# Patient Record
Sex: Female | Born: 1965
Health system: Southern US, Community
[De-identification: ages and names within clinical notes are randomized; demographics above are authoritative.]

## PROBLEM LIST (undated history)

## (undated) DIAGNOSIS — R002 Palpitations: Secondary | ICD-10-CM

## (undated) DIAGNOSIS — M419 Scoliosis, unspecified: Secondary | ICD-10-CM

## (undated) DIAGNOSIS — J302 Other seasonal allergic rhinitis: Secondary | ICD-10-CM

## (undated) DIAGNOSIS — F419 Anxiety disorder, unspecified: Secondary | ICD-10-CM

## (undated) DIAGNOSIS — D219 Benign neoplasm of connective and other soft tissue, unspecified: Secondary | ICD-10-CM

## (undated) DIAGNOSIS — D869 Sarcoidosis, unspecified: Secondary | ICD-10-CM

## (undated) DIAGNOSIS — I4510 Unspecified right bundle-branch block: Secondary | ICD-10-CM

## (undated) DIAGNOSIS — J45909 Unspecified asthma, uncomplicated: Secondary | ICD-10-CM

## (undated) DIAGNOSIS — I10 Essential (primary) hypertension: Secondary | ICD-10-CM

## (undated) DIAGNOSIS — G8929 Other chronic pain: Secondary | ICD-10-CM

## (undated) DIAGNOSIS — D573 Sickle-cell trait: Secondary | ICD-10-CM

## (undated) DIAGNOSIS — R0789 Other chest pain: Secondary | ICD-10-CM

## (undated) HISTORY — DX: Anxiety disorder, unspecified: F41.9

## (undated) HISTORY — DX: Other chest pain: R07.89

## (undated) HISTORY — PX: CHOLECYSTECTOMY: SHX55

## (undated) HISTORY — DX: Palpitations: R00.2

## (undated) HISTORY — PX: AXILLARY LYMPH NODE DISSECTION: SHX5229

## (undated) HISTORY — DX: Other chronic pain: G89.29

## (undated) HISTORY — DX: Unspecified right bundle-branch block: I45.10

---

## 2004-05-20 ENCOUNTER — Emergency Department (HOSPITAL_COMMUNITY): Admission: EM | Admit: 2004-05-20 | Discharge: 2004-05-20 | Payer: Self-pay | Admitting: Emergency Medicine

## 2004-05-23 ENCOUNTER — Ambulatory Visit: Payer: Self-pay | Admitting: Pulmonary Disease

## 2004-06-16 ENCOUNTER — Emergency Department (HOSPITAL_COMMUNITY): Admission: EM | Admit: 2004-06-16 | Discharge: 2004-06-16 | Payer: Self-pay | Admitting: Emergency Medicine

## 2004-10-30 ENCOUNTER — Emergency Department (HOSPITAL_COMMUNITY): Admission: EM | Admit: 2004-10-30 | Discharge: 2004-10-30 | Payer: Self-pay | Admitting: *Deleted

## 2004-11-27 ENCOUNTER — Encounter: Admission: RE | Admit: 2004-11-27 | Discharge: 2004-11-27 | Payer: Self-pay | Admitting: Internal Medicine

## 2005-01-05 ENCOUNTER — Emergency Department (HOSPITAL_COMMUNITY): Admission: EM | Admit: 2005-01-05 | Discharge: 2005-01-05 | Payer: Self-pay | Admitting: Emergency Medicine

## 2005-01-29 ENCOUNTER — Emergency Department (HOSPITAL_COMMUNITY): Admission: EM | Admit: 2005-01-29 | Discharge: 2005-01-29 | Payer: Self-pay | Admitting: Emergency Medicine

## 2005-07-31 ENCOUNTER — Emergency Department (HOSPITAL_COMMUNITY): Admission: EM | Admit: 2005-07-31 | Discharge: 2005-08-01 | Payer: Self-pay | Admitting: Emergency Medicine

## 2005-10-14 ENCOUNTER — Emergency Department (HOSPITAL_COMMUNITY): Admission: EM | Admit: 2005-10-14 | Discharge: 2005-10-14 | Payer: Self-pay | Admitting: Emergency Medicine

## 2006-09-09 ENCOUNTER — Emergency Department (HOSPITAL_COMMUNITY): Admission: EM | Admit: 2006-09-09 | Discharge: 2006-09-09 | Payer: Self-pay | Admitting: Emergency Medicine

## 2007-01-12 ENCOUNTER — Emergency Department (HOSPITAL_COMMUNITY): Admission: EM | Admit: 2007-01-12 | Discharge: 2007-01-12 | Payer: Self-pay | Admitting: Emergency Medicine

## 2007-05-20 ENCOUNTER — Emergency Department (HOSPITAL_COMMUNITY): Admission: EM | Admit: 2007-05-20 | Discharge: 2007-05-21 | Payer: Self-pay | Admitting: Emergency Medicine

## 2007-10-01 ENCOUNTER — Emergency Department (HOSPITAL_COMMUNITY): Admission: EM | Admit: 2007-10-01 | Discharge: 2007-10-01 | Payer: Self-pay | Admitting: Emergency Medicine

## 2007-11-30 ENCOUNTER — Encounter: Admission: RE | Admit: 2007-11-30 | Discharge: 2007-11-30 | Payer: Self-pay | Admitting: Obstetrics and Gynecology

## 2007-12-09 ENCOUNTER — Emergency Department (HOSPITAL_COMMUNITY): Admission: EM | Admit: 2007-12-09 | Discharge: 2007-12-09 | Payer: Self-pay | Admitting: Emergency Medicine

## 2007-12-09 ENCOUNTER — Ambulatory Visit (HOSPITAL_COMMUNITY): Admission: RE | Admit: 2007-12-09 | Discharge: 2007-12-09 | Payer: Self-pay | Admitting: Obstetrics and Gynecology

## 2008-10-07 ENCOUNTER — Emergency Department (HOSPITAL_COMMUNITY): Admission: EM | Admit: 2008-10-07 | Discharge: 2008-10-08 | Payer: Self-pay | Admitting: Emergency Medicine

## 2008-10-26 ENCOUNTER — Emergency Department (HOSPITAL_COMMUNITY): Admission: EM | Admit: 2008-10-26 | Discharge: 2008-10-27 | Payer: Self-pay | Admitting: Emergency Medicine

## 2008-10-28 ENCOUNTER — Emergency Department (HOSPITAL_COMMUNITY): Admission: EM | Admit: 2008-10-28 | Discharge: 2008-10-28 | Payer: Self-pay | Admitting: Emergency Medicine

## 2008-10-31 ENCOUNTER — Emergency Department (HOSPITAL_COMMUNITY): Admission: EM | Admit: 2008-10-31 | Discharge: 2008-10-31 | Payer: Self-pay | Admitting: Family Medicine

## 2008-12-07 ENCOUNTER — Encounter: Admission: RE | Admit: 2008-12-07 | Discharge: 2008-12-07 | Payer: Self-pay | Admitting: Internal Medicine

## 2009-03-30 ENCOUNTER — Emergency Department (HOSPITAL_COMMUNITY): Admission: EM | Admit: 2009-03-30 | Discharge: 2009-03-30 | Payer: Self-pay | Admitting: Emergency Medicine

## 2009-05-07 ENCOUNTER — Emergency Department (HOSPITAL_COMMUNITY): Admission: EM | Admit: 2009-05-07 | Discharge: 2009-05-07 | Payer: Self-pay | Admitting: Emergency Medicine

## 2009-05-12 ENCOUNTER — Observation Stay (HOSPITAL_COMMUNITY): Admission: EM | Admit: 2009-05-12 | Discharge: 2009-05-13 | Payer: Self-pay | Admitting: Emergency Medicine

## 2009-09-09 ENCOUNTER — Emergency Department (HOSPITAL_COMMUNITY): Admission: EM | Admit: 2009-09-09 | Discharge: 2009-09-09 | Payer: Self-pay | Admitting: Emergency Medicine

## 2009-10-03 ENCOUNTER — Emergency Department (HOSPITAL_COMMUNITY): Admission: EM | Admit: 2009-10-03 | Discharge: 2009-10-03 | Payer: Self-pay | Admitting: Emergency Medicine

## 2009-12-25 ENCOUNTER — Emergency Department (HOSPITAL_COMMUNITY): Admission: EM | Admit: 2009-12-25 | Discharge: 2009-12-25 | Payer: Self-pay | Admitting: Emergency Medicine

## 2010-01-25 ENCOUNTER — Emergency Department (HOSPITAL_COMMUNITY)
Admission: EM | Admit: 2010-01-25 | Discharge: 2010-01-25 | Payer: Self-pay | Source: Home / Self Care | Admitting: Emergency Medicine

## 2010-02-20 ENCOUNTER — Emergency Department (HOSPITAL_COMMUNITY)
Admission: EM | Admit: 2010-02-20 | Discharge: 2010-02-20 | Payer: Self-pay | Source: Home / Self Care | Admitting: Emergency Medicine

## 2010-03-03 ENCOUNTER — Encounter: Payer: Self-pay | Admitting: Internal Medicine

## 2010-03-04 ENCOUNTER — Encounter: Payer: Self-pay | Admitting: Obstetrics and Gynecology

## 2010-03-16 ENCOUNTER — Other Ambulatory Visit: Payer: Self-pay | Admitting: Internal Medicine

## 2010-03-16 DIAGNOSIS — Z1231 Encounter for screening mammogram for malignant neoplasm of breast: Secondary | ICD-10-CM

## 2010-03-20 ENCOUNTER — Ambulatory Visit: Payer: Self-pay

## 2010-03-23 ENCOUNTER — Ambulatory Visit: Payer: Self-pay

## 2010-03-26 ENCOUNTER — Ambulatory Visit: Payer: Self-pay

## 2010-04-03 ENCOUNTER — Ambulatory Visit: Payer: Self-pay

## 2010-04-24 LAB — POCT I-STAT, CHEM 8
BUN: 7 mg/dL (ref 6–23)
Calcium, Ion: 1.17 mmol/L (ref 1.12–1.32)
Chloride: 102 mEq/L (ref 96–112)
Creatinine, Ser: 0.9 mg/dL (ref 0.4–1.2)
Glucose, Bld: 80 mg/dL (ref 70–99)
HCT: 32 % — ABNORMAL LOW (ref 36.0–46.0)
Hemoglobin: 10.9 g/dL — ABNORMAL LOW (ref 12.0–15.0)
Potassium: 3.1 mEq/L — ABNORMAL LOW (ref 3.5–5.1)
Sodium: 138 mEq/L (ref 135–145)
TCO2: 25 mmol/L (ref 0–100)

## 2010-04-24 LAB — DIFFERENTIAL
Basophils Absolute: 0 10*3/uL (ref 0.0–0.1)
Basophils Relative: 0 % (ref 0–1)
Eosinophils Absolute: 0.3 10*3/uL (ref 0.0–0.7)
Eosinophils Relative: 3 % (ref 0–5)
Lymphocytes Relative: 27 % (ref 12–46)
Lymphs Abs: 2.5 10*3/uL (ref 0.7–4.0)
Monocytes Absolute: 0.7 10*3/uL (ref 0.1–1.0)
Monocytes Relative: 7 % (ref 3–12)
Neutro Abs: 5.9 10*3/uL (ref 1.7–7.7)
Neutrophils Relative %: 63 % (ref 43–77)

## 2010-04-24 LAB — CBC
HCT: 27.5 % — ABNORMAL LOW (ref 36.0–46.0)
Hemoglobin: 9.4 g/dL — ABNORMAL LOW (ref 12.0–15.0)
MCH: 22.6 pg — ABNORMAL LOW (ref 26.0–34.0)
MCHC: 34.2 g/dL (ref 30.0–36.0)
MCV: 66.1 fL — ABNORMAL LOW (ref 78.0–100.0)
Platelets: 299 10*3/uL (ref 150–400)
RBC: 4.16 MIL/uL (ref 3.87–5.11)
RDW: 16.5 % — ABNORMAL HIGH (ref 11.5–15.5)
WBC: 9.4 10*3/uL (ref 4.0–10.5)

## 2010-04-24 LAB — POCT CARDIAC MARKERS
CKMB, poc: <1 ng/mL — ABNORMAL LOW (ref 1.0–8.0)
Myoglobin, poc: 49.8 ng/mL (ref 12–200)
Troponin i, poc: <0.05 ng/mL (ref 0.00–0.09)

## 2010-04-24 LAB — D-DIMER, QUANTITATIVE: D-Dimer, Quant: 0.29 ug/mL-FEU (ref 0.00–0.48)

## 2010-04-27 LAB — URINALYSIS, ROUTINE W REFLEX MICROSCOPIC
Glucose, UA: NEGATIVE mg/dL
Hgb urine dipstick: NEGATIVE
Ketones, ur: NEGATIVE mg/dL
Nitrite: NEGATIVE
Protein, ur: NEGATIVE mg/dL
Specific Gravity, Urine: 1.023 (ref 1.005–1.030)
Urobilinogen, UA: 1 mg/dL (ref 0.0–1.0)
pH: 6 (ref 5.0–8.0)

## 2010-04-27 LAB — CBC
HCT: 34 % — ABNORMAL LOW (ref 36.0–46.0)
Hemoglobin: 11.5 g/dL — ABNORMAL LOW (ref 12.0–15.0)
MCH: 22.4 pg — ABNORMAL LOW (ref 26.0–34.0)
MCHC: 33.8 g/dL (ref 30.0–36.0)
MCV: 66.1 fL — ABNORMAL LOW (ref 78.0–100.0)
Platelets: 378 10*3/uL (ref 150–400)
RBC: 5.14 MIL/uL — ABNORMAL HIGH (ref 3.87–5.11)
RDW: 18.8 % — ABNORMAL HIGH (ref 11.5–15.5)
WBC: 9.6 10*3/uL (ref 4.0–10.5)

## 2010-04-27 LAB — DIFFERENTIAL
Basophils Absolute: 0 10*3/uL (ref 0.0–0.1)
Basophils Relative: 0 % (ref 0–1)
Eosinophils Absolute: 0.4 10*3/uL (ref 0.0–0.7)
Eosinophils Relative: 4 % (ref 0–5)
Lymphocytes Relative: 26 % (ref 12–46)
Lymphs Abs: 2.5 10*3/uL (ref 0.7–4.0)
Monocytes Absolute: 0.7 10*3/uL (ref 0.1–1.0)
Monocytes Relative: 7 % (ref 3–12)
Neutro Abs: 6 10*3/uL (ref 1.7–7.7)
Neutrophils Relative %: 63 % (ref 43–77)

## 2010-04-27 LAB — COMPREHENSIVE METABOLIC PANEL
ALT: 27 U/L (ref 0–35)
AST: 38 U/L — ABNORMAL HIGH (ref 0–37)
Albumin: 3.8 g/dL (ref 3.5–5.2)
Alkaline Phosphatase: 48 U/L (ref 39–117)
BUN: 15 mg/dL (ref 6–23)
CO2: 26 mEq/L (ref 19–32)
Calcium: 9.4 mg/dL (ref 8.4–10.5)
Chloride: 100 mEq/L (ref 96–112)
Creatinine, Ser: 1.22 mg/dL — ABNORMAL HIGH (ref 0.4–1.2)
GFR calc Af Amer: 59 mL/min — ABNORMAL LOW (ref >60)
GFR calc non Af Amer: 49 mL/min — ABNORMAL LOW (ref >60)
Glucose, Bld: 137 mg/dL — ABNORMAL HIGH (ref 70–99)
Potassium: 3.5 mEq/L (ref 3.5–5.1)
Sodium: 134 mEq/L — ABNORMAL LOW (ref 135–145)
Total Bilirubin: 0.4 mg/dL (ref 0.3–1.2)
Total Protein: 8 g/dL (ref 6.0–8.3)

## 2010-04-27 LAB — POCT PREGNANCY, URINE: Preg Test, Ur: NEGATIVE

## 2010-04-27 LAB — WET PREP, GENITAL
Trich, Wet Prep: NONE SEEN
Yeast Wet Prep HPF POC: NONE SEEN

## 2010-04-27 LAB — GC/CHLAMYDIA PROBE AMP, GENITAL
Chlamydia, DNA Probe: NEGATIVE
GC Probe Amp, Genital: NEGATIVE

## 2010-04-27 LAB — LIPASE, BLOOD: Lipase: 44 U/L (ref 11–59)

## 2010-04-28 LAB — CBC
HCT: 32.4 % — ABNORMAL LOW (ref 36.0–46.0)
Hemoglobin: 10.4 g/dL — ABNORMAL LOW (ref 12.0–15.0)
MCH: 22.3 pg — ABNORMAL LOW (ref 26.0–34.0)
MCHC: 32 g/dL (ref 30.0–36.0)
MCV: 69.7 fL — ABNORMAL LOW (ref 78.0–100.0)
Platelets: 302 10*3/uL (ref 150–400)
RBC: 4.64 MIL/uL (ref 3.87–5.11)
RDW: 19 % — ABNORMAL HIGH (ref 11.5–15.5)
WBC: 9.5 10*3/uL (ref 4.0–10.5)

## 2010-04-28 LAB — GLUCOSE, CAPILLARY: Glucose-Capillary: 93 mg/dL (ref 70–99)

## 2010-04-28 LAB — URINALYSIS, ROUTINE W REFLEX MICROSCOPIC
Bilirubin Urine: NEGATIVE
Glucose, UA: NEGATIVE mg/dL
Hgb urine dipstick: NEGATIVE
Ketones, ur: NEGATIVE mg/dL
Nitrite: NEGATIVE
Protein, ur: NEGATIVE mg/dL
Specific Gravity, Urine: 1.003 — ABNORMAL LOW (ref 1.005–1.030)
Urobilinogen, UA: 0.2 mg/dL (ref 0.0–1.0)
pH: 6.5 (ref 5.0–8.0)

## 2010-04-28 LAB — BASIC METABOLIC PANEL
BUN: 7 mg/dL (ref 6–23)
CO2: 25 mEq/L (ref 19–32)
Calcium: 9 mg/dL (ref 8.4–10.5)
Chloride: 106 mEq/L (ref 96–112)
Creatinine, Ser: 0.93 mg/dL (ref 0.4–1.2)
GFR calc Af Amer: >60 mL/min (ref >60)
GFR calc non Af Amer: >60 mL/min (ref >60)
Glucose, Bld: 111 mg/dL — ABNORMAL HIGH (ref 70–99)
Potassium: 4.6 mEq/L (ref 3.5–5.1)
Sodium: 136 mEq/L (ref 135–145)

## 2010-04-28 LAB — POCT CARDIAC MARKERS
CKMB, poc: <1 ng/mL — ABNORMAL LOW (ref 1.0–8.0)
CKMB, poc: <1 ng/mL — ABNORMAL LOW (ref 1.0–8.0)
Myoglobin, poc: 73.8 ng/mL (ref 12–200)
Myoglobin, poc: 90.2 ng/mL (ref 12–200)
Troponin i, poc: <0.05 ng/mL (ref 0.00–0.09)
Troponin i, poc: <0.05 ng/mL (ref 0.00–0.09)

## 2010-04-28 LAB — DIFFERENTIAL
Basophils Absolute: 0 10*3/uL (ref 0.0–0.1)
Basophils Relative: 0 % (ref 0–1)
Eosinophils Absolute: 0.4 10*3/uL (ref 0.0–0.7)
Eosinophils Relative: 4 % (ref 0–5)
Lymphocytes Relative: 19 % (ref 12–46)
Lymphs Abs: 1.8 10*3/uL (ref 0.7–4.0)
Monocytes Absolute: 0.6 10*3/uL (ref 0.1–1.0)
Monocytes Relative: 7 % (ref 3–12)
Neutro Abs: 6.6 10*3/uL (ref 1.7–7.7)
Neutrophils Relative %: 70 % (ref 43–77)

## 2010-05-02 LAB — BASIC METABOLIC PANEL
BUN: 3 mg/dL — ABNORMAL LOW (ref 6–23)
CO2: 27 mEq/L (ref 19–32)
Calcium: 8.8 mg/dL (ref 8.4–10.5)
Chloride: 106 mEq/L (ref 96–112)
Creatinine, Ser: 0.73 mg/dL (ref 0.4–1.2)
GFR calc Af Amer: >60 mL/min (ref >60)
GFR calc non Af Amer: >60 mL/min (ref >60)
Glucose, Bld: 96 mg/dL (ref 70–99)
Potassium: 3.9 mEq/L (ref 3.5–5.1)
Sodium: 139 mEq/L (ref 135–145)

## 2010-05-02 LAB — CBC
HCT: 30.8 % — ABNORMAL LOW (ref 36.0–46.0)
Hemoglobin: 10.1 g/dL — ABNORMAL LOW (ref 12.0–15.0)
MCHC: 32.8 g/dL (ref 30.0–36.0)
MCV: 71.4 fL — ABNORMAL LOW (ref 78.0–100.0)
Platelets: 290 10*3/uL (ref 150–400)
RBC: 4.32 MIL/uL (ref 3.87–5.11)
RDW: 18.3 % — ABNORMAL HIGH (ref 11.5–15.5)
WBC: 6.5 10*3/uL (ref 4.0–10.5)

## 2010-05-02 LAB — DIFFERENTIAL
Basophils Absolute: 0 10*3/uL (ref 0.0–0.1)
Basophils Relative: 1 % (ref 0–1)
Eosinophils Absolute: 0.4 10*3/uL (ref 0.0–0.7)
Eosinophils Relative: 7 % — ABNORMAL HIGH (ref 0–5)
Lymphocytes Relative: 27 % (ref 12–46)
Lymphs Abs: 1.7 10*3/uL (ref 0.7–4.0)
Monocytes Absolute: 0.4 10*3/uL (ref 0.1–1.0)
Monocytes Relative: 6 % (ref 3–12)
Neutro Abs: 3.9 10*3/uL (ref 1.7–7.7)
Neutrophils Relative %: 60 % (ref 43–77)

## 2010-05-02 LAB — POCT CARDIAC MARKERS
CKMB, poc: <1 ng/mL — ABNORMAL LOW (ref 1.0–8.0)
Myoglobin, poc: 75.8 ng/mL (ref 12–200)
Troponin i, poc: <0.05 ng/mL (ref 0.00–0.09)

## 2010-05-02 LAB — BRAIN NATRIURETIC PEPTIDE: Pro B Natriuretic peptide (BNP): 32 pg/mL (ref 0.0–100.0)

## 2010-05-18 LAB — CULTURE, ROUTINE-ABSCESS

## 2010-05-19 LAB — DIFFERENTIAL
Basophils Absolute: 0 10*3/uL (ref 0.0–0.1)
Basophils Relative: 0 % (ref 0–1)
Eosinophils Absolute: 0.3 10*3/uL (ref 0.0–0.7)
Eosinophils Relative: 4 % (ref 0–5)
Lymphocytes Relative: 22 % (ref 12–46)
Lymphs Abs: 1.9 10*3/uL (ref 0.7–4.0)
Monocytes Absolute: 0.4 10*3/uL (ref 0.1–1.0)
Monocytes Relative: 4 % (ref 3–12)
Neutro Abs: 6 10*3/uL (ref 1.7–7.7)
Neutrophils Relative %: 70 % (ref 43–77)

## 2010-05-19 LAB — BASIC METABOLIC PANEL
BUN: 9 mg/dL (ref 6–23)
CO2: 23 mEq/L (ref 19–32)
Calcium: 8.7 mg/dL (ref 8.4–10.5)
Chloride: 103 mEq/L (ref 96–112)
Creatinine, Ser: 1.1 mg/dL (ref 0.4–1.2)
GFR calc Af Amer: >60 mL/min (ref >60)
GFR calc non Af Amer: 55 mL/min — ABNORMAL LOW (ref >60)
Glucose, Bld: 125 mg/dL — ABNORMAL HIGH (ref 70–99)
Potassium: 3.3 mEq/L — ABNORMAL LOW (ref 3.5–5.1)
Sodium: 134 mEq/L — ABNORMAL LOW (ref 135–145)

## 2010-05-19 LAB — URINALYSIS, ROUTINE W REFLEX MICROSCOPIC
Bilirubin Urine: NEGATIVE
Glucose, UA: NEGATIVE mg/dL
Hgb urine dipstick: NEGATIVE
Ketones, ur: NEGATIVE mg/dL
Nitrite: NEGATIVE
Protein, ur: NEGATIVE mg/dL
Specific Gravity, Urine: 1.021 (ref 1.005–1.030)
Urobilinogen, UA: 1 mg/dL (ref 0.0–1.0)
pH: 6.5 (ref 5.0–8.0)

## 2010-05-19 LAB — URINE CULTURE
Colony Count: NO GROWTH
Culture: NO GROWTH

## 2010-05-19 LAB — URINE MICROSCOPIC-ADD ON

## 2010-05-19 LAB — RAPID URINE DRUG SCREEN, HOSP PERFORMED
Amphetamines: NOT DETECTED
Barbiturates: NOT DETECTED
Benzodiazepines: NOT DETECTED
Cocaine: NOT DETECTED
Opiates: NOT DETECTED
Tetrahydrocannabinol: POSITIVE — AB

## 2010-05-19 LAB — CBC
HCT: 30.8 % — ABNORMAL LOW (ref 36.0–46.0)
Hemoglobin: 10.2 g/dL — ABNORMAL LOW (ref 12.0–15.0)
MCHC: 33.1 g/dL (ref 30.0–36.0)
MCV: 71.5 fL — ABNORMAL LOW (ref 78.0–100.0)
Platelets: 321 10*3/uL (ref 150–400)
RBC: 4.31 MIL/uL (ref 3.87–5.11)
RDW: 17.9 % — ABNORMAL HIGH (ref 11.5–15.5)
WBC: 8.6 10*3/uL (ref 4.0–10.5)

## 2010-05-19 LAB — POCT CARDIAC MARKERS
CKMB, poc: <1 ng/mL — ABNORMAL LOW (ref 1.0–8.0)
Myoglobin, poc: 80.5 ng/mL (ref 12–200)
Troponin i, poc: <0.05 ng/mL (ref 0.00–0.09)

## 2010-05-19 LAB — PREGNANCY, URINE: Preg Test, Ur: NEGATIVE

## 2010-05-19 LAB — ETHANOL: Alcohol, Ethyl (B): 8 mg/dL (ref 0–10)

## 2010-07-19 ENCOUNTER — Emergency Department (HOSPITAL_COMMUNITY)
Admission: EM | Admit: 2010-07-19 | Discharge: 2010-07-20 | Discharge status: Against Medical Advice | Payer: Medicare Other | Attending: Emergency Medicine | Admitting: Emergency Medicine

## 2010-07-19 ENCOUNTER — Emergency Department (HOSPITAL_COMMUNITY): Payer: Medicare Other

## 2010-07-19 DIAGNOSIS — R0989 Other specified symptoms and signs involving the circulatory and respiratory systems: Secondary | ICD-10-CM | POA: Insufficient documentation

## 2010-07-19 DIAGNOSIS — R0609 Other forms of dyspnea: Secondary | ICD-10-CM | POA: Insufficient documentation

## 2010-11-06 LAB — URINALYSIS, ROUTINE W REFLEX MICROSCOPIC
Bilirubin Urine: NEGATIVE
Glucose, UA: NEGATIVE
Hgb urine dipstick: NEGATIVE
Ketones, ur: NEGATIVE
Nitrite: POSITIVE — AB
Protein, ur: NEGATIVE
Specific Gravity, Urine: 1.02
Urobilinogen, UA: 0.2
pH: 6

## 2010-11-06 LAB — DIFFERENTIAL
Basophils Absolute: 0.1
Basophils Relative: 1
Eosinophils Absolute: 0.3
Eosinophils Relative: 3
Lymphocytes Relative: 21
Lymphs Abs: 2
Monocytes Absolute: 0.5
Monocytes Relative: 5
Neutro Abs: 6.6
Neutrophils Relative %: 71

## 2010-11-06 LAB — BASIC METABOLIC PANEL
BUN: 4 — ABNORMAL LOW
CO2: 27
Calcium: 9.2
Chloride: 103
Creatinine, Ser: 0.85
GFR calc Af Amer: >60
GFR calc non Af Amer: >60
Glucose, Bld: 136 — ABNORMAL HIGH
Potassium: 3.1 — ABNORMAL LOW
Sodium: 137

## 2010-11-06 LAB — PREGNANCY, URINE: Preg Test, Ur: POSITIVE

## 2010-11-06 LAB — URINE MICROSCOPIC-ADD ON

## 2010-11-06 LAB — CBC
HCT: 34.1 — ABNORMAL LOW
Hemoglobin: 11.6 — ABNORMAL LOW
MCHC: 34.2
MCV: 77.2 — ABNORMAL LOW
Platelets: 304
RBC: 4.41
RDW: 15.5
WBC: 9.5

## 2010-11-06 LAB — URINE CULTURE: Colony Count: >=100000

## 2010-11-19 LAB — URINE MICROSCOPIC-ADD ON

## 2010-11-19 LAB — URINALYSIS, ROUTINE W REFLEX MICROSCOPIC
Bilirubin Urine: NEGATIVE
Glucose, UA: NEGATIVE
Hgb urine dipstick: NEGATIVE
Ketones, ur: NEGATIVE
Nitrite: NEGATIVE
Protein, ur: NEGATIVE
Specific Gravity, Urine: 1.024
Urobilinogen, UA: 1
pH: 6.5

## 2010-11-26 LAB — POCT CARDIAC MARKERS
CKMB, poc: <1 — ABNORMAL LOW
CKMB, poc: <1 — ABNORMAL LOW
Myoglobin, poc: 49.2
Myoglobin, poc: 52.5
Operator id: 4295
Operator id: 4661
Troponin i, poc: <0.05
Troponin i, poc: <0.05

## 2010-11-26 LAB — BASIC METABOLIC PANEL
BUN: 4 — ABNORMAL LOW
CO2: 25
Calcium: 9.2
Chloride: 103
Creatinine, Ser: 0.67
GFR calc Af Amer: >60
GFR calc non Af Amer: >60
Glucose, Bld: 79
Potassium: 3.8
Sodium: 136

## 2010-11-26 LAB — CBC
HCT: 34.3 — ABNORMAL LOW
Hemoglobin: 11.7 — ABNORMAL LOW
MCHC: 34.2
MCV: 74.2 — ABNORMAL LOW
Platelets: 319
RBC: 4.62
RDW: 17.5 — ABNORMAL HIGH
WBC: 8.7

## 2010-11-26 LAB — DIFFERENTIAL
Basophils Absolute: 0
Basophils Relative: 0
Eosinophils Absolute: 0.4
Eosinophils Relative: 4
Lymphocytes Relative: 21
Lymphs Abs: 1.8
Monocytes Absolute: 0.5
Monocytes Relative: 6
Neutro Abs: 6
Neutrophils Relative %: 69

## 2010-11-26 LAB — D-DIMER, QUANTITATIVE: D-Dimer, Quant: 0.82 — ABNORMAL HIGH

## 2011-04-17 ENCOUNTER — Emergency Department (INDEPENDENT_AMBULATORY_CARE_PROVIDER_SITE_OTHER)
Admission: EM | Admit: 2011-04-17 | Discharge: 2011-04-17 | Disposition: A | Discharge status: Home Health | Payer: Medicare Other | Source: Home / Self Care | Attending: Family Medicine | Admitting: Family Medicine

## 2011-04-17 ENCOUNTER — Encounter (HOSPITAL_COMMUNITY): Payer: Self-pay | Admitting: Emergency Medicine

## 2011-04-17 DIAGNOSIS — N760 Acute vaginitis: Secondary | ICD-10-CM | POA: Diagnosis not present

## 2011-04-17 HISTORY — DX: Essential (primary) hypertension: I10

## 2011-04-17 HISTORY — DX: Sarcoidosis, unspecified: D86.9

## 2011-04-17 LAB — POCT URINALYSIS DIP (DEVICE)
Bilirubin Urine: NEGATIVE
Glucose, UA: NEGATIVE mg/dL
Ketones, ur: NEGATIVE mg/dL
Nitrite: NEGATIVE
Protein, ur: 30 mg/dL — AB
Specific Gravity, Urine: 1.015 (ref 1.005–1.030)
Urobilinogen, UA: 0.2 mg/dL (ref 0.0–1.0)
pH: 7 (ref 5.0–8.0)

## 2011-04-17 LAB — POCT PREGNANCY, URINE: Preg Test, Ur: NEGATIVE

## 2011-04-17 MED ORDER — FLUCONAZOLE 150 MG PO TABS: 150.0000 mg | ORAL_TABLET | Freq: Once | ORAL | Status: AC

## 2011-04-17 NOTE — ED Provider Notes (Signed)
History     CSN: [id number]  Arrival date & time 04/17/11  4098   First MD Initiated Contact with Patient 04/17/11 1024      Chief Complaint  Patient presents with  . Vaginal Discharge  . Abdominal Pain    (Consider location/radiation/quality/duration/timing/severity/associated sxs/prior treatment) HPI Comments: Crystal Bullock presents for evaluation of yellow, thick discharge, and vaginal irritation with itching. She reports a long history of skin infections and abscesses for which he takes antibiotics intermittently. She's recently been on intermittent doses of Augmentin since January. She reports that she is taken amoxicillin in the past, but never Augmentin. She thinks the Augmentin is too strong for her. She also now reports skin itching in addition to the vaginal irritation. Because of this. She was recently changed over to doxycycline for abscess on her left breast currently. She denies any fever. No dyspnea.  Patient is a 46 y.o. female presenting with female genitourinary complaint. The history is provided by the patient.  Female GU Problem Primary symptoms include discharge and genital itching.  Primary symptoms include no dysuria and no vaginal bleeding. There has been no fever. This is a recurrent problem. The current episode started more than 1 week ago. The problem occurs constantly. The problem has not changed since onset.She is not pregnant. The discharge was white and thick. Pertinent negatives include no frequency. Sexual activity: non-contributory. There is no concern regarding sexually transmitted diseases.    Past Medical History  Diagnosis Date  . Hypertension   . Sarcoidosis     Past Surgical History  Procedure Date  . Axillary lymph node dissection     No family history on file.  History  Substance Use Topics  . Smoking status: Not on file  . Smokeless tobacco: Not on file  . Alcohol Use:     OB History    Grav Para Term Preterm Abortions TAB SAB Ect Mult  Living                  Review of Systems  Constitutional: Negative.   HENT: Negative.   Eyes: Negative.   Respiratory: Negative.   Cardiovascular: Negative.   Gastrointestinal: Negative.   Genitourinary: Positive for vaginal discharge. Negative for dysuria, urgency, frequency, vaginal bleeding and vaginal pain.  Musculoskeletal: Negative.   Skin: Negative.   Neurological: Negative.     Allergies  Review of patient's allergies indicates no known allergies.  Home Medications   Current Outpatient Rx  Name Route Sig Dispense Refill  . AMOXICILLIN-POT CLAVULANATE 875-125 MG PO TABS Oral Take 1 tablet by mouth 2 (two) times daily.    Marland Kitchen HYDROCODONE-ACETAMINOPHEN 10-325 MG PO TABS Oral Take 1 tablet by mouth every 6 (six) hours as needed.    Marland Kitchen OLMESARTAN MEDOXOMIL 40 MG PO TABS Oral Take 40 mg by mouth daily.    Marland Kitchen FLUCONAZOLE 150 MG PO TABS Oral Take 1 tablet (150 mg total) by mouth once. Take one pill once. May repeat if symptoms persist after 3rd day. 2 tablet 2    BP 145/108  Pulse 74  Temp(Src) 98.8 F (37.1 C) (Oral)  Resp 16  SpO2 100%  LMP 04/10/2011  Physical Exam  Nursing note and vitals reviewed. Constitutional: She is oriented to person, place, and time. She appears well-developed and well-nourished.  HENT:  Head: Normocephalic and atraumatic.  Eyes: EOM are normal.  Neck: Normal range of motion.  Pulmonary/Chest: Effort normal.  Musculoskeletal: Normal range of motion.  Neurological: She is alert and oriented  to person, place, and time.  Skin: Skin is warm and dry.  Psychiatric: Her behavior is normal.    ED Course  Procedures (including critical care time)  Labs Reviewed  POCT URINALYSIS DIP (DEVICE) - Abnormal; Notable for the following:    Hgb urine dipstick TRACE (*)    Protein, ur 30 (*)    Leukocytes, UA LARGE (*) Biochemical Testing Only. Please order routine urinalysis from main lab if confirmatory testing is needed.   All other components  within normal limits  POCT PREGNANCY, URINE   No results found.   1. Vaginitis       MDM  Symptoms consistent with yeast infection; given rx for Diflucan; return if symptoms do not improve        Richardo Priest, MD 04/17/11 1136

## 2011-04-17 NOTE — Discharge Instructions (Signed)
Take medications as directed. If symptoms do not improve, please return to care. Continue antibiotics as discussed. Apply warm compresses to affected area. Return to care should your symptoms not improve, or worsen in any way such as fever, increased pain, or new symptoms.

## 2011-04-17 NOTE — ED Notes (Signed)
PT HERE WITH SX VAG YELLOW D/C WITH ODOR,LOWER L ABD PAIN RADIATING TO BACK THAT RESTARTED Monday.PT S/P YEAST INFECTION FROM TAKING PRESCRIBED AMOX-CLAV 875 MG GIVEN BY PCP AND RELIEVED AFTER DIFLUCAN AND MONISTAT TREATMENT.PT WAS PRESCRIBED ATB S/P FREQ BOILS.PT THINKS ATB TO STRONG BECAUSE ITS MAKING HER FEEL SICK AND STATES SOMETHING NOT RIGHT.LMP X 1WEEK AGO.

## 2011-04-24 ENCOUNTER — Emergency Department (HOSPITAL_COMMUNITY): Payer: Medicare Other

## 2011-04-24 ENCOUNTER — Emergency Department (HOSPITAL_COMMUNITY)
Admission: EM | Admit: 2011-04-24 | Discharge: 2011-04-24 | Disposition: A | Discharge status: Home Health | Payer: Medicare Other | Attending: Emergency Medicine | Admitting: Emergency Medicine

## 2011-04-24 ENCOUNTER — Encounter (HOSPITAL_COMMUNITY): Payer: Self-pay | Admitting: Emergency Medicine

## 2011-04-24 DIAGNOSIS — A599 Trichomoniasis, unspecified: Secondary | ICD-10-CM | POA: Diagnosis not present

## 2011-04-24 DIAGNOSIS — N12 Tubulo-interstitial nephritis, not specified as acute or chronic: Secondary | ICD-10-CM | POA: Insufficient documentation

## 2011-04-24 DIAGNOSIS — D869 Sarcoidosis, unspecified: Secondary | ICD-10-CM | POA: Insufficient documentation

## 2011-04-24 DIAGNOSIS — R109 Unspecified abdominal pain: Secondary | ICD-10-CM | POA: Diagnosis not present

## 2011-04-24 DIAGNOSIS — R509 Fever, unspecified: Secondary | ICD-10-CM | POA: Diagnosis not present

## 2011-04-24 DIAGNOSIS — R112 Nausea with vomiting, unspecified: Secondary | ICD-10-CM | POA: Insufficient documentation

## 2011-04-24 DIAGNOSIS — N898 Other specified noninflammatory disorders of vagina: Secondary | ICD-10-CM | POA: Diagnosis not present

## 2011-04-24 DIAGNOSIS — I10 Essential (primary) hypertension: Secondary | ICD-10-CM | POA: Diagnosis not present

## 2011-04-24 DIAGNOSIS — Z79899 Other long term (current) drug therapy: Secondary | ICD-10-CM | POA: Diagnosis not present

## 2011-04-24 DIAGNOSIS — N10 Acute tubulo-interstitial nephritis: Secondary | ICD-10-CM | POA: Diagnosis not present

## 2011-04-24 DIAGNOSIS — K5730 Diverticulosis of large intestine without perforation or abscess without bleeding: Secondary | ICD-10-CM | POA: Diagnosis not present

## 2011-04-24 HISTORY — DX: Benign neoplasm of connective and other soft tissue, unspecified: D21.9

## 2011-04-24 LAB — URINALYSIS, ROUTINE W REFLEX MICROSCOPIC
Bilirubin Urine: NEGATIVE
Glucose, UA: NEGATIVE mg/dL
Hgb urine dipstick: NEGATIVE
Ketones, ur: NEGATIVE mg/dL
Nitrite: NEGATIVE
Protein, ur: 30 mg/dL — AB
Specific Gravity, Urine: 1.024 (ref 1.005–1.030)
Urobilinogen, UA: 0.2 mg/dL (ref 0.0–1.0)
pH: 7 (ref 5.0–8.0)

## 2011-04-24 LAB — BASIC METABOLIC PANEL
BUN: 5 mg/dL — ABNORMAL LOW (ref 6–23)
CO2: 24 mEq/L (ref 19–32)
Calcium: 9.5 mg/dL (ref 8.4–10.5)
Chloride: 102 mEq/L (ref 96–112)
Creatinine, Ser: 0.84 mg/dL (ref 0.50–1.10)
GFR calc Af Amer: >90 mL/min (ref >90)
GFR calc non Af Amer: 84 mL/min — ABNORMAL LOW (ref >90)
Glucose, Bld: 87 mg/dL (ref 70–99)
Potassium: 3.7 mEq/L (ref 3.5–5.1)
Sodium: 136 mEq/L (ref 135–145)

## 2011-04-24 LAB — CBC
HCT: 30.1 % — ABNORMAL LOW (ref 36.0–46.0)
Hemoglobin: 10.2 g/dL — ABNORMAL LOW (ref 12.0–15.0)
MCH: 22.1 pg — ABNORMAL LOW (ref 26.0–34.0)
MCHC: 33.9 g/dL (ref 30.0–36.0)
MCV: 65.2 fL — ABNORMAL LOW (ref 78.0–100.0)
Platelets: 342 10*3/uL (ref 150–400)
RBC: 4.62 MIL/uL (ref 3.87–5.11)
RDW: 18.5 % — ABNORMAL HIGH (ref 11.5–15.5)
WBC: 13.1 10*3/uL — ABNORMAL HIGH (ref 4.0–10.5)

## 2011-04-24 LAB — URINE MICROSCOPIC-ADD ON

## 2011-04-24 LAB — WET PREP, GENITAL
Clue Cells Wet Prep HPF POC: NONE SEEN
Yeast Wet Prep HPF POC: NONE SEEN

## 2011-04-24 MED ORDER — SODIUM CHLORIDE 0.9 % IV SOLN: INTRAVENOUS | Status: DC

## 2011-04-24 MED ORDER — METRONIDAZOLE 500 MG PO TABS
2000.0000 mg | ORAL_TABLET | Freq: Once | ORAL | Status: AC
  Administered 2011-04-24: 2000 mg via ORAL
  Filled 2011-04-24: qty 4

## 2011-04-24 MED ORDER — SODIUM CHLORIDE 0.9 % IV BOLUS (SEPSIS)
250.0000 mL | Freq: Once | INTRAVENOUS | Status: AC
  Administered 2011-04-24: 1000 mL via INTRAVENOUS

## 2011-04-24 MED ORDER — HYDROMORPHONE HCL PF 1 MG/ML IJ SOLN
1.0000 mg | Freq: Once | INTRAMUSCULAR | Status: AC
  Administered 2011-04-24: 1 mg via INTRAVENOUS
  Filled 2011-04-24: qty 1

## 2011-04-24 MED ORDER — CEPHALEXIN 500 MG PO CAPS: 500.0000 mg | ORAL_CAPSULE | Freq: Four times a day (QID) | ORAL | Status: AC

## 2011-04-24 MED ORDER — ONDANSETRON HCL 4 MG/2ML IJ SOLN
4.0000 mg | Freq: Once | INTRAMUSCULAR | Status: AC
  Administered 2011-04-24: 4 mg via INTRAVENOUS
  Filled 2011-04-24: qty 2

## 2011-04-24 MED ORDER — IOHEXOL 300 MG/ML  SOLN: 20.0000 mL | INTRAMUSCULAR | Status: AC

## 2011-04-24 MED ORDER — DEXTROSE 5 % IV SOLN
1.0000 g | Freq: Once | INTRAVENOUS | Status: AC
  Administered 2011-04-24: 1 g via INTRAVENOUS
  Filled 2011-04-24: qty 10

## 2011-04-24 MED ORDER — HYDROCODONE-ACETAMINOPHEN 5-325 MG PO TABS: 1.0000 | ORAL_TABLET | Freq: Four times a day (QID) | ORAL | Status: AC | PRN

## 2011-04-24 NOTE — ED Notes (Signed)
Pt drinking contrast at this time for CT, family at bedside.

## 2011-04-24 NOTE — ED Provider Notes (Addendum)
History     CSN: [id number]  Arrival date & time 04/24/11  0945   First MD Initiated Contact with Patient 04/24/11 1413      Chief Complaint  Patient presents with  . Abdominal Pain    (Consider location/radiation/quality/duration/timing/severity/associated sxs/prior treatment) Patient is a 46 y.o. female presenting with abdominal pain. The history is provided by the patient.  Abdominal Pain The primary symptoms of the illness include abdominal pain, fever, nausea, vomiting and vaginal discharge. The primary symptoms of the illness do not include fatigue, shortness of breath, diarrhea, hematemesis or dysuria. The current episode started more than 2 days ago. The onset of the illness was sudden. The problem has not changed since onset. The vaginal discharge is not associated with dysuria.   Symptoms associated with the illness do not include back pain.   Donald pain is bilateral lower cords and present for 1 week associated with nausea vomiting and fever for the past 3 days patient was seen at the urgent care 1 week ago treated with Diflucan did not have pelvic exam urinalysis and was chest about possible infection patient was already on doxycycline still taking it urine pregnancy test and was negative.   Past Medical History  Diagnosis Date  . Hypertension   . Sarcoidosis   . Fibroid     Past Surgical History  Procedure Date  . Axillary lymph node dissection     No family history on file.  History  Substance Use Topics  . Smoking status: Current Some Day Smoker  . Smokeless tobacco: Not on file  . Alcohol Use: Yes    OB History    Grav Para Term Preterm Abortions TAB SAB Ect Mult Living                  Review of Systems  Constitutional: Positive for fever. Negative for fatigue.  HENT: Negative for neck pain.   Eyes: Negative for redness and visual disturbance.  Respiratory: Negative for cough and shortness of breath.   Cardiovascular: Negative for chest pain.    Gastrointestinal: Positive for nausea, vomiting and abdominal pain. Negative for diarrhea and hematemesis.  Genitourinary: Positive for vaginal discharge. Negative for dysuria.  Musculoskeletal: Negative for back pain.  Skin: Negative for rash.  Neurological: Negative for headaches.  Hematological: Does not bruise/bleed easily.    Allergies  Ivp dye  Home Medications   Current Outpatient Rx  Name Route Sig Dispense Refill  . AMOXICILLIN-POT CLAVULANATE 875-125 MG PO TABS Oral Take 1 tablet by mouth 2 (two) times daily as needed. For flare ups    . VITAMIN C 1000 MG PO TABS Oral Take 1,000 mg by mouth daily.    Marland Kitchen DOXYCYCLINE HYCLATE 100 MG PO TABS Oral Take 100 mg by mouth 2 (two) times daily as needed. For flare ups    . HYDROCODONE-ACETAMINOPHEN 10-325 MG PO TABS Oral Take 1 tablet by mouth every 6 (six) hours as needed. For pain    . THERA M PLUS PO TABS Oral Take 1 tablet by mouth daily.    Marland Kitchen OLMESARTAN MEDOXOMIL 40 MG PO TABS Oral Take 40 mg by mouth daily.    . CEPHALEXIN 500 MG PO CAPS Oral Take 1 capsule (500 mg total) by mouth 4 (four) times daily. 40 capsule 0  . HYDROCODONE-ACETAMINOPHEN 5-325 MG PO TABS Oral Take 1-2 tablets by mouth every 6 (six) hours as needed for pain. 10 tablet 0    BP 171/105  Pulse 83  Temp(Src)  98.4 F (36.9 C) (Oral)  Resp 18  SpO2 97%  LMP 04/10/2011  Physical Exam  Nursing note and vitals reviewed. Constitutional: She appears well-developed and well-nourished. No distress.  HENT:  Head: Normocephalic and atraumatic.  Mouth/Throat: Oropharynx is clear and moist.  Eyes: Conjunctivae and EOM are normal. Pupils are equal, round, and reactive to light.  Neck: Normal range of motion. Neck supple.  Cardiovascular: Normal rate, regular rhythm and normal heart sounds.   No murmur heard. Pulmonary/Chest: Effort normal and breath sounds normal. No respiratory distress.  Abdominal: Soft. Bowel sounds are normal. There is no tenderness.   Genitourinary: Uterus normal. Vaginal discharge found.       Patient with purulent vaginal discharge no cervical motion tenderness no uterine tenderness no adnexal tenderness  Musculoskeletal: Normal range of motion.  Neurological: She is alert. No cranial nerve deficit. She exhibits normal muscle tone.  Skin: Skin is warm. No rash noted.    ED Course  Procedures (including critical care time)  Labs Reviewed  URINALYSIS, ROUTINE W REFLEX MICROSCOPIC - Abnormal; Notable for the following:    APPearance CLOUDY (*)    Protein, ur 30 (*)    Leukocytes, UA LARGE (*)    All other components within normal limits  URINE MICROSCOPIC-ADD ON - Abnormal; Notable for the following:    Squamous Epithelial / LPF FEW (*)    Bacteria, UA FEW (*)    All other components within normal limits  CBC - Abnormal; Notable for the following:    WBC 13.1 (*)    Hemoglobin 10.2 (*)    HCT 30.1 (*)    MCV 65.2 (*)    MCH 22.1 (*)    RDW 18.5 (*)    All other components within normal limits  BASIC METABOLIC PANEL - Abnormal; Notable for the following:    BUN 5 (*)    GFR calc non Af Amer 84 (*)    All other components within normal limits  WET PREP, GENITAL - Abnormal; Notable for the following:    Trich, Wet Prep FEW (*)    WBC, Wet Prep HPF POC TOO NUMEROUS TO COUNT (*)    All other components within normal limits  URINE CULTURE  GC/CHLAMYDIA PROBE AMP, GENITAL   Ct Abdomen Pelvis Wo Contrast  04/24/2011  *RADIOLOGY REPORT*  Clinical Data: Lower abdominal and flank pain.  CT ABDOMEN AND PELVIS WITHOUT CONTRAST  Technique:  Multidetector CT imaging of the abdomen and pelvis was performed following the standard protocol without intravenous contrast.  Comparison: 05/21/2007  Findings: Visualized lung bases clear.  Vascular clips in the gallbladder fossa.  Unremarkable uninfused evaluation of the liver, spleen, adrenal glands, kidneys, pancreas, abdominal aorta, stomach, small bowel, appendix. No  nephrolithiasis or hydronephrosis.  There   are a few scattered distal descending and sigmoid diverticula; no adjacent inflammatory/edematous change. Uterus and adnexal regions unremarkable.  Urinary bladder is incompletely distended.  Stable right pelvic phlebolith.  Tubal ligation clips are noted.  No ascites.  No free air.  A few sub centimeter left para-aortic, aortocaval, and central mesenteric nodes are identified, decreased in prominence since previous exam. Lumbar spine intact.  IMPRESSION:  1.  Negative for nephrolithiasis or ureteral calculus. 2.  Normal appendix. 3.  A few scattered descending and sigmoid diverticula.  Original Report Authenticated By: Osa Craver, M.D.   Results for orders placed during the hospital encounter of 04/24/11  URINALYSIS, ROUTINE W REFLEX MICROSCOPIC      Component  Value Range   Color, Urine YELLOW  YELLOW    APPearance CLOUDY (*) CLEAR    Specific Gravity, Urine 1.024  1.005 - 1.030    pH 7.0  5.0 - 8.0    Glucose, UA NEGATIVE  NEGATIVE (mg/dL)   Hgb urine dipstick NEGATIVE  NEGATIVE    Bilirubin Urine NEGATIVE  NEGATIVE    Ketones, ur NEGATIVE  NEGATIVE (mg/dL)   Protein, ur 30 (*) NEGATIVE (mg/dL)   Urobilinogen, UA 0.2  0.0 - 1.0 (mg/dL)   Nitrite NEGATIVE  NEGATIVE    Leukocytes, UA LARGE (*) NEGATIVE   URINE MICROSCOPIC-ADD ON      Component Value Range   Squamous Epithelial / LPF FEW (*) RARE    WBC, UA 21-50  <3 (WBC/hpf)   RBC / HPF 0-2  <3 (RBC/hpf)   Bacteria, UA FEW (*) RARE    Urine-Other MUCOUS PRESENT    CBC      Component Value Range   WBC 13.1 (*) 4.0 - 10.5 (K/uL)   RBC 4.62  3.87 - 5.11 (MIL/uL)   Hemoglobin 10.2 (*) 12.0 - 15.0 (g/dL)   HCT 16.1 (*) 09.6 - 46.0 (%)   MCV 65.2 (*) 78.0 - 100.0 (fL)   MCH 22.1 (*) 26.0 - 34.0 (pg)   MCHC 33.9  30.0 - 36.0 (g/dL)   RDW 04.5 (*) 40.9 - 15.5 (%)   Platelets 342  150 - 400 (K/uL)  BASIC METABOLIC PANEL      Component Value Range   Sodium 136  135 - 145 (mEq/L)    Potassium 3.7  3.5 - 5.1 (mEq/L)   Chloride 102  96 - 112 (mEq/L)   CO2 24  19 - 32 (mEq/L)   Glucose, Bld 87  70 - 99 (mg/dL)   BUN 5 (*) 6 - 23 (mg/dL)   Creatinine, Ser 8.11  0.50 - 1.10 (mg/dL)   Calcium 9.5  8.4 - 91.4 (mg/dL)   GFR calc non Af Amer 84 (*) >90 (mL/min)   GFR calc Af Amer >90  >90 (mL/min)  WET PREP, GENITAL      Component Value Range   Yeast Wet Prep HPF POC NONE SEEN  NONE SEEN    Trich, Wet Prep FEW (*) NONE SEEN    Clue Cells Wet Prep HPF POC NONE SEEN  NONE SEEN    WBC, Wet Prep HPF POC TOO NUMEROUS TO COUNT (*) NONE SEEN    Results for orders placed during the hospital encounter of 04/24/11  URINALYSIS, ROUTINE W REFLEX MICROSCOPIC      Component Value Range   Color, Urine YELLOW  YELLOW    APPearance CLOUDY (*) CLEAR    Specific Gravity, Urine 1.024  1.005 - 1.030    pH 7.0  5.0 - 8.0    Glucose, UA NEGATIVE  NEGATIVE (mg/dL)   Hgb urine dipstick NEGATIVE  NEGATIVE    Bilirubin Urine NEGATIVE  NEGATIVE    Ketones, ur NEGATIVE  NEGATIVE (mg/dL)   Protein, ur 30 (*) NEGATIVE (mg/dL)   Urobilinogen, UA 0.2  0.0 - 1.0 (mg/dL)   Nitrite NEGATIVE  NEGATIVE    Leukocytes, UA LARGE (*) NEGATIVE   URINE MICROSCOPIC-ADD ON      Component Value Range   Squamous Epithelial / LPF FEW (*) RARE    WBC, UA 21-50  <3 (WBC/hpf)   RBC / HPF 0-2  <3 (RBC/hpf)   Bacteria, UA FEW (*) RARE    Urine-Other MUCOUS PRESENT  CBC      Component Value Range   WBC 13.1 (*) 4.0 - 10.5 (K/uL)   RBC 4.62  3.87 - 5.11 (MIL/uL)   Hemoglobin 10.2 (*) 12.0 - 15.0 (g/dL)   HCT 09.8 (*) 11.9 - 46.0 (%)   MCV 65.2 (*) 78.0 - 100.0 (fL)   MCH 22.1 (*) 26.0 - 34.0 (pg)   MCHC 33.9  30.0 - 36.0 (g/dL)   RDW 14.7 (*) 82.9 - 15.5 (%)   Platelets 342  150 - 400 (K/uL)  BASIC METABOLIC PANEL      Component Value Range   Sodium 136  135 - 145 (mEq/L)   Potassium 3.7  3.5 - 5.1 (mEq/L)   Chloride 102  96 - 112 (mEq/L)   CO2 24  19 - 32 (mEq/L)   Glucose, Bld 87  70 - 99 (mg/dL)    BUN 5 (*) 6 - 23 (mg/dL)   Creatinine, Ser 5.62  0.50 - 1.10 (mg/dL)   Calcium 9.5  8.4 - 13.0 (mg/dL)   GFR calc non Af Amer 84 (*) >90 (mL/min)   GFR calc Af Amer >90  >90 (mL/min)  WET PREP, GENITAL      Component Value Range   Yeast Wet Prep HPF POC NONE SEEN  NONE SEEN    Trich, Wet Prep FEW (*) NONE SEEN    Clue Cells Wet Prep HPF POC NONE SEEN  NONE SEEN    WBC, Wet Prep HPF POC TOO NUMEROUS TO COUNT (*) NONE SEEN      1. Pyelonephritis   2. Abdominal pain   3. Trichimoniasis       MDM  Patient seen in urgent care on March 6 treated with Diflucan for yeast patient states no pelvic exam was done urine pregnancy test was negative urinalysis then was not as suggestive of urinary tract infection as 2 days but was not completely normal. Patient with persistent bilateral lower quadrant abdominal pain since then prepped past 3 days his had fevers feeling sweats nausea and vomiting this may be consistent with pyelonephritis urine culture done patient currently on doxycycline we'll give IV Rocephin 1 g moved to CDU get CT abdomen as well as do pelvic completed. Based on symptoms that we know so far with the abnormal urine patient at least has pyelonephritis.   Patient was to be moved to CDU but no CDU provider so patient remained under my care. CT scan negative white count slightly elevated pelvic exam shows marked purulent discharge but no cervical motion tenderness no uterine tenderness no adnexal tenderness. As well patient has been taking doxycycline for the past 2 weeks she takes this on a regular basis for frequent MRSA infections.  Due to the concern for the pyelonephritis or urinary tract infection which could be contamination from the purulent vaginal discharge patient did receive 1 g of Rocephin this would also cover STDs but do not believe the patient has PID based on her physical findings. Will await the wet prep and then discharge covering her for potential pyelonephritis and  potential STD which is oriented really been done with the IV Rocephin since she is oriented doxycycline. May require followup with GYN.        Shelda Jakes, MD 04/24/11 1456    Shelda Jakes, MD 04/24/11 2000

## 2011-04-24 NOTE — Discharge Instructions (Signed)
Take antibiotic as directed also take hydrocodone as needed for pain. Trichomonas was treated in the emergency department with a 2 g of Flagyl that she got. Other concerns are for kidney infection or severe urinary tract infection the antibiotic Keflex is for that you should be better in 2 days if you're not she needs to be followed up here or at urgent care or return for any new or worse symptoms. Sexual partner needs to be treated for Trichomonas as well. Cultures for other STDs are pending he will be called if they are positive.

## 2011-04-24 NOTE — ED Notes (Signed)
Pt reports she is on antibiotics for re-current skin infections, currently being treated for one under left breast (abscess noted with no drainage or redness). States she has had lower abdominal pain radiating to lower abdomen with yeast infection since starting antibiotics.

## 2011-04-24 NOTE — ED Notes (Signed)
Onset two weeks ago states seen at urgent care for abscess and given antibiotics. Since taking the medication LLQ and RLQ cramping pressure radiating to bilateral flank pain with odor in urine denies dysuria. Pain 6/10.

## 2011-04-24 NOTE — ED Notes (Signed)
Pt finished contrast. CT made aware.

## 2011-04-25 LAB — URINE CULTURE
Colony Count: >=100000
Culture  Setup Time: 201303131449

## 2011-04-25 LAB — GC/CHLAMYDIA PROBE AMP, GENITAL
Chlamydia, DNA Probe: NEGATIVE
GC Probe Amp, Genital: NEGATIVE

## 2011-04-26 NOTE — ED Notes (Signed)
+   urine Chart sent to EDP office for review. 

## 2011-04-28 NOTE — ED Notes (Signed)
CHart back from EDP office.. Continue with treatment

## 2012-09-15 ENCOUNTER — Emergency Department (HOSPITAL_COMMUNITY): Payer: Medicare Other

## 2012-09-15 ENCOUNTER — Encounter (HOSPITAL_COMMUNITY): Payer: Self-pay | Admitting: Emergency Medicine

## 2012-09-15 ENCOUNTER — Emergency Department (HOSPITAL_COMMUNITY)
Admission: EM | Admit: 2012-09-15 | Discharge: 2012-09-16 | Disposition: A | Discharge status: Home / Self Care | Payer: Medicare Other | Attending: Emergency Medicine | Admitting: Emergency Medicine

## 2012-09-15 DIAGNOSIS — Z79899 Other long term (current) drug therapy: Secondary | ICD-10-CM | POA: Diagnosis not present

## 2012-09-15 DIAGNOSIS — M545 Low back pain, unspecified: Secondary | ICD-10-CM | POA: Diagnosis not present

## 2012-09-15 DIAGNOSIS — I10 Essential (primary) hypertension: Secondary | ICD-10-CM | POA: Diagnosis not present

## 2012-09-15 DIAGNOSIS — R0602 Shortness of breath: Secondary | ICD-10-CM | POA: Diagnosis not present

## 2012-09-15 DIAGNOSIS — R059 Cough, unspecified: Secondary | ICD-10-CM | POA: Insufficient documentation

## 2012-09-15 DIAGNOSIS — Z8739 Personal history of other diseases of the musculoskeletal system and connective tissue: Secondary | ICD-10-CM | POA: Insufficient documentation

## 2012-09-15 DIAGNOSIS — D649 Anemia, unspecified: Secondary | ICD-10-CM | POA: Diagnosis not present

## 2012-09-15 DIAGNOSIS — F17200 Nicotine dependence, unspecified, uncomplicated: Secondary | ICD-10-CM | POA: Diagnosis not present

## 2012-09-15 DIAGNOSIS — G8929 Other chronic pain: Secondary | ICD-10-CM | POA: Diagnosis not present

## 2012-09-15 DIAGNOSIS — R079 Chest pain, unspecified: Secondary | ICD-10-CM | POA: Diagnosis not present

## 2012-09-15 DIAGNOSIS — J45901 Unspecified asthma with (acute) exacerbation: Secondary | ICD-10-CM | POA: Diagnosis not present

## 2012-09-15 DIAGNOSIS — J4521 Mild intermittent asthma with (acute) exacerbation: Secondary | ICD-10-CM

## 2012-09-15 DIAGNOSIS — R05 Cough: Secondary | ICD-10-CM | POA: Insufficient documentation

## 2012-09-15 HISTORY — DX: Scoliosis, unspecified: M41.9

## 2012-09-15 HISTORY — DX: Unspecified asthma, uncomplicated: J45.909

## 2012-09-15 LAB — POCT I-STAT TROPONIN I: Troponin i, poc: 0.02 ng/mL (ref 0.00–0.08)

## 2012-09-15 LAB — CBC
HCT: 23.8 % — ABNORMAL LOW (ref 36.0–46.0)
Hemoglobin: 7.6 g/dL — ABNORMAL LOW (ref 12.0–15.0)
MCH: 18.4 pg — ABNORMAL LOW (ref 26.0–34.0)
MCHC: 31.9 g/dL (ref 30.0–36.0)
MCV: 57.5 fL — ABNORMAL LOW (ref 78.0–100.0)
Platelets: 429 10*3/uL — ABNORMAL HIGH (ref 150–400)
RBC: 4.14 MIL/uL (ref 3.87–5.11)
RDW: 19.4 % — ABNORMAL HIGH (ref 11.5–15.5)
WBC: 8 10*3/uL (ref 4.0–10.5)

## 2012-09-15 LAB — BASIC METABOLIC PANEL
BUN: 13 mg/dL (ref 6–23)
CO2: 24 mEq/L (ref 19–32)
Calcium: 9.4 mg/dL (ref 8.4–10.5)
Chloride: 102 mEq/L (ref 96–112)
Creatinine, Ser: 1.01 mg/dL (ref 0.50–1.10)
GFR calc Af Amer: 77 mL/min — ABNORMAL LOW (ref >90)
GFR calc non Af Amer: 67 mL/min — ABNORMAL LOW (ref >90)
Glucose, Bld: 119 mg/dL — ABNORMAL HIGH (ref 70–99)
Potassium: 3 mEq/L — ABNORMAL LOW (ref 3.5–5.1)
Sodium: 136 mEq/L (ref 135–145)

## 2012-09-15 MED ORDER — HYDROCODONE-ACETAMINOPHEN 5-325 MG PO TABS
2.0000 | ORAL_TABLET | Freq: Once | ORAL | Status: AC
  Administered 2012-09-16: 2 via ORAL
  Filled 2012-09-15: qty 2

## 2012-09-15 MED ORDER — PREDNISONE 20 MG PO TABS
60.0000 mg | ORAL_TABLET | Freq: Once | ORAL | Status: AC
  Administered 2012-09-16: 60 mg via ORAL
  Filled 2012-09-15: qty 3

## 2012-09-15 MED ORDER — IPRATROPIUM BROMIDE 0.02 % IN SOLN
0.5000 mg | Freq: Once | RESPIRATORY_TRACT | Status: AC
  Administered 2012-09-15: 0.5 mg via RESPIRATORY_TRACT
  Filled 2012-09-15: qty 2.5

## 2012-09-15 MED ORDER — POTASSIUM CHLORIDE CRYS ER 20 MEQ PO TBCR
40.0000 meq | EXTENDED_RELEASE_TABLET | Freq: Once | ORAL | Status: AC
  Administered 2012-09-16: 40 meq via ORAL
  Filled 2012-09-15: qty 2

## 2012-09-15 MED ORDER — ALBUTEROL SULFATE (5 MG/ML) 0.5% IN NEBU
5.0000 mg | INHALATION_SOLUTION | Freq: Once | RESPIRATORY_TRACT | Status: AC
  Administered 2012-09-15: 5 mg via RESPIRATORY_TRACT
  Filled 2012-09-15: qty 1

## 2012-09-15 NOTE — ED Notes (Signed)
Patient presents to ED with complaints of chest pain when she takes a deep breath since yesterday. Patient states she feels like she is having back spasms and down to her legs.

## 2012-09-15 NOTE — ED Provider Notes (Signed)
CSN: [id number]     Arrival date & time 09/15/12  4098 History     First MD Initiated Contact with Patient 09/15/12 2313     Chief Complaint  Patient presents with  . Chest Pain   (Consider location/radiation/quality/duration/timing/severity/associated sxs/prior Treatment) HPI Chronic LBP for years ran out of hydrocodone no neuro Sxs, chronic heavy menses for years no bleeding now, LMP last week, PMH anemia, has Gyn can't recall name, here for chronic stable low back pain positional and nonexertional without radiation down the legs no weakness or numbness or change in bowel or bladder function and also 2 months mild nonexertional nonpleuritic CP with chronic cough and mild SOB 24/7. no fever no confusion no abdominal pain no bloody stools. Past Medical History  Diagnosis Date  . Hypertension   . Sarcoidosis   . Fibroid   . Asthma   . Scoliosis    Past Surgical History  Procedure Laterality Date  . Axillary lymph node dissection     History reviewed. No pertinent family history. History  Substance Use Topics  . Smoking status: Current Some Day Smoker  . Smokeless tobacco: Not on file  . Alcohol Use: Yes   OB History   Grav Para Term Preterm Abortions TAB SAB Ect Mult Living                 Review of Systems 10 Systems reviewed and are negative for acute change except as noted in the HPI. Allergies  Ivp dye  Home Medications   Current Outpatient Rx  Name  Route  Sig  Dispense  Refill  . albuterol (PROVENTIL HFA;VENTOLIN HFA) 108 (90 BASE) MCG/ACT inhaler   Inhalation   Inhale 2 puffs into the lungs every 6 (six) hours as needed for wheezing.         Marland Kitchen albuterol (PROVENTIL) (2.5 MG/3ML) 0.083% nebulizer solution   Nebulization   Take 2.5 mg by nebulization every 6 (six) hours as needed for wheezing.         . Ascorbic Acid (VITAMIN C) 1000 MG tablet   Oral   Take 1,000 mg by mouth daily.         Marland Kitchen aspirin-sod bicarb-citric acid (ALKA-SELTZER) 325 MG TBEF  tablet   Oral   Take 325 mg by mouth every 6 (six) hours as needed (for congestion).         . cetirizine (ZYRTEC) 10 MG tablet   Oral   Take 10 mg by mouth daily as needed for allergies.         Marland Kitchen doxycycline (VIBRA-TABS) 100 MG tablet   Oral   Take 100 mg by mouth 2 (two) times daily as needed. For flare ups         . ibuprofen (ADVIL,MOTRIN) 200 MG tablet   Oral   Take 600 mg by mouth daily as needed (for swelling).         . Multiple Vitamins-Minerals (MULTIVITAMINS THER. W/MINERALS) TABS   Oral   Take 1 tablet by mouth daily.         Marland Kitchen olmesartan (BENICAR) 40 MG tablet   Oral   Take 40 mg by mouth daily.         Marland Kitchen oxyCODONE-acetaminophen (PERCOCET) 10-325 MG per tablet   Oral   Take 1 tablet by mouth every 4 (four) hours as needed for pain.         Marland Kitchen HYDROcodone-acetaminophen (NORCO) 5-325 MG per tablet   Oral   Take 2 tablets  by mouth every 6 (six) hours as needed for pain.   20 tablet   0   . predniSONE (DELTASONE) 20 MG tablet      2 tabs po daily x 4 days   8 tablet   0    BP 144/96  Pulse 86  Temp(Src) 98.8 F (37.1 C) (Oral)  Resp 16  Ht 5' 7.5" (1.715 m)  Wt 224 lb (101.606 kg)  BMI 34.55 kg/m2  SpO2 100% Physical Exam  Nursing note and vitals reviewed. Constitutional:  Awake, alert, nontoxic appearance.  HENT:  Head: Atraumatic.  Eyes: Right eye exhibits no discharge. Left eye exhibits no discharge.  Neck: Neck supple.  Pulmonary/Chest: Effort normal. No respiratory distress. She has wheezes. She has no rales. She exhibits no tenderness.   exam mild diffuse wheezing, mild diffuse chest wall tenderness; pulse oximetry normal on room air 100%  Abdominal: Soft. There is no tenderness. There is no rebound.  Musculoskeletal: She exhibits tenderness. She exhibits no edema.  Baseline ROM, no obvious new focal weakness. Mild Baseline diffuse lumbar tenderness; bilateral lower extremities normal light touch dorsalis pedis pulses intact  capillary refill less than 2 seconds no obvious focal weakness noted  Neurological: She is alert.  Mental status and motor strength appears baseline for patient and situation.  Skin: No rash noted.  Psychiatric: She has a normal mood and affect.    ED Course   Procedures (including critical care time) ECG: Normal sinus rhythm, ventricular rate 89, normal axis, nonspecific T wave abnormality, prolonged QT, no comparison ECG available   Patient with significant anemia however feel outpatient followup reasonable do not think the patient needs emergent transfusion suspect chronic heavy menses as contributor to anemia and most likely cause.Patient informed of clinical course, understand medical decision-making process, and agree with plan. Labs Reviewed  CBC - Abnormal; Notable for the following:    Hemoglobin 7.6 (*)    HCT 23.8 (*)    MCV 57.5 (*)    MCH 18.4 (*)    RDW 19.4 (*)    Platelets 429 (*)    All other components within normal limits  BASIC METABOLIC PANEL - Abnormal; Notable for the following:    Potassium 3.0 (*)    Glucose, Bld 119 (*)    GFR calc non Af Amer 67 (*)    GFR calc Af Amer 77 (*)    All other components within normal limits  HCG, SERUM, QUALITATIVE  POCT I-STAT TROPONIN I   Dg Chest 2 View  09/15/2012   *RADIOLOGY REPORT*  Clinical Data: Shortness of breath, chest pain  CHEST - 2 VIEW  Comparison: July 19, 2010  Findings: There is no focal infiltrate, pulmonary edema, or pleural effusion.  The mediastinal contour and cardiac silhouette are normal.  The soft tissues and osseous structures are stable.  IMPRESSION: No acute cardiopulmonary disease identified.   Original Report Authenticated By: Sherian Rein, M.D.   1. Asthma exacerbation, mild intermittent   2. Chronic low back pain   3. Anemia     MDM  I doubt any other EMC precluding discharge at this time including, but not necessarily limited to the following:ACS, PE.  Hurman Horn, MD 09/16/12 [phone]

## 2012-09-16 LAB — HCG, SERUM, QUALITATIVE: Preg, Serum: NEGATIVE

## 2012-09-16 MED ORDER — PREDNISONE 20 MG PO TABS: ORAL_TABLET | ORAL | Status: DC

## 2012-09-16 MED ORDER — HYDROCODONE-ACETAMINOPHEN 5-325 MG PO TABS: 2.0000 | ORAL_TABLET | Freq: Four times a day (QID) | ORAL | Status: DC | PRN

## 2012-10-01 ENCOUNTER — Encounter (HOSPITAL_COMMUNITY): Payer: Self-pay

## 2012-10-01 ENCOUNTER — Emergency Department (HOSPITAL_COMMUNITY)
Admission: EM | Admit: 2012-10-01 | Discharge: 2012-10-01 | Disposition: A | Discharge status: Home / Self Care | Payer: Medicare Other | Attending: Emergency Medicine | Admitting: Emergency Medicine

## 2012-10-01 ENCOUNTER — Emergency Department (HOSPITAL_COMMUNITY): Payer: Medicare Other

## 2012-10-01 DIAGNOSIS — Z3202 Encounter for pregnancy test, result negative: Secondary | ICD-10-CM | POA: Insufficient documentation

## 2012-10-01 DIAGNOSIS — I10 Essential (primary) hypertension: Secondary | ICD-10-CM | POA: Diagnosis not present

## 2012-10-01 DIAGNOSIS — Z79899 Other long term (current) drug therapy: Secondary | ICD-10-CM | POA: Insufficient documentation

## 2012-10-01 DIAGNOSIS — Z7982 Long term (current) use of aspirin: Secondary | ICD-10-CM | POA: Insufficient documentation

## 2012-10-01 DIAGNOSIS — Z87891 Personal history of nicotine dependence: Secondary | ICD-10-CM | POA: Insufficient documentation

## 2012-10-01 DIAGNOSIS — Z8619 Personal history of other infectious and parasitic diseases: Secondary | ICD-10-CM | POA: Diagnosis not present

## 2012-10-01 DIAGNOSIS — R0602 Shortness of breath: Secondary | ICD-10-CM | POA: Insufficient documentation

## 2012-10-01 DIAGNOSIS — Z8742 Personal history of other diseases of the female genital tract: Secondary | ICD-10-CM | POA: Diagnosis not present

## 2012-10-01 DIAGNOSIS — M545 Low back pain, unspecified: Secondary | ICD-10-CM | POA: Insufficient documentation

## 2012-10-01 DIAGNOSIS — J45909 Unspecified asthma, uncomplicated: Secondary | ICD-10-CM | POA: Insufficient documentation

## 2012-10-01 DIAGNOSIS — R079 Chest pain, unspecified: Secondary | ICD-10-CM | POA: Diagnosis not present

## 2012-10-01 DIAGNOSIS — Z8739 Personal history of other diseases of the musculoskeletal system and connective tissue: Secondary | ICD-10-CM | POA: Insufficient documentation

## 2012-10-01 DIAGNOSIS — H02843 Edema of right eye, unspecified eyelid: Secondary | ICD-10-CM

## 2012-10-01 DIAGNOSIS — H02849 Edema of unspecified eye, unspecified eyelid: Secondary | ICD-10-CM | POA: Diagnosis not present

## 2012-10-01 LAB — BASIC METABOLIC PANEL
BUN: 10 mg/dL (ref 6–23)
CO2: 24 mEq/L (ref 19–32)
Calcium: 9.5 mg/dL (ref 8.4–10.5)
Chloride: 101 mEq/L (ref 96–112)
Creatinine, Ser: 0.86 mg/dL (ref 0.50–1.10)
GFR calc Af Amer: >90 mL/min (ref >90)
GFR calc non Af Amer: 81 mL/min — ABNORMAL LOW (ref >90)
Glucose, Bld: 94 mg/dL (ref 70–99)
Potassium: 3.9 mEq/L (ref 3.5–5.1)
Sodium: 135 mEq/L (ref 135–145)

## 2012-10-01 LAB — URINALYSIS, ROUTINE W REFLEX MICROSCOPIC
Bilirubin Urine: NEGATIVE
Glucose, UA: NEGATIVE mg/dL
Hgb urine dipstick: NEGATIVE
Ketones, ur: NEGATIVE mg/dL
Nitrite: NEGATIVE
Protein, ur: NEGATIVE mg/dL
Specific Gravity, Urine: 1.013 (ref 1.005–1.030)
Urobilinogen, UA: 0.2 mg/dL (ref 0.0–1.0)
pH: 7 (ref 5.0–8.0)

## 2012-10-01 LAB — CBC
HCT: 25.2 % — ABNORMAL LOW (ref 36.0–46.0)
Hemoglobin: 8.2 g/dL — ABNORMAL LOW (ref 12.0–15.0)
MCH: 19 pg — ABNORMAL LOW (ref 26.0–34.0)
MCHC: 32.5 g/dL (ref 30.0–36.0)
MCV: 58.5 fL — ABNORMAL LOW (ref 78.0–100.0)
Platelets: 295 10*3/uL (ref 150–400)
RBC: 4.31 MIL/uL (ref 3.87–5.11)
RDW: 20.3 % — ABNORMAL HIGH (ref 11.5–15.5)
WBC: 9.8 10*3/uL (ref 4.0–10.5)

## 2012-10-01 LAB — POCT I-STAT TROPONIN I: Troponin i, poc: 0 ng/mL (ref 0.00–0.08)

## 2012-10-01 LAB — URINE MICROSCOPIC-ADD ON

## 2012-10-01 LAB — POCT PREGNANCY, URINE: Preg Test, Ur: NEGATIVE

## 2012-10-01 MED ORDER — HYDROMORPHONE HCL PF 1 MG/ML IJ SOLN
1.0000 mg | Freq: Once | INTRAMUSCULAR | Status: AC
  Administered 2012-10-01: 1 mg via INTRAVENOUS
  Filled 2012-10-01: qty 1

## 2012-10-01 MED ORDER — DIAZEPAM 5 MG PO TABS: 5.0000 mg | ORAL_TABLET | Freq: Two times a day (BID) | ORAL | Status: DC

## 2012-10-01 MED ORDER — LORAZEPAM 2 MG/ML IJ SOLN
1.0000 mg | Freq: Once | INTRAMUSCULAR | Status: AC
  Administered 2012-10-01: 1 mg via INTRAVENOUS
  Filled 2012-10-01: qty 1

## 2012-10-01 NOTE — ED Provider Notes (Signed)
CSN: [id number]     Arrival date & time 10/01/12  1478 History     First MD Initiated Contact with Patient 10/01/12 1006     Chief Complaint  Patient presents with  . Shortness of Breath  . Chest Pain   (Consider location/radiation/quality/duration/timing/severity/associated sxs/prior Treatment) HPI  47 year old female with history of chronic low back pain, asthma, sarcoidosis, and hypertension presents complaining of low back pain. Patient states that she history of chronic back pain which she takes Norco and Percocet at home. For the past 2 days her pain has gotten progressively worse. Describe pain as achy sharp sensation with muscle spasm. Pain radiates around to her low abdomen, and felt like "I'm having contractions".  Pain has been intermittent, nothing seems to make it better or worse. Pain was so bad this morning that she was having a panic-like attack with chest pain shortness of breath however both has resolved. Patient also reports that she has history of recurrent boils, with one affecting her right eyelid since yesterday. This morning she woke up with the eyelid swollen shut. Complaining of pain to eyelids with palpation, no itchiness. This report discharge coming from eyelid. No vision changes.  Denies recent trauma.  No fever, chills, nausea, diaphoresis, numbness, weakness, dysuria, hematuria, or rash.  Pt has applied warm compress to affected eyelid this morning without relief.    Past Medical History  Diagnosis Date  . Hypertension   . Sarcoidosis   . Fibroid   . Asthma   . Scoliosis    Past Surgical History  Procedure Laterality Date  . Axillary lymph node dissection     History reviewed. No pertinent family history. History  Substance Use Topics  . Smoking status: Former Games developer  . Smokeless tobacco: Not on file  . Alcohol Use: Yes     Comment: pt states she quit smoking   OB History   Grav Para Term Preterm Abortions TAB SAB Ect Mult Living                  Review of Systems  All other systems reviewed and are negative.    Allergies  Ivp dye  Home Medications   Current Outpatient Rx  Name  Route  Sig  Dispense  Refill  . albuterol (PROVENTIL HFA;VENTOLIN HFA) 108 (90 BASE) MCG/ACT inhaler   Inhalation   Inhale 2 puffs into the lungs every 6 (six) hours as needed for wheezing.         Marland Kitchen albuterol (PROVENTIL) (2.5 MG/3ML) 0.083% nebulizer solution   Nebulization   Take 2.5 mg by nebulization every 6 (six) hours as needed for wheezing.         . Ascorbic Acid (VITAMIN C) 1000 MG tablet   Oral   Take 1,000 mg by mouth daily.         Marland Kitchen aspirin-sod bicarb-citric acid (ALKA-SELTZER) 325 MG TBEF tablet   Oral   Take 325 mg by mouth every 6 (six) hours as needed (for congestion).         . cetirizine (ZYRTEC) 10 MG tablet   Oral   Take 10 mg by mouth daily as needed for allergies.         Marland Kitchen doxycycline (VIBRA-TABS) 100 MG tablet   Oral   Take 100 mg by mouth 2 (two) times daily as needed. For flare ups         . esomeprazole (NEXIUM) 40 MG capsule   Oral   Take 40 mg by  mouth daily before breakfast.         . HYDROcodone-acetaminophen (NORCO) 5-325 MG per tablet   Oral   Take 2 tablets by mouth every 6 (six) hours as needed for pain.   20 tablet   0   . ibuprofen (ADVIL,MOTRIN) 200 MG tablet   Oral   Take 600 mg by mouth daily as needed (for swelling).         . LORazepam (ATIVAN) 1 MG tablet   Oral   Take 1 mg by mouth every 8 (eight) hours as needed for anxiety.         . Multiple Vitamins-Minerals (MULTIVITAMINS THER. W/MINERALS) TABS   Oral   Take 1 tablet by mouth daily.         Marland Kitchen olmesartan (BENICAR) 40 MG tablet   Oral   Take 40 mg by mouth daily.         Marland Kitchen oxyCODONE-acetaminophen (PERCOCET) 10-325 MG per tablet   Oral   Take 1 tablet by mouth every 4 (four) hours as needed for pain.         . potassium chloride (K-DUR,KLOR-CON) 10 MEQ tablet   Oral   Take 10 mEq by mouth  daily.          BP 163/100  Pulse 81  Temp(Src) 98 F (36.7 C) (Oral)  Resp 18  SpO2 100%  LMP 08/31/2012 Physical Exam  Nursing note and vitals reviewed. Constitutional: She is oriented to person, place, and time. She appears well-developed and well-nourished. She appears distressed (tearful, nontoxic in appearance.).  HENT:  Head: Normocephalic and atraumatic.  Eyes: Conjunctivae and EOM are normal. Pupils are equal, round, and reactive to light.  R upper eyelid is moderately edematous, ttp, mild erythema noted.  R eye is swollen shut due to eyelid edema, mild exudates noted.  No fb seen or palpated.    Cardiovascular: Normal rate.   Pulmonary/Chest: Effort normal and breath sounds normal.  Abdominal: Soft. There is no tenderness (mild lower abdominal tenderness without focal point tenderness, mass, or rash noted).  Musculoskeletal: She exhibits tenderness (lumbar and paralumbar tenderness on exam without crepitus, stepoff or rash noted.  ).  Neurological: She is alert and oriented to person, place, and time.  Skin: No rash noted.  Psychiatric: She has a normal mood and affect.    ED Course   Procedures (including critical care time)   Date: 10/01/2012  Rate: 79  Rhythm: normal sinus rhythm  QRS Axis: normal  Intervals: normal  ST/T Wave abnormalities: nonspecific ST changes  Conduction Disutrbances:none  Narrative Interpretation:   Old EKG Reviewed: unchanged    Patient's primary complaint is low back pain. States the chest pain short of breath is as a result of severe back pain. She also endorsed muscle spasm associate with back pain. Pain is reproducible on exam likely musculoskeletal. Patient received pain medication and muscle relaxant and states the symptom has improved dramatically. She is more comfortable. She has edema noted to the right upper eyelid. Moderate swelling, unsure if it's from a stye or from cellulitis.  No visual impairment. No evidence of muscle  entrapment. Will apply warm compress for comfort.  Care discussed with attending.  12:43 PM The patient is back to her normal baseline. No chest pain, shortness of breath, or low back pain. Muscle spasm has resolved. I apply warm compress over her right eyelid and recommend for patient to continue with the same treatment for the next several days. She is  scheduled to follow up with her doctor on Monday. I recommend if the symptoms worsen she can return to the ED for further management. Otherwise will discharge patient with muscle relaxant.  Pt has pain medication at home. Patient stable for discharge.  Labs Reviewed  CBC - Abnormal; Notable for the following:    Hemoglobin 8.2 (*)    HCT 25.2 (*)    MCV 58.5 (*)    MCH 19.0 (*)    RDW 20.3 (*)    All other components within normal limits  BASIC METABOLIC PANEL - Abnormal; Notable for the following:    GFR calc non Af Amer 81 (*)    All other components within normal limits  URINALYSIS, ROUTINE W REFLEX MICROSCOPIC - Abnormal; Notable for the following:    APPearance HAZY (*)    Leukocytes, UA SMALL (*)    All other components within normal limits  URINE MICROSCOPIC-ADD ON - Abnormal; Notable for the following:    Squamous Epithelial / LPF FEW (*)    All other components within normal limits  POCT PREGNANCY, URINE  POCT I-STAT TROPONIN I   Dg Chest Port 1 View  10/01/2012   *RADIOLOGY REPORT*  Clinical Data: Shortness of breath.  PORTABLE CHEST - 1 VIEW  Comparison: 09/15/2012  Findings: Portable lordotic examination without obvious pneumothorax, infiltrate or pulmonary edema.  Mild central pulmonary vascular prominence.  Heart size within normal limits.  The patient would eventually benefit from follow-up two-view chest with cardiac leads removed if there are persistent symptoms.  IMPRESSION: Portable lordotic examination without obvious pneumothorax, infiltrate or pulmonary edema.   Original Report Authenticated By: Lacy Duverney, M.D.    1. Low back pain   2. Eyelid edema, right     MDM  BP 150/90  Pulse 86  Temp(Src) 98 F (36.7 C) (Oral)  Resp 18  SpO2 100%  LMP 08/31/2012  I have reviewed nursing notes and vital signs. I personally reviewed the imaging tests through PACS system  I reviewed available ER/hospitalization records thought the EMR     Fayrene Helper, [place] 10/02/12 0454

## 2012-10-01 NOTE — ED Notes (Signed)
Warm compress placed on right eye per PA order.

## 2012-10-01 NOTE — ED Notes (Signed)
Per [facility], pt has hx of sarcoidosis and presents with SOB and chest pain with lower back pain. Also had a boil on her right eye last night and now the eye is swollen shut. 178/110 BP and 98 HR, 100% on RA

## 2012-10-02 NOTE — ED Provider Notes (Signed)
Medical screening examination/treatment/procedure(s) were performed by non-physician practitioner and as supervising physician I was immediately available for consultation/collaboration.   Audree Camel, MD 10/02/12 [phone]

## 2012-10-06 ENCOUNTER — Other Ambulatory Visit: Payer: Self-pay | Admitting: Obstetrics and Gynecology

## 2012-10-06 DIAGNOSIS — N920 Excessive and frequent menstruation with regular cycle: Secondary | ICD-10-CM | POA: Diagnosis not present

## 2012-10-06 DIAGNOSIS — Z1231 Encounter for screening mammogram for malignant neoplasm of breast: Secondary | ICD-10-CM

## 2012-10-06 DIAGNOSIS — N949 Unspecified condition associated with female genital organs and menstrual cycle: Secondary | ICD-10-CM | POA: Diagnosis not present

## 2012-10-06 DIAGNOSIS — Z124 Encounter for screening for malignant neoplasm of cervix: Secondary | ICD-10-CM | POA: Diagnosis not present

## 2012-10-06 DIAGNOSIS — Z01419 Encounter for gynecological examination (general) (routine) without abnormal findings: Secondary | ICD-10-CM | POA: Diagnosis not present

## 2012-10-06 DIAGNOSIS — R87610 Atypical squamous cells of undetermined significance on cytologic smear of cervix (ASC-US): Secondary | ICD-10-CM | POA: Diagnosis not present

## 2012-10-26 DIAGNOSIS — L732 Hidradenitis suppurativa: Secondary | ICD-10-CM | POA: Diagnosis not present

## 2012-10-26 DIAGNOSIS — F411 Generalized anxiety disorder: Secondary | ICD-10-CM | POA: Diagnosis not present

## 2012-10-26 DIAGNOSIS — M545 Low back pain, unspecified: Secondary | ICD-10-CM | POA: Diagnosis not present

## 2012-10-26 DIAGNOSIS — D869 Sarcoidosis, unspecified: Secondary | ICD-10-CM | POA: Diagnosis not present

## 2012-10-27 DIAGNOSIS — Z13820 Encounter for screening for osteoporosis: Secondary | ICD-10-CM | POA: Diagnosis not present

## 2012-10-27 DIAGNOSIS — J99 Respiratory disorders in diseases classified elsewhere: Secondary | ICD-10-CM | POA: Diagnosis not present

## 2012-10-27 DIAGNOSIS — R0602 Shortness of breath: Secondary | ICD-10-CM | POA: Diagnosis not present

## 2012-10-27 DIAGNOSIS — N959 Unspecified menopausal and perimenopausal disorder: Secondary | ICD-10-CM | POA: Diagnosis not present

## 2012-10-27 DIAGNOSIS — F17200 Nicotine dependence, unspecified, uncomplicated: Secondary | ICD-10-CM | POA: Diagnosis not present

## 2012-10-27 DIAGNOSIS — D869 Sarcoidosis, unspecified: Secondary | ICD-10-CM | POA: Diagnosis not present

## 2012-11-02 ENCOUNTER — Ambulatory Visit
Admission: RE | Admit: 2012-11-02 | Discharge: 2012-11-02 | Disposition: A | Discharge status: Home / Self Care | Payer: Medicare Other | Source: Ambulatory Visit | Attending: Obstetrics and Gynecology | Admitting: Obstetrics and Gynecology

## 2012-11-02 DIAGNOSIS — Z1231 Encounter for screening mammogram for malignant neoplasm of breast: Secondary | ICD-10-CM | POA: Diagnosis not present

## 2012-11-02 DIAGNOSIS — R87810 Cervical high risk human papillomavirus (HPV) DNA test positive: Secondary | ICD-10-CM | POA: Diagnosis not present

## 2012-11-03 ENCOUNTER — Other Ambulatory Visit: Payer: Self-pay | Admitting: Obstetrics and Gynecology

## 2012-11-03 DIAGNOSIS — R928 Other abnormal and inconclusive findings on diagnostic imaging of breast: Secondary | ICD-10-CM

## 2012-11-09 DIAGNOSIS — J309 Allergic rhinitis, unspecified: Secondary | ICD-10-CM | POA: Diagnosis not present

## 2012-11-09 DIAGNOSIS — D869 Sarcoidosis, unspecified: Secondary | ICD-10-CM | POA: Diagnosis not present

## 2012-11-09 DIAGNOSIS — J99 Respiratory disorders in diseases classified elsewhere: Secondary | ICD-10-CM | POA: Diagnosis not present

## 2012-11-09 DIAGNOSIS — K219 Gastro-esophageal reflux disease without esophagitis: Secondary | ICD-10-CM | POA: Diagnosis not present

## 2012-11-09 DIAGNOSIS — R0602 Shortness of breath: Secondary | ICD-10-CM | POA: Diagnosis not present

## 2012-11-12 DIAGNOSIS — N859 Noninflammatory disorder of uterus, unspecified: Secondary | ICD-10-CM | POA: Diagnosis not present

## 2012-11-12 DIAGNOSIS — N920 Excessive and frequent menstruation with regular cycle: Secondary | ICD-10-CM | POA: Diagnosis not present

## 2012-11-12 DIAGNOSIS — N840 Polyp of corpus uteri: Secondary | ICD-10-CM | POA: Diagnosis not present

## 2012-11-17 ENCOUNTER — Ambulatory Visit
Admission: RE | Admit: 2012-11-17 | Discharge: 2012-11-17 | Disposition: A | Discharge status: Home / Self Care | Payer: Medicare Other | Source: Ambulatory Visit | Attending: Obstetrics and Gynecology | Admitting: Obstetrics and Gynecology

## 2012-11-17 DIAGNOSIS — R599 Enlarged lymph nodes, unspecified: Secondary | ICD-10-CM | POA: Diagnosis not present

## 2012-11-17 DIAGNOSIS — R928 Other abnormal and inconclusive findings on diagnostic imaging of breast: Secondary | ICD-10-CM

## 2012-11-19 DIAGNOSIS — M545 Low back pain, unspecified: Secondary | ICD-10-CM | POA: Diagnosis not present

## 2012-11-19 DIAGNOSIS — L732 Hidradenitis suppurativa: Secondary | ICD-10-CM | POA: Diagnosis not present

## 2012-11-19 DIAGNOSIS — F411 Generalized anxiety disorder: Secondary | ICD-10-CM | POA: Diagnosis not present

## 2012-11-19 DIAGNOSIS — D869 Sarcoidosis, unspecified: Secondary | ICD-10-CM | POA: Diagnosis not present

## 2012-11-25 DIAGNOSIS — N920 Excessive and frequent menstruation with regular cycle: Secondary | ICD-10-CM | POA: Diagnosis not present

## 2012-11-25 DIAGNOSIS — Z09 Encounter for follow-up examination after completed treatment for conditions other than malignant neoplasm: Secondary | ICD-10-CM | POA: Diagnosis not present

## 2012-12-17 DIAGNOSIS — N920 Excessive and frequent menstruation with regular cycle: Secondary | ICD-10-CM | POA: Diagnosis not present

## 2012-12-17 DIAGNOSIS — N840 Polyp of corpus uteri: Secondary | ICD-10-CM | POA: Diagnosis not present

## 2013-01-12 ENCOUNTER — Emergency Department (HOSPITAL_COMMUNITY)
Admission: EM | Admit: 2013-01-12 | Discharge: 2013-01-12 | Disposition: A | Discharge status: Home / Self Care | Payer: Medicare Other | Attending: Emergency Medicine | Admitting: Emergency Medicine

## 2013-01-12 ENCOUNTER — Encounter (HOSPITAL_COMMUNITY): Payer: Self-pay | Admitting: Emergency Medicine

## 2013-01-12 ENCOUNTER — Emergency Department (HOSPITAL_COMMUNITY): Payer: Medicare Other

## 2013-01-12 DIAGNOSIS — Z888 Allergy status to other drugs, medicaments and biological substances status: Secondary | ICD-10-CM | POA: Diagnosis not present

## 2013-01-12 DIAGNOSIS — M4120 Other idiopathic scoliosis, site unspecified: Secondary | ICD-10-CM | POA: Insufficient documentation

## 2013-01-12 DIAGNOSIS — I10 Essential (primary) hypertension: Secondary | ICD-10-CM | POA: Insufficient documentation

## 2013-01-12 DIAGNOSIS — Y939 Activity, unspecified: Secondary | ICD-10-CM | POA: Insufficient documentation

## 2013-01-12 DIAGNOSIS — Z8742 Personal history of other diseases of the female genital tract: Secondary | ICD-10-CM | POA: Insufficient documentation

## 2013-01-12 DIAGNOSIS — R209 Unspecified disturbances of skin sensation: Secondary | ICD-10-CM | POA: Insufficient documentation

## 2013-01-12 DIAGNOSIS — Y92410 Unspecified street and highway as the place of occurrence of the external cause: Secondary | ICD-10-CM | POA: Insufficient documentation

## 2013-01-12 DIAGNOSIS — S43429A Sprain of unspecified rotator cuff capsule, initial encounter: Secondary | ICD-10-CM | POA: Diagnosis not present

## 2013-01-12 DIAGNOSIS — S46909A Unspecified injury of unspecified muscle, fascia and tendon at shoulder and upper arm level, unspecified arm, initial encounter: Secondary | ICD-10-CM | POA: Diagnosis not present

## 2013-01-12 DIAGNOSIS — S4980XA Other specified injuries of shoulder and upper arm, unspecified arm, initial encounter: Secondary | ICD-10-CM | POA: Diagnosis not present

## 2013-01-12 DIAGNOSIS — Z87891 Personal history of nicotine dependence: Secondary | ICD-10-CM | POA: Diagnosis not present

## 2013-01-12 DIAGNOSIS — D869 Sarcoidosis, unspecified: Secondary | ICD-10-CM | POA: Diagnosis not present

## 2013-01-12 DIAGNOSIS — M25519 Pain in unspecified shoulder: Secondary | ICD-10-CM | POA: Diagnosis not present

## 2013-01-12 DIAGNOSIS — Z79899 Other long term (current) drug therapy: Secondary | ICD-10-CM | POA: Diagnosis not present

## 2013-01-12 DIAGNOSIS — S46011A Strain of muscle(s) and tendon(s) of the rotator cuff of right shoulder, initial encounter: Secondary | ICD-10-CM

## 2013-01-12 DIAGNOSIS — J45909 Unspecified asthma, uncomplicated: Secondary | ICD-10-CM | POA: Diagnosis not present

## 2013-01-12 MED ORDER — OXYCODONE-ACETAMINOPHEN 5-325 MG PO TABS
2.0000 | ORAL_TABLET | Freq: Once | ORAL | Status: AC
  Administered 2013-01-12: 2 via ORAL
  Filled 2013-01-12: qty 2

## 2013-01-12 MED ORDER — OXYCODONE-ACETAMINOPHEN 10-325 MG PO TABS: 1.0000 | ORAL_TABLET | ORAL | Status: DC | PRN

## 2013-01-12 MED ORDER — IBUPROFEN 200 MG PO TABS: 600.0000 mg | ORAL_TABLET | Freq: Every day | ORAL | Status: DC | PRN
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
## 2013-01-13 NOTE — ED Provider Notes (Signed)
Medical screening examination/treatment/procedure(s) were performed by non-physician practitioner and as supervising physician I was immediately available for consultation/collaboration.  EKG Interpretation   None         Enid Skeens, MD 01/13/13 [phone]

## 2013-03-06 ENCOUNTER — Emergency Department (INDEPENDENT_AMBULATORY_CARE_PROVIDER_SITE_OTHER)
Admission: EM | Admit: 2013-03-06 | Discharge: 2013-03-06 | Disposition: A | Discharge status: Home / Self Care | Payer: Medicare Other | Source: Home / Self Care | Attending: Emergency Medicine | Admitting: Emergency Medicine

## 2013-03-06 ENCOUNTER — Encounter (HOSPITAL_COMMUNITY): Payer: Self-pay | Admitting: Emergency Medicine

## 2013-03-06 DIAGNOSIS — IMO0002 Reserved for concepts with insufficient information to code with codable children: Secondary | ICD-10-CM | POA: Diagnosis not present

## 2013-03-06 MED ORDER — OXYCODONE-ACETAMINOPHEN 5-325 MG PO TABS: ORAL_TABLET | ORAL | Status: DC

## 2013-03-06 MED ORDER — SULFAMETHOXAZOLE-TMP DS 800-160 MG PO TABS: 1.0000 | ORAL_TABLET | Freq: Two times a day (BID) | ORAL | Status: DC

## 2013-03-06 MED ORDER — CEPHALEXIN 500 MG PO CAPS: 500.0000 mg | ORAL_CAPSULE | Freq: Three times a day (TID) | ORAL | Status: DC

## 2013-03-06 NOTE — ED Provider Notes (Signed)
Chief Complaint:   Chief Complaint  Patient presents with  . Hand Pain    History of Present Illness:   Crystal Bullock is a 48 year old female who has had a four-day history of pain and swelling over the ulnar nail fold of the left middle finger. She has a hangnail on this finger. There's been no drainage. She has had subjective fever. She's able to move all of her joints well.  Review of Systems:  Other than noted above, the patient denies any of the following symptoms: Systemic:  No fevers, chills, or sweats.  No fatigue or tiredness. Musculoskeletal:  No joint pain, arthritis, bursitis, swelling, back pain, or neck pain.  Neurological:  No muscular weakness, paresthesias.  [place]:  Past medical history, family history, social history, meds, and allergies were reviewed.   Physical Exam:   Vital signs:  BP 166/72  Pulse 81  Temp(Src) 99 F (37.2 C) (Oral)  Resp 19  SpO2 99%  LMP 02/27/2013 Gen:  Alert and oriented times 3.  In no distress. Musculoskeletal:  Exam of the hand reveals swelling, erythema, and a visible collection of pus underneath the ulnar nail fold of the left middle finger. This is tender to touch. There is no tenderness over the volar pad, the PIP or DIP joints, and she has a full range of motion of all joints of his finger without any pain.  Otherwise, all joints had a full a ROM with no swelling, bruising or deformity.  No edema, pulses full. Extremities were warm and pink.  Capillary refill was brisk.  Skin:  Clear, warm and dry.  No rash. Neuro:  Alert and oriented times 3.  Muscle strength was normal.  Sensation was intact to light touch.    Procedure Note   Verbal informed consent was obtained from the patient.  Risks and benefits were outlined with the patient.  Patient understands and accepts these risks. A time out was called and the procedure and identity of the patient were confirmed verbally.    The procedure was then performed as follows:  The  finger was prepped with alcohol and anesthetized with ethyl chloride spray. A single incision was made into the visible collection of pus. The patient did not have any pain. Several drops of pus were drained out and this was cultured. Antibiotic ointment was applied and a sterile dressing.  The patient tolerated the procedure well without any immediate complications.   Assessment:  The encounter diagnosis was Paronychia.  No evidence of a felon.  Plan:   1.  Meds:  The following meds were prescribed:   Discharge Medication List as of 03/06/2013  4:20 PM    START taking these medications   Details  cephALEXin (KEFLEX) 500 MG capsule Take 1 capsule (500 mg total) by mouth 3 (three) times daily., Starting 03/06/2013, Until Discontinued, Normal    oxyCODONE-acetaminophen (PERCOCET) 5-325 MG per tablet 1 to 2 tablets every 6 hours as needed for pain., Print    sulfamethoxazole-trimethoprim (BACTRIM DS) 800-160 MG per tablet Take 1 tablet by mouth 2 (two) times daily., Starting 03/06/2013, Until Discontinued, Normal        2.  Patient Education/Counseling:  The patient was given appropriate handouts, self care instructions, and instructed in symptomatic relief, including rest and activity, elevation, and wound care.  3.  Follow up:  The patient was told to follow up if no better in 3 to 4 days, if becoming worse in any way, and given some red  flag symptoms such as progressive swelling of the finger, pain, or fever which would prompt immediate return.  Follow up here as needed.      Harden Mo, MD 03/06/13 [phone]

## 2013-03-06 NOTE — ED Notes (Addendum)
Pt c/o left middle finger infection/paronychia onset 3 to 4 days Finger around nailbed is tender; also c/o fevers and vomiting States she has been taking Amox BID x4 days that she has for recurrent boils Medication list shows she was given doxycycline in the past Alert w/no signs of acute distress.

## 2013-03-06 NOTE — Discharge Instructions (Signed)
Tomorrow soak in warm water with Epsom salts for 5 minutes, apply antibiotic ointment and a Band Aid. Change dressing twice daily.    Paronychia Paronychia is an inflammatory reaction involving the folds of the skin surrounding the fingernail. This is commonly caused by an infection in the skin around a nail. The most common cause of paronychia is frequent wetting of the hands (as seen with bartenders, food servers, nurses or others who wet their hands). This makes the skin around the fingernail susceptible to infection by bacteria (germs) or fungus. Other predisposing factors are:  Aggressive manicuring.  Nail biting.  Thumb sucking. The most common cause is a staphylococcal (a type of germ) infection, or a fungal (Candida) infection. When caused by a germ, it usually comes on suddenly with redness, swelling, pus and is often painful. It may get under the nail and form an abscess (collection of pus), or form an abscess around the nail. If the nail itself is infected with a fungus, the treatment is usually prolonged and may require oral medicine for up to one year. Your caregiver will determine the length of time treatment is required. The paronychia caused by bacteria (germs) may largely be avoided by not pulling on hangnails or picking at cuticles. When the infection occurs at the tips of the finger it is called felon. When the cause of paronychia is from the herpes simplex virus (HSV) it is called herpetic [place]. TREATMENT  When an abscess is present treatment is often incision and drainage. This means that the abscess must be cut open so the pus can get out. When this is done, the following home care instructions should be followed. HOME CARE INSTRUCTIONS   It is important to keep the affected fingers very dry. Rubber or plastic gloves over cotton gloves should be used whenever the hand must be placed in water.  Keep wound clean, dry and dressed as suggested by your caregiver between warm  soaks or warm compresses.  Soak in warm water for fifteen to twenty minutes three to four times per day for bacterial infections. Fungal infections are very difficult to treat, so often require treatment for long periods of time.  For bacterial (germ) infections take antibiotics (medicine which kill germs) as directed and finish the prescription, even if the problem appears to be solved before the medicine is gone.  Only take over-the-counter or prescription medicines for pain, discomfort, or fever as directed by your caregiver. SEEK IMMEDIATE MEDICAL CARE IF:  You have redness, swelling, or increasing pain in the wound.  You notice pus coming from the wound.  You have a fever.  You notice a bad smell coming from the wound or dressing. Document Released: 07/24/2000 Document Revised: 04/22/2011 Document Reviewed: 03/25/2008 [facility] Patient Information 2014 [place].

## 2013-03-09 LAB — CULTURE, ROUTINE-ABSCESS

## 2013-03-11 ENCOUNTER — Telehealth (HOSPITAL_COMMUNITY): Payer: Self-pay | Admitting: *Deleted

## 2013-03-11 NOTE — ED Notes (Signed)
Abscess culture L middle finger: Mod. MRSA.  Pt. adequately treated with Bactrim and also got Keflex.  I called pt. Pt. verified x 2 and given results.  Pt. told she was adequately treated and to finish all of her antibiotics. I reviewed the [facility] MRSA instructions.  Pt. voiced understanding. Roselyn Meier 03/11/2013

## 2013-05-29 ENCOUNTER — Emergency Department (INDEPENDENT_AMBULATORY_CARE_PROVIDER_SITE_OTHER)
Admission: EM | Admit: 2013-05-29 | Discharge: 2013-05-29 | Disposition: A | Discharge status: Home / Self Care | Payer: Medicare Other | Source: Home / Self Care | Attending: Family Medicine | Admitting: Family Medicine

## 2013-05-29 ENCOUNTER — Encounter (HOSPITAL_COMMUNITY): Payer: Self-pay | Admitting: Emergency Medicine

## 2013-05-29 DIAGNOSIS — R599 Enlarged lymph nodes, unspecified: Secondary | ICD-10-CM | POA: Diagnosis not present

## 2013-05-29 DIAGNOSIS — R591 Generalized enlarged lymph nodes: Secondary | ICD-10-CM

## 2013-05-29 MED ORDER — AMOXICILLIN 500 MG PO CAPS: 500.0000 mg | ORAL_CAPSULE | Freq: Three times a day (TID) | ORAL | Status: DC

## 2013-05-29 NOTE — ED Provider Notes (Signed)
Crystal Bullock is a 48 y.o. female who presents to Urgent Care today for swollen lymph nodes. Patient has had a few days of swollen bilateral submandibular lymph nodes. This is occurring with coughing congestion and runny nose. She attributes her symptoms to seasonal allergies. The lymph nodes are mildly tender. Symptoms are worse with eating and drinking. No fevers or chills nausea vomiting or diarrhea.   Past Medical History  Diagnosis Date  . Hypertension   . Sarcoidosis   . Fibroid   . Asthma   . Scoliosis    History  Substance Use Topics  . Smoking status: Former Research scientist (life sciences)  . Smokeless tobacco: Not on file  . Alcohol Use: Yes     Comment: pt states she quit smoking   ROS as above Medications: No current facility-administered medications for this encounter.   Current Outpatient Prescriptions  Medication Sig Dispense Refill  . amLODipine (NORVASC) 10 MG tablet Take 10 mg by mouth daily.      . Ascorbic Acid (VITAMIN C) 1000 MG tablet Take 1,000 mg by mouth daily.      Marland Kitchen doxycycline (VIBRA-TABS) 100 MG tablet Take 100 mg by mouth 2 (two) times daily as needed. For flare ups      . oxyCODONE-acetaminophen (PERCOCET) 10-325 MG per tablet Take 1 tablet by mouth every 4 (four) hours as needed for pain.  7 tablet  0  . albuterol (PROVENTIL HFA;VENTOLIN HFA) 108 (90 BASE) MCG/ACT inhaler Inhale 2 puffs into the lungs every 6 (six) hours as needed for wheezing.      Marland Kitchen albuterol (PROVENTIL) (2.5 MG/3ML) 0.083% nebulizer solution Take 2.5 mg by nebulization every 6 (six) hours as needed for wheezing.      Marland Kitchen amoxicillin (AMOXIL) 500 MG capsule Take 1 capsule (500 mg total) by mouth 3 (three) times daily.  21 capsule  0  . aspirin-sod bicarb-citric acid (ALKA-SELTZER) 325 MG TBEF tablet Take 325 mg by mouth every 6 (six) hours as needed (for congestion).      . cephALEXin (KEFLEX) 500 MG capsule Take 1 capsule (500 mg total) by mouth 3 (three) times daily.  30 capsule  0  . cetirizine  (ZYRTEC) 10 MG tablet Take 10 mg by mouth daily as needed for allergies.      Marland Kitchen esomeprazole (NEXIUM) 40 MG capsule Take 40 mg by mouth daily before breakfast.      . HYDROcodone-acetaminophen (NORCO) 5-325 MG per tablet Take 2 tablets by mouth every 6 (six) hours as needed for pain.  20 tablet  0  . ibuprofen (ADVIL,MOTRIN) 200 MG tablet Take 3 tablets (600 mg total) by mouth daily as needed (for swelling).  30 tablet  0  . LORazepam (ATIVAN) 1 MG tablet Take 1 mg by mouth 2 (two) times daily.      . Multiple Vitamins-Minerals (MULTIVITAMINS THER. W/MINERALS) TABS Take 1 tablet by mouth daily.      Marland Kitchen oxyCODONE-acetaminophen (PERCOCET) 5-325 MG per tablet 1 to 2 tablets every 6 hours as needed for pain.  20 tablet  0  . potassium chloride (K-DUR,KLOR-CON) 10 MEQ tablet Take 10 mEq by mouth daily.      Marland Kitchen sulfamethoxazole-trimethoprim (BACTRIM DS) 800-160 MG per tablet Take 1 tablet by mouth 2 (two) times daily.  20 tablet  0    Exam:  BP 170/115  Pulse 90  Temp(Src) 98.3 F (36.8 C) (Oral)  Resp 20  SpO2 97%  LMP 05/26/2013 Gen: Well NAD HEENT: EOMI,  MMM moderate tender  bilateral submandibular lymph nodes. Clear nasal discharge. Posterior pharynx with cobblestoning. Normal tympanic membranes bilaterally.\ Lungs: Normal work of breathing. CTABL Heart: RRR no MRG Abd: NABS, Soft. NT, ND Exts: Brisk capillary refill, warm and well perfused.   No results found for this or any previous visit (from the past 24 hour(s)). No results found.  Assessment and Plan: 48 y.o. female with lymphadenopathy of the bilateral submandibular lymph nodes. Plan for routine management and watchful waiting. NSAIDs for pain control. Amoxicillin symptoms not improve.   Discussed warning signs or symptoms. Please see discharge instructions. Patient expresses understanding.    Gregor Hams, MD 05/29/13 [phone]

## 2013-05-29 NOTE — ED Notes (Signed)
C/o glands swelling around neck x 1 wk.  Discomfort felt with talking and eating.  No otc meds taken.  Denies any other symptoms.

## 2013-05-29 NOTE — Discharge Instructions (Signed)
Thank you for coming in today. Take two aleve twice dialy for pain as needed.  If not improving take amoxicillin.  Call or go to the emergency room if you get worse, have trouble breathing, have chest pains, or palpitations.   Lymphadenopathy Lymphadenopathy means "disease of the lymph glands." But the term is usually used to describe swollen or enlarged lymph glands, also called lymph nodes. These are the bean-shaped organs found in many locations including the neck, underarm, and groin. Lymph glands are part of the immune system, which fights infections in your body. Lymphadenopathy can occur in just one area of the body, such as the neck, or it can be generalized, with lymph node enlargement in several areas. The nodes found in the neck are the most common sites of lymphadenopathy. CAUSES  When your immune system responds to germs (such as viruses or bacteria ), infection-fighting cells and fluid build up. This causes the glands to grow in size. This is usually not something to worry about. Sometimes, the glands themselves can become infected and inflamed. This is called lymphadenitis. Enlarged lymph nodes can be caused by many diseases:  Bacterial disease, such as strep throat or a skin infection.  Viral disease, such as a common cold.  Other germs, such as lyme disease, tuberculosis, or sexually transmitted diseases.  Cancers, such as lymphoma (cancer of the lymphatic system) or leukemia (cancer of the white blood cells).  Inflammatory diseases such as lupus or rheumatoid arthritis.  Reactions to medications. Many of the diseases above are rare, but important. This is why you should see your caregiver if you have lymphadenopathy. SYMPTOMS   Swollen, enlarged lumps in the neck, back of the head or other locations.  Tenderness.  Warmth or redness of the skin over the lymph nodes.  Fever. DIAGNOSIS  Enlarged lymph nodes are often near the source of infection. They can help  healthcare providers diagnose your illness. For instance:   Swollen lymph nodes around the jaw might be caused by an infection in the mouth.  Enlarged glands in the neck often signal a throat infection.  Lymph nodes that are swollen in more than one area often indicate an illness caused by a virus. Your caregiver most likely will know what is causing your lymphadenopathy after listening to your history and examining you. Blood tests, x-rays or other tests may be needed. If the cause of the enlarged lymph node cannot be found, and it does not go away by itself, then a biopsy may be needed. Your caregiver will discuss this with you. TREATMENT  Treatment for your enlarged lymph nodes will depend on the cause. Many times the nodes will shrink to normal size by themselves, with no treatment. Antibiotics or other medicines may be needed for infection. Only take over-the-counter or prescription medicines for pain, discomfort or fever as directed by your caregiver. HOME CARE INSTRUCTIONS  Swollen lymph glands usually return to normal when the underlying medical condition goes away. If they persist, contact your health-care provider. He/she might prescribe antibiotics or other treatments, depending on the diagnosis. Take any medications exactly as prescribed. Keep any follow-up appointments made to check on the condition of your enlarged nodes.  SEEK MEDICAL CARE IF:   Swelling lasts for more than two weeks.  You have symptoms such as weight loss, night sweats, fatigue or fever that does not go away.  The lymph nodes are hard, seem fixed to the skin or are growing rapidly.  Skin over the lymph nodes  is red and inflamed. This could mean there is an infection. SEEK IMMEDIATE MEDICAL CARE IF:   Fluid starts leaking from the area of the enlarged lymph node.  You develop a fever of 102 F (38.9 C) or greater.  Severe pain develops (not necessarily at the site of a large lymph node).  You develop  chest pain or shortness of breath.  You develop worsening abdominal pain. MAKE SURE YOU:   Understand these instructions.  Will watch your condition.  Will get help right away if you are not doing well or get worse. Document Released: 11/07/2007 Document Revised: 04/22/2011 Document Reviewed: 11/07/2007 [facility] Patient Information 2014 [place].

## 2013-08-06 ENCOUNTER — Encounter (HOSPITAL_COMMUNITY): Payer: Self-pay | Admitting: Emergency Medicine

## 2013-08-06 ENCOUNTER — Emergency Department (HOSPITAL_COMMUNITY)
Admission: EM | Admit: 2013-08-06 | Discharge: 2013-08-06 | Disposition: A | Discharge status: Home / Self Care | Payer: Medicare Other | Attending: Emergency Medicine | Admitting: Emergency Medicine

## 2013-08-06 ENCOUNTER — Emergency Department (HOSPITAL_COMMUNITY): Payer: Medicare Other

## 2013-08-06 DIAGNOSIS — I10 Essential (primary) hypertension: Secondary | ICD-10-CM | POA: Diagnosis not present

## 2013-08-06 DIAGNOSIS — Z8619 Personal history of other infectious and parasitic diseases: Secondary | ICD-10-CM | POA: Diagnosis not present

## 2013-08-06 DIAGNOSIS — R059 Cough, unspecified: Secondary | ICD-10-CM | POA: Diagnosis not present

## 2013-08-06 DIAGNOSIS — Z87891 Personal history of nicotine dependence: Secondary | ICD-10-CM | POA: Insufficient documentation

## 2013-08-06 DIAGNOSIS — J45909 Unspecified asthma, uncomplicated: Secondary | ICD-10-CM | POA: Diagnosis not present

## 2013-08-06 DIAGNOSIS — J45901 Unspecified asthma with (acute) exacerbation: Secondary | ICD-10-CM

## 2013-08-06 DIAGNOSIS — R071 Chest pain on breathing: Secondary | ICD-10-CM | POA: Diagnosis not present

## 2013-08-06 DIAGNOSIS — Z862 Personal history of diseases of the blood and blood-forming organs and certain disorders involving the immune mechanism: Secondary | ICD-10-CM | POA: Diagnosis not present

## 2013-08-06 DIAGNOSIS — Z8739 Personal history of other diseases of the musculoskeletal system and connective tissue: Secondary | ICD-10-CM | POA: Insufficient documentation

## 2013-08-06 DIAGNOSIS — R079 Chest pain, unspecified: Secondary | ICD-10-CM

## 2013-08-06 DIAGNOSIS — R05 Cough: Secondary | ICD-10-CM | POA: Diagnosis not present

## 2013-08-06 DIAGNOSIS — Z79899 Other long term (current) drug therapy: Secondary | ICD-10-CM | POA: Insufficient documentation

## 2013-08-06 HISTORY — DX: Other seasonal allergic rhinitis: J30.2

## 2013-08-06 HISTORY — DX: Sickle-cell trait: D57.3

## 2013-08-06 LAB — CBC WITH DIFFERENTIAL/PLATELET
Basophils Absolute: 0 10*3/uL (ref 0.0–0.1)
Basophils Relative: 0 % (ref 0–1)
Eosinophils Absolute: 0.2 10*3/uL (ref 0.0–0.7)
Eosinophils Relative: 2 % (ref 0–5)
HCT: 32.5 % — ABNORMAL LOW (ref 36.0–46.0)
Hemoglobin: 10.8 g/dL — ABNORMAL LOW (ref 12.0–15.0)
Lymphocytes Relative: 23 % (ref 12–46)
Lymphs Abs: 2.4 10*3/uL (ref 0.7–4.0)
MCH: 20.8 pg — ABNORMAL LOW (ref 26.0–34.0)
MCHC: 33.2 g/dL (ref 30.0–36.0)
MCV: 62.5 fL — ABNORMAL LOW (ref 78.0–100.0)
Monocytes Absolute: 0.6 10*3/uL (ref 0.1–1.0)
Monocytes Relative: 6 % (ref 3–12)
Neutro Abs: 7.4 10*3/uL (ref 1.7–7.7)
Neutrophils Relative %: 69 % (ref 43–77)
Platelets: 353 10*3/uL (ref 150–400)
RBC: 5.2 MIL/uL — ABNORMAL HIGH (ref 3.87–5.11)
RDW: 19.2 % — ABNORMAL HIGH (ref 11.5–15.5)
WBC: 10.6 10*3/uL — ABNORMAL HIGH (ref 4.0–10.5)

## 2013-08-06 LAB — BASIC METABOLIC PANEL
BUN: 9 mg/dL (ref 6–23)
CO2: 25 mEq/L (ref 19–32)
Calcium: 9.7 mg/dL (ref 8.4–10.5)
Chloride: 98 mEq/L (ref 96–112)
Creatinine, Ser: 0.84 mg/dL (ref 0.50–1.10)
GFR calc Af Amer: >90 mL/min (ref >90)
GFR calc non Af Amer: 83 mL/min — ABNORMAL LOW (ref >90)
Glucose, Bld: 81 mg/dL (ref 70–99)
Potassium: 3.7 mEq/L (ref 3.7–5.3)
Sodium: 137 mEq/L (ref 137–147)

## 2013-08-06 LAB — I-STAT TROPONIN, ED: Troponin i, poc: 0 ng/mL (ref 0.00–0.08)

## 2013-08-06 MED ORDER — PREDNISONE 20 MG PO TABS: 40.0000 mg | ORAL_TABLET | Freq: Every day | ORAL | Status: DC

## 2013-08-06 MED ORDER — ALBUTEROL SULFATE (2.5 MG/3ML) 0.083% IN NEBU: 5.0000 mg | INHALATION_SOLUTION | Freq: Four times a day (QID) | RESPIRATORY_TRACT | Status: DC | PRN

## 2013-08-06 MED ORDER — ALBUTEROL SULFATE HFA 108 (90 BASE) MCG/ACT IN AERS
1.0000 | INHALATION_SPRAY | Freq: Four times a day (QID) | RESPIRATORY_TRACT | Status: DC | PRN
Start: 2013-08-06 — End: 2013-08-28

## 2013-08-06 MED ORDER — IPRATROPIUM-ALBUTEROL 0.5-2.5 (3) MG/3ML IN SOLN
3.0000 mL | Freq: Once | RESPIRATORY_TRACT | Status: AC
  Administered 2013-08-06: 3 mL via RESPIRATORY_TRACT
  Filled 2013-08-06: qty 3

## 2013-08-06 MED ORDER — PREDNISONE 20 MG PO TABS
60.0000 mg | ORAL_TABLET | Freq: Once | ORAL | Status: AC
  Administered 2013-08-06: 60 mg via ORAL
  Filled 2013-08-06: qty 3

## 2013-08-06 NOTE — ED Provider Notes (Signed)
Medical screening examination/treatment/procedure(s) were performed by non-physician practitioner and as supervising physician I was immediately available for consultation/collaboration.  Richarda Blade, MD 08/06/13 [phone]

## 2013-08-06 NOTE — ED Provider Notes (Signed)
CSN: [id number]     Arrival date & time 08/06/13  1527 History   First MD Initiated Contact with Patient 08/06/13 1543     Chief Complaint  Patient presents with  . Chest Pain     (Consider location/radiation/quality/duration/timing/severity/associated sxs/prior Treatment) Patient is a 48 y.o. female presenting with chest pain. The history is provided by the patient and medical records.  Chest Pain  This is a 48 y.o. F with PMH significant for HTN, sarcoidosis, asthma, presenting to the ED for chest pain, onset last night.  Pain described as a tightness with intermittent sharp, stabbing sensations.  Pain worse with taking a deep breath or moving.  States she also has some pain of her right shoulder and right upper back, also worse with movement.  She denies any current SOB, palpitations, dizziness, weakness, lightheadedness, nausea, vomiting.  Pt has no prior cardiac hx.  Does have family hx of CAD and MI.  Pt is a former smoker.  Denies recent surgeries, travel, prolonged immobilization, LE edema, or calf pain.  No prior hx of DVT or PE.  Pt does admit to repetitive heavy lifting, mostly with right shoulder-- has been caring for her 42 month old grandson.  She has gotten some relief with oxycodone and flexeril.  Pt has also been using her albuterol inhaler, last use was just PTA and now inhaler is empty.  She has not used her nebulizer machine.  VS stable on arrival.  Past Medical History  Diagnosis Date  . Hypertension   . Sarcoidosis   . Fibroid   . Asthma   . Scoliosis   . Sickle cell trait   . Seasonal allergies    Past Surgical History  Procedure Laterality Date  . Axillary lymph node dissection    . Cholecystectomy     No family history on file. History  Substance Use Topics  . Smoking status: Former Smoker    Types: Cigarettes  . Smokeless tobacco: Not on file  . Alcohol Use: Yes     Comment: occasional    OB History   Grav Para Term Preterm Abortions TAB SAB Ect Mult  Living                 Review of Systems  Cardiovascular: Positive for chest pain.  Musculoskeletal: Positive for arthralgias.  All other systems reviewed and are negative.   Allergies  Ivp dye  Home Medications   Prior to Admission medications   Medication Sig Start Date End Date Taking? Authorizing Provider  albuterol (PROVENTIL HFA;VENTOLIN HFA) 108 (90 BASE) MCG/ACT inhaler Inhale 2 puffs into the lungs every 6 (six) hours as needed for wheezing.   Yes Historical Provider, MD  albuterol (PROVENTIL) (2.5 MG/3ML) 0.083% nebulizer solution Take 2.5 mg by nebulization every 6 (six) hours as needed for wheezing.   Yes Historical Provider, MD  amLODipine (NORVASC) 10 MG tablet Take 10 mg by mouth daily.   Yes Historical Provider, MD  Ascorbic Acid (VITAMIN C) 1000 MG tablet Take 1,000 mg by mouth daily.   Yes Historical Provider, MD  aspirin-sod bicarb-citric acid (ALKA-SELTZER) 325 MG TBEF tablet Take 325 mg by mouth every 6 (six) hours as needed (for congestion).   Yes Historical Provider, MD  cetirizine (ZYRTEC) 10 MG tablet Take 10 mg by mouth daily as needed for allergies.   Yes Historical Provider, MD  doxycycline (VIBRA-TABS) 100 MG tablet Take 100 mg by mouth 2 (two) times daily as needed. For flare ups  Yes Historical Provider, MD  esomeprazole (NEXIUM) 40 MG capsule Take 40 mg by mouth daily before breakfast.   Yes Historical Provider, MD  ibuprofen (ADVIL,MOTRIN) 200 MG tablet Take 3 tablets (600 mg total) by mouth daily as needed (for swelling). 01/12/13  Yes Antonietta Breach, PA-C  LORazepam (ATIVAN) 1 MG tablet Take 1 mg by mouth 2 (two) times daily.   Yes Historical Provider, MD  Multiple Vitamins-Minerals (MULTIVITAMINS THER. W/MINERALS) TABS Take 1 tablet by mouth daily.   Yes Historical Provider, MD  oxyCODONE-acetaminophen (PERCOCET) 10-325 MG per tablet Take 1 tablet by mouth every 4 (four) hours as needed for pain. 01/12/13  Yes Antonietta Breach, PA-C  potassium chloride  (K-DUR,KLOR-CON) 10 MEQ tablet Take 10 mEq by mouth daily.   Yes Historical Provider, MD  promethazine (PHENERGAN) 25 MG tablet Take 25 mg by mouth every 6 (six) hours as needed for nausea or vomiting.   Yes Historical Provider, MD   BP 149/93  Pulse 87  Temp(Src) 97.9 F (36.6 C)  Resp 14  Ht 5\' 7"  (1.702 m)  Wt 217 lb (98.431 kg)  BMI 33.98 kg/m2  SpO2 100%  LMP 07/16/2013  Physical Exam  Nursing note and vitals reviewed. Constitutional: She is oriented to person, place, and time. She appears well-developed and well-nourished. No distress.  HENT:  Head: Normocephalic and atraumatic.  Right Ear: Tympanic membrane and ear canal normal.  Left Ear: Tympanic membrane and ear canal normal.  Nose: Nose normal.  Mouth/Throat: Uvula is midline, oropharynx is clear and moist and mucous membranes are normal.  Eyes: Conjunctivae and EOM are normal. Pupils are equal, round, and reactive to light.  Neck: Normal range of motion. Neck supple.  Cardiovascular: Normal rate, regular rhythm and normal heart sounds.   Pulmonary/Chest: Effort normal. She has wheezes. She has no rhonchi. She exhibits tenderness and bony tenderness.    Pain reproducible with palpation to right anterior and posterior chest wall; no bony deformities noted Respirations unlabored; slight expiratory wheezes noted throughout; speaking in full complete sentences without difficulty  Abdominal: Soft. Bowel sounds are normal. There is no tenderness. There is no guarding.  Musculoskeletal: Normal range of motion. She exhibits no edema.  Neurological: She is alert and oriented to person, place, and time.  Skin: Skin is warm and dry. She is not diaphoretic.  Psychiatric: She has a normal mood and affect.    ED Course  Procedures (including critical care time) Labs Review Labs Reviewed  CBC WITH DIFFERENTIAL - Abnormal; Notable for the following:    WBC 10.6 (*)    RBC 5.20 (*)    Hemoglobin 10.8 (*)    HCT 32.5 (*)     MCV 62.5 (*)    MCH 20.8 (*)    RDW 19.2 (*)    All other components within normal limits  BASIC METABOLIC PANEL - Abnormal; Notable for the following:    GFR calc non Af Amer 83 (*)    All other components within normal limits  I-STAT TROPOININ, ED    Imaging Review Dg Chest 2 View  08/06/2013   CLINICAL DATA:  Sharp right-sided chest pain with cough and congestion. History of sarcoid and sickle cell.  EXAM: CHEST  2 VIEW  COMPARISON:  10/01/2012.  FINDINGS: Trachea is midline. Heart size normal. Lungs are clear. No pleural fluid.  IMPRESSION: No acute findings.   Electronically Signed   By: Lorin Picket M.D.   On: 08/06/2013 17:13     EKG Interpretation  Date/Time:  Friday August 06 2013 15:32:06 EDT Ventricular Rate:  86 PR Interval:  166 QRS Duration: 98 QT Interval:  390 QTC Calculation: 466 R Axis:   77 Text Interpretation:  Sinus rhythm Consider left atrial enlargement RSR'  in V1 or V2, right VCD or RVH since last tracing no significant change  Confirmed by [facility]  MD, ELLIOTT [phone]) on 08/06/2013 4:17:55 PM      MDM   Final diagnoses:  Chest pain, unspecified chest pain type  Asthma exacerbation, mild   48 y.o. F with chest tightness and right shoulder pain, onset last night.  She has hx of asthma and admits to repetitive heavy lifting.  Pain relieved with flexeril.  On exam, she appears comfortable and is in no distress.  Her pain is reproducible with palpation to her right anterior chest wall.  Her respirations are unlabored but she does have expiratory wheezes.  Will obtain EKG, CXR, labs.  Dose of prednisone and duoneb given.  Will reassess shortly.  EKG sinus rhythm without ischemic change. Troponin is negative. Chest x-ray is clear. Labs are reassuring. After prednisone and DuoNeb, patient states her pain has resolved. She is now lying comfortably on her right shoulder without any current pain.  At this time i have low suspicion for ACS, PE, dissection, or other  acute cardiac event.  Her sx are likely due in part to her asthma with MSK component.  Will start on scheduled neb treatments, prednisone, and refill albuterol.  FU with PCP this week.  Discussed plan with patient, he/she acknowledged understanding and agreed with plan of care.  Return precautions given for new or worsening symptoms.  Larene Pickett, PA-C 08/06/13 2224

## 2013-08-06 NOTE — Discharge Instructions (Signed)
Your work-up including EKG, labs, and chest x-ray were normal today. Start prednisone tomorrow, you were given today's dose in the ED. Use nebulizer treatments every 6 hours for the next 1-2 days, then use as needed. Use albuterol inhaler for rescue only. Follow-up with Dr. Alyson Ingles. Return to the ED for new concerns.

## 2013-08-06 NOTE — ED Notes (Signed)
Pt arrived by [facility] from home with c/o CP that started yesterday evening along with nausea. Denies nausea at this time. CP increases when taking a deep breath or moving. Pain is central and radiates to right arm and back. Pt took flexeril last night and phenergan to help with symptoms. BP-168/95 HR-90reg CBG-69.

## 2013-08-06 NOTE — ED Notes (Signed)
Pt back from x-ray.

## 2013-08-28 ENCOUNTER — Emergency Department (HOSPITAL_COMMUNITY)
Admission: EM | Admit: 2013-08-28 | Discharge: 2013-08-29 | Disposition: A | Discharge status: Home / Self Care | Payer: No Typology Code available for payment source | Attending: Emergency Medicine | Admitting: Emergency Medicine

## 2013-08-28 ENCOUNTER — Encounter (HOSPITAL_COMMUNITY): Payer: Self-pay | Admitting: Emergency Medicine

## 2013-08-28 ENCOUNTER — Emergency Department (HOSPITAL_COMMUNITY): Payer: No Typology Code available for payment source

## 2013-08-28 DIAGNOSIS — S139XXA Sprain of joints and ligaments of unspecified parts of neck, initial encounter: Secondary | ICD-10-CM | POA: Insufficient documentation

## 2013-08-28 DIAGNOSIS — Z79899 Other long term (current) drug therapy: Secondary | ICD-10-CM | POA: Insufficient documentation

## 2013-08-28 DIAGNOSIS — S46912A Strain of unspecified muscle, fascia and tendon at shoulder and upper arm level, left arm, initial encounter: Secondary | ICD-10-CM

## 2013-08-28 DIAGNOSIS — Z792 Long term (current) use of antibiotics: Secondary | ICD-10-CM | POA: Insufficient documentation

## 2013-08-28 DIAGNOSIS — Z7982 Long term (current) use of aspirin: Secondary | ICD-10-CM | POA: Insufficient documentation

## 2013-08-28 DIAGNOSIS — S4980XA Other specified injuries of shoulder and upper arm, unspecified arm, initial encounter: Secondary | ICD-10-CM | POA: Diagnosis not present

## 2013-08-28 DIAGNOSIS — Y92410 Unspecified street and highway as the place of occurrence of the external cause: Secondary | ICD-10-CM | POA: Insufficient documentation

## 2013-08-28 DIAGNOSIS — IMO0002 Reserved for concepts with insufficient information to code with codable children: Secondary | ICD-10-CM | POA: Insufficient documentation

## 2013-08-28 DIAGNOSIS — I10 Essential (primary) hypertension: Secondary | ICD-10-CM | POA: Insufficient documentation

## 2013-08-28 DIAGNOSIS — J45909 Unspecified asthma, uncomplicated: Secondary | ICD-10-CM | POA: Insufficient documentation

## 2013-08-28 DIAGNOSIS — S161XXA Strain of muscle, fascia and tendon at neck level, initial encounter: Secondary | ICD-10-CM

## 2013-08-28 DIAGNOSIS — S199XXA Unspecified injury of neck, initial encounter: Secondary | ICD-10-CM | POA: Diagnosis not present

## 2013-08-28 DIAGNOSIS — Y9389 Activity, other specified: Secondary | ICD-10-CM | POA: Insufficient documentation

## 2013-08-28 DIAGNOSIS — M542 Cervicalgia: Secondary | ICD-10-CM | POA: Diagnosis not present

## 2013-08-28 DIAGNOSIS — Z9089 Acquired absence of other organs: Secondary | ICD-10-CM | POA: Insufficient documentation

## 2013-08-28 DIAGNOSIS — S46909A Unspecified injury of unspecified muscle, fascia and tendon at shoulder and upper arm level, unspecified arm, initial encounter: Secondary | ICD-10-CM | POA: Diagnosis not present

## 2013-08-28 DIAGNOSIS — S0993XA Unspecified injury of face, initial encounter: Secondary | ICD-10-CM | POA: Diagnosis not present

## 2013-08-28 DIAGNOSIS — Z87891 Personal history of nicotine dependence: Secondary | ICD-10-CM | POA: Insufficient documentation

## 2013-08-28 DIAGNOSIS — Z862 Personal history of diseases of the blood and blood-forming organs and certain disorders involving the immune mechanism: Secondary | ICD-10-CM | POA: Insufficient documentation

## 2013-08-28 DIAGNOSIS — M25519 Pain in unspecified shoulder: Secondary | ICD-10-CM | POA: Diagnosis not present

## 2013-08-28 MED ORDER — HYDROCODONE-ACETAMINOPHEN 5-325 MG PO TABS: 1.0000 | ORAL_TABLET | Freq: Four times a day (QID) | ORAL | Status: DC | PRN

## 2013-08-28 MED ORDER — IBUPROFEN 800 MG PO TABS: 800.0000 mg | ORAL_TABLET | Freq: Three times a day (TID) | ORAL | Status: DC | PRN
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
## 2013-08-28 NOTE — ED Notes (Signed)
Returned from xray

## 2013-08-28 NOTE — Discharge Instructions (Signed)
Return here as needed.  Followup with your Dr. use ice and heat on your neck and shoulder

## 2013-08-28 NOTE — ED Notes (Signed)
Patient transported to X-ray 
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
## 2013-08-29 NOTE — ED Provider Notes (Signed)
Medical screening examination/treatment/procedure(s) were performed by non-physician practitioner and as supervising physician I was immediately available for consultation/collaboration.   EKG Interpretation None       Babette Relic, MD 08/29/13 1328

## 2013-12-23 ENCOUNTER — Emergency Department (INDEPENDENT_AMBULATORY_CARE_PROVIDER_SITE_OTHER)
Admission: EM | Admit: 2013-12-23 | Discharge: 2013-12-23 | Disposition: A | Discharge status: Home / Self Care | Payer: Medicare Other | Source: Home / Self Care | Attending: Family Medicine | Admitting: Family Medicine

## 2013-12-23 ENCOUNTER — Encounter (HOSPITAL_COMMUNITY): Payer: Self-pay | Admitting: Emergency Medicine

## 2013-12-23 DIAGNOSIS — D573 Sickle-cell trait: Secondary | ICD-10-CM | POA: Diagnosis not present

## 2013-12-23 DIAGNOSIS — M2550 Pain in unspecified joint: Secondary | ICD-10-CM

## 2013-12-23 DIAGNOSIS — D869 Sarcoidosis, unspecified: Secondary | ICD-10-CM

## 2013-12-23 MED ORDER — TRIAMCINOLONE ACETONIDE 40 MG/ML IJ SUSP
60.0000 mg | Freq: Once | INTRAMUSCULAR | Status: AC
  Administered 2013-12-23: 60 mg via INTRAMUSCULAR

## 2013-12-23 MED ORDER — TRIAMCINOLONE ACETONIDE 40 MG/ML IJ SUSP
INTRAMUSCULAR | Status: AC
  Filled 2013-12-23: qty 1

## 2013-12-23 NOTE — ED Provider Notes (Signed)
CSN: [id number]     Arrival date & time 12/23/13  1027 History   First MD Initiated Contact with Patient 12/23/13 [phone]     Chief Complaint  Patient presents with  . Muscle Pain   (Consider location/radiation/quality/duration/timing/severity/associated sxs/prior Treatment) HPI Comments:  C/O polyarthralgia's. Had this last yr and tx with a "cortisone shot". She was better for a yr. Has autoimmune dz and attributes her pain to this.   Past Medical History  Diagnosis Date  . Hypertension   . Sarcoidosis   . Fibroid   . Asthma   . Scoliosis   . Sickle cell trait   . Seasonal allergies    Past Surgical History  Procedure Laterality Date  . Axillary lymph node dissection    . Cholecystectomy     No family history on file. History  Substance Use Topics  . Smoking status: Former Smoker    Types: Cigarettes  . Smokeless tobacco: Not on file  . Alcohol Use: Yes     Comment: occasional    OB History    No data available     Review of Systems  Constitutional: Positive for fatigue. Negative for fever.  HENT: Negative.   Respiratory: Negative.   Cardiovascular: Negative.   Gastrointestinal: Negative.   Genitourinary: Negative.   Musculoskeletal: Positive for arthralgias.       Elbows, knees and lower back.  Skin: Negative for rash.  Neurological: Negative.     Allergies  Ivp dye  Home Medications   Prior to Admission medications   Medication Sig Start Date End Date Taking? Authorizing Provider  albuterol (PROVENTIL HFA;VENTOLIN HFA) 108 (90 BASE) MCG/ACT inhaler Inhale 2 puffs into the lungs every 6 (six) hours as needed for wheezing.    Historical Provider, MD  albuterol (PROVENTIL) (2.5 MG/3ML) 0.083% nebulizer solution Take 2.5 mg by nebulization every 6 (six) hours as needed for wheezing.    Historical Provider, MD  amLODipine (NORVASC) 10 MG tablet Take 10 mg by mouth daily.    Historical Provider, MD  Ascorbic Acid (VITAMIN C) 1000 MG tablet Take 1,000 mg by  mouth daily.    Historical Provider, MD  aspirin-sod bicarb-citric acid (ALKA-SELTZER) 325 MG TBEF tablet Take 325 mg by mouth every 6 (six) hours as needed (for congestion).    Historical Provider, MD  cetirizine (ZYRTEC) 10 MG tablet Take 10 mg by mouth daily as needed for allergies.    Historical Provider, MD  doxycycline (VIBRA-TABS) 100 MG tablet Take 100 mg by mouth 2 (two) times daily as needed. For flare ups    Historical Provider, MD  esomeprazole (NEXIUM) 40 MG capsule Take 40 mg by mouth daily before breakfast.    Historical Provider, MD  HYDROcodone-acetaminophen (NORCO/VICODIN) 5-325 MG per tablet Take 1 tablet by mouth every 6 (six) hours as needed for moderate pain. 08/28/13   Resa Miner Lawyer, PA-C  ibuprofen (ADVIL,MOTRIN) 200 MG tablet Take 3 tablets (600 mg total) by mouth daily as needed (for swelling). 01/12/13   Antonietta Breach, PA-C  ibuprofen (ADVIL,MOTRIN) 800 MG tablet Take 1 tablet (800 mg total) by mouth every 8 (eight) hours as needed. 08/28/13   Resa Miner Lawyer, PA-C  LORazepam (ATIVAN) 1 MG tablet Take 1 mg by mouth 2 (two) times daily.    Historical Provider, MD  Multiple Vitamins-Minerals (MULTIVITAMINS THER. W/MINERALS) TABS Take 1 tablet by mouth daily.    Historical Provider, MD  oxyCODONE-acetaminophen (PERCOCET) 10-325 MG per tablet Take 1 tablet by mouth every 4 (  four) hours as needed for pain. 01/12/13   Antonietta Breach, PA-C  potassium chloride (K-DUR,KLOR-CON) 10 MEQ tablet Take 10 mEq by mouth daily.    Historical Provider, MD  promethazine (PHENERGAN) 25 MG tablet Take 25 mg by mouth every 6 (six) hours as needed for nausea or vomiting.    Historical Provider, MD   BP 176/102 mmHg  Pulse 79  Temp(Src) 98.5 F (36.9 C) (Oral)  Resp 16  SpO2 100% Physical Exam  Constitutional: She is oriented to person, place, and time. She appears well-developed and well-nourished. No distress.  Eyes: EOM are normal.  Neck: Normal range of motion. Neck supple.   Cardiovascular: Normal rate, regular rhythm, normal heart sounds and intact distal pulses.   Pulmonary/Chest: Effort normal and breath sounds normal. No respiratory distress. She has no wheezes. She has no rales.  Musculoskeletal: Normal range of motion. She exhibits no edema or tenderness.  No joint swelling or erythema. Minor tenderness. Full ROM Nl distal pulses  Lymphadenopathy:    She has no cervical adenopathy.  Neurological: She is alert and oriented to person, place, and time. She exhibits normal muscle tone.  Skin: Skin is warm and dry.  Psychiatric: She has a normal mood and affect.  Nursing note and vitals reviewed.   ED Course  Procedures (including critical care time) Labs Review Labs Reviewed - No data to display  Imaging Review No results found.   MDM   1. Polyarthralgia   2. Sarcoidosis   3. Sickle cell trait     Kenalog 60 mg IM F/U with PCP     Janne Napoleon, NP 12/23/13 1022

## 2013-12-23 NOTE — Discharge Instructions (Signed)

## 2013-12-24 ENCOUNTER — Telehealth: Payer: Self-pay | Admitting: Obstetrics

## 2013-12-28 NOTE — Telephone Encounter (Signed)
11.17.2015 - Still unable to reach patientt brm

## 2014-01-08 ENCOUNTER — Encounter: Payer: Self-pay | Admitting: *Deleted

## 2014-01-28 ENCOUNTER — Encounter (HOSPITAL_COMMUNITY): Payer: Self-pay | Admitting: *Deleted

## 2014-01-28 ENCOUNTER — Emergency Department (INDEPENDENT_AMBULATORY_CARE_PROVIDER_SITE_OTHER)
Admission: EM | Admit: 2014-01-28 | Discharge: 2014-01-28 | Disposition: A | Discharge status: Home / Self Care | Payer: Medicare Other | Source: Home / Self Care | Attending: Family Medicine | Admitting: Family Medicine

## 2014-01-28 DIAGNOSIS — T148 Other injury of unspecified body region: Secondary | ICD-10-CM

## 2014-01-28 DIAGNOSIS — T148XXA Other injury of unspecified body region, initial encounter: Secondary | ICD-10-CM

## 2014-01-28 DIAGNOSIS — I10 Essential (primary) hypertension: Secondary | ICD-10-CM

## 2014-01-28 MED ORDER — CYCLOBENZAPRINE HCL 5 MG PO TABS
5.0000 mg | ORAL_TABLET | Freq: Three times a day (TID) | ORAL | Status: DC | PRN
Start: 2014-01-28 — End: 2015-08-16

## 2014-01-28 MED ORDER — TRIAMCINOLONE ACETONIDE 40 MG/ML IJ SUSP
60.0000 mg | Freq: Once | INTRAMUSCULAR | Status: AC
  Administered 2014-01-28: 60 mg via INTRAMUSCULAR

## 2014-01-28 MED ORDER — TRIAMCINOLONE ACETONIDE 40 MG/ML IJ SUSP
INTRAMUSCULAR | Status: AC
  Filled 2014-01-28: qty 1

## 2014-01-28 NOTE — ED Provider Notes (Signed)
CSN: [id number]     Arrival date & time 01/28/14  1013 History   None    Chief Complaint  Patient presents with  . Back Pain   (Consider location/radiation/quality/duration/timing/severity/associated sxs/prior Treatment) HPI Comments: Pt reports similar pain a month or so ago. Came here, received "cortisone shot" that completely resolved sx. Pt requests cortisone shot again. Pt received IM Kenalog 60mg  12/23/2013.   Patient is a 48 y.o. female presenting with back pain. The history is provided by the patient.  Back Pain Location:  Lumbar spine Quality:  Aching and cramping Radiates to: B hips/buttocks. Pain severity:  Severe Pain is:  Same all the time Onset quality:  Gradual Duration:  2 days Timing:  Constant Progression:  Worsening Chronicity:  Recurrent Context comment:  New exercise program Relieved by:  Nothing Worsened by:  Movement Ineffective treatments:  Narcotics Associated symptoms: no abdominal pain, no bladder incontinence, no bowel incontinence, no fever, no numbness, no paresthesias, no perianal numbness, no tingling and no weakness     Past Medical History  Diagnosis Date  . Hypertension   . Sarcoidosis   . Fibroid   . Asthma   . Scoliosis   . Sickle cell trait   . Seasonal allergies    Past Surgical History  Procedure Laterality Date  . Axillary lymph node dissection    . Cholecystectomy     History reviewed. No pertinent family history. History  Substance Use Topics  . Smoking status: Former Smoker    Types: Cigarettes  . Smokeless tobacco: Not on file  . Alcohol Use: Yes     Comment: occasional    OB History    No data available     Review of Systems  Constitutional: Negative for fever.  Gastrointestinal: Negative for abdominal pain and bowel incontinence.  Genitourinary: Negative for bladder incontinence.  Musculoskeletal: Positive for back pain.  Neurological: Negative for tingling, weakness, numbness and paresthesias.     Allergies  Ivp dye  Home Medications   Prior to Admission medications   Medication Sig Start Date End Date Taking? Authorizing Provider  albuterol (PROVENTIL HFA;VENTOLIN HFA) 108 (90 BASE) MCG/ACT inhaler Inhale 2 puffs into the lungs every 6 (six) hours as needed for wheezing.   Yes Historical Provider, MD  albuterol (PROVENTIL) (2.5 MG/3ML) 0.083% nebulizer solution Take 2.5 mg by nebulization every 6 (six) hours as needed for wheezing.   Yes Historical Provider, MD  amLODipine (NORVASC) 10 MG tablet Take 10 mg by mouth daily.   Yes Historical Provider, MD  amoxicillin-clavulanate (AUGMENTIN) 875-125 MG per tablet Take 1 tablet by mouth 2 (two) times daily.   Yes Historical Provider, MD  Ascorbic Acid (VITAMIN C) 1000 MG tablet Take 1,000 mg by mouth daily.   Yes Historical Provider, MD  aspirin-sod bicarb-citric acid (ALKA-SELTZER) 325 MG TBEF tablet Take 325 mg by mouth every 6 (six) hours as needed (for congestion).   Yes Historical Provider, MD  cetirizine (ZYRTEC) 10 MG tablet Take 10 mg by mouth daily as needed for allergies.   Yes Historical Provider, MD  doxycycline (VIBRA-TABS) 100 MG tablet Take 100 mg by mouth 2 (two) times daily as needed. For flare ups   Yes Historical Provider, MD  esomeprazole (NEXIUM) 40 MG capsule Take 40 mg by mouth daily before breakfast.   Yes Historical Provider, MD  HYDROcodone-acetaminophen (NORCO/VICODIN) 5-325 MG per tablet Take 1 tablet by mouth every 6 (six) hours as needed for moderate pain. 08/28/13  Yes Brent General, PA-C  ibuprofen (ADVIL,MOTRIN) 200 MG tablet Take 3 tablets (600 mg total) by mouth daily as needed (for swelling). 01/12/13  Yes Antonietta Breach, PA-C  ibuprofen (ADVIL,MOTRIN) 800 MG tablet Take 1 tablet (800 mg total) by mouth every 8 (eight) hours as needed. 08/28/13  Yes Resa Miner Lawyer, PA-C  LORazepam (ATIVAN) 1 MG tablet Take 1 mg by mouth 2 (two) times daily.   Yes Historical Provider, MD  Multiple  Vitamins-Minerals (MULTIVITAMINS THER. W/MINERALS) TABS Take 1 tablet by mouth daily.   Yes Historical Provider, MD  oxyCODONE-acetaminophen (PERCOCET) 10-325 MG per tablet Take 1 tablet by mouth every 4 (four) hours as needed for pain. 01/12/13  Yes Antonietta Breach, PA-C  potassium chloride (K-DUR,KLOR-CON) 10 MEQ tablet Take 10 mEq by mouth daily.   Yes Historical Provider, MD  promethazine (PHENERGAN) 25 MG tablet Take 25 mg by mouth every 6 (six) hours as needed for nausea or vomiting.   Yes Historical Provider, MD  cyclobenzaprine (FLEXERIL) 5 MG tablet Take 1 tablet (5 mg total) by mouth 3 (three) times daily as needed for muscle spasms. 01/28/14   Carvel Getting, NP   BP 168/109 mmHg  Pulse 88  Temp(Src) 98.1 F (36.7 C) (Oral)  Resp 16  SpO2 98%  LMP 01/11/2014 Physical Exam  Constitutional: She is oriented to person, place, and time. She appears well-developed and well-nourished.  Appears in pain. Crying.   Musculoskeletal:       Thoracic back: Normal.       Lumbar back: She exhibits tenderness. She exhibits normal range of motion, no bony tenderness, no swelling, no deformity and no spasm.  Neurological: She is alert and oriented to person, place, and time. Gait normal.    ED Course  Procedures (including critical care time) Labs Review Labs Reviewed - No data to display  Imaging Review No results found.   MDM   1. Muscle strain   2. Essential hypertension    Kenalog 60mg  IM here. Rx flexeril 5mg  po TID prn. Pt to f/u with pcp about htn.     Carvel Getting, NP 01/28/14 1116

## 2014-01-28 NOTE — Discharge Instructions (Signed)
Follow up with your doctor about your blood pressure.  Muscle Strain A muscle strain is an injury that occurs when a muscle is stretched beyond its normal length. Usually a small number of muscle fibers are torn when this happens. Muscle strain is rated in degrees. First-degree strains have the least amount of muscle fiber tearing and pain. Second-degree and third-degree strains have increasingly more tearing and pain.  Usually, recovery from muscle strain takes 1-2 weeks. Complete healing takes 5-6 weeks.  CAUSES  Muscle strain happens when a sudden, violent force placed on a muscle stretches it too far. This may occur with lifting, sports, or a fall.  RISK FACTORS Muscle strain is especially common in athletes.  SIGNS AND SYMPTOMS At the site of the muscle strain, there may be:  Pain.  Bruising.  Swelling.  Difficulty using the muscle due to pain or lack of normal function. DIAGNOSIS  Your health care provider will perform a physical exam and ask about your medical history. TREATMENT  Often, the best treatment for a muscle strain is resting, icing, and applying cold compresses to the injured area.  HOME CARE INSTRUCTIONS   Use the PRICE method of treatment to promote muscle healing during the first 2-3 days after your injury. The PRICE method involves:  Protecting the muscle from being injured again.  Restricting your activity and resting the injured body part.  Icing your injury. To do this, put ice in a plastic bag. Place a towel between your skin and the bag. Then, apply the ice and leave it on from 15-20 minutes each hour. After the third day, switch to moist heat packs.  Apply compression to the injured area with a splint or elastic bandage. Be careful not to wrap it too tightly. This may interfere with blood circulation or increase swelling.  Elevate the injured body part above the level of your heart as often as you can.  Only take over-the-counter or prescription  medicines for pain, discomfort, or fever as directed by your health care provider.  Warming up prior to exercise helps to prevent future muscle strains. SEEK MEDICAL CARE IF:   You have increasing pain or swelling in the injured area.  You have numbness, tingling, or a significant loss of strength in the injured area. MAKE SURE YOU:   Understand these instructions.  Will watch your condition.  Will get help right away if you are not doing well or get worse. Document Released: 01/28/2005 Document Revised: 11/18/2012 Document Reviewed: 08/27/2012 [facility] Patient Information 2015 [place], [place]. This information is not intended to replace advice given to you by your health care provider. Make sure you discuss any questions you have with your health care provider.

## 2014-01-28 NOTE — ED Notes (Signed)
Pt reports she has arthritis in her lower back and it flares from time to time.  It started hurting 2 days ago   She has been doing an exercise class lately.  The pain today is the lower back with radiation to both hips laterally.  It is worse with movement.   She took oxycodone yesterday without much relief

## 2014-03-07 ENCOUNTER — Emergency Department (HOSPITAL_COMMUNITY): Payer: Medicare Other

## 2014-03-07 ENCOUNTER — Encounter (HOSPITAL_COMMUNITY): Payer: Self-pay | Admitting: *Deleted

## 2014-03-07 ENCOUNTER — Emergency Department (HOSPITAL_COMMUNITY)
Admission: EM | Admit: 2014-03-07 | Discharge: 2014-03-07 | Disposition: A | Discharge status: Home / Self Care | Payer: Medicare Other | Attending: Emergency Medicine | Admitting: Emergency Medicine

## 2014-03-07 DIAGNOSIS — Z79899 Other long term (current) drug therapy: Secondary | ICD-10-CM | POA: Diagnosis not present

## 2014-03-07 DIAGNOSIS — R6889 Other general symptoms and signs: Secondary | ICD-10-CM | POA: Diagnosis not present

## 2014-03-07 DIAGNOSIS — Z792 Long term (current) use of antibiotics: Secondary | ICD-10-CM | POA: Insufficient documentation

## 2014-03-07 DIAGNOSIS — Z8742 Personal history of other diseases of the female genital tract: Secondary | ICD-10-CM | POA: Diagnosis not present

## 2014-03-07 DIAGNOSIS — J45909 Unspecified asthma, uncomplicated: Secondary | ICD-10-CM | POA: Insufficient documentation

## 2014-03-07 DIAGNOSIS — R519 Headache, unspecified: Secondary | ICD-10-CM

## 2014-03-07 DIAGNOSIS — Z87891 Personal history of nicotine dependence: Secondary | ICD-10-CM | POA: Diagnosis not present

## 2014-03-07 DIAGNOSIS — Z862 Personal history of diseases of the blood and blood-forming organs and certain disorders involving the immune mechanism: Secondary | ICD-10-CM | POA: Insufficient documentation

## 2014-03-07 DIAGNOSIS — M419 Scoliosis, unspecified: Secondary | ICD-10-CM | POA: Diagnosis not present

## 2014-03-07 DIAGNOSIS — R51 Headache: Secondary | ICD-10-CM | POA: Diagnosis not present

## 2014-03-07 DIAGNOSIS — I10 Essential (primary) hypertension: Secondary | ICD-10-CM | POA: Diagnosis not present

## 2014-03-07 MED ORDER — KETOROLAC TROMETHAMINE 30 MG/ML IJ SOLN
30.0000 mg | Freq: Once | INTRAMUSCULAR | Status: AC
  Administered 2014-03-07: 30 mg via INTRAVENOUS
  Filled 2014-03-07: qty 1

## 2014-03-07 MED ORDER — METOCLOPRAMIDE HCL 5 MG/ML IJ SOLN
10.0000 mg | Freq: Once | INTRAMUSCULAR | Status: AC
  Administered 2014-03-07: 10 mg via INTRAVENOUS
  Filled 2014-03-07: qty 2

## 2014-03-07 MED ORDER — DIPHENHYDRAMINE HCL 50 MG/ML IJ SOLN
25.0000 mg | Freq: Once | INTRAMUSCULAR | Status: AC
  Administered 2014-03-07: 25 mg via INTRAVENOUS
  Filled 2014-03-07: qty 1

## 2014-03-07 MED ORDER — SODIUM CHLORIDE 0.9 % IV BOLUS (SEPSIS)
500.0000 mL | Freq: Once | INTRAVENOUS | Status: AC
  Administered 2014-03-07: 500 mL via INTRAVENOUS

## 2014-03-07 MED ORDER — HYDROCODONE-ACETAMINOPHEN 5-325 MG PO TABS
2.0000 | ORAL_TABLET | Freq: Once | ORAL | Status: AC
  Administered 2014-03-07: 2 via ORAL
  Filled 2014-03-07: qty 2

## 2014-03-07 NOTE — ED Notes (Signed)
Pt escorted to discharge window. Pt verbalized understanding discharge instructions. In no acute distress.  

## 2014-03-07 NOTE — ED Notes (Signed)
Pt alert and oriented x4. Respirations even and unlabored, bilateral symmetrical rise and fall of chest. Skin warm and dry. In no acute distress. Denies needs.   

## 2014-03-07 NOTE — ED Notes (Signed)
Bed: [facility] Expected date:  Expected time:  Means of arrival:  Comments: EMS-headache 

## 2014-03-07 NOTE — ED Notes (Signed)
Pt to CT

## 2014-03-07 NOTE — ED Provider Notes (Signed)
CSN: [id number]     Arrival date & time 03/07/14  1543 History   First MD Initiated Contact with Patient 03/07/14 1546     Chief Complaint  Patient presents with  . Headache     (Consider location/radiation/quality/duration/timing/severity/associated sxs/prior Treatment) Patient is a 49 y.o. female presenting with headaches. The history is provided by the patient, medical records and the EMS personnel. No language interpreter was used.  Headache Associated symptoms: no abdominal pain, no back pain, no cough, no diarrhea, no fatigue, no fever, no nausea, no neck stiffness and no vomiting     Crystal Bullock is a 49 y.o. female  with a hx of HTN, sarcoidosis presents to the Emergency Department complaining of gradual, persistent, progressively worsening generalized, throbbing headache onset 8am this morning.  Pt reports she has intermittent sinus headaches, but today's headache feels different.  Pt denies vision changes, neck pain, neck stiffness, fevers, chills, nausea, vomiting, diarrhea, weakness, dizziness, numbness, syncope, dysuria.  Associated symptoms include photophobia, but no phonophobia.  Pt denies any treatments PTA.  Nothing makes it better and movement and talking makes it worse.      Past Medical History  Diagnosis Date  . Hypertension   . Sarcoidosis   . Fibroid   . Asthma   . Scoliosis   . Sickle cell trait   . Seasonal allergies    Past Surgical History  Procedure Laterality Date  . Axillary lymph node dissection    . Cholecystectomy     History reviewed. No pertinent family history. History  Substance Use Topics  . Smoking status: Former Smoker    Types: Cigarettes  . Smokeless tobacco: Not on file  . Alcohol Use: Yes     Comment: occasional    OB History    No data available     Review of Systems  Constitutional: Negative for fever, diaphoresis, appetite change, fatigue and unexpected weight change.  HENT: Negative for mouth sores.   Eyes:  Negative for visual disturbance.  Respiratory: Negative for cough, chest tightness, shortness of breath and wheezing.   Cardiovascular: Negative for chest pain.  Gastrointestinal: Negative for nausea, vomiting, abdominal pain, diarrhea and constipation.  Endocrine: Negative for polydipsia, polyphagia and polyuria.  Genitourinary: Negative for dysuria, urgency, frequency and hematuria.  Musculoskeletal: Negative for back pain and neck stiffness.  Skin: Negative for rash.  Allergic/Immunologic: Negative for immunocompromised state.  Neurological: Positive for headaches. Negative for syncope and light-headedness.  Hematological: Does not bruise/bleed easily.  Psychiatric/Behavioral: Negative for sleep disturbance. The patient is not nervous/anxious.       Allergies  Ivp dye  Home Medications   Prior to Admission medications   Medication Sig Start Date End Date Taking? Authorizing Provider  albuterol (PROVENTIL HFA;VENTOLIN HFA) 108 (90 BASE) MCG/ACT inhaler Inhale 2 puffs into the lungs every 6 (six) hours as needed for wheezing (wheezing).    Yes Historical Provider, MD  albuterol (PROVENTIL) (2.5 MG/3ML) 0.083% nebulizer solution Take 2.5 mg by nebulization every 6 (six) hours as needed for wheezing (wheezing).    Yes Historical Provider, MD  amLODipine (NORVASC) 10 MG tablet Take 10 mg by mouth daily.   Yes Historical Provider, MD  amoxicillin-clavulanate (AUGMENTIN) 875-125 MG per tablet Take 1 tablet by mouth 2 (two) times daily.   Yes Historical Provider, MD  Ascorbic Acid (VITAMIN C) 1000 MG tablet Take 1,000 mg by mouth daily.   Yes Historical Provider, MD  aspirin-sod bicarb-citric acid (ALKA-SELTZER) 325 MG TBEF tablet Take 325  mg by mouth every 6 (six) hours as needed (for congestion).   Yes Historical Provider, MD  cetirizine (ZYRTEC) 10 MG tablet Take 10 mg by mouth daily as needed for allergies (allergies).    Yes Historical Provider, MD  cyclobenzaprine (FLEXERIL) 5 MG  tablet Take 1 tablet (5 mg total) by mouth 3 (three) times daily as needed for muscle spasms. 01/28/14  Yes Carvel Getting, NP  doxycycline (VIBRA-TABS) 100 MG tablet Take 100 mg by mouth 2 (two) times daily as needed (flare ups). For flare ups   Yes Historical Provider, MD  esomeprazole (NEXIUM) 40 MG capsule Take 40 mg by mouth daily before breakfast.   Yes Historical Provider, MD  HYDROcodone-acetaminophen (NORCO/VICODIN) 5-325 MG per tablet Take 1 tablet by mouth every 6 (six) hours as needed for moderate pain. 08/28/13  Yes Resa Miner Lawyer, PA-C  ibuprofen (ADVIL,MOTRIN) 200 MG tablet Take 3 tablets (600 mg total) by mouth daily as needed (for swelling). 01/12/13  Yes Antonietta Breach, PA-C  ibuprofen (ADVIL,MOTRIN) 800 MG tablet Take 1 tablet (800 mg total) by mouth every 8 (eight) hours as needed. 08/28/13  Yes Resa Miner Lawyer, PA-C  LORazepam (ATIVAN) 1 MG tablet Take 1 mg by mouth daily.    Yes Historical Provider, MD  Multiple Vitamins-Minerals (MULTIVITAMINS THER. W/MINERALS) TABS Take 1 tablet by mouth daily.   Yes Historical Provider, MD  oxyCODONE-acetaminophen (PERCOCET) 10-325 MG per tablet Take 1 tablet by mouth every 4 (four) hours as needed for pain. 01/12/13  Yes Antonietta Breach, PA-C  promethazine (PHENERGAN) 25 MG tablet Take 25 mg by mouth every 6 (six) hours as needed for nausea or vomiting (nausea & vomiting).    Yes Historical Provider, MD  potassium chloride (K-DUR,KLOR-CON) 10 MEQ tablet Take 10 mEq by mouth daily.    Historical Provider, MD   BP 160/86 mmHg  Pulse 96  Temp(Src) 98 F (36.7 C) (Oral)  Resp 16  SpO2 97%  LMP 02/28/2014 Physical Exam  Constitutional: She is oriented to person, place, and time. She appears well-developed and well-nourished. No distress.  HENT:  Head: Normocephalic and atraumatic.  Right Ear: Tympanic membrane, external ear and ear canal normal.  Left Ear: Tympanic membrane, external ear and ear canal normal.  Nose: Nose normal. No  epistaxis. Right sinus exhibits no maxillary sinus tenderness and no frontal sinus tenderness. Left sinus exhibits no maxillary sinus tenderness and no frontal sinus tenderness.  Mouth/Throat: Uvula is midline, oropharynx is clear and moist and mucous membranes are normal. Mucous membranes are not pale and not cyanotic. No oropharyngeal exudate, posterior oropharyngeal edema, posterior oropharyngeal erythema or tonsillar abscesses.  Eyes: Conjunctivae and EOM are normal. Pupils are equal, round, and reactive to light. No scleral icterus.  No horizontal, vertical or rotational nystagmus  Neck: Normal range of motion and full passive range of motion without pain. Neck supple.  Full active and passive ROM without pain No midline or paraspinal tenderness No nuchal rigidity or meningeal signs  Cardiovascular: Normal rate, regular rhythm, normal heart sounds and intact distal pulses.   No murmur heard. Pulmonary/Chest: Effort normal and breath sounds normal. No stridor. No respiratory distress. She has no wheezes. She has no rales.  Clear and equal breath sounds without focal wheezes, rhonchi, rales  Abdominal: Soft. Bowel sounds are normal. She exhibits no distension. There is no tenderness. There is no rebound and no guarding.  Musculoskeletal: Normal range of motion.  Lymphadenopathy:    She has no cervical adenopathy.  Neurological: She is alert and oriented to person, place, and time. She has normal reflexes. No cranial nerve deficit. She exhibits normal muscle tone. Coordination normal.  Mental Status:  Alert, oriented, thought content appropriate. Speech fluent without evidence of aphasia. Able to follow 2 step commands without difficulty.  Cranial Nerves:  II:  Peripheral visual fields grossly normal, pupils equal, round, reactive to light III,IV, VI: ptosis not present, extra-ocular motions intact bilaterally  V,VII: smile symmetric, facial light touch sensation equal VIII: hearing grossly  normal bilaterally  IX,X: gag reflex present  XI: bilateral shoulder shrug equal and strong XII: midline tongue extension  Motor:  5/5 in upper and lower extremities bilaterally including strong and equal grip strength and dorsiflexion/plantar flexion Sensory: Pinprick and light touch normal in all extremities.  Deep Tendon Reflexes: 2+ and symmetric  Cerebellar: normal finger-to-nose with bilateral upper extremities Gait: normal gait and balance CV: distal pulses palpable throughout   Skin: Skin is warm and dry. No rash noted. She is not diaphoretic. No erythema.  Psychiatric: She has a normal mood and affect. Her behavior is normal. Judgment and thought content normal.  Nursing note and vitals reviewed.   ED Course  Procedures (including critical care time) Labs Review Labs Reviewed - No data to display  Imaging Review Ct Head Wo Contrast  03/07/2014   CLINICAL DATA:  Initial encounter for headache for 1 day.  EXAM: CT HEAD WITHOUT CONTRAST  TECHNIQUE: Contiguous axial images were obtained from the base of the skull through the vertex without intravenous contrast.  COMPARISON:  10/08/2008  FINDINGS: Sinuses/Soft tissues: Clear paranasal sinuses and mastoid air cells.  Intracranial: No mass lesion, hemorrhage, hydrocephalus, acute infarct, intra-axial, or extra-axial fluid collection.  IMPRESSION: Normal head CT.   Electronically Signed   By: Abigail Miyamoto M.D.   On: 03/07/2014 17:13     EKG Interpretation None      MDM   Final diagnoses:  Headache   Crystal Bullock presents with generalized throbbing headache.  Normal neurologic exam.  No clinical evidence of SAH, ICH, Meningitis, or temporal arteritis. Pt reports headache is different from previous, will obtain head CT.  Pt is afebrile with no focal neuro deficits, nuchal rigidity, or change in vision.   6:01 PM CT head without acute abnormality.  Pt remains neurologically intact.  Pt HA treated and improved while in ED.   Pt is to follow up with PCP in 2 days for re-evaluation.  No evidence of CVA.    I have personally reviewed patient's vitals, nursing note and any pertinent labs or imaging.  I performed an undressed physical exam.    It has been determined that no acute conditions requiring further emergency intervention are present at this time. The patient/guardian have been advised of the diagnosis and plan. I reviewed all labs and imaging including any potential incidental findings. We have discussed signs and symptoms that warrant return to the ED and they are listed in the discharge instructions.    Vital signs are stable at discharge.   BP 160/86 mmHg  Pulse 96  Temp(Src) 98 F (36.7 C) (Oral)  Resp 16  SpO2 97%  LMP 02/28/2014        Jarrett Soho Tremayne Sheldon, PA-C 03/08/14 [place], MD 03/08/14 1500

## 2014-03-07 NOTE — ED Notes (Signed)
Pt alert and oriented x4. Respirations even and unlabored, bilateral symmetrical rise and fall of chest. Skin warm and dry. In no acute distress. Denies needs.  PA at bedside

## 2014-03-07 NOTE — Discharge Instructions (Signed)
1. Medications: tylenol or ibuprofen for pain control, usual home medications 2. Treatment: rest, drink plenty of fluids,  3. Follow Up: Please followup with your primary doctor in 2 days for discussion of your diagnoses and further evaluation after today's visit; if you do not have a primary care doctor use the resource guide provided to find one; Please return to the ER for worsening headache, syncope, fevers or other concerns  General Headache Without Cause A headache is pain or discomfort felt around the head or neck area. The specific cause of a headache may not be found. There are many causes and types of headaches. A few common ones are:  Tension headaches.  Migraine headaches.  Cluster headaches.  Chronic daily headaches. HOME CARE INSTRUCTIONS   Keep all follow-up appointments with your caregiver or any specialist referral.  Only take over-the-counter or prescription medicines for pain or discomfort as directed by your caregiver.  Lie down in a dark, quiet room when you have a headache.  Keep a headache journal to find out what may trigger your migraine headaches. For example, write down:  What you eat and drink.  How much sleep you get.  Any change to your diet or medicines.  Try massage or other relaxation techniques.  Put ice packs or heat on the head and neck. Use these 3 to 4 times per day for 15 to 20 minutes each time, or as needed.  Limit stress.  Sit up straight, and do not tense your muscles.  Quit smoking if you smoke.  Limit alcohol use.  Decrease the amount of caffeine you drink, or stop drinking caffeine.  Eat and sleep on a regular schedule.  Get 7 to 9 hours of sleep, or as recommended by your caregiver.  Keep lights dim if bright lights bother you and make your headaches worse. SEEK MEDICAL CARE IF:   You have problems with the medicines you were prescribed.  Your medicines are not working.  You have a change from the usual  headache.  You have nausea or vomiting. SEEK IMMEDIATE MEDICAL CARE IF:   Your headache becomes severe.  You have a fever.  You have a stiff neck.  You have loss of vision.  You have muscular weakness or loss of muscle control.  You start losing your balance or have trouble walking.  You feel faint or pass out.  You have severe symptoms that are different from your first symptoms. MAKE SURE YOU:   Understand these instructions.  Will watch your condition.  Will get help right away if you are not doing well or get worse. Document Released: 01/28/2005 Document Revised: 04/22/2011 Document Reviewed: 02/13/2011 [facility] Patient Information 2015 [place], [place]. This information is not intended to replace advice given to you by your health care provider. Make sure you discuss any questions you have with your health care provider.

## 2014-03-07 NOTE — ED Notes (Signed)
Per ems pt c/o headache x1 day, pain has increased. Denies n/v/d. Denies LOC. Hx of HTN. Stroke scale negative.

## 2014-06-03 ENCOUNTER — Emergency Department (HOSPITAL_COMMUNITY)
Admission: EM | Admit: 2014-06-03 | Discharge: 2014-06-03 | Disposition: A | Discharge status: Home / Self Care | Payer: Medicare Other | Attending: Emergency Medicine | Admitting: Emergency Medicine

## 2014-06-03 ENCOUNTER — Encounter (HOSPITAL_COMMUNITY): Payer: Self-pay | Admitting: Emergency Medicine

## 2014-06-03 ENCOUNTER — Emergency Department (INDEPENDENT_AMBULATORY_CARE_PROVIDER_SITE_OTHER)
Admission: EM | Admit: 2014-06-03 | Discharge: 2014-06-03 | Disposition: A | Discharge status: Other Acute Inpatient Hospital | Payer: Medicare Other | Source: Home / Self Care | Attending: Emergency Medicine | Admitting: Emergency Medicine

## 2014-06-03 ENCOUNTER — Encounter (HOSPITAL_COMMUNITY): Payer: Self-pay

## 2014-06-03 DIAGNOSIS — Z87891 Personal history of nicotine dependence: Secondary | ICD-10-CM | POA: Insufficient documentation

## 2014-06-03 DIAGNOSIS — K2970 Gastritis, unspecified, without bleeding: Secondary | ICD-10-CM | POA: Diagnosis not present

## 2014-06-03 DIAGNOSIS — Z3202 Encounter for pregnancy test, result negative: Secondary | ICD-10-CM | POA: Diagnosis not present

## 2014-06-03 DIAGNOSIS — M545 Low back pain, unspecified: Secondary | ICD-10-CM

## 2014-06-03 DIAGNOSIS — Z792 Long term (current) use of antibiotics: Secondary | ICD-10-CM | POA: Diagnosis not present

## 2014-06-03 DIAGNOSIS — Z86018 Personal history of other benign neoplasm: Secondary | ICD-10-CM | POA: Insufficient documentation

## 2014-06-03 DIAGNOSIS — R1031 Right lower quadrant pain: Secondary | ICD-10-CM | POA: Diagnosis not present

## 2014-06-03 DIAGNOSIS — J45909 Unspecified asthma, uncomplicated: Secondary | ICD-10-CM | POA: Insufficient documentation

## 2014-06-03 DIAGNOSIS — R109 Unspecified abdominal pain: Secondary | ICD-10-CM | POA: Diagnosis not present

## 2014-06-03 DIAGNOSIS — Z79899 Other long term (current) drug therapy: Secondary | ICD-10-CM | POA: Insufficient documentation

## 2014-06-03 DIAGNOSIS — I10 Essential (primary) hypertension: Secondary | ICD-10-CM | POA: Insufficient documentation

## 2014-06-03 DIAGNOSIS — R10814 Left lower quadrant abdominal tenderness: Secondary | ICD-10-CM | POA: Diagnosis not present

## 2014-06-03 DIAGNOSIS — Z862 Personal history of diseases of the blood and blood-forming organs and certain disorders involving the immune mechanism: Secondary | ICD-10-CM | POA: Insufficient documentation

## 2014-06-03 DIAGNOSIS — R1032 Left lower quadrant pain: Secondary | ICD-10-CM | POA: Diagnosis not present

## 2014-06-03 DIAGNOSIS — R1030 Lower abdominal pain, unspecified: Secondary | ICD-10-CM | POA: Diagnosis present

## 2014-06-03 LAB — CBC WITH DIFFERENTIAL/PLATELET
Basophils Absolute: 0.1 10*3/uL (ref 0.0–0.1)
Basophils Relative: 1 % (ref 0–1)
Eosinophils Absolute: 0.3 10*3/uL (ref 0.0–0.7)
Eosinophils Relative: 4 % (ref 0–5)
HCT: 32 % — ABNORMAL LOW (ref 36.0–46.0)
Hemoglobin: 10.9 g/dL — ABNORMAL LOW (ref 12.0–15.0)
Lymphocytes Relative: 25 % (ref 12–46)
Lymphs Abs: 1.8 10*3/uL (ref 0.7–4.0)
MCH: 22.6 pg — ABNORMAL LOW (ref 26.0–34.0)
MCHC: 34.1 g/dL (ref 30.0–36.0)
MCV: 66.3 fL — ABNORMAL LOW (ref 78.0–100.0)
Monocytes Absolute: 0.4 10*3/uL (ref 0.1–1.0)
Monocytes Relative: 6 % (ref 3–12)
Neutro Abs: 4.6 10*3/uL (ref 1.7–7.7)
Neutrophils Relative %: 64 % (ref 43–77)
Platelets: 269 10*3/uL (ref 150–400)
RBC: 4.83 MIL/uL (ref 3.87–5.11)
RDW: 17.2 % — ABNORMAL HIGH (ref 11.5–15.5)
WBC: 7.2 10*3/uL (ref 4.0–10.5)

## 2014-06-03 LAB — COMPREHENSIVE METABOLIC PANEL
ALT: 23 U/L (ref 0–35)
AST: 25 U/L (ref 0–37)
Albumin: 3.2 g/dL — ABNORMAL LOW (ref 3.5–5.2)
Alkaline Phosphatase: 44 U/L (ref 39–117)
Anion gap: 9 (ref 5–15)
BUN: 6 mg/dL (ref 6–23)
CO2: 26 mmol/L (ref 19–32)
Calcium: 8.9 mg/dL (ref 8.4–10.5)
Chloride: 102 mmol/L (ref 96–112)
Creatinine, Ser: 0.91 mg/dL (ref 0.50–1.10)
GFR calc Af Amer: 86 mL/min — ABNORMAL LOW (ref >90)
GFR calc non Af Amer: 75 mL/min — ABNORMAL LOW (ref >90)
Glucose, Bld: 91 mg/dL (ref 70–99)
Potassium: 3.4 mmol/L — ABNORMAL LOW (ref 3.5–5.1)
Sodium: 137 mmol/L (ref 135–145)
Total Bilirubin: 0.4 mg/dL (ref 0.3–1.2)
Total Protein: 7 g/dL (ref 6.0–8.3)

## 2014-06-03 LAB — POCT URINALYSIS DIP (DEVICE)
Bilirubin Urine: NEGATIVE
Glucose, UA: NEGATIVE mg/dL
Hgb urine dipstick: NEGATIVE
Ketones, ur: NEGATIVE mg/dL
Leukocytes, UA: NEGATIVE
Nitrite: NEGATIVE
Protein, ur: NEGATIVE mg/dL
Specific Gravity, Urine: 1.02 (ref 1.005–1.030)
Urobilinogen, UA: 0.2 mg/dL (ref 0.0–1.0)
pH: 7 (ref 5.0–8.0)

## 2014-06-03 LAB — URINALYSIS, ROUTINE W REFLEX MICROSCOPIC
Bilirubin Urine: NEGATIVE
Glucose, UA: NEGATIVE mg/dL
Hgb urine dipstick: NEGATIVE
Ketones, ur: NEGATIVE mg/dL
Leukocytes, UA: NEGATIVE
Nitrite: NEGATIVE
Protein, ur: NEGATIVE mg/dL
Specific Gravity, Urine: 1.009 (ref 1.005–1.030)
Urobilinogen, UA: 0.2 mg/dL (ref 0.0–1.0)
pH: 7 (ref 5.0–8.0)

## 2014-06-03 LAB — I-STAT TROPONIN, ED: Troponin i, poc: 0 ng/mL (ref 0.00–0.08)

## 2014-06-03 LAB — POC URINE PREG, ED: Preg Test, Ur: NEGATIVE

## 2014-06-03 LAB — WET PREP, GENITAL
Trich, Wet Prep: NONE SEEN
Yeast Wet Prep HPF POC: NONE SEEN

## 2014-06-03 LAB — POC OCCULT BLOOD, ED: Fecal Occult Bld: NEGATIVE

## 2014-06-03 LAB — GC/CHLAMYDIA PROBE AMP (~~LOC~~) NOT AT ARMC
Chlamydia: NEGATIVE
Neisseria Gonorrhea: NEGATIVE

## 2014-06-03 MED ORDER — SODIUM CHLORIDE 0.9 % IV BOLUS (SEPSIS)
1000.0000 mL | Freq: Once | INTRAVENOUS | Status: AC
  Administered 2014-06-03: 1000 mL via INTRAVENOUS

## 2014-06-03 MED ORDER — SODIUM CHLORIDE 0.9 % IV SOLN
Freq: Once | INTRAVENOUS | Status: AC
  Administered 2014-06-03: 09:00:00 via INTRAVENOUS

## 2014-06-03 MED ORDER — IBUPROFEN 800 MG PO TABS: 800.0000 mg | ORAL_TABLET | Freq: Three times a day (TID) | ORAL | Status: DC | PRN

## 2014-06-03 MED ORDER — METHOCARBAMOL 500 MG PO TABS: 500.0000 mg | ORAL_TABLET | Freq: Two times a day (BID) | ORAL | Status: DC

## 2014-06-03 MED ORDER — MORPHINE SULFATE 4 MG/ML IJ SOLN
6.0000 mg | Freq: Once | INTRAMUSCULAR | Status: AC
  Administered 2014-06-03: 6 mg via INTRAVENOUS
  Filled 2014-06-03: qty 2

## 2014-06-03 MED ORDER — MORPHINE SULFATE 2 MG/ML IJ SOLN
INTRAMUSCULAR | Status: AC
  Filled 2014-06-03: qty 1

## 2014-06-03 MED ORDER — MORPHINE SULFATE 2 MG/ML IJ SOLN
2.0000 mg | Freq: Once | INTRAMUSCULAR | Status: AC
  Administered 2014-06-03: 2 mg via INTRAVENOUS

## 2014-06-03 NOTE — ED Notes (Signed)
Per EMS- pt seen at [facility] for back pain x4 days and new lower abd pain. 2 mg of morphine given at [facility] got pain from 10 to an 8/10. Pt has 20 in RFA. A/O x4. Pt hypertensive per EMS 210/109. Hx of HTN, did not take meds this am. 100% on RA

## 2014-06-03 NOTE — ED Notes (Signed)
C/o abdomnial pain, back pain x past 3 days. Pain better when rests, hols breath, no movement. Pain in abdomin mostly left sided . C/o a lot of gas, bette w gas pill

## 2014-06-03 NOTE — ED Provider Notes (Signed)
CSN: [id number]     Arrival date & time 06/03/14  0800 History   First MD Initiated Contact with Patient 06/03/14 [phone]     Chief Complaint  Patient presents with  . Bloated  . Back Pain   (Consider location/radiation/quality/duration/timing/severity/associated sxs/prior Treatment) HPI  She is a 49 year old woman here for evaluation of back and abdominal pain. The back pain has been a long-standing chronic issue. She states the current episode started about 3 days ago across her lower back. She denies any radiating pain. No numbness, tingling, weakness in her lower extremities. No bowel or bladder incontinence. She has tried Percocet at home without improvement. More concerning, she developed diffuse abdominal pain yesterday. This is associated with a bloated feeling. This started yesterday after doing an ab workout. She states her stomach feels sore, like after vomiting a lot. She reports her stomach was distended yesterday. She took a gas pill last night, which did help. She had a normal bowel movement this morning. She denies any nausea or vomiting. No fevers. No urinary symptoms. She did have some vaginal discharge and odor several days ago, but states this is currently resolved.  Past Medical History  Diagnosis Date  . Hypertension   . Sarcoidosis   . Fibroid   . Asthma   . Scoliosis   . Sickle cell trait   . Seasonal allergies    Past Surgical History  Procedure Laterality Date  . Axillary lymph node dissection    . Cholecystectomy     History reviewed. No pertinent family history. History  Substance Use Topics  . Smoking status: Former Smoker    Types: Cigarettes  . Smokeless tobacco: Not on file  . Alcohol Use: Yes     Comment: occasional    OB History    No data available     Review of Systems  Constitutional: Positive for appetite change. Negative for fever and chills.  HENT: Negative.   Respiratory: Negative.   Cardiovascular: Negative.   Gastrointestinal:  Positive for abdominal pain and abdominal distention. Negative for nausea, vomiting, diarrhea, constipation and blood in stool.  Musculoskeletal: Positive for back pain.    Allergies  Ivp dye  Home Medications   Prior to Admission medications   Medication Sig Start Date End Date Taking? Authorizing Provider  albuterol (PROVENTIL HFA;VENTOLIN HFA) 108 (90 BASE) MCG/ACT inhaler Inhale 2 puffs into the lungs every 6 (six) hours as needed for wheezing (wheezing).    Yes Historical Provider, MD  albuterol (PROVENTIL) (2.5 MG/3ML) 0.083% nebulizer solution Take 2.5 mg by nebulization every 6 (six) hours as needed for wheezing (wheezing).    Yes Historical Provider, MD  amLODipine (NORVASC) 10 MG tablet Take 10 mg by mouth daily.   Yes Historical Provider, MD  Ascorbic Acid (VITAMIN C) 1000 MG tablet Take 1,000 mg by mouth daily.   Yes Historical Provider, MD  aspirin-sod bicarb-citric acid (ALKA-SELTZER) 325 MG TBEF tablet Take 325 mg by mouth every 6 (six) hours as needed (for congestion).   Yes Historical Provider, MD  cetirizine (ZYRTEC) 10 MG tablet Take 10 mg by mouth daily as needed for allergies (allergies).    Yes Historical Provider, MD  doxycycline (VIBRA-TABS) 100 MG tablet Take 100 mg by mouth 2 (two) times daily as needed (flare ups). For flare ups   Yes Historical Provider, MD  oxyCODONE-acetaminophen (PERCOCET) 10-325 MG per tablet Take 1 tablet by mouth every 4 (four) hours as needed for pain. 01/12/13  Yes Antonietta Breach, PA-C  amoxicillin-clavulanate (AUGMENTIN) 875-125 MG per tablet Take 1 tablet by mouth 2 (two) times daily.    Historical Provider, MD  cyclobenzaprine (FLEXERIL) 5 MG tablet Take 1 tablet (5 mg total) by mouth 3 (three) times daily as needed for muscle spasms. 01/28/14   Carvel Getting, NP  esomeprazole (NEXIUM) 40 MG capsule Take 40 mg by mouth daily before breakfast.    Historical Provider, MD  HYDROcodone-acetaminophen (NORCO/VICODIN) 5-325 MG per tablet Take 1  tablet by mouth every 6 (six) hours as needed for moderate pain. 08/28/13   Dalia Heading, PA-C  ibuprofen (ADVIL,MOTRIN) 200 MG tablet Take 3 tablets (600 mg total) by mouth daily as needed (for swelling). 01/12/13   Antonietta Breach, PA-C  ibuprofen (ADVIL,MOTRIN) 800 MG tablet Take 1 tablet (800 mg total) by mouth every 8 (eight) hours as needed. 08/28/13   Christopher Lawyer, PA-C  LORazepam (ATIVAN) 1 MG tablet Take 1 mg by mouth daily.     Historical Provider, MD  Multiple Vitamins-Minerals (MULTIVITAMINS THER. W/MINERALS) TABS Take 1 tablet by mouth daily.    Historical Provider, MD  potassium chloride (K-DUR,KLOR-CON) 10 MEQ tablet Take 10 mEq by mouth daily.    Historical Provider, MD  promethazine (PHENERGAN) 25 MG tablet Take 25 mg by mouth every 6 (six) hours as needed for nausea or vomiting (nausea & vomiting).     Historical Provider, MD   BP 180/109 mmHg  Pulse 68  Temp(Src) 98.9 F (37.2 C) (Oral)  Resp 20  SpO2 97% Physical Exam  Constitutional: She is oriented to person, place, and time. She appears well-developed and well-nourished. She appears distressed (laying on bed, looking quite uncomfortable.).  HENT:  Head: Normocephalic and atraumatic.  Neck: Neck supple.  Cardiovascular: Normal rate.   Pulmonary/Chest: Effort normal.  Abdominal: Soft. Bowel sounds are normal. She exhibits no distension and no mass. There is tenderness (Diffuse, but worse in left lower quadrant and suprapubic area). There is no rebound and no guarding.  Musculoskeletal:  Back: Exam limited due to her discomfort. She is diffusely tender across her lower back. No erythema or edema.  Neurological: She is alert and oriented to person, place, and time.  Skin: Skin is warm and dry.    ED Course  Procedures (including critical care time) Labs Review Labs Reviewed  POCT URINALYSIS DIP (DEVICE)    Imaging Review No results found.   MDM   1. Abdominal pain, unspecified abdominal location   2.  Bilateral low back pain without sciatica    Urinalysis is completely normal.  I suspect this is muscular pain. She has a benign abdominal exam, but is in enough discomfort to raise a question for intra-abdominal pathology. Peripheral IV started and morphine 2 mg IV given. We'll transfer to [facility] ER via EMS for additional workup and evaluation.    Melony Overly, MD 06/03/14 [phone]

## 2014-06-03 NOTE — Discharge Instructions (Signed)
Your abdominal wall pain is likely due to muscle pain after working out.  Please take ibuprofen as needed for pain.  Continue with your exercise regimen.  Return to ER if you develop fever, persistent nausea and vomiting, difficulty urinating or if you have any other concerns.   Muscle Pain Muscle pain (myalgia) may be caused by many things, including:  Overuse or muscle strain, especially if you are not in shape. This is the most common cause of muscle pain.  Injury.  Bruises.  Viruses, such as the flu.  Infectious diseases.  Fibromyalgia, which is a chronic condition that causes muscle tenderness, fatigue, and headache.  Autoimmune diseases, including lupus.  Certain drugs, including ACE inhibitors and statins. Muscle pain may be mild or severe. In most cases, the pain lasts only a short time and goes away without treatment. To diagnose the cause of your muscle pain, your health care provider will take your medical history. This means he or she will ask you when your muscle pain began and what has been happening. If you have not had muscle pain for very long, your health care provider may want to wait before doing much testing. If your muscle pain has lasted a long time, your health care provider may want to run tests right away. If your health care provider thinks your muscle pain may be caused by illness, you may need to have additional tests to rule out certain conditions.  Treatment for muscle pain depends on the cause. Home care is often enough to relieve muscle pain. Your health care provider may also prescribe anti-inflammatory medicine. HOME CARE INSTRUCTIONS Watch your condition for any changes. The following actions may help to lessen any discomfort you are feeling:  Only take over-the-counter or prescription medicines as directed by your health care provider.  Apply ice to the sore muscle:  Put ice in a plastic bag.  Place a towel between your skin and the bag.  Leave  the ice on for 15-20 minutes, 3-4 times a day.  You may alternate applying hot and cold packs to the muscle as directed by your health care provider.  If overuse is causing your muscle pain, slow down your activities until the pain goes away.  Remember that it is normal to feel some muscle pain after starting a workout program. Muscles that have not been used often will be sore at first.  Do regular, gentle exercises if you are not usually active.  Warm up before exercising to lower your risk of muscle pain.  Do not continue working out if the pain is very bad. Bad pain could mean you have injured a muscle. SEEK MEDICAL CARE IF:  Your muscle pain gets worse, and medicines do not help.  You have muscle pain that lasts longer than 3 days.  You have a rash or fever along with muscle pain.  You have muscle pain after a tick bite.  You have muscle pain while working out, even though you are in good physical condition.  You have redness, soreness, or swelling along with muscle pain.  You have muscle pain after starting a new medicine or changing the dose of a medicine. SEEK IMMEDIATE MEDICAL CARE IF:  You have trouble breathing.  You have trouble swallowing.  You have muscle pain along with a stiff neck, fever, and vomiting.  You have severe muscle weakness or cannot move part of your body. MAKE SURE YOU:   Understand these instructions.  Will watch your condition.  Will get help right away if you are not doing well or get worse. Document Released: 12/20/2005 Document Revised: 02/02/2013 Document Reviewed: 11/24/2012 [facility] Patient Information 2015 [place], [place]. This information is not intended to replace advice given to you by your health care provider. Make sure you discuss any questions you have with your health care provider.

## 2014-06-03 NOTE — ED Provider Notes (Signed)
CSN: [id number]     Arrival date & time 06/03/14  6387 History   First MD Initiated Contact with Patient 06/03/14 0945     Chief Complaint  Patient presents with  . Abdominal Pain     (Consider location/radiation/quality/duration/timing/severity/associated sxs/prior Treatment) HPI   49 year old female with history of sarcoidosis, fibroid, sickle cell trait, and hypertension who was brought via EMS from urgent care Center for evaluation of abdominal pain. Patient reports she recently started a new workout routine 4 days ago. She has been doing a lot of crunches, and sit up. Since yesterday she has been experiencing pain to her lower abdomen which she described as a stabbing sensation with dull achy pain, waxing waning, usually lasting for minutes. Pain is worsening with movement. Pain is improved with resting. She also has history of chronic back pain which take Percocet. She does endorse increase of chronic back pain, nonradiating, with no associated bowel pattern consciousness or saddle anesthesia. She denies any fever, chills, nausea vomiting diarrhea, chest pain, shortness of breath, productive cough, dysuria, hematuria, vaginal bleeding, vaginal discharge, or rash. She does report occasional discomfort with sexual activities and think it may be related to her fibroid. She denies any prior history of STD. She does report having a bowel movement this morning and having anal spasm which is new. Denies hematochezia or melena. She was initially seen at urgent care for a complaint but was sent here for further evaluation.  Past Medical History  Diagnosis Date  . Hypertension   . Sarcoidosis   . Fibroid   . Asthma   . Scoliosis   . Sickle cell trait   . Seasonal allergies    Past Surgical History  Procedure Laterality Date  . Axillary lymph node dissection    . Cholecystectomy     No family history on file. History  Substance Use Topics  . Smoking status: Former Smoker    Types:  Cigarettes  . Smokeless tobacco: Not on file  . Alcohol Use: Yes     Comment: occasional    OB History    No data available     Review of Systems  All other systems reviewed and are negative.     Allergies  Ivp dye  Home Medications   Prior to Admission medications   Medication Sig Start Date End Date Taking? Authorizing Provider  albuterol (PROVENTIL HFA;VENTOLIN HFA) 108 (90 BASE) MCG/ACT inhaler Inhale 2 puffs into the lungs every 6 (six) hours as needed for wheezing (wheezing).     Historical Provider, MD  albuterol (PROVENTIL) (2.5 MG/3ML) 0.083% nebulizer solution Take 2.5 mg by nebulization every 6 (six) hours as needed for wheezing (wheezing).     Historical Provider, MD  amLODipine (NORVASC) 10 MG tablet Take 10 mg by mouth daily.    Historical Provider, MD  amoxicillin-clavulanate (AUGMENTIN) 875-125 MG per tablet Take 1 tablet by mouth 2 (two) times daily.    Historical Provider, MD  Ascorbic Acid (VITAMIN C) 1000 MG tablet Take 1,000 mg by mouth daily.    Historical Provider, MD  aspirin-sod bicarb-citric acid (ALKA-SELTZER) 325 MG TBEF tablet Take 325 mg by mouth every 6 (six) hours as needed (for congestion).    Historical Provider, MD  cetirizine (ZYRTEC) 10 MG tablet Take 10 mg by mouth daily as needed for allergies (allergies).     Historical Provider, MD  cyclobenzaprine (FLEXERIL) 5 MG tablet Take 1 tablet (5 mg total) by mouth 3 (three) times daily as needed for muscle  spasms. 01/28/14   Carvel Getting, NP  doxycycline (VIBRA-TABS) 100 MG tablet Take 100 mg by mouth 2 (two) times daily as needed (flare ups). For flare ups    Historical Provider, MD  esomeprazole (NEXIUM) 40 MG capsule Take 40 mg by mouth daily before breakfast.    Historical Provider, MD  HYDROcodone-acetaminophen (NORCO/VICODIN) 5-325 MG per tablet Take 1 tablet by mouth every 6 (six) hours as needed for moderate pain. 08/28/13   Dalia Heading, PA-C  ibuprofen (ADVIL,MOTRIN) 200 MG tablet  Take 3 tablets (600 mg total) by mouth daily as needed (for swelling). 01/12/13   Antonietta Breach, PA-C  ibuprofen (ADVIL,MOTRIN) 800 MG tablet Take 1 tablet (800 mg total) by mouth every 8 (eight) hours as needed. 08/28/13   Christopher Lawyer, PA-C  LORazepam (ATIVAN) 1 MG tablet Take 1 mg by mouth daily.     Historical Provider, MD  Multiple Vitamins-Minerals (MULTIVITAMINS THER. W/MINERALS) TABS Take 1 tablet by mouth daily.    Historical Provider, MD  oxyCODONE-acetaminophen (PERCOCET) 10-325 MG per tablet Take 1 tablet by mouth every 4 (four) hours as needed for pain. 01/12/13   Antonietta Breach, PA-C  potassium chloride (K-DUR,KLOR-CON) 10 MEQ tablet Take 10 mEq by mouth daily.    Historical Provider, MD  promethazine (PHENERGAN) 25 MG tablet Take 25 mg by mouth every 6 (six) hours as needed for nausea or vomiting (nausea & vomiting).     Historical Provider, MD   There were no vitals taken for this visit. Physical Exam  Constitutional: She appears well-developed and well-nourished. No distress.  HENT:  Head: Atraumatic.  Mouth/Throat: Oropharynx is clear and moist.  Eyes: Conjunctivae are normal.  Neck: Neck supple.  Cardiovascular: Normal rate and regular rhythm.   Pulmonary/Chest: Effort normal and breath sounds normal.  Abdominal: Soft. Bowel sounds are normal. She exhibits no distension. There is tenderness (diffuse abdominal tenderness without guarding or rebound tenderness. No overlying skin changes.). There is no rebound and no guarding.  Genitourinary:  Chaperone present during exam. Normal external genitalia. Old scars noted to bilateral medial thigh without signs of infection. On speculum insertion, no discomfort. Yellowish discharge with trace of blood noted in vaginal vault and from cervical os. Os is closed. Bimanual examination is limited due to large body habitus however no obvious adnexal tenderness or cervical motion tenderness.  Musculoskeletal: She exhibits tenderness (Lumbar  paralumbar spinal muscle tenderness on palpation without crepitus or step-off. No overlying skin changes.).  Neurological: She is alert.  Skin: No rash noted.  Psychiatric: She has a normal mood and affect.  Nursing note and vitals reviewed.   ED Course  Procedures (including critical care time)  Patient with low abdominal pain, reproducible on exam. Pain is diffuse and tender even on light palpation. I suspect this is abdominal wall tenderness likely secondary to recent exercise. She denies any dysuria. She has no red flags.  12:08 PM Patient had no discomfort on pelvic examination concerning for PID.  2:12 PM The patient does have moderate WBC on wet prep and a few clue cells, she has no significant pelvic discomfort and denies any prior history of STDs therefore cultures sent and patient will be notified for treatment if she is tested positive. Her pregnancy test is negative, urine shows no signs of urinary tract infection, labs are reassuring. I have low suspicion for acute emergent pathology. I suspect her pain is likely abdominal wall pain from recent exercise. Her pain is well controlled with 4 mg of morphine  given in the ED. I encouraged patient to continue with her exercise regimen. I recommend ibuprofen as needed for pain. Return precautions discussed. Patient stable for discharge.    Labs Review Labs Reviewed  WET PREP, GENITAL - Abnormal; Notable for the following:    Clue Cells Wet Prep HPF POC FEW (*)    WBC, Wet Prep HPF POC MODERATE (*)    All other components within normal limits  CBC WITH DIFFERENTIAL/PLATELET - Abnormal; Notable for the following:    Hemoglobin 10.9 (*)    HCT 32.0 (*)    MCV 66.3 (*)    MCH 22.6 (*)    RDW 17.2 (*)    All other components within normal limits  COMPREHENSIVE METABOLIC PANEL - Abnormal; Notable for the following:    Potassium 3.4 (*)    Albumin 3.2 (*)    GFR calc non Af Amer 75 (*)    GFR calc Af Amer 86 (*)    All other  components within normal limits  URINALYSIS, ROUTINE W REFLEX MICROSCOPIC  RPR  HIV ANTIBODY (ROUTINE TESTING)  I-STAT TROPOININ, ED  POC URINE PREG, ED  POC OCCULT BLOOD, ED  GC/CHLAMYDIA PROBE AMP ([place])    Imaging Review No results found.   EKG Interpretation None      MDM   Final diagnoses:  Abdominal wall pain in both lower quadrants   BP 165/79 mmHg  Pulse 70  Temp(Src) 98.5 F (36.9 C) (Oral)  Resp 22  SpO2 97%  I have reviewed nursing notes and vital signs. I reviewed available ER/hospitalization records thought the EMR     Domenic Moras, PA-C 06/03/14 [place], MD 06/03/14 [phone]

## 2014-06-04 LAB — HIV ANTIBODY (ROUTINE TESTING W REFLEX): HIV Screen 4th Generation wRfx: NONREACTIVE

## 2014-06-04 LAB — RPR: RPR Ser Ql: NONREACTIVE

## 2014-06-08 ENCOUNTER — Encounter (HOSPITAL_COMMUNITY): Payer: Self-pay

## 2014-06-08 ENCOUNTER — Emergency Department (INDEPENDENT_AMBULATORY_CARE_PROVIDER_SITE_OTHER)
Admission: EM | Admit: 2014-06-08 | Discharge: 2014-06-08 | Disposition: A | Discharge status: Home / Self Care | Payer: Medicare Other | Source: Home / Self Care | Attending: Family Medicine | Admitting: Family Medicine

## 2014-06-08 DIAGNOSIS — M545 Low back pain, unspecified: Secondary | ICD-10-CM

## 2014-06-08 MED ORDER — METHYLPREDNISOLONE SODIUM SUCC 125 MG IJ SOLR
INTRAMUSCULAR | Status: AC
  Filled 2014-06-08: qty 2

## 2014-06-08 MED ORDER — METHYLPREDNISOLONE SODIUM SUCC 125 MG IJ SOLR
125.0000 mg | Freq: Once | INTRAMUSCULAR | Status: AC
  Administered 2014-06-08: 125 mg via INTRAMUSCULAR

## 2014-06-08 NOTE — ED Provider Notes (Signed)
CSN: [id number]     Arrival date & time 06/08/14  0804 History   First MD Initiated Contact with Patient 06/08/14 0827     Chief Complaint  Patient presents with  . Back Pain   (Consider location/radiation/quality/duration/timing/severity/associated sxs/prior Treatment) Patient is a 49 y.o. female presenting with back pain. The history is provided by the patient. No language interpreter was used.  Back Pain Location:  Generalized Quality:  Aching Radiates to:  Does not radiate Pain severity:  Severe Pain is:  Worse during the day Onset quality:  Gradual Timing:  Constant Progression:  Worsening Chronicity:  New Context: emotional stress   Relieved by:  Nothing Worsened by:  Nothing tried Ineffective treatments:  None tried Associated symptoms: no fever   Risk factors: no recent surgery   Pt has a family funeral to attend and back pain is flaring up.  Pt request a shot of prednisone  Past Medical History  Diagnosis Date  . Hypertension   . Sarcoidosis   . Fibroid   . Asthma   . Scoliosis   . Sickle cell trait   . Seasonal allergies    Past Surgical History  Procedure Laterality Date  . Axillary lymph node dissection    . Cholecystectomy     History reviewed. No pertinent family history. History  Substance Use Topics  . Smoking status: Former Smoker    Types: Cigarettes  . Smokeless tobacco: Not on file  . Alcohol Use: Yes     Comment: occasional    OB History    No data available     Review of Systems  Constitutional: Negative for fever.  Musculoskeletal: Positive for back pain.  All other systems reviewed and are negative.   Allergies  Ivp dye  Home Medications   Prior to Admission medications   Medication Sig Start Date End Date Taking? Authorizing Provider  albuterol (PROVENTIL HFA;VENTOLIN HFA) 108 (90 BASE) MCG/ACT inhaler Inhale 2 puffs into the lungs every 6 (six) hours as needed for wheezing (wheezing).     Historical Provider, MD  albuterol  (PROVENTIL) (2.5 MG/3ML) 0.083% nebulizer solution Take 2.5 mg by nebulization every 6 (six) hours as needed for wheezing (wheezing).     Historical Provider, MD  amLODipine (NORVASC) 10 MG tablet Take 10 mg by mouth daily.    Historical Provider, MD  Ascorbic Acid (VITAMIN C) 1000 MG tablet Take 1,000 mg by mouth daily.    Historical Provider, MD  aspirin-sod bicarb-citric acid (ALKA-SELTZER) 325 MG TBEF tablet Take 325 mg by mouth every 6 (six) hours as needed (for congestion).    Historical Provider, MD  cetirizine (ZYRTEC) 10 MG tablet Take 10 mg by mouth daily as needed for allergies (allergies).     Historical Provider, MD  cyclobenzaprine (FLEXERIL) 5 MG tablet Take 1 tablet (5 mg total) by mouth 3 (three) times daily as needed for muscle spasms. 01/28/14   Carvel Getting, NP  doxycycline (VIBRA-TABS) 100 MG tablet Take 100 mg by mouth 2 (two) times daily as needed (flare ups). For flare ups    Historical Provider, MD  esomeprazole (NEXIUM) 40 MG capsule Take 40 mg by mouth daily before breakfast.    Historical Provider, MD  fluticasone (FLONASE) 50 MCG/ACT nasal spray Place 2 sprays into both nostrils daily as needed. allergies 05/20/14   Historical Provider, MD  HYDROcodone-acetaminophen (NORCO/VICODIN) 5-325 MG per tablet Take 1 tablet by mouth every 6 (six) hours as needed for moderate pain. 08/28/13   Harrell Gave  Lawyer, PA-C  ibuprofen (ADVIL,MOTRIN) 800 MG tablet Take 1 tablet (800 mg total) by mouth every 8 (eight) hours as needed for mild pain or moderate pain. 06/03/14   Domenic Moras, PA-C  LORazepam (ATIVAN) 1 MG tablet Take 1 mg by mouth daily as needed for anxiety.     Historical Provider, MD  methocarbamol (ROBAXIN) 500 MG tablet Take 1 tablet (500 mg total) by mouth 2 (two) times daily. 06/03/14   Domenic Moras, PA-C  Multiple Vitamins-Minerals (MULTIVITAMINS THER. W/MINERALS) TABS Take 1 tablet by mouth daily.    Historical Provider, MD  oxyCODONE-acetaminophen (PERCOCET) 10-325 MG per  tablet Take 1 tablet by mouth every 4 (four) hours as needed for pain. 01/12/13   Antonietta Breach, PA-C  potassium chloride (K-DUR,KLOR-CON) 10 MEQ tablet Take 10 mEq by mouth daily.    Historical Provider, MD  promethazine (PHENERGAN) 25 MG tablet Take 25 mg by mouth every 6 (six) hours as needed for nausea or vomiting (nausea & vomiting).     Historical Provider, MD   BP 154/88 mmHg  Pulse 70  Temp(Src) 98.6 F (37 C) (Oral)  Resp 16  SpO2 100% Physical Exam  Constitutional: She is oriented to person, place, and time. She appears well-developed and well-nourished.  HENT:  Head: Normocephalic.  Eyes: EOM are normal.  Neck: Normal range of motion.  Pulmonary/Chest: Effort normal.  Abdominal: She exhibits no distension.  Musculoskeletal: Normal range of motion.  Tender lumbar diffusely nv anc ns intact  Neurological: She is alert and oriented to person, place, and time.  Psychiatric: She has a normal mood and affect.  Nursing note and vitals reviewed.   ED Course  Procedures (including critical care time) Labs Review Labs Reviewed - No data to display  Imaging Review No results found.   MDM   1. Bilateral low back pain without sciatica    Solumedrol Continue current medications    Fransico Meadow, PA-C 06/08/14 6237

## 2014-06-08 NOTE — Discharge Instructions (Signed)
Back Pain, Adult Low back pain is very common. About 1 in 5 people have back pain.The cause of low back pain is rarely dangerous. The pain often gets better over time.About half of people with a sudden onset of back pain feel better in just 2 weeks. About 8 in 10 people feel better by 6 weeks.  CAUSES Some common causes of back pain include:  Strain of the muscles or ligaments supporting the spine.  Wear and tear (degeneration) of the spinal discs.  Arthritis.  Direct injury to the back. DIAGNOSIS Most of the time, the direct cause of low back pain is not known.However, back pain can be treated effectively even when the exact cause of the pain is unknown.Answering your caregiver's questions about your overall health and symptoms is one of the most accurate ways to make sure the cause of your pain is not dangerous. If your caregiver needs more information, he or she may order lab work or imaging tests (X-rays or MRIs).However, even if imaging tests show changes in your back, this usually does not require surgery. HOME CARE INSTRUCTIONS For many people, back pain returns.Since low back pain is rarely dangerous, it is often a condition that people can learn to manageon their own.   Remain active. It is stressful on the back to sit or stand in one place. Do not sit, drive, or stand in one place for more than 30 minutes at a time. Take short walks on level surfaces as soon as pain allows.Try to increase the length of time you walk each day.  Do not stay in bed.Resting more than 1 or 2 days can delay your recovery.  Do not avoid exercise or work.Your body is made to move.It is not dangerous to be active, even though your back may hurt.Your back will likely heal faster if you return to being active before your pain is gone.  Pay attention to your body when you bend and lift. Many people have less discomfortwhen lifting if they bend their knees, keep the load close to their bodies,and  avoid twisting. Often, the most comfortable positions are those that put less stress on your recovering back.  Find a comfortable position to sleep. Use a firm mattress and lie on your side with your knees slightly bent. If you lie on your back, put a pillow under your knees.  Only take over-the-counter or prescription medicines as directed by your caregiver. Over-the-counter medicines to reduce pain and inflammation are often the most helpful.Your caregiver may prescribe muscle relaxant drugs.These medicines help dull your pain so you can more quickly return to your normal activities and healthy exercise.  Put ice on the injured area.  Put ice in a plastic bag.  Place a towel between your skin and the bag.  Leave the ice on for 15-20 minutes, 03-04 times a day for the first 2 to 3 days. After that, ice and heat may be alternated to reduce pain and spasms.  Ask your caregiver about trying back exercises and gentle massage. This may be of some benefit.  Avoid feeling anxious or stressed.Stress increases muscle tension and can worsen back pain.It is important to recognize when you are anxious or stressed and learn ways to manage it.Exercise is a great option. SEEK MEDICAL CARE IF:  You have pain that is not relieved with rest or medicine.  You have pain that does not improve in 1 week.  You have new symptoms.  You are generally not feeling well. SEEK   IMMEDIATE MEDICAL CARE IF:   You have pain that radiates from your back into your legs.  You develop new bowel or bladder control problems.  You have unusual weakness or numbness in your arms or legs.  You develop nausea or vomiting.  You develop abdominal pain.  You feel faint. Document Released: 01/28/2005 Document Revised: 07/30/2011 Document Reviewed: 06/01/2013 [employer] Patient Information 2015 [employer]. This information is not intended to replace advice given to you by your health care provider. Make sure you  discuss any questions you have with your health care provider.  

## 2014-06-08 NOTE — ED Notes (Addendum)
C/o pain in lower back . Seen [facility] and ED for pain on 4-22. C/o continued pain , not relieved w Rx from ED. During d/c patient discussed how she worked in yard, using [employer]

## 2014-09-12 ENCOUNTER — Emergency Department (INDEPENDENT_AMBULATORY_CARE_PROVIDER_SITE_OTHER)
Admission: EM | Admit: 2014-09-12 | Discharge: 2014-09-12 | Disposition: A | Discharge status: Home / Self Care | Payer: Medicare Other | Source: Home / Self Care | Attending: Family Medicine | Admitting: Family Medicine

## 2014-09-12 ENCOUNTER — Encounter (HOSPITAL_COMMUNITY): Payer: Self-pay | Admitting: *Deleted

## 2014-09-12 DIAGNOSIS — M545 Low back pain: Secondary | ICD-10-CM | POA: Diagnosis not present

## 2014-09-12 MED ORDER — IBUPROFEN 800 MG PO TABS: 800.0000 mg | ORAL_TABLET | Freq: Three times a day (TID) | ORAL | Status: DC

## 2014-09-12 MED ORDER — METAXALONE 800 MG PO TABS: 800.0000 mg | ORAL_TABLET | Freq: Three times a day (TID) | ORAL | Status: DC

## 2014-09-12 MED ORDER — KETOROLAC TROMETHAMINE 60 MG/2ML IM SOLN
60.0000 mg | Freq: Once | INTRAMUSCULAR | Status: AC
  Administered 2014-09-12: 60 mg via INTRAMUSCULAR

## 2014-09-12 MED ORDER — KETOROLAC TROMETHAMINE 60 MG/2ML IM SOLN
INTRAMUSCULAR | Status: AC
  Filled 2014-09-12: qty 2

## 2014-09-12 NOTE — ED Notes (Signed)
Pt      Reports     Symptoms     Of  Back  Pain       Described  bed       As  Muscle  Spasms   X  sev  Weeks      Has  Had  Back  Problems  In  Past          pt   Reports     Usually  Has  To  Get steriod  Shots  But it has  Been a  While  Since  She has  Gotten       One

## 2014-09-12 NOTE — ED Provider Notes (Signed)
CSN: [id number]     Arrival date & time 09/12/14  1905 History   First MD Initiated Contact with Patient 09/12/14 2050     Chief Complaint  Patient presents with  . Back Pain   (Consider location/radiation/quality/duration/timing/severity/associated sxs/prior Treatment) Patient is a 49 y.o. female presenting with back pain. The history is provided by the patient.  Back Pain Location:  Lumbar spine Quality:  Stabbing and cramping Radiates to:  Does not radiate Pain severity:  Moderate Onset quality:  Gradual Duration:  3 weeks Progression:  Worsening Chronicity:  Chronic Context comment:  Sees dr Noah Delaine for back and gives steroid shot Relieved by:  None tried Worsened by:  Nothing tried Ineffective treatments:  None tried Associated symptoms: no abdominal pain, no bladder incontinence, no bowel incontinence, no dysuria, no fever and no leg pain   Risk factors: obesity     Past Medical History  Diagnosis Date  . Hypertension   . Sarcoidosis   . Fibroid   . Asthma   . Scoliosis   . Sickle cell trait   . Seasonal allergies    Past Surgical History  Procedure Laterality Date  . Axillary lymph node dissection    . Cholecystectomy     History reviewed. No pertinent family history. History  Substance Use Topics  . Smoking status: Former Smoker    Types: Cigarettes  . Smokeless tobacco: Not on file  . Alcohol Use: Yes     Comment: occasional    OB History    No data available     Review of Systems  Constitutional: Negative.  Negative for fever.  Gastrointestinal: Negative.  Negative for abdominal pain and bowel incontinence.  Genitourinary: Negative.  Negative for bladder incontinence and dysuria.  Musculoskeletal: Positive for back pain. Negative for myalgias and gait problem.  Skin: Negative.     Allergies  Ivp dye  Home Medications   Prior to Admission medications   Medication Sig Start Date End Date Taking? Authorizing Provider  albuterol (PROVENTIL  HFA;VENTOLIN HFA) 108 (90 BASE) MCG/ACT inhaler Inhale 2 puffs into the lungs every 6 (six) hours as needed for wheezing (wheezing).     Historical Provider, MD  albuterol (PROVENTIL) (2.5 MG/3ML) 0.083% nebulizer solution Take 2.5 mg by nebulization every 6 (six) hours as needed for wheezing (wheezing).     Historical Provider, MD  amLODipine (NORVASC) 10 MG tablet Take 10 mg by mouth daily.    Historical Provider, MD  Ascorbic Acid (VITAMIN C) 1000 MG tablet Take 1,000 mg by mouth daily.    Historical Provider, MD  aspirin-sod bicarb-citric acid (ALKA-SELTZER) 325 MG TBEF tablet Take 325 mg by mouth every 6 (six) hours as needed (for congestion).    Historical Provider, MD  cetirizine (ZYRTEC) 10 MG tablet Take 10 mg by mouth daily as needed for allergies (allergies).     Historical Provider, MD  cyclobenzaprine (FLEXERIL) 5 MG tablet Take 1 tablet (5 mg total) by mouth 3 (three) times daily as needed for muscle spasms. 01/28/14   Carvel Getting, NP  doxycycline (VIBRA-TABS) 100 MG tablet Take 100 mg by mouth 2 (two) times daily as needed (flare ups). For flare ups    Historical Provider, MD  esomeprazole (NEXIUM) 40 MG capsule Take 40 mg by mouth daily before breakfast.    Historical Provider, MD  fluticasone (FLONASE) 50 MCG/ACT nasal spray Place 2 sprays into both nostrils daily as needed. allergies 05/20/14   Historical Provider, MD  HYDROcodone-acetaminophen (NORCO/VICODIN) 5-325 MG  per tablet Take 1 tablet by mouth every 6 (six) hours as needed for moderate pain. 08/28/13   Dalia Heading, PA-C  ibuprofen (ADVIL,MOTRIN) 800 MG tablet Take 1 tablet (800 mg total) by mouth 3 (three) times daily. For back pain 09/12/14   Billy Fischer, MD  ibuprofen (ADVIL,MOTRIN) 800 MG tablet Take 1 tablet (800 mg total) by mouth 3 (three) times daily. 09/12/14   Billy Fischer, MD  LORazepam (ATIVAN) 1 MG tablet Take 1 mg by mouth daily as needed for anxiety.     Historical Provider, MD  metaxalone (SKELAXIN) 800  MG tablet Take 1 tablet (800 mg total) by mouth 3 (three) times daily. 09/12/14   Billy Fischer, MD  methocarbamol (ROBAXIN) 500 MG tablet Take 1 tablet (500 mg total) by mouth 2 (two) times daily. 06/03/14   Domenic Moras, PA-C  Multiple Vitamins-Minerals (MULTIVITAMINS THER. W/MINERALS) TABS Take 1 tablet by mouth daily.    Historical Provider, MD  oxyCODONE-acetaminophen (PERCOCET) 10-325 MG per tablet Take 1 tablet by mouth every 4 (four) hours as needed for pain. 01/12/13   Antonietta Breach, PA-C  potassium chloride (K-DUR,KLOR-CON) 10 MEQ tablet Take 10 mEq by mouth daily.    Historical Provider, MD  promethazine (PHENERGAN) 25 MG tablet Take 25 mg by mouth every 6 (six) hours as needed for nausea or vomiting (nausea & vomiting).     Historical Provider, MD   BP 143/91 mmHg  Pulse 91  Temp(Src) 99.9 F (37.7 C) (Oral)  Resp 16  SpO2 99% Physical Exam  Constitutional: She is oriented to person, place, and time. She appears well-developed and well-nourished.  Abdominal: Soft. Bowel sounds are normal. She exhibits no distension and no mass. There is no tenderness. There is no rebound and no guarding.  Musculoskeletal: She exhibits tenderness.       Lumbar back: She exhibits decreased range of motion, tenderness, pain and spasm. She exhibits no swelling and normal pulse.       Back:  Neurological: She is alert and oriented to person, place, and time.  Skin: Skin is warm and dry.  Nursing note and vitals reviewed.   ED Course  Procedures (including critical care time) Labs Review Labs Reviewed - No data to display  Imaging Review No results found.   MDM   1. Low back pain without sciatica, unspecified back pain laterality        Billy Fischer, MD 09/12/14 2129

## 2014-09-12 NOTE — Discharge Instructions (Signed)
See orthopedist if further problems °

## 2014-09-20 DIAGNOSIS — L732 Hidradenitis suppurativa: Secondary | ICD-10-CM | POA: Diagnosis not present

## 2014-09-20 DIAGNOSIS — D869 Sarcoidosis, unspecified: Secondary | ICD-10-CM | POA: Diagnosis not present

## 2014-09-20 DIAGNOSIS — Z79891 Long term (current) use of opiate analgesic: Secondary | ICD-10-CM | POA: Diagnosis not present

## 2014-09-27 NOTE — ED Notes (Signed)
Pharmacist  From [facility]  Called  Skelaxin not  Covered      Dr  Bridgett Larsson  Notified    RX FLEXERIL 5  MG  1  TID  QUANTITY  30   nr  TO  [place]   [address]  [address]

## 2015-01-09 ENCOUNTER — Emergency Department (HOSPITAL_COMMUNITY)
Admission: EM | Admit: 2015-01-09 | Discharge: 2015-01-09 | Disposition: A | Discharge status: Home / Self Care | Payer: Medicare Other | Source: Home / Self Care

## 2015-08-16 ENCOUNTER — Emergency Department (HOSPITAL_COMMUNITY): Payer: Medicare Other

## 2015-08-16 ENCOUNTER — Encounter (HOSPITAL_COMMUNITY): Payer: Self-pay

## 2015-08-16 ENCOUNTER — Emergency Department (HOSPITAL_COMMUNITY)
Admission: EM | Admit: 2015-08-16 | Discharge: 2015-08-16 | Disposition: A | Discharge status: Home / Self Care | Payer: Medicare Other | Attending: Emergency Medicine | Admitting: Emergency Medicine

## 2015-08-16 DIAGNOSIS — Z7982 Long term (current) use of aspirin: Secondary | ICD-10-CM | POA: Insufficient documentation

## 2015-08-16 DIAGNOSIS — R102 Pelvic and perineal pain: Secondary | ICD-10-CM | POA: Insufficient documentation

## 2015-08-16 DIAGNOSIS — R079 Chest pain, unspecified: Secondary | ICD-10-CM | POA: Diagnosis not present

## 2015-08-16 DIAGNOSIS — R1084 Generalized abdominal pain: Secondary | ICD-10-CM | POA: Diagnosis present

## 2015-08-16 DIAGNOSIS — Z87891 Personal history of nicotine dependence: Secondary | ICD-10-CM | POA: Insufficient documentation

## 2015-08-16 DIAGNOSIS — I10 Essential (primary) hypertension: Secondary | ICD-10-CM | POA: Insufficient documentation

## 2015-08-16 DIAGNOSIS — R0789 Other chest pain: Secondary | ICD-10-CM | POA: Insufficient documentation

## 2015-08-16 DIAGNOSIS — J45909 Unspecified asthma, uncomplicated: Secondary | ICD-10-CM | POA: Insufficient documentation

## 2015-08-16 DIAGNOSIS — Z79899 Other long term (current) drug therapy: Secondary | ICD-10-CM | POA: Diagnosis not present

## 2015-08-16 DIAGNOSIS — R0602 Shortness of breath: Secondary | ICD-10-CM | POA: Diagnosis not present

## 2015-08-16 LAB — CBC
HCT: 32.7 % — ABNORMAL LOW (ref 36.0–46.0)
Hemoglobin: 11 g/dL — ABNORMAL LOW (ref 12.0–15.0)
MCH: 24.3 pg — ABNORMAL LOW (ref 26.0–34.0)
MCHC: 33.6 g/dL (ref 30.0–36.0)
MCV: 72.3 fL — ABNORMAL LOW (ref 78.0–100.0)
Platelets: 337 10*3/uL (ref 150–400)
RBC: 4.52 MIL/uL (ref 3.87–5.11)
RDW: 15.5 % (ref 11.5–15.5)
WBC: 10.2 10*3/uL (ref 4.0–10.5)

## 2015-08-16 LAB — WET PREP, GENITAL
Clue Cells Wet Prep HPF POC: NONE SEEN
Sperm: NONE SEEN
Trich, Wet Prep: NONE SEEN
Yeast Wet Prep HPF POC: NONE SEEN

## 2015-08-16 LAB — HEPATIC FUNCTION PANEL
ALT: 18 U/L (ref 14–54)
AST: 23 U/L (ref 15–41)
Albumin: 3.2 g/dL — ABNORMAL LOW (ref 3.5–5.0)
Alkaline Phosphatase: 47 U/L (ref 38–126)
Bilirubin, Direct: 0.2 mg/dL (ref 0.1–0.5)
Indirect Bilirubin: 0.1 mg/dL — ABNORMAL LOW (ref 0.3–0.9)
Total Bilirubin: 0.3 mg/dL (ref 0.3–1.2)
Total Protein: 7.1 g/dL (ref 6.5–8.1)

## 2015-08-16 LAB — BASIC METABOLIC PANEL
Anion gap: 8 (ref 5–15)
BUN: 6 mg/dL (ref 6–20)
CO2: 24 mmol/L (ref 22–32)
Calcium: 8.9 mg/dL (ref 8.9–10.3)
Chloride: 103 mmol/L (ref 101–111)
Creatinine, Ser: 1 mg/dL (ref 0.44–1.00)
GFR calc Af Amer: >60 mL/min (ref >60)
GFR calc non Af Amer: >60 mL/min (ref >60)
Glucose, Bld: 111 mg/dL — ABNORMAL HIGH (ref 65–99)
Potassium: 3.3 mmol/L — ABNORMAL LOW (ref 3.5–5.1)
Sodium: 135 mmol/L (ref 135–145)

## 2015-08-16 LAB — HCG, QUANTITATIVE, PREGNANCY: hCG, Beta Chain, Quant, S: <1 m[IU]/mL (ref <5)

## 2015-08-16 LAB — I-STAT TROPONIN, ED
Troponin i, poc: 0.01 ng/mL (ref 0.00–0.08)
Troponin i, poc: 0 ng/mL (ref 0.00–0.08)

## 2015-08-16 LAB — I-STAT BETA HCG BLOOD, ED (MC, WL, AP ONLY): I-stat hCG, quantitative: 6.5 m[IU]/mL — ABNORMAL HIGH (ref <5)

## 2015-08-16 LAB — LIPASE, BLOOD: Lipase: 22 U/L (ref 11–51)

## 2015-08-16 MED ORDER — IPRATROPIUM-ALBUTEROL 0.5-2.5 (3) MG/3ML IN SOLN
3.0000 mL | Freq: Once | RESPIRATORY_TRACT | Status: AC
  Administered 2015-08-16: 3 mL via RESPIRATORY_TRACT
  Filled 2015-08-16: qty 3

## 2015-08-16 MED ORDER — MORPHINE SULFATE (PF) 4 MG/ML IV SOLN
4.0000 mg | Freq: Once | INTRAVENOUS | Status: AC
Start: 2015-08-16 — End: 2015-08-16
  Administered 2015-08-16: 4 mg via INTRAVENOUS
  Filled 2015-08-16: qty 1

## 2015-08-16 MED ORDER — AZITHROMYCIN 250 MG PO TABS
1000.0000 mg | ORAL_TABLET | Freq: Once | ORAL | Status: AC
  Administered 2015-08-16: 1000 mg via ORAL
  Filled 2015-08-16: qty 4

## 2015-08-16 MED ORDER — HYDROCODONE-ACETAMINOPHEN 5-325 MG PO TABS
1.0000 | ORAL_TABLET | Freq: Once | ORAL | Status: AC
  Administered 2015-08-16: 1 via ORAL
  Filled 2015-08-16: qty 1

## 2015-08-16 MED ORDER — CEFTRIAXONE SODIUM 250 MG IJ SOLR
250.0000 mg | Freq: Once | INTRAMUSCULAR | Status: AC
  Administered 2015-08-16: 250 mg via INTRAMUSCULAR
  Filled 2015-08-16: qty 250

## 2015-08-16 MED ORDER — IOPAMIDOL (ISOVUE-370) INJECTION 76%
INTRAVENOUS | Status: AC
  Administered 2015-08-16: 75 mL
  Filled 2015-08-16: qty 100

## 2015-08-16 MED ORDER — DIPHENHYDRAMINE HCL 50 MG/ML IJ SOLN
25.0000 mg | Freq: Once | INTRAMUSCULAR | Status: AC
  Administered 2015-08-16: 25 mg via INTRAVENOUS
  Filled 2015-08-16: qty 1

## 2015-08-16 MED ORDER — HYDROCORTISONE NA SUCCINATE PF 250 MG IJ SOLR
200.0000 mg | Freq: Once | INTRAMUSCULAR | Status: AC
  Administered 2015-08-16: 200 mg via INTRAVENOUS
  Filled 2015-08-16: qty 200

## 2015-08-16 MED ORDER — SODIUM CHLORIDE 0.9 % IV BOLUS (SEPSIS)
1000.0000 mL | Freq: Once | INTRAVENOUS | Status: AC
  Administered 2015-08-16: 1000 mL via INTRAVENOUS

## 2015-08-16 MED ORDER — DIPHENHYDRAMINE HCL 50 MG/ML IJ SOLN
50.0000 mg | Freq: Once | INTRAMUSCULAR | Status: AC
  Administered 2015-08-16: 50 mg via INTRAVENOUS
  Filled 2015-08-16: qty 1

## 2015-08-16 MED ORDER — LIDOCAINE HCL (PF) 1 % IJ SOLN
2.0000 mL | Freq: Once | INTRAMUSCULAR | Status: AC
  Administered 2015-08-16: 2 mL via INTRADERMAL

## 2015-08-16 MED ORDER — PANTOPRAZOLE SODIUM 40 MG PO TBEC
40.0000 mg | DELAYED_RELEASE_TABLET | Freq: Once | ORAL | Status: AC
  Administered 2015-08-16: 40 mg via ORAL
  Filled 2015-08-16: qty 1

## 2015-08-16 MED ORDER — MORPHINE SULFATE (PF) 4 MG/ML IV SOLN
4.0000 mg | Freq: Once | INTRAVENOUS | Status: AC
  Administered 2015-08-16: 4 mg via INTRAVENOUS
  Filled 2015-08-16: qty 1

## 2015-08-16 NOTE — ED Provider Notes (Signed)
Care assumed from [facility] 1500. Please see his note for initial history and physical.  In short Crystal Bullock presents with abdominal pain and is being treated for STD. She also has a few days of sharp chest pain and new right bundle-branch block on EKG. Hemodynamically stable. She has now allergy to contrast and is awaiting a CT angiogram of the chest. Will continue to monitor.  CT angiogram of the chest does not show any evidence of pulmonary embolism. Patient was discharged home in good condition.  [name], MD 08/17/15 [id number]  Jola Schmidt, MD 08/17/15 2312

## 2015-08-16 NOTE — Discharge Instructions (Signed)
Please read and follow all provided instructions.  Your diagnoses today include:  1. Chest wall pain   2. Pelvic pain in female    Tests performed today include:  An EKG of your heart  A chest x-ray  Cardiac enzymes - a blood test for heart muscle damage  Blood counts and electrolytes  CT Angiography  Vital signs. See below for your results today.   Medications prescribed:   Take any prescribed medications only as directed.  Follow-up instructions: Please follow-up with your primary care provider as soon as you can for further evaluation of your symptoms.   Return instructions:  SEEK IMMEDIATE MEDICAL ATTENTION IF:  You have severe chest pain, especially if the pain is crushing or pressure-like and spreads to the arms, back, neck, or jaw, or if you have sweating, nausea (feeling sick to your stomach), or shortness of breath. THIS IS AN EMERGENCY. Don't wait to see if the pain will go away. Get medical help at once. Call 911 or 0 (operator). DO NOT drive yourself to the hospital.   Your chest pain gets worse and does not go away with rest.   You have an attack of chest pain lasting longer than usual, despite rest and treatment with the medications your caregiver has prescribed.   You wake from sleep with chest pain or shortness of breath.  You feel dizzy or faint.  You have chest pain not typical of your usual pain for which you originally saw your caregiver.   You have any other emergent concerns regarding your health.  Additional Information: Chest pain comes from many different causes. Your caregiver has diagnosed you as having chest pain that is not specific for one problem, but does not require admission.  You are at low risk for an acute heart condition or other serious illness.   Your vital signs today were: BP 144/90 mmHg   Pulse 75   Temp(Src) 98.3 F (36.8 C) (Oral)   Resp 20   Ht 5\' 7"  (1.702 m)   Wt 104.327 kg   BMI 36.01 kg/m2   SpO2 96% If your blood  pressure (BP) was elevated above 135/85 this visit, please have this repeated by your doctor within one month. --------------    Chest Wall Pain Chest wall pain is pain in or around the bones and muscles of your chest. Sometimes, an injury causes this pain. Sometimes, the cause may not be known. This pain may take several weeks or longer to get better. HOME CARE INSTRUCTIONS  Pay attention to any changes in your symptoms. Take these actions to help with your pain:   Rest as told by your health care provider.   Avoid activities that cause pain. These include any activities that use your chest muscles or your abdominal and side muscles to lift heavy items.   If directed, apply ice to the painful area:  Put ice in a plastic bag.  Place a towel between your skin and the bag.  Leave the ice on for 20 minutes, 2-3 times per day.  Take over-the-counter and prescription medicines only as told by your health care provider.  Do not use tobacco products, including cigarettes, chewing tobacco, and e-cigarettes. If you need help quitting, ask your health care provider.  Keep all follow-up visits as told by your health care provider. This is important. SEEK MEDICAL CARE IF:  You have a fever.  Your chest pain becomes worse.  You have new symptoms. SEEK IMMEDIATE MEDICAL CARE IF:  You have nausea or vomiting.  You feel sweaty or light-headed.  You have a cough with phlegm (sputum) or you cough up blood.  You develop shortness of breath.   This information is not intended to replace advice given to you by your health care provider. Make sure you discuss any questions you have with your health care provider.   Document Released: 01/28/2005 Document Revised: 10/19/2014 Document Reviewed: 04/25/2014 Elsevier Interactive Patient Education [employer].

## 2015-08-16 NOTE — ED Notes (Signed)
Called Lab about results to the Beta hCG, stated, "They ordered the wrong test. Patient will need to have the hCG, quantitative order." RN changed order.

## 2015-08-16 NOTE — ED Notes (Addendum)
Patient complains of epigastric and chest pain since Friday. Pain with movement and inspiration. Hurts to move arms and painful to wear bra. Using inhaler ion arrival. NAD

## 2015-08-16 NOTE — Progress Notes (Signed)
Dr. Sharl Ma and Dr. Deno Etienne agreed upon the emergent 4 hour premeds for the CT scan. Patient allergic to IV dye. Physicians spoke over phone.

## 2015-08-16 NOTE — ED Notes (Signed)
Patient returned from X-ray 

## 2015-08-16 NOTE — ED Notes (Signed)
Pt given [place] sandwich and applesauce per PA Dorothea Ogle approval

## 2015-08-16 NOTE — ED Provider Notes (Signed)
CSN: [id number]     Arrival date & time 08/16/15  1031 History   First MD Initiated Contact with Patient 08/16/15 1046     Chief Complaint  Patient presents with  . Abdominal Pain   (Consider location/radiation/quality/duration/timing/severity/associated sxs/prior Treatment) HPI  50 y.o. female with a hx of HTN, Asthma, presents to the Emergency Department today with abdominal pain and chest pain. 1) PT states that she has been having abdominal pain since Friday. States that the pain is generalized, but feels more so in the lower abdomen. Notes cramping sensation bilaterally. Worse with PO intake. Pt states that she feels pain in lower abdomen when she eats. No burning sensation. Pt has had cholecystectomy. No fevers. No N/V/D. Last BM yesterday. No blood in stool. Pt does endorse clear vaginal discharge. No dysuria.  2) Pt also states that the chest pain began this AM and caused her to come to the emergency department. Centrally located with no radiation. Notes worsened with movement of arms. Notes constant 10/10 pain that is worsened with breathing. Pt states that she recently drove to [place] and did not take breaks while driving to walk around. No hx DVT/PE. No recent surgeries. No exogenous estrogen usage. No unilateral calf tenderness. No hx MI/ACS. Has HTN. No DM or HLD. No FH ACS. Of note, pt does endorse heavy lifting in [place] on Saturday on helping move furniture. No other symptoms noted.   Past Medical History  Diagnosis Date  . Hypertension   . Sarcoidosis ([place])   . Fibroid   . Asthma   . Scoliosis   . Sickle cell trait ([place])   . Seasonal allergies    Past Surgical History  Procedure Laterality Date  . Axillary lymph node dissection    . Cholecystectomy     No family history on file. Social History  Substance Use Topics  . Smoking status: Former Smoker    Types: Cigarettes  . Smokeless tobacco: None  . Alcohol Use: Yes     Comment: occasional    OB History     No data available     Review of Systems ROS reviewed and all are negative for acute change except as noted in the HPI.  Allergies  Ivp dye  Home Medications   Prior to Admission medications   Medication Sig Start Date End Date Taking? Authorizing Provider  albuterol (PROVENTIL HFA;VENTOLIN HFA) 108 (90 BASE) MCG/ACT inhaler Inhale 2 puffs into the lungs every 6 (six) hours as needed for wheezing (wheezing).     Historical Provider, MD  albuterol (PROVENTIL) (2.5 MG/3ML) 0.083% nebulizer solution Take 2.5 mg by nebulization every 6 (six) hours as needed for wheezing (wheezing).     Historical Provider, MD  amLODipine (NORVASC) 10 MG tablet Take 10 mg by mouth daily.    Historical Provider, MD  Ascorbic Acid (VITAMIN C) 1000 MG tablet Take 1,000 mg by mouth daily.    Historical Provider, MD  aspirin-sod bicarb-citric acid (ALKA-SELTZER) 325 MG TBEF tablet Take 325 mg by mouth every 6 (six) hours as needed (for congestion).    Historical Provider, MD  cetirizine (ZYRTEC) 10 MG tablet Take 10 mg by mouth daily as needed for allergies (allergies).     Historical Provider, MD  cyclobenzaprine (FLEXERIL) 5 MG tablet Take 1 tablet (5 mg total) by mouth 3 (three) times daily as needed for muscle spasms. 01/28/14   Carvel Getting, NP  doxycycline (VIBRA-TABS) 100 MG tablet Take 100 mg by mouth 2 (  two) times daily as needed (flare ups). For flare ups    Historical Provider, MD  esomeprazole (NEXIUM) 40 MG capsule Take 40 mg by mouth daily before breakfast.    Historical Provider, MD  fluticasone (FLONASE) 50 MCG/ACT nasal spray Place 2 sprays into both nostrils daily as needed. allergies 05/20/14   Historical Provider, MD  HYDROcodone-acetaminophen (NORCO/VICODIN) 5-325 MG per tablet Take 1 tablet by mouth every 6 (six) hours as needed for moderate pain. 08/28/13   Dalia Heading, PA-C  ibuprofen (ADVIL,MOTRIN) 800 MG tablet Take 1 tablet (800 mg total) by mouth 3 (three) times daily. For back pain  09/12/14   Billy Fischer, MD  ibuprofen (ADVIL,MOTRIN) 800 MG tablet Take 1 tablet (800 mg total) by mouth 3 (three) times daily. 09/12/14   Billy Fischer, MD  LORazepam (ATIVAN) 1 MG tablet Take 1 mg by mouth daily as needed for anxiety.     Historical Provider, MD  metaxalone (SKELAXIN) 800 MG tablet Take 1 tablet (800 mg total) by mouth 3 (three) times daily. 09/12/14   Billy Fischer, MD  methocarbamol (ROBAXIN) 500 MG tablet Take 1 tablet (500 mg total) by mouth 2 (two) times daily. 06/03/14   Domenic Moras, PA-C  Multiple Vitamins-Minerals (MULTIVITAMINS THER. W/MINERALS) TABS Take 1 tablet by mouth daily.    Historical Provider, MD  oxyCODONE-acetaminophen (PERCOCET) 10-325 MG per tablet Take 1 tablet by mouth every 4 (four) hours as needed for pain. 01/12/13   Antonietta Breach, PA-C  potassium chloride (K-DUR,KLOR-CON) 10 MEQ tablet Take 10 mEq by mouth daily.    Historical Provider, MD  promethazine (PHENERGAN) 25 MG tablet Take 25 mg by mouth every 6 (six) hours as needed for nausea or vomiting (nausea & vomiting).     Historical Provider, MD   BP 160/93 mmHg  Pulse 89  Temp(Src) 98.3 F (36.8 C) (Oral)  Resp 16  Ht 5\' 7"  (1.702 m)  Wt 104.327 kg  BMI 36.01 kg/m2  SpO2 94%   Physical Exam  Constitutional: She is oriented to person, place, and time. She appears well-developed and well-nourished.  HENT:  Head: Normocephalic and atraumatic.  Eyes: EOM are normal. Pupils are equal, round, and reactive to light.  Neck: Normal range of motion. Neck supple. No tracheal deviation present.  Cardiovascular: Normal rate, regular rhythm, normal heart sounds and intact distal pulses.   No murmur heard. Pulmonary/Chest: Effort normal. No accessory muscle usage. No respiratory distress. She has no decreased breath sounds. She has wheezes in the right lower field, the left upper field and the left lower field. She has no rhonchi. She has no rales. She exhibits tenderness.  Abdominal: Soft. Normal appearance  and bowel sounds are normal. There is generalized tenderness. There is no rigidity, no rebound, no guarding, no CVA tenderness, no tenderness at McBurney's point and negative Murphy's sign.  Abdomen soft  Genitourinary: Cervix exhibits discharge (clear/white). Cervix exhibits no motion tenderness. Right adnexum displays no mass, no tenderness and no fullness. Left adnexum displays no mass, no tenderness and no fullness.  Chaperone present  Musculoskeletal: Normal range of motion.  Neurological: She is alert and oriented to person, place, and time.  Skin: Skin is warm and dry.  Psychiatric: She has a normal mood and affect. Her behavior is normal. Thought content normal.  Nursing note and vitals reviewed.  ED Course  Procedures (including critical care time) Labs Review Labs Reviewed  WET PREP, GENITAL - Abnormal; Notable for the following:  WBC, Wet Prep HPF POC FEW (*)    All other components within normal limits  BASIC METABOLIC PANEL - Abnormal; Notable for the following:    Potassium 3.3 (*)    Glucose, Bld 111 (*)    All other components within normal limits  CBC - Abnormal; Notable for the following:    Hemoglobin 11.0 (*)    HCT 32.7 (*)    MCV 72.3 (*)    MCH 24.3 (*)    All other components within normal limits  HEPATIC FUNCTION PANEL - Abnormal; Notable for the following:    Albumin 3.2 (*)    Indirect Bilirubin 0.1 (*)    All other components within normal limits  I-STAT BETA HCG BLOOD, ED ([facility], [facility], [facility] ONLY) - Abnormal; Notable for the following:    I-stat hCG, quantitative 6.5 (*)    All other components within normal limits  LIPASE, BLOOD  I-STAT TROPOININ, ED  I-STAT TROPOININ, ED  GC/CHLAMYDIA PROBE AMP ([place]) NOT AT [facility]   Imaging Review Dg Chest 2 View  08/16/2015  CLINICAL DATA:  Left-sided chest pain. EXAM: CHEST  2 VIEW COMPARISON:  08/06/2013 FINDINGS: Low volume chest. There is no edema, consolidation, effusion, or pneumothorax. Normal heart size  and mediastinal contours. Chronic sclerotic appearance of the bones, potentially related to patient's sarcoidosis history. IMPRESSION: No evidence of active disease. Electronically Signed   By: Monte Fantasia M.D.   On: 08/16/2015 11:34   I have personally reviewed and evaluated these images and lab results as part of my medical decision-making.   EKG Interpretation   Date/Time:  Wednesday August 16 2015 10:36:49 EDT Ventricular Rate:  91 PR Interval:  154 QRS Duration: 136 QT Interval:  410 QTC Calculation: 504 R Axis:   94 Text Interpretation:  Normal sinus rhythm Right bundle branch block T wave  abnormality, consider inferior ischemia Abnormal ECG Otherwise no  significant change Confirmed by FLOYD MD, Quillian Quince [id number]) on 08/16/2015  12:19:17 PM      MDM  I have reviewed and evaluated the relevant laboratory valuesI have reviewed and evaluated the relevant imaging studies. I have interpreted the relevant EKG.I have reviewed the relevant previous healthcare records.I obtained HPI from historian. Patient discussed with supervising physician  ED Course:  Assessment:  Pt is a 49yF presents with CP since this AM that has been constant without relief. Pt is TTP on chest wall. Risk Factors HTN. Given morphine in ED with relief. Patient is to be discharged with recommendation to follow up with PCP in regards to today's hospital visit. Chest pain is not likely of cardiac or pulmonary etiology d/t presentation, VSS, no tracheal deviation, no JVD or new murmur, RRR, breath sounds with bilateral wheeze, negative troponin x2, and negative CXR. Heart Score 2.Given duoneb treatment with improvement of wheezing. Due to patients long distance travel and recent new EKG changes with RBBB, a CT Angio was ordered to eval for PE. Due to allergy to contrast, Solucortef was given and pt must wait 4 hours before imaging. Patient also does not have a surgical abdomen and there are no peritoneal signs. Labs  unremarkable. No indication of appendicitis, bowel obstruction, bowel perforation, cholecystitis, diverticulitis, PID or ectopic pregnancy. Repeat examination of abdomen non TTP. Pelvic exam suspicious for STI based on discharge on exam. GC was obtained. Will give Rocephin/Azithro. Pt has been advised start a PPI and return to the ED is CP becomes exertional, associated with diaphoresis or nausea, radiates to left jaw/arm,  worsens or becomes concerning in any way. Pt appears reliable for follow up and is agreeable to discharge. Patient is in no acute distress. Vital Signs are stable. Patient is able to ambulate. Patient able to tolerate PO.   Disposition/Plan:  Dispo pending CT Angio for PE. If unremarkable, DC with NSAID treatment for Chest wall pain.  2:57 PM Sign Out: [name], MD  Supervising Physician Deno Etienne, DO   Final diagnoses:  None      Shary Decamp, PA-C 08/16/15 [employer], DO 08/16/15 1501

## 2015-08-17 LAB — GC/CHLAMYDIA PROBE AMP (~~LOC~~) NOT AT ARMC
Chlamydia: NEGATIVE
Neisseria Gonorrhea: NEGATIVE

## 2015-08-25 ENCOUNTER — Emergency Department (HOSPITAL_COMMUNITY)
Admission: EM | Admit: 2015-08-25 | Discharge: 2015-08-25 | Disposition: A | Discharge status: Home / Self Care | Payer: Medicare Other | Attending: Emergency Medicine | Admitting: Emergency Medicine

## 2015-08-25 ENCOUNTER — Encounter (HOSPITAL_COMMUNITY): Payer: Self-pay | Admitting: Emergency Medicine

## 2015-08-25 ENCOUNTER — Emergency Department (HOSPITAL_COMMUNITY): Payer: Medicare Other

## 2015-08-25 DIAGNOSIS — M25512 Pain in left shoulder: Secondary | ICD-10-CM | POA: Diagnosis not present

## 2015-08-25 DIAGNOSIS — Z7982 Long term (current) use of aspirin: Secondary | ICD-10-CM | POA: Diagnosis not present

## 2015-08-25 DIAGNOSIS — I10 Essential (primary) hypertension: Secondary | ICD-10-CM | POA: Diagnosis not present

## 2015-08-25 DIAGNOSIS — R0789 Other chest pain: Secondary | ICD-10-CM | POA: Insufficient documentation

## 2015-08-25 DIAGNOSIS — G44309 Post-traumatic headache, unspecified, not intractable: Secondary | ICD-10-CM | POA: Diagnosis not present

## 2015-08-25 DIAGNOSIS — G44319 Acute post-traumatic headache, not intractable: Secondary | ICD-10-CM | POA: Diagnosis not present

## 2015-08-25 DIAGNOSIS — Z79899 Other long term (current) drug therapy: Secondary | ICD-10-CM | POA: Insufficient documentation

## 2015-08-25 DIAGNOSIS — Z87891 Personal history of nicotine dependence: Secondary | ICD-10-CM | POA: Insufficient documentation

## 2015-08-25 DIAGNOSIS — R51 Headache: Secondary | ICD-10-CM | POA: Diagnosis not present

## 2015-08-25 DIAGNOSIS — J45909 Unspecified asthma, uncomplicated: Secondary | ICD-10-CM | POA: Insufficient documentation

## 2015-08-25 DIAGNOSIS — S4992XA Unspecified injury of left shoulder and upper arm, initial encounter: Secondary | ICD-10-CM | POA: Diagnosis not present

## 2015-08-25 LAB — PREGNANCY, URINE: Preg Test, Ur: NEGATIVE

## 2015-08-25 MED ORDER — IPRATROPIUM-ALBUTEROL 0.5-2.5 (3) MG/3ML IN SOLN
3.0000 mL | Freq: Once | RESPIRATORY_TRACT | Status: AC
  Administered 2015-08-25: 3 mL via RESPIRATORY_TRACT
  Filled 2015-08-25: qty 3

## 2015-08-25 MED ORDER — IBUPROFEN 200 MG PO TABS
600.0000 mg | ORAL_TABLET | Freq: Once | ORAL | Status: AC
  Administered 2015-08-25: 600 mg via ORAL
  Filled 2015-08-25: qty 3

## 2015-08-25 NOTE — Discharge Instructions (Signed)
Treatment: Take ibuprofen or Aleve as prescribed over-the-counter for your pain. Ice your shoulder at least 3-4 times daily alternating 20 minutes on, 20 minutes off.  Follow-up: Please follow-up with your primary care provider next week for follow-up of today's visit and recheck of symptoms. Please follow-up with Dr. Debroah Loop office, an orthopedic doctor, if you continue to have pain in your shoulder. Please return to the emergency department if he develop any new or worsening symptoms.   Shoulder Pain The shoulder is the joint that connects your arms to your body. The bones that form the shoulder joint include the upper arm bone (humerus), the shoulder blade (scapula), and the collarbone (clavicle). The top of the humerus is shaped like a ball and fits into a rather flat socket on the scapula (glenoid cavity). A combination of muscles and strong, fibrous tissues that connect muscles to bones (tendons) support your shoulder joint and hold the ball in the socket. Small, fluid-filled sacs (bursae) are located in different areas of the joint. They act as cushions between the bones and the overlying soft tissues and help reduce friction between the gliding tendons and the bone as you move your arm. Your shoulder joint allows a wide range of motion in your arm. This range of motion allows you to do things like scratch your back or throw a ball. However, this range of motion also makes your shoulder more prone to pain from overuse and injury. Causes of shoulder pain can originate from both injury and overuse and usually can be grouped in the following four categories:  Redness, swelling, and pain (inflammation) of the tendon (tendinitis) or the bursae (bursitis).  Instability, such as a dislocation of the joint.  Inflammation of the joint (arthritis).  Broken bone (fracture). HOME CARE INSTRUCTIONS   Apply ice to the sore area.  Put ice in a plastic bag.  Place a towel between your skin and the  bag.  Leave the ice on for 15-20 minutes, 3-4 times per day for the first 2 days, or as directed by your health care provider.  Stop using cold packs if they do not help with the pain.  If you have a shoulder sling or immobilizer, wear it as long as your caregiver instructs. Only remove it to shower or bathe. Move your arm as little as possible, but keep your hand moving to prevent swelling.  Squeeze a soft ball or foam pad as much as possible to help prevent swelling.  Only take over-the-counter or prescription medicines for pain, discomfort, or fever as directed by your caregiver. SEEK MEDICAL CARE IF:   Your shoulder pain increases, or new pain develops in your arm, hand, or fingers.  Your hand or fingers become cold and numb.  Your pain is not relieved with medicines. SEEK IMMEDIATE MEDICAL CARE IF:   Your arm, hand, or fingers are numb or tingling.  Your arm, hand, or fingers are significantly swollen or turn white or blue. MAKE SURE YOU:   Understand these instructions.  Will watch your condition.  Will get help right away if you are not doing well or get worse.   This information is not intended to replace advice given to you by your health care provider. Make sure you discuss any questions you have with your health care provider.   Document Released: 11/07/2004 Document Revised: 02/18/2014 Document Reviewed: 05/23/2014 Elsevier Interactive Patient Education [employer].

## 2015-08-25 NOTE — ED Notes (Signed)
Pt in from home via [facility] after ceiling caved in above, hitting pt's head and L shoulder. Per EMS, scene was very dusty and pt was having apparent asthma attack when they arrived, breathing 50/min, audible global wheezes, was given 1 DuoNeb in truck. Pt arrived a&ox4, some expiratory wheezes, able to speak in full sentences. Sats 98% in RA, c/o L shoulder pain and chest tightness.

## 2015-08-25 NOTE — ED Notes (Signed)
EKG completed upon arrival, will export when ordered.

## 2015-08-25 NOTE — ED Provider Notes (Signed)
CSN: [id number]     Arrival date & time 08/25/15  1042 History   First MD Initiated Contact with Patient 08/25/15 1102     Chief Complaint  Patient presents with  . Chest Pain  . Shortness of Breath  . Shoulder Pain     (Consider location/radiation/quality/duration/timing/severity/associated sxs/prior Treatment) HPI Comments: Patient is a 50 year old female with history of asthma, sarcoidosis who presents with left shoulder pain, headache, chest tightness, and resolved shortness of breath following ceiling collapse around 9 AM. Patient states she was at a friend's house when the ceiling collapsed on them. She said she put both arms up and a piece of sheet rock split in half and hit mostly her left side. Patient states she also was hit in head. Patient reported that she has felt lightheaded since the event and had blurred vision after the incident, which is now resolved. Patient continues to have headache throughout her entire head that she rates as an 8/10.  Patient has pain to her left shoulder and paresthesias to her entire arm, especially at her left AC. She denies any back or neck pain, only some soreness to the left side of her neck approaching her shoulder. Patient was having an asthma attack when EMS arrived and gave DuoNeb, which has resolved the patient's shortness of breath. Patient states that her "face felt like waves"when she was short of breath, but has resolved now that she is not breathing so heavily. Patient denies any chest pain, only chest tightness with inspiration that is similar to past asthma attacks. Patient also denies any abdominal pain, nausea, vomiting, dysuria.  Patient is a 50 y.o. female presenting with chest pain, shortness of breath, and shoulder pain. The history is provided by the patient.  Chest Pain Associated symptoms: headache, nausea (immediately following incident, now resolved) and shortness of breath   Associated symptoms: no abdominal pain, no back pain,  no fever and not vomiting   Shortness of Breath Associated symptoms: chest pain and headaches   Associated symptoms: no abdominal pain, no fever, no neck pain, no rash and no vomiting   Shoulder Pain Associated symptoms: no back pain, no fever and no neck pain     Past Medical History  Diagnosis Date  . Hypertension   . Sarcoidosis ([place])   . Fibroid   . Asthma   . Scoliosis   . Sickle cell trait ([place])   . Seasonal allergies    Past Surgical History  Procedure Laterality Date  . Axillary lymph node dissection    . Cholecystectomy     No family history on file. Social History  Substance Use Topics  . Smoking status: Former Research scientist (life sciences)  . Smokeless tobacco: None  . Alcohol Use: Yes     Comment: occasional    OB History    No data available     Review of Systems  Constitutional: Negative for fever and chills.  HENT: Negative for facial swelling.   Respiratory: Positive for shortness of breath.   Cardiovascular: Positive for chest pain.  Gastrointestinal: Positive for nausea (immediately following incident, now resolved). Negative for vomiting and abdominal pain.  Genitourinary: Negative for dysuria.  Musculoskeletal: Positive for arthralgias (L shoulder). Negative for back pain and neck pain.  Skin: Negative for rash and wound.  Neurological: Positive for light-headedness and headaches.  Psychiatric/Behavioral: The patient is not nervous/anxious.       Allergies  Ivp dye  Home Medications   Prior to Admission medications   Medication Sig  Start Date End Date Taking? Authorizing Provider  albuterol (PROVENTIL HFA;VENTOLIN HFA) 108 (90 BASE) MCG/ACT inhaler Inhale 2 puffs into the lungs every 6 (six) hours as needed for wheezing (wheezing).     Historical Provider, MD  albuterol (PROVENTIL) (2.5 MG/3ML) 0.083% nebulizer solution Take 2.5 mg by nebulization every 6 (six) hours as needed for wheezing (wheezing).     Historical Provider, MD  amLODipine (NORVASC) 10 MG  tablet Take 10 mg by mouth daily.    Historical Provider, MD  amoxicillin-clavulanate (AUGMENTIN) 875-125 MG tablet Take 1 tablet by mouth 2 (two) times daily as needed. 05/12/15   Historical Provider, MD  Ascorbic Acid (VITAMIN C) 1000 MG tablet Take 1,000 mg by mouth daily.    Historical Provider, MD  aspirin-sod bicarb-citric acid (ALKA-SELTZER) 325 MG TBEF tablet Take 325 mg by mouth every 6 (six) hours as needed (for congestion).    Historical Provider, MD  cetirizine (ZYRTEC) 10 MG tablet Take 10 mg by mouth daily as needed for allergies (allergies).     Historical Provider, MD  esomeprazole (NEXIUM) 40 MG capsule Take 40 mg by mouth daily before breakfast.    Historical Provider, MD  fluticasone (FLONASE) 50 MCG/ACT nasal spray Place 2 sprays into both nostrils daily as needed. allergies 05/20/14   Historical Provider, MD  ibuprofen (ADVIL,MOTRIN) 800 MG tablet Take 1 tablet (800 mg total) by mouth 3 (three) times daily. 09/12/14   Billy Fischer, MD  LORazepam (ATIVAN) 1 MG tablet Take 1 mg by mouth daily as needed for anxiety.     Historical Provider, MD  metaxalone (SKELAXIN) 800 MG tablet Take 1 tablet (800 mg total) by mouth 3 (three) times daily. 09/12/14   Billy Fischer, MD  methocarbamol (ROBAXIN) 500 MG tablet Take 1 tablet (500 mg total) by mouth 2 (two) times daily. 06/03/14   Domenic Moras, PA-C  Multiple Vitamins-Minerals (MULTIVITAMINS THER. W/MINERALS) TABS Take 1 tablet by mouth daily.    Historical Provider, MD  oxyCODONE-acetaminophen (PERCOCET) 10-325 MG per tablet Take 1 tablet by mouth every 4 (four) hours as needed for pain. 01/12/13   Antonietta Breach, PA-C  promethazine (PHENERGAN) 25 MG tablet Take 25 mg by mouth every 6 (six) hours as needed for nausea or vomiting (nausea & vomiting).     Historical Provider, MD   BP 159/96 mmHg  Pulse 79  Temp(Src) 98.7 F (37.1 C) (Oral)  Resp 12  Ht 5\' 7"  (1.702 m)  Wt 99.791 kg  BMI 34.45 kg/m2  SpO2 100% Physical Exam  Constitutional: She  appears well-developed and well-nourished. No distress.  HENT:  Head: Normocephalic and atraumatic.  Mouth/Throat: Oropharynx is clear and moist.  No tenderness to palpation  Eyes: Conjunctivae are normal. Pupils are equal, round, and reactive to light. Right eye exhibits no discharge. Left eye exhibits no discharge. No scleral icterus.  Neck: Normal range of motion. Neck supple. No thyromegaly present.  Cardiovascular: Normal rate, regular rhythm, normal heart sounds and intact distal pulses.  Exam reveals no gallop and no friction rub.   No murmur heard. Pulmonary/Chest: Effort normal and breath sounds normal. No stridor. No respiratory distress. She has no wheezes. She has no rales.  Abdominal: Soft. Bowel sounds are normal. She exhibits no distension. There is no tenderness. There is no rebound and no guarding.  Musculoskeletal: She exhibits no edema.       Left shoulder: She exhibits tenderness, bony tenderness, pain and decreased strength (5/5, decreased in comparison to R). She  exhibits no laceration and normal pulse.       Arms: Left shoulder: Positive empty can test  Lymphadenopathy:    She has no cervical adenopathy.  Neurological: She is alert. Coordination normal.  CN 3-12 intact; normal sensation throughout; 5/5 strength in all 4 extremities; equal bilateral grip strength; no ataxia on finger to nose; no pronator drift; possible nystagmus with R EOMs   Skin: Skin is warm and dry. No rash noted. She is not diaphoretic. No pallor.  Psychiatric: She has a normal mood and affect.  Nursing note and vitals reviewed.   ED Course  Procedures (including critical care time) Labs Review Labs Reviewed  PREGNANCY, URINE    Imaging Review Ct Head Wo Contrast  08/25/2015  CLINICAL DATA:  Feeling fell on left shoulder and head.  Head pain. EXAM: CT HEAD WITHOUT CONTRAST TECHNIQUE: Contiguous axial images were obtained from the base of the skull through the vertex without intravenous  contrast. COMPARISON:  None. FINDINGS: There is no evidence of mass effect, midline shift or extra-axial fluid collections. There is no evidence of a space-occupying lesion or intracranial hemorrhage. There is no evidence of a cortical-based area of acute infarction. The ventricles and sulci are appropriate for the patient's age. The basal cisterns are patent. Visualized portions of the orbits are unremarkable. The visualized portions of the paranasal sinuses and mastoid air cells are unremarkable. The osseous structures are unremarkable. IMPRESSION: No acute intracranial pathology. Electronically Signed   By: Kathreen Devoid   On: 08/25/2015 12:09   Dg Shoulder Left  08/25/2015  CLINICAL DATA:  The ceiling fell on left shoulder this morning. Pain. EXAM: LEFT SHOULDER - 2+ VIEW COMPARISON:  08/28/2013 FINDINGS: Mild degenerative changes in the left AC joint. Glenohumeral joint is maintained. No acute bony abnormality. Specifically, no fracture, subluxation, or dislocation. Soft tissues are intact. IMPRESSION: No acute bony abnormality. Electronically Signed   By: Rolm Baptise M.D.   On: 08/25/2015 12:52   I have personally reviewed and evaluated these images and lab results as part of my medical decision-making.   EKG Interpretation None      MDM   CT head shows no acute intracranial pathology. Left shoulder x-ray shows no acute bony abnormality. Suspect contusion and/or tendinous injury. Patient fitted with sling for comfort. Patient advised to use supportive treatment such as NSAIDs and ice. Patient follow-up with PCP next week for follow-up. Patient also advised to follow-up with orthopedics if her shoulder pain continues. Patient understands and agrees with plan. Strict return precautions discussed. Patient vitals stable throughout ED course and discharged in satisfactory condition. I discussed patient with Dr. Kathrynn Humble who is in agreement with plan.  Final diagnoses:  Shoulder pain, acute, left    Acute post-traumatic headache, not intractable        Frederica Kuster, PA-C 08/25/15 1504  Varney Biles, MD 08/27/15 1914

## 2015-08-25 NOTE — ED Notes (Signed)
Shoulder sling at bedside.

## 2015-08-25 NOTE — ED Notes (Signed)
Pt taken to CT scan.

## 2015-09-05 ENCOUNTER — Other Ambulatory Visit: Payer: Self-pay | Admitting: Gastroenterology

## 2015-09-05 DIAGNOSIS — D6489 Other specified anemias: Secondary | ICD-10-CM | POA: Diagnosis not present

## 2015-09-05 DIAGNOSIS — R1314 Dysphagia, pharyngoesophageal phase: Secondary | ICD-10-CM | POA: Diagnosis not present

## 2015-09-05 DIAGNOSIS — K219 Gastro-esophageal reflux disease without esophagitis: Secondary | ICD-10-CM | POA: Diagnosis not present

## 2015-09-05 DIAGNOSIS — R079 Chest pain, unspecified: Secondary | ICD-10-CM | POA: Diagnosis not present

## 2015-09-05 DIAGNOSIS — R1032 Left lower quadrant pain: Secondary | ICD-10-CM

## 2015-09-12 ENCOUNTER — Ambulatory Visit
Admission: RE | Admit: 2015-09-12 | Discharge: 2015-09-12 | Disposition: A | Discharge status: Home / Self Care | Payer: Medicare Other | Source: Ambulatory Visit | Attending: Gastroenterology | Admitting: Gastroenterology

## 2015-09-12 DIAGNOSIS — R1032 Left lower quadrant pain: Secondary | ICD-10-CM

## 2015-09-12 DIAGNOSIS — K5730 Diverticulosis of large intestine without perforation or abscess without bleeding: Secondary | ICD-10-CM | POA: Diagnosis not present

## 2015-09-12 MED ORDER — IOPAMIDOL (ISOVUE-M 300) INJECTION 61%: 15.0000 mL | Freq: Once | INTRAMUSCULAR | Status: DC | PRN

## 2015-09-12 MED ORDER — IOPAMIDOL (ISOVUE-300) INJECTION 61%
125.0000 mL | Freq: Once | INTRAVENOUS | Status: AC | PRN
  Administered 2015-09-12: 125 mL via INTRAVENOUS

## 2015-09-18 DIAGNOSIS — R1032 Left lower quadrant pain: Secondary | ICD-10-CM | POA: Diagnosis not present

## 2015-09-18 DIAGNOSIS — K580 Irritable bowel syndrome with diarrhea: Secondary | ICD-10-CM | POA: Diagnosis not present

## 2015-09-18 DIAGNOSIS — R197 Diarrhea, unspecified: Secondary | ICD-10-CM | POA: Diagnosis not present

## 2015-09-18 DIAGNOSIS — K5732 Diverticulitis of large intestine without perforation or abscess without bleeding: Secondary | ICD-10-CM | POA: Diagnosis not present

## 2015-09-18 DIAGNOSIS — K219 Gastro-esophageal reflux disease without esophagitis: Secondary | ICD-10-CM | POA: Diagnosis not present

## 2015-09-20 DIAGNOSIS — K580 Irritable bowel syndrome with diarrhea: Secondary | ICD-10-CM | POA: Diagnosis not present

## 2015-10-04 ENCOUNTER — Other Ambulatory Visit (HOSPITAL_COMMUNITY)
Admission: RE | Admit: 2015-10-04 | Discharge: 2015-10-04 | Disposition: A | Discharge status: Home / Self Care | Payer: Medicare Other | Source: Ambulatory Visit | Attending: Obstetrics and Gynecology | Admitting: Obstetrics and Gynecology

## 2015-10-04 ENCOUNTER — Other Ambulatory Visit: Payer: Self-pay | Admitting: Obstetrics and Gynecology

## 2015-10-04 DIAGNOSIS — Z01411 Encounter for gynecological examination (general) (routine) with abnormal findings: Secondary | ICD-10-CM | POA: Insufficient documentation

## 2015-10-04 DIAGNOSIS — N761 Subacute and chronic vaginitis: Secondary | ICD-10-CM | POA: Diagnosis not present

## 2015-10-04 DIAGNOSIS — Z1151 Encounter for screening for human papillomavirus (HPV): Secondary | ICD-10-CM | POA: Insufficient documentation

## 2015-10-04 DIAGNOSIS — N921 Excessive and frequent menstruation with irregular cycle: Secondary | ICD-10-CM | POA: Diagnosis not present

## 2015-10-04 DIAGNOSIS — N9419 Other specified dyspareunia: Secondary | ICD-10-CM | POA: Diagnosis not present

## 2015-10-04 DIAGNOSIS — D252 Subserosal leiomyoma of uterus: Secondary | ICD-10-CM | POA: Diagnosis not present

## 2015-10-04 DIAGNOSIS — N925 Other specified irregular menstruation: Secondary | ICD-10-CM | POA: Diagnosis not present

## 2015-10-05 LAB — CYTOLOGY - PAP

## 2015-10-06 ENCOUNTER — Encounter (INDEPENDENT_AMBULATORY_CARE_PROVIDER_SITE_OTHER): Payer: Self-pay

## 2015-10-06 ENCOUNTER — Encounter: Payer: Self-pay | Admitting: Cardiovascular Disease

## 2015-10-06 ENCOUNTER — Ambulatory Visit (INDEPENDENT_AMBULATORY_CARE_PROVIDER_SITE_OTHER): Payer: Medicare Other | Admitting: Cardiovascular Disease

## 2015-10-06 VITALS — BP 140/97 | HR 92 | Ht 67.0 in | Wt 220.0 lb

## 2015-10-06 DIAGNOSIS — R0683 Snoring: Secondary | ICD-10-CM

## 2015-10-06 DIAGNOSIS — D869 Sarcoidosis, unspecified: Secondary | ICD-10-CM | POA: Diagnosis not present

## 2015-10-06 DIAGNOSIS — F419 Anxiety disorder, unspecified: Secondary | ICD-10-CM

## 2015-10-06 DIAGNOSIS — J4520 Mild intermittent asthma, uncomplicated: Secondary | ICD-10-CM

## 2015-10-06 DIAGNOSIS — J45909 Unspecified asthma, uncomplicated: Secondary | ICD-10-CM | POA: Insufficient documentation

## 2015-10-06 DIAGNOSIS — R0789 Other chest pain: Secondary | ICD-10-CM

## 2015-10-06 DIAGNOSIS — R079 Chest pain, unspecified: Secondary | ICD-10-CM

## 2015-10-06 DIAGNOSIS — R002 Palpitations: Secondary | ICD-10-CM | POA: Insufficient documentation

## 2015-10-06 DIAGNOSIS — I10 Essential (primary) hypertension: Secondary | ICD-10-CM

## 2015-10-06 DIAGNOSIS — I4510 Unspecified right bundle-branch block: Secondary | ICD-10-CM

## 2015-10-06 DIAGNOSIS — R0602 Shortness of breath: Secondary | ICD-10-CM | POA: Diagnosis not present

## 2015-10-06 HISTORY — DX: Unspecified right bundle-branch block: I45.10

## 2015-10-06 HISTORY — DX: Palpitations: R00.2

## 2015-10-06 HISTORY — DX: Other chest pain: R07.89

## 2015-10-06 HISTORY — DX: Anxiety disorder, unspecified: F41.9

## 2015-10-06 MED ORDER — METOPROLOL SUCCINATE ER 25 MG PO TB24: 25.0000 mg | ORAL_TABLET | Freq: Every day | ORAL | 5 refills | Status: DC

## 2015-10-06 NOTE — Patient Instructions (Addendum)
Medication Instructions:  START METOPROLOL SUCC 25 MG DAILY  Labwork: NONE  Testing/Procedures: Your physician has requested that you have an echocardiogram. Echocardiography is a painless test that uses sound waves to create images of your heart. It provides your doctor with information about the size and shape of your heart and how well your heart's chambers and valves are working. This procedure takes approximately one hour. There are no restrictions for this procedure. [place] [address]  Your physician has requested that you have en exercise stress myoview. For further information please visit [email or web address]. Please follow instruction sheet, as given.  Your physician has recommended that you have a sleep study. This test records several body functions during sleep, including: brain activity, eye movement, oxygen and carbon dioxide blood levels, heart rate and rhythm, breathing rate and rhythm, the flow of air through your mouth and nose, snoring, body muscle movements, and chest and belly movement.  Follow-Up: Your physician recommends that you schedule a follow-up appointment in: [place]  If you need a refill on your cardiac medications before your next appointment, please call your pharmacy.

## 2015-10-06 NOTE — Progress Notes (Signed)
Cardiology Office Note   Date:  10/06/2015   ID:  Crystal Bullock, Crystal Bullock 11-21-1965, MRN [id number]  PCP:  Ricke Hey, MD  Cardiologist:   Skeet Latch, MD   Chief Complaint  Patient presents with  . New Patient (Initial Visit)    sob; frequently. Pt states no other Sx or concerns.      History of Present Illness: Crystal Bullock is a 50 y.o. female with hypertension, asthma and sarcoidosis who presents for an evaluation of chest pain and shortness of breath.  Crystal Bullock was seen in the ED 08/25/15 with left shoulder pain, chest tightness, and shortness of breath.  She reports over one year of shortness of breath with minimal exertion.  She notes that the symptoms are worse when it is hot or humid outside. The episodes improve with rest. She does endorse orthopnea but denies PND. She has mild bilateral lower extremity edema in the feet when she stands for prolonged periods of time, but it improves with elevation.  She also reports substernal chest pain. These episodes typically occurred in settings of anxiety. The pain is sharp most days but she also reports chest pressure. The chest pressure is 8 out of 10 in severity and does not radiate. She does note associated palpitations but denies lightheadedness or dizziness. She also denies nausea or diaphoresis. She sometimes has numbness in her left arm and leg when she has the chest pain. She reported these symptoms to her gastroenterologist who recommended that she be evaluated by cardiologist.  Crystal Bullock mildly and sometimes awakening herself from sleep. She isn't sure whether she has apneic episodes. She does not feel well-rested in the mornings and frequently feels tired throughout the day.  She reports having a sleep study several years ago that was negative for sleep apnea. However, she is not sure that she actually slept and she was anxious about having the procedure done. She also reports having a stress test done many  years ago that was negative.   Past Medical History:  Diagnosis Date  . Anxiety 10/06/2015  . Asthma   . Atypical chest pain 10/06/2015  . Fibroid   . Hypertension   . Palpitations 10/06/2015  . RBBB 10/06/2015  . Sarcoidosis ([place])   . Scoliosis   . Seasonal allergies   . Sickle cell trait [facility])     Past Surgical History:  Procedure Laterality Date  . AXILLARY LYMPH NODE DISSECTION    . CHOLECYSTECTOMY       Current Outpatient Prescriptions  Medication Sig Dispense Refill  . albuterol (PROVENTIL HFA;VENTOLIN HFA) 108 (90 BASE) MCG/ACT inhaler Inhale 2 puffs into the lungs every 6 (six) hours as needed for wheezing (wheezing).     Marland Kitchen albuterol (PROVENTIL) (2.5 MG/3ML) 0.083% nebulizer solution Take 2.5 mg by nebulization every 6 (six) hours as needed for wheezing (wheezing).     Marland Kitchen amLODipine (NORVASC) 10 MG tablet Take 10 mg by mouth daily.    Marland Kitchen amoxicillin-clavulanate (AUGMENTIN) 875-125 MG tablet Take 1 tablet by mouth 2 (two) times daily as needed.  3  . Ascorbic Acid (VITAMIN C) 1000 MG tablet Take 1,000 mg by mouth daily.    Marland Kitchen aspirin-sod bicarb-citric acid (ALKA-SELTZER) 325 MG TBEF tablet Take 325 mg by mouth every 6 (six) hours as needed (for congestion).    . cetirizine (ZYRTEC) 10 MG tablet Take 10 mg by mouth daily as needed for allergies (allergies).     Marland Kitchen esomeprazole (NEXIUM) 40 MG capsule  Take 40 mg by mouth daily before breakfast.    . fluticasone (FLONASE) 50 MCG/ACT nasal spray Place 2 sprays into both nostrils daily as needed. allergies  0  . ibuprofen (ADVIL,MOTRIN) 800 MG tablet Take 1 tablet (800 mg total) by mouth 3 (three) times daily. 30 tablet 0  . LORazepam (ATIVAN) 1 MG tablet Take 1 mg by mouth daily as needed for anxiety.     . Multiple Vitamins-Minerals (MULTIVITAMINS THER. W/MINERALS) TABS Take 1 tablet by mouth daily.    Marland Kitchen oxyCODONE-acetaminophen (PERCOCET) 10-325 MG per tablet Take 1 tablet by mouth every 4 (four) hours as needed for pain. 7 tablet 0   . promethazine (PHENERGAN) 25 MG tablet Take 25 mg by mouth every 6 (six) hours as needed for nausea or vomiting (nausea & vomiting).     . metoprolol succinate (TOPROL XL) 25 MG 24 hr tablet Take 1 tablet (25 mg total) by mouth daily. 30 tablet 5   No current facility-administered medications for this visit.     Allergies:   Ivp dye [iodinated diagnostic agents]    Social History:  The patient  reports that she has quit smoking. She does not have any smokeless tobacco history on file. She reports that she drinks alcohol. She reports that she does not use drugs.   Family History:  The patient's family history includes Diabetes in her mother; Hypertension in her mother.    ROS:  Please see the history of present illness.   Otherwise, review of systems are positive for none.   All other systems are reviewed and negative.    PHYSICAL EXAM: VS:  BP (!) 140/97   Pulse 92   Ht 5\' 7"  (1.702 m)   Wt 220 lb (99.8 kg)   LMP 08/24/2015 Comment: irregular periods per pt 09/12/15  BMI 34.46 kg/m  , BMI Body mass index is 34.46 kg/m. GENERAL:  Well appearing HEENT:  Pupils equal round and reactive, fundi not visualized, oral mucosa unremarkable NECK:  No jugular venous distention, waveform within normal limits, carotid upstroke brisk and symmetric, no bruits, no thyromegaly LYMPHATICS:  No cervical adenopathy LUNGS:  Clear to auscultation bilaterally HEART:  RRR.  PMI not displaced or sustained,S1 and S2 within normal limits, no S3, no S4, no clicks, no rubs, no  murmurs ABD:  Flat, positive bowel sounds normal in frequency in pitch, no bruits, no rebound, no guarding, no midline pulsatile mass, no hepatomegaly, no splenomegaly EXT:  2 plus pulses throughout, no edema, no cyanosis no clubbing SKIN:  No rashes no nodules NEURO:  Cranial nerves II through XII grossly intact, motor grossly intact throughout PSYCH:  Cognitively intact, oriented to person place and time   EKG:  EKG is ordered  today. The ekg ordered today demonstrates sinus rhythm. Rate 92 bpm. Right bundle branch block.   Recent Labs: 08/16/2015: ALT 18; BUN 6; Creatinine, Ser 1.00; Hemoglobin 11.0; Platelets 337; Potassium 3.3; Sodium 135    Lipid Panel No results found for: CHOL, TRIG, HDL, CHOLHDL, VLDL, LDLCALC, LDLDIRECT    Wt Readings from Last 3 Encounters:  10/06/15 220 lb (99.8 kg)  08/25/15 220 lb (99.8 kg)  08/16/15 230 lb (104.3 kg)      ASSESSMENT AND PLAN:  # Atypcial chest pain:  # Shortness of breath: Crystal Bullock symptoms are very atypical.  However she does not exert herself   much to know if her symptoms are exertional. Therefore, we will obtain an exercise Myoview to evaluate for ischemia.  We will also obtain an echocardiogram given her report of lower extremity edema and shortness of breath. She does have a history of sarcoidosis and we want to make sure that there is no evidence of cardiac sarcoid.  It seems that her symptoms are also tied to   # RBBB:  This is new since 07/2013. However, she had a CT scan on 08/2015 that was negative for pulmonary embolism. No other workup is indicated at this time.   # Hypertension: Blood pressure is above goal.  Start metoprolol 25mg  daily as above. This should also help with her palpitations.   Current medicines are reviewed at length with the patient today.  The patient does not have concerns regarding medicines.  The following changes have been made:  no change  Labs/ tests ordered today include:   Orders Placed This Encounter  Procedures  . Myocardial Perfusion Imaging  . EKG 12-Lead  . ECHOCARDIOGRAM COMPLETE  . Split night study     Disposition:   FU with Kaien Pezzullo C. Oval Linsey, MD, [facility] in 1 month.     This note was written with the assistance of speech recognition software.  Please excuse any transcriptional errors.  Signed, Mekhi Sonn C. Oval Linsey, MD, [facility]  10/06/2015 4:38 PM    [place]

## 2015-10-10 DIAGNOSIS — K5732 Diverticulitis of large intestine without perforation or abscess without bleeding: Secondary | ICD-10-CM | POA: Diagnosis not present

## 2015-10-10 DIAGNOSIS — R938 Abnormal findings on diagnostic imaging of other specified body structures: Secondary | ICD-10-CM | POA: Diagnosis not present

## 2015-10-10 DIAGNOSIS — R1314 Dysphagia, pharyngoesophageal phase: Secondary | ICD-10-CM | POA: Diagnosis not present

## 2015-10-10 DIAGNOSIS — K219 Gastro-esophageal reflux disease without esophagitis: Secondary | ICD-10-CM | POA: Diagnosis not present

## 2015-10-12 ENCOUNTER — Telehealth (HOSPITAL_COMMUNITY): Payer: Self-pay

## 2015-10-12 NOTE — Telephone Encounter (Signed)
Encounter complete. 

## 2015-10-17 ENCOUNTER — Inpatient Hospital Stay (HOSPITAL_COMMUNITY): Admission: RE | Admit: 2015-10-17 | Payer: Medicare Other | Source: Ambulatory Visit

## 2015-10-19 ENCOUNTER — Telehealth (HOSPITAL_COMMUNITY): Payer: Self-pay

## 2015-10-19 NOTE — Telephone Encounter (Signed)
Encounter complete. 

## 2015-10-24 ENCOUNTER — Encounter (HOSPITAL_COMMUNITY): Payer: Medicare Other

## 2015-10-26 ENCOUNTER — Other Ambulatory Visit (HOSPITAL_COMMUNITY): Payer: Medicare Other

## 2015-10-31 DIAGNOSIS — D252 Subserosal leiomyoma of uterus: Secondary | ICD-10-CM | POA: Diagnosis not present

## 2015-10-31 DIAGNOSIS — N924 Excessive bleeding in the premenopausal period: Secondary | ICD-10-CM | POA: Diagnosis not present

## 2015-10-31 DIAGNOSIS — D259 Leiomyoma of uterus, unspecified: Secondary | ICD-10-CM | POA: Diagnosis not present

## 2015-11-01 ENCOUNTER — Telehealth (HOSPITAL_COMMUNITY): Payer: Self-pay

## 2015-11-01 NOTE — Telephone Encounter (Signed)
Encounter complete. 

## 2015-11-02 ENCOUNTER — Ambulatory Visit (HOSPITAL_COMMUNITY)
Admission: RE | Admit: 2015-11-02 | Discharge: 2015-11-02 | Disposition: A | Discharge status: Home / Self Care | Payer: Medicare Other | Source: Ambulatory Visit | Attending: Cardiovascular Disease | Admitting: Cardiovascular Disease

## 2015-11-02 DIAGNOSIS — R079 Chest pain, unspecified: Secondary | ICD-10-CM | POA: Diagnosis not present

## 2015-11-02 DIAGNOSIS — R0602 Shortness of breath: Secondary | ICD-10-CM | POA: Diagnosis not present

## 2015-11-02 LAB — MYOCARDIAL PERFUSION IMAGING
Estimated workload: 8.2 METS
Exercise duration (min): 8 min
Exercise duration (sec): 1 s
LV dias vol: 94 mL (ref 46–106)
LV sys vol: 44 mL
MPHR: 172 {beats}/min
Peak HR: 162 {beats}/min
Percent HR: 94 %
RPE: 18
Rest HR: 67 {beats}/min
SDS: 2
SRS: 0
SSS: 2
TID: 1.15

## 2015-11-02 MED ORDER — TECHNETIUM TC 99M TETROFOSMIN IV KIT
10.9000 | PACK | Freq: Once | INTRAVENOUS | Status: AC | PRN
  Administered 2015-11-02: 10.9 via INTRAVENOUS
  Filled 2015-11-02: qty 11

## 2015-11-02 MED ORDER — TECHNETIUM TC 99M TETROFOSMIN IV KIT
32.6000 | PACK | Freq: Once | INTRAVENOUS | Status: AC | PRN
  Administered 2015-11-02: 32.6 via INTRAVENOUS
  Filled 2015-11-02: qty 33

## 2015-11-03 ENCOUNTER — Other Ambulatory Visit (HOSPITAL_COMMUNITY): Payer: Medicare Other

## 2015-11-03 ENCOUNTER — Telehealth: Payer: Self-pay | Admitting: *Deleted

## 2015-11-03 NOTE — Telephone Encounter (Signed)
Spoke with patient regarding her Echo she cancelled Offered to reschedule her Echo and follow up ov, she was not aware of follow up with Dr Oval Linsey next week. Patient stated she was not feeling well today Requested that she call back next week and reschedule Echo and OV, stated she would

## 2015-11-09 ENCOUNTER — Ambulatory Visit: Payer: Medicare Other | Admitting: Cardiovascular Disease

## 2015-11-13 ENCOUNTER — Ambulatory Visit (HOSPITAL_BASED_OUTPATIENT_CLINIC_OR_DEPARTMENT_OTHER): Payer: Medicare Other | Attending: Cardiovascular Disease | Admitting: Cardiology

## 2015-11-13 DIAGNOSIS — E669 Obesity, unspecified: Secondary | ICD-10-CM | POA: Insufficient documentation

## 2015-11-13 DIAGNOSIS — G4710 Hypersomnia, unspecified: Secondary | ICD-10-CM

## 2015-11-13 DIAGNOSIS — Z6834 Body mass index (BMI) 34.0-34.9, adult: Secondary | ICD-10-CM | POA: Insufficient documentation

## 2015-11-13 DIAGNOSIS — R0683 Snoring: Secondary | ICD-10-CM | POA: Diagnosis not present

## 2015-11-13 DIAGNOSIS — G4719 Other hypersomnia: Secondary | ICD-10-CM | POA: Diagnosis not present

## 2015-11-13 DIAGNOSIS — F513 Sleepwalking [somnambulism]: Secondary | ICD-10-CM | POA: Diagnosis not present

## 2015-11-13 DIAGNOSIS — R5383 Other fatigue: Secondary | ICD-10-CM | POA: Diagnosis not present

## 2015-11-13 DIAGNOSIS — I10 Essential (primary) hypertension: Secondary | ICD-10-CM | POA: Insufficient documentation

## 2015-11-20 NOTE — Procedures (Signed)
   Patient Name: Crystal Bullock, Crystal Bullock Date: 11/13/2015 Gender: Female D.O.B: 1965-12-19 Age (years): 50 Referring Provider: Skeet Latch Height (inches): 51 Interpreting Physician: Fransico Him MD, ABSM Weight (lbs): 220 RPSGT: Laren Everts BMI: 34 MRN: [id number] Neck Size: 16.00  CLINICAL INFORMATION Sleep Study Type: NPSG Indication for sleep study: Fatigue, Hypertension, Obesity, Re-Evaluation, Sleep walking/talking/parasomnias, Snoring, Witnessed Apneas Epworth Sleepiness Score:12   SLEEP STUDY TECHNIQUE As per the AASM Manual for the Scoring of Sleep and Associated Events v2.3 (April 2016) with a hypopnea requiring 4% desaturations. The channels recorded and monitored were frontal, central and occipital EEG, electrooculogram (EOG), submentalis EMG (chin), nasal and oral airflow, thoracic and abdominal wall motion, anterior tibialis EMG, snore microphone, electrocardiogram, and pulse oximetry.  MEDICATIONS Patient's medications include: Reviewed in the chart. Medications self-administered by patient during sleep study : No sleep medicine administered.  SLEEP ARCHITECTURE The study was initiated at 10:22:59 PM and ended at 5:16:20 AM. Sleep onset time was 129.8 minutes and the sleep efficiency was 47.4%. The total sleep time was 196.0 minutes. Stage REM latency was 117.5 minutes. The patient spent 23.47% of the night in stage N1 sleep, 55.61% in stage N2 sleep, 0.00% in stage N3 and 20.92% in REM. Alpha intrusion was absent. Supine sleep was 0.51%.  RESPIRATORY PARAMETERS The overall apnea/hypopnea index (AHI) was 0.0 per hour. There were 0 total apneas, including 0 obstructive, 0 central and 0 mixed apneas. There were 0 hypopneas and 2 RERAs. The AHI during Stage REM sleep was 0.0 per hour. AHI while supine was 0.0 per hour. The mean oxygen saturation was 97.12%. The minimum SpO2 during sleep was 92.00%. Moderate snoring was noted during this study.  CARDIAC  DATA The 2 lead EKG demonstrated sinus rhythm. The mean heart rate was 82.46 beats per minute. Other EKG findings include: None.  LEG MOVEMENT DATA The total PLMS were 1 with a resulting PLMS index of 0.31. Associated arousal with leg movement index was 0.3 .  IMPRESSIONS - No significant obstructive sleep apnea occurred during this study (AHI = 0.0/h). - No significant central sleep apnea occurred during this study (CAI = 0.0/h). - The patient had minimal or no oxygen desaturation during the study (Min O2 = 92.00%) - The patient snored with Moderate snoring volume. - No cardiac abnormalities were noted during this study. - Clinically significant periodic limb movements did not occur during sleep. No significant associated arousals.  DIAGNOSIS - Normal study  RECOMMENDATIONS - Avoid alcohol, sedatives and other CNS depressants that may worsen sleep apnea and disrupt normal sleep architecture. - Sleep hygiene should be reviewed to assess factors that may improve sleep quality. - Weight management and regular exercise should be initiated or continued if appropriate.   The [place], American Board of Sleep Medicine  ELECTRONICALLY SIGNED ON:  11/20/2015, 8:47 PM [place] PH: [phone]   FX: [phone] [place]

## 2015-11-21 NOTE — Progress Notes (Signed)
Patient informed of information. Stated verbal understanding.

## 2015-12-06 DIAGNOSIS — D120 Benign neoplasm of cecum: Secondary | ICD-10-CM | POA: Diagnosis not present

## 2015-12-06 DIAGNOSIS — K2900 Acute gastritis without bleeding: Secondary | ICD-10-CM | POA: Diagnosis not present

## 2015-12-06 DIAGNOSIS — K648 Other hemorrhoids: Secondary | ICD-10-CM | POA: Diagnosis not present

## 2015-12-06 DIAGNOSIS — R1310 Dysphagia, unspecified: Secondary | ICD-10-CM | POA: Diagnosis not present

## 2015-12-06 DIAGNOSIS — R197 Diarrhea, unspecified: Secondary | ICD-10-CM | POA: Diagnosis not present

## 2015-12-06 DIAGNOSIS — K644 Residual hemorrhoidal skin tags: Secondary | ICD-10-CM | POA: Diagnosis not present

## 2015-12-06 DIAGNOSIS — K5732 Diverticulitis of large intestine without perforation or abscess without bleeding: Secondary | ICD-10-CM | POA: Diagnosis not present

## 2015-12-06 DIAGNOSIS — K635 Polyp of colon: Secondary | ICD-10-CM | POA: Diagnosis not present

## 2015-12-06 DIAGNOSIS — D122 Benign neoplasm of ascending colon: Secondary | ICD-10-CM | POA: Diagnosis not present

## 2015-12-06 DIAGNOSIS — K2970 Gastritis, unspecified, without bleeding: Secondary | ICD-10-CM | POA: Diagnosis not present

## 2015-12-06 DIAGNOSIS — K219 Gastro-esophageal reflux disease without esophagitis: Secondary | ICD-10-CM | POA: Diagnosis not present

## 2015-12-06 DIAGNOSIS — K5730 Diverticulosis of large intestine without perforation or abscess without bleeding: Secondary | ICD-10-CM | POA: Diagnosis not present

## 2015-12-06 DIAGNOSIS — Z1211 Encounter for screening for malignant neoplasm of colon: Secondary | ICD-10-CM | POA: Diagnosis not present

## 2015-12-06 DIAGNOSIS — D126 Benign neoplasm of colon, unspecified: Secondary | ICD-10-CM | POA: Diagnosis not present

## 2015-12-06 DIAGNOSIS — K6389 Other specified diseases of intestine: Secondary | ICD-10-CM | POA: Diagnosis not present

## 2015-12-13 DIAGNOSIS — Z1211 Encounter for screening for malignant neoplasm of colon: Secondary | ICD-10-CM | POA: Diagnosis not present

## 2015-12-13 DIAGNOSIS — D126 Benign neoplasm of colon, unspecified: Secondary | ICD-10-CM | POA: Diagnosis not present

## 2015-12-25 ENCOUNTER — Other Ambulatory Visit (HOSPITAL_COMMUNITY): Payer: Medicare Other

## 2016-01-16 ENCOUNTER — Ambulatory Visit (HOSPITAL_COMMUNITY): Payer: Medicare Other | Attending: Cardiology

## 2016-04-11 ENCOUNTER — Other Ambulatory Visit: Payer: Self-pay | Admitting: Cardiovascular Disease

## 2016-04-12 NOTE — Telephone Encounter (Signed)
Refill Request.  

## 2016-04-12 NOTE — Telephone Encounter (Signed)
Rx(s) sent to pharmacy electronically.  

## 2016-04-18 DIAGNOSIS — Z79891 Long term (current) use of opiate analgesic: Secondary | ICD-10-CM | POA: Diagnosis not present

## 2016-05-20 DIAGNOSIS — Z79891 Long term (current) use of opiate analgesic: Secondary | ICD-10-CM | POA: Diagnosis not present

## 2016-05-28 DIAGNOSIS — R197 Diarrhea, unspecified: Secondary | ICD-10-CM | POA: Diagnosis not present

## 2016-05-28 DIAGNOSIS — K6389 Other specified diseases of intestine: Secondary | ICD-10-CM | POA: Diagnosis not present

## 2016-05-28 DIAGNOSIS — Z8719 Personal history of other diseases of the digestive system: Secondary | ICD-10-CM | POA: Diagnosis not present

## 2016-05-28 DIAGNOSIS — K219 Gastro-esophageal reflux disease without esophagitis: Secondary | ICD-10-CM | POA: Diagnosis not present

## 2016-06-10 ENCOUNTER — Emergency Department (HOSPITAL_COMMUNITY)
Admission: EM | Admit: 2016-06-10 | Discharge: 2016-06-11 | Disposition: A | Discharge status: Home / Self Care | Payer: Medicare Other | Attending: Emergency Medicine | Admitting: Emergency Medicine

## 2016-06-10 ENCOUNTER — Emergency Department (HOSPITAL_COMMUNITY): Payer: Medicare Other

## 2016-06-10 ENCOUNTER — Encounter (HOSPITAL_COMMUNITY): Payer: Self-pay

## 2016-06-10 ENCOUNTER — Other Ambulatory Visit: Payer: Self-pay

## 2016-06-10 DIAGNOSIS — Z79899 Other long term (current) drug therapy: Secondary | ICD-10-CM | POA: Diagnosis not present

## 2016-06-10 DIAGNOSIS — I10 Essential (primary) hypertension: Secondary | ICD-10-CM | POA: Diagnosis not present

## 2016-06-10 DIAGNOSIS — J45909 Unspecified asthma, uncomplicated: Secondary | ICD-10-CM | POA: Diagnosis not present

## 2016-06-10 DIAGNOSIS — R0789 Other chest pain: Secondary | ICD-10-CM

## 2016-06-10 DIAGNOSIS — Z87891 Personal history of nicotine dependence: Secondary | ICD-10-CM | POA: Diagnosis not present

## 2016-06-10 DIAGNOSIS — R079 Chest pain, unspecified: Secondary | ICD-10-CM

## 2016-06-10 LAB — BASIC METABOLIC PANEL
Anion gap: 10 (ref 5–15)
BUN: 5 mg/dL — ABNORMAL LOW (ref 6–20)
CO2: 25 mmol/L (ref 22–32)
Calcium: 9.2 mg/dL (ref 8.9–10.3)
Chloride: 103 mmol/L (ref 101–111)
Creatinine, Ser: 0.89 mg/dL (ref 0.44–1.00)
GFR calc Af Amer: >60 mL/min (ref >60)
GFR calc non Af Amer: >60 mL/min (ref >60)
Glucose, Bld: 79 mg/dL (ref 65–99)
Potassium: 3.3 mmol/L — ABNORMAL LOW (ref 3.5–5.1)
Sodium: 138 mmol/L (ref 135–145)

## 2016-06-10 LAB — CBC
HCT: 33.3 % — ABNORMAL LOW (ref 36.0–46.0)
Hemoglobin: 11.5 g/dL — ABNORMAL LOW (ref 12.0–15.0)
MCH: 23.8 pg — ABNORMAL LOW (ref 26.0–34.0)
MCHC: 34.5 g/dL (ref 30.0–36.0)
MCV: 68.8 fL — ABNORMAL LOW (ref 78.0–100.0)
Platelets: 241 10*3/uL (ref 150–400)
RBC: 4.84 MIL/uL (ref 3.87–5.11)
RDW: 17.1 % — ABNORMAL HIGH (ref 11.5–15.5)
WBC: 7.7 10*3/uL (ref 4.0–10.5)

## 2016-06-10 LAB — I-STAT TROPONIN, ED: Troponin i, poc: 0 ng/mL (ref 0.00–0.08)

## 2016-06-10 MED ORDER — ETOMIDATE 2 MG/ML IV SOLN
INTRAVENOUS | Status: AC
  Filled 2016-06-10: qty 10

## 2016-06-10 MED ORDER — KETOROLAC TROMETHAMINE 30 MG/ML IJ SOLN
30.0000 mg | Freq: Once | INTRAMUSCULAR | Status: AC
  Administered 2016-06-10: 30 mg via INTRAVENOUS
  Filled 2016-06-10: qty 1

## 2016-06-10 MED ORDER — IBUPROFEN 600 MG PO TABS: 600.0000 mg | ORAL_TABLET | Freq: Four times a day (QID) | ORAL | 0 refills | Status: DC | PRN

## 2016-06-10 NOTE — ED Provider Notes (Signed)
[facility] DEPT Provider Note   CSN: [id number] Arrival date & time: 06/10/16  2110     History   Chief Complaint Chief Complaint  Patient presents with  . Chest Pain    HPI Crystal Bullock is a 51 y.o. female.  HPI  51 y.o. female with a hx of Asthma, Anxiety, HTN, presents to the Emergency Department today via EMS complaining of chest pain this AM. Noted pain under left breast without radiation. States pain occurred after lifting one of her grandchildren. States pain is a sharp sensation underneath breast along lateral rib cage. No radiation of pain. No N/V. No abdominal pain. States pain has been intermittent but ongoing for the whole day. No trend with worsening with exertion or rest as it feels the same either way. Pt was transported via EMS and given Nitro and ASA without change in symptoms. No cardaic hx. No FH. No hx DVT/PE. No long distance travel. No surgeries. No other symptoms noted.   Past Medical History:  Diagnosis Date  . Anxiety 10/06/2015  . Asthma   . Atypical chest pain 10/06/2015  . Fibroid   . Hypertension   . Palpitations 10/06/2015  . RBBB 10/06/2015  . Sarcoidosis   . Scoliosis   . Seasonal allergies   . Sickle cell trait [facility])     Patient Active Problem List   Diagnosis Date Noted  . Sarcoidosis 10/06/2015  . Essential hypertension 10/06/2015  . Asthma 10/06/2015  . Atypical chest pain 10/06/2015  . Anxiety 10/06/2015  . Palpitations 10/06/2015  . RBBB 10/06/2015    Past Surgical History:  Procedure Laterality Date  . AXILLARY LYMPH NODE DISSECTION    . CHOLECYSTECTOMY      OB History    No data available       Home Medications    Prior to Admission medications   Medication Sig Start Date End Date Taking? Authorizing Provider  albuterol (PROVENTIL HFA;VENTOLIN HFA) 108 (90 BASE) MCG/ACT inhaler Inhale 2 puffs into the lungs every 6 (six) hours as needed for wheezing (wheezing).     Historical Provider, MD  albuterol  (PROVENTIL) (2.5 MG/3ML) 0.083% nebulizer solution Take 2.5 mg by nebulization every 6 (six) hours as needed for wheezing (wheezing).     Historical Provider, MD  amLODipine (NORVASC) 10 MG tablet Take 10 mg by mouth daily.    Historical Provider, MD  amoxicillin-clavulanate (AUGMENTIN) 875-125 MG tablet Take 1 tablet by mouth 2 (two) times daily as needed. 05/12/15   Historical Provider, MD  Ascorbic Acid (VITAMIN C) 1000 MG tablet Take 1,000 mg by mouth daily.    Historical Provider, MD  aspirin-sod bicarb-citric acid (ALKA-SELTZER) 325 MG TBEF tablet Take 325 mg by mouth every 6 (six) hours as needed (for congestion).    Historical Provider, MD  cetirizine (ZYRTEC) 10 MG tablet Take 10 mg by mouth daily as needed for allergies (allergies).     Historical Provider, MD  esomeprazole (NEXIUM) 40 MG capsule Take 1 capsule (40 mg total) by mouth daily as needed. 04/12/16   Skeet Latch, MD  fluticasone (FLONASE) 50 MCG/ACT nasal spray Place 2 sprays into both nostrils daily as needed. allergies 05/20/14   Historical Provider, MD  ibuprofen (ADVIL,MOTRIN) 800 MG tablet Take 1 tablet (800 mg total) by mouth 3 (three) times daily. 09/12/14   Billy Fischer, MD  LORazepam (ATIVAN) 1 MG tablet Take 1 mg by mouth daily as needed for anxiety.     Historical Provider, MD  metoprolol  succinate (TOPROL XL) 25 MG 24 hr tablet Take 1 tablet (25 mg total) by mouth daily. 10/06/15   Skeet Latch, MD  Multiple Vitamins-Minerals (MULTIVITAMINS THER. W/MINERALS) TABS Take 1 tablet by mouth daily.    Historical Provider, MD  oxyCODONE-acetaminophen (PERCOCET) 10-325 MG per tablet Take 1 tablet by mouth every 4 (four) hours as needed for pain. 01/12/13   Antonietta Breach, PA-C  promethazine (PHENERGAN) 25 MG tablet Take 25 mg by mouth every 6 (six) hours as needed for nausea or vomiting (nausea & vomiting).     Historical Provider, MD    Family History Family History  Problem Relation Age of Onset  . Hypertension Mother   .  Diabetes Mother     Social History Social History  Substance Use Topics  . Smoking status: Former Research scientist (life sciences)  . Smokeless tobacco: Never Used  . Alcohol use Yes     Comment: occasional      Allergies   Ivp dye [iodinated diagnostic agents]   Review of Systems Review of Systems ROS reviewed and all are negative for acute change except as noted in the HPI.  Physical Exam Updated Vital Signs BP (!) 172/90 (BP Location: Left Arm)   Pulse 65   Temp 98.8 F (37.1 C) (Oral)   SpO2 98%   Physical Exam  Constitutional: She is oriented to person, place, and time. Vital signs are normal. She appears well-developed and well-nourished. No distress.  HENT:  Head: Normocephalic and atraumatic.  Right Ear: Hearing, tympanic membrane, external ear and ear canal normal.  Left Ear: Hearing, tympanic membrane, external ear and ear canal normal.  Nose: Nose normal.  Mouth/Throat: Uvula is midline, oropharynx is clear and moist and mucous membranes are normal. No trismus in the jaw. No oropharyngeal exudate, posterior oropharyngeal erythema or tonsillar abscesses.  Eyes: Conjunctivae and EOM are normal. Pupils are equal, round, and reactive to light.  Neck: Normal range of motion. Neck supple. No tracheal deviation present.  Cardiovascular: Normal rate, regular rhythm, S1 normal, S2 normal, normal heart sounds, intact distal pulses and normal pulses.   Pulmonary/Chest: Effort normal and breath sounds normal. No respiratory distress. She has no decreased breath sounds. She has no wheezes. She has no rhonchi. She has no rales.  TTP left chest wall around ribs 9-10. No palpable or visible deformities.   Abdominal: Normal appearance and bowel sounds are normal. There is no tenderness.  Musculoskeletal: Normal range of motion.  Neurological: She is alert and oriented to person, place, and time.  Skin: Skin is warm and dry.  Psychiatric: She has a normal mood and affect. Her speech is normal and  behavior is normal. Thought content normal.     ED Treatments / Results  Labs (all labs ordered are listed, but only abnormal results are displayed) Labs Reviewed  BASIC METABOLIC PANEL - Abnormal; Notable for the following:       Result Value   Potassium 3.3 (*)    BUN 5 (*)    All other components within normal limits  CBC - Abnormal; Notable for the following:    Hemoglobin 11.5 (*)    HCT 33.3 (*)    MCV 68.8 (*)    MCH 23.8 (*)    RDW 17.1 (*)    All other components within normal limits  I-STAT TROPOININ, ED    EKG  EKG Interpretation  Date/Time:  Monday June 10 2016 21:16:44 EDT Ventricular Rate:  66 PR Interval:  180 QRS Duration: 150 QT  Interval:  480 QTC Calculation: 503 R Axis:   95 Text Interpretation:  Normal sinus rhythm Right bundle branch block Abnormal ECG since last tracing no significant change Confirmed by Eulis Foster  MD, ELLIOTT [phone]) on 06/10/2016 10:27:48 PM       Radiology Dg Chest 2 View  Result Date: 06/10/2016 CLINICAL DATA:  Chest pain, onset at 11:00 but worsening throughout today. EXAM: CHEST  2 VIEW COMPARISON:  08/16/2015 FINDINGS: The lungs are clear. The pulmonary vasculature is normal. Heart size is normal. Hilar and mediastinal contours are unremarkable. There is no pleural effusion. IMPRESSION: No active cardiopulmonary disease. Electronically Signed   By: Andreas Newport M.D.   On: 06/10/2016 22:10    Procedures Procedures (including critical care time)  Medications Ordered in ED Medications - No data to display   Initial Impression / Assessment and Plan / ED Course  I have reviewed the triage vital signs and the nursing notes.  Pertinent labs & imaging results that were available during my care of the patient were reviewed by me and considered in my medical decision making (see chart for details).  Final Clinical Impressions(s) / ED Diagnoses  {I have reviewed and evaluated the relevant laboratory values. {I have reviewed  and evaluated the relevant imaging studies. {I have interpreted the relevant EKG. {I have reviewed the relevant previous healthcare records. {I have reviewed EMS Documentation. {I obtained HPI from historian.   ED Course:  Assessment: Pt is a 51 y.o. female presents with CP this AM after lifting grandchild. Noted pain with palpation underneath left breast. No N/V. No hx CAD. No FH Has hx HTN. Given toradol in ED with improvement. Patient is to be discharged with recommendation to follow up with PCP in regards to today's hospital visit. Chest pain is not likely of cardiac or pulmonary etiology d/t presentation, perc negative, VSS, no tracheal deviation, no JVD or new murmur, RRR, breath sounds equal bilaterally, EKG without acute abnormalities, negative troponin, and negative CXR. Likely musculoskeletal in etiology. Area is TTP along lateral aspect of rib cage without palpable deformities and imaging negative. Pt has been advised start a NSAIDs and return to the ED is CP becomes exertional, associated with diaphoresis or nausea, radiates to left jaw/arm, worsens or becomes concerning in any way. Pt appears reliable for follow up and is agreeable to discharge. Patient is in no acute distress. Vital Signs are stable. Patient is able to ambulate. Patient able to tolerate PO.   Disposition/Plan:  DC Home Additional Verbal discharge instructions given and discussed with patient.  Pt Instructed to f/u with PCP in the next week for evaluation and treatment of symptoms. Return precautions given Pt acknowledges and agrees with plan  Supervising Physician Daleen Bo, MD  Final diagnoses:  Chest pain, unspecified type  Chest wall pain    New Prescriptions New Prescriptions   No medications on file     Shary Decamp, PA-C 06/10/16 [place], MD 06/13/16 [phone]

## 2016-06-10 NOTE — ED Triage Notes (Signed)
Patient coming for chest pain that began this morning.  Pain is under the left breast and does not radiate.  Was also nauseated and vomited some this morning.  Patient is A&Ox4 at this time.  EMS gave 1 nitro and 324 ASA.

## 2016-06-10 NOTE — Discharge Instructions (Signed)
Please read and follow all provided instructions.  Your diagnoses today include:  1. Chest pain, unspecified type   2. Chest wall pain     Tests performed today include: An EKG of your heart A chest x-ray Cardiac enzymes - a blood test for heart muscle damage Blood counts and electrolytes Vital signs. See below for your results today.   Medications prescribed:   Take any prescribed medications only as directed.  Follow-up instructions: Please follow-up with your primary care provider as soon as you can for further evaluation of your symptoms.   Return instructions:  SEEK IMMEDIATE MEDICAL ATTENTION IF: You have severe chest pain, especially if the pain is crushing or pressure-like and spreads to the arms, back, neck, or jaw, or if you have sweating, nausea (feeling sick to your stomach), or shortness of breath. THIS IS AN EMERGENCY. Don't wait to see if the pain will go away. Get medical help at once. Call 911 or 0 (operator). DO NOT drive yourself to the hospital.  Your chest pain gets worse and does not go away with rest.  You have an attack of chest pain lasting longer than usual, despite rest and treatment with the medications your caregiver has prescribed.  You wake from sleep with chest pain or shortness of breath. You feel dizzy or faint. You have chest pain not typical of your usual pain for which you originally saw your caregiver.  You have any other emergent concerns regarding your health.  Additional Information: Chest pain comes from many different causes. Your caregiver has diagnosed you as having chest pain that is not specific for one problem, but does not require admission.  You are at low risk for an acute heart condition or other serious illness.   Your vital signs today were: BP (!) 172/90 (BP Location: Left Arm)    Pulse 65    Temp 98.8 F (37.1 C) (Oral)    SpO2 98%  If your blood pressure (BP) was elevated above 135/85 this visit, please have this repeated  by your doctor within one month. --------------

## 2016-06-19 DIAGNOSIS — Z79891 Long term (current) use of opiate analgesic: Secondary | ICD-10-CM | POA: Diagnosis not present

## 2016-08-20 DIAGNOSIS — Z79891 Long term (current) use of opiate analgesic: Secondary | ICD-10-CM | POA: Diagnosis not present

## 2016-09-16 ENCOUNTER — Other Ambulatory Visit: Payer: Self-pay | Admitting: Cardiovascular Disease

## 2016-09-16 NOTE — Telephone Encounter (Signed)
REFILL 

## 2016-09-16 NOTE — Telephone Encounter (Signed)
Please review for refill. Thanks!  

## 2016-12-23 DIAGNOSIS — Z79891 Long term (current) use of opiate analgesic: Secondary | ICD-10-CM | POA: Diagnosis not present

## 2016-12-31 IMAGING — NM NM MISC PROCEDURE
6 series · 36 of 36 positions shown · non-contrast
Comparison: none

[Series 1: wbr_r-proj_st wbr rest · 6.40mm/px · 6 of 64 frames shown]
[frame 6/64]
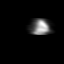
[frame 16/64]
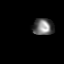
[frame 27/64]
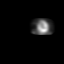
[frame 38/64]
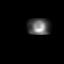
[frame 48/64]
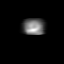
[frame 59/64]
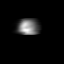

[Series 1: wbr rest · 6.40mm/px · 6 of 64 frames shown]
[frame 6/64]
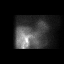
[frame 16/64]
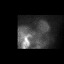
[frame 27/64]
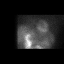
[frame 38/64]
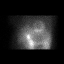
[frame 48/64]
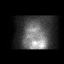
[frame 59/64]
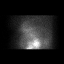

[Series 2: wbr stress-gsp · 6.40mm/px · 6 of 506 frames shown]
[frame 43/506  full-range]
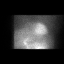
[frame 127/506  full-range]
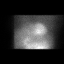
[frame 211/506  full-range]
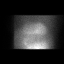
[frame 296/506  full-range]
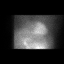
[frame 380/506  full-range]
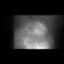
[frame 464/506  full-range]
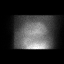

[Series 2: wbr_s-proj_st wbr stress-gsp · 6.40mm/px · 6 of 512 frames shown]
[frame 43/512]
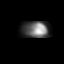
[frame 128/512]
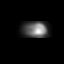
[frame 214/512]
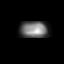
[frame 299/512]
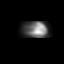
[frame 384/512]
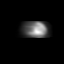
[frame 470/512]
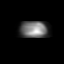

[Series 3: wbr_s-proj_st wbr stress-sum-em · 6.40mm/px · 6 of 64 frames shown]
[frame 6/64]
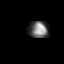
[frame 16/64]
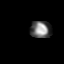
[frame 27/64]
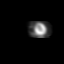
[frame 38/64]
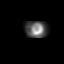
[frame 48/64]
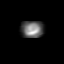
[frame 59/64]
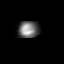

[Series 3: wbr stress-sum-em · 6.40mm/px · 6 of 64 frames shown]
[frame 6/64]
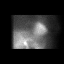
[frame 16/64]
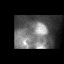
[frame 27/64]
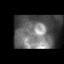
[frame 38/64]
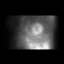
[frame 48/64]
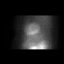
[frame 59/64]
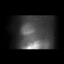

[36 of 36 positions shown; findings below may reference images not displayed]

Canned report from images found in remote index.

Refer to host system for actual result text.

## 2017-01-13 ENCOUNTER — Emergency Department (HOSPITAL_COMMUNITY): Payer: No Typology Code available for payment source

## 2017-01-13 ENCOUNTER — Emergency Department (HOSPITAL_COMMUNITY)
Admission: EM | Admit: 2017-01-13 | Discharge: 2017-01-13 | Disposition: A | Discharge status: Home / Self Care | Payer: No Typology Code available for payment source | Attending: Emergency Medicine | Admitting: Emergency Medicine

## 2017-01-13 ENCOUNTER — Encounter (HOSPITAL_COMMUNITY): Payer: Self-pay

## 2017-01-13 ENCOUNTER — Other Ambulatory Visit: Payer: Self-pay

## 2017-01-13 DIAGNOSIS — S92034A Nondisplaced avulsion fracture of tuberosity of right calcaneus, initial encounter for closed fracture: Secondary | ICD-10-CM | POA: Diagnosis not present

## 2017-01-13 DIAGNOSIS — Z9049 Acquired absence of other specified parts of digestive tract: Secondary | ICD-10-CM | POA: Diagnosis not present

## 2017-01-13 DIAGNOSIS — Y9389 Activity, other specified: Secondary | ICD-10-CM | POA: Diagnosis not present

## 2017-01-13 DIAGNOSIS — S8991XA Unspecified injury of right lower leg, initial encounter: Secondary | ICD-10-CM | POA: Diagnosis not present

## 2017-01-13 DIAGNOSIS — S79921A Unspecified injury of right thigh, initial encounter: Secondary | ICD-10-CM | POA: Diagnosis not present

## 2017-01-13 DIAGNOSIS — S99921A Unspecified injury of right foot, initial encounter: Secondary | ICD-10-CM | POA: Diagnosis not present

## 2017-01-13 DIAGNOSIS — Y92410 Unspecified street and highway as the place of occurrence of the external cause: Secondary | ICD-10-CM | POA: Insufficient documentation

## 2017-01-13 DIAGNOSIS — Y998 Other external cause status: Secondary | ICD-10-CM | POA: Insufficient documentation

## 2017-01-13 DIAGNOSIS — D573 Sickle-cell trait: Secondary | ICD-10-CM | POA: Insufficient documentation

## 2017-01-13 DIAGNOSIS — R102 Pelvic and perineal pain: Secondary | ICD-10-CM | POA: Diagnosis not present

## 2017-01-13 DIAGNOSIS — M79651 Pain in right thigh: Secondary | ICD-10-CM | POA: Diagnosis not present

## 2017-01-13 DIAGNOSIS — S299XXA Unspecified injury of thorax, initial encounter: Secondary | ICD-10-CM | POA: Diagnosis not present

## 2017-01-13 DIAGNOSIS — Z87891 Personal history of nicotine dependence: Secondary | ICD-10-CM | POA: Diagnosis not present

## 2017-01-13 DIAGNOSIS — Z79899 Other long term (current) drug therapy: Secondary | ICD-10-CM | POA: Insufficient documentation

## 2017-01-13 DIAGNOSIS — J45909 Unspecified asthma, uncomplicated: Secondary | ICD-10-CM | POA: Diagnosis not present

## 2017-01-13 DIAGNOSIS — M419 Scoliosis, unspecified: Secondary | ICD-10-CM | POA: Insufficient documentation

## 2017-01-13 DIAGNOSIS — M79661 Pain in right lower leg: Secondary | ICD-10-CM | POA: Diagnosis not present

## 2017-01-13 DIAGNOSIS — T148XXA Other injury of unspecified body region, initial encounter: Secondary | ICD-10-CM | POA: Diagnosis not present

## 2017-01-13 DIAGNOSIS — I10 Essential (primary) hypertension: Secondary | ICD-10-CM | POA: Insufficient documentation

## 2017-01-13 DIAGNOSIS — R079 Chest pain, unspecified: Secondary | ICD-10-CM | POA: Insufficient documentation

## 2017-01-13 DIAGNOSIS — S3992XA Unspecified injury of lower back, initial encounter: Secondary | ICD-10-CM | POA: Diagnosis not present

## 2017-01-13 DIAGNOSIS — S3993XA Unspecified injury of pelvis, initial encounter: Secondary | ICD-10-CM | POA: Diagnosis not present

## 2017-01-13 DIAGNOSIS — M5489 Other dorsalgia: Secondary | ICD-10-CM | POA: Diagnosis not present

## 2017-01-13 DIAGNOSIS — M545 Low back pain: Secondary | ICD-10-CM | POA: Diagnosis not present

## 2017-01-13 DIAGNOSIS — F419 Anxiety disorder, unspecified: Secondary | ICD-10-CM | POA: Diagnosis not present

## 2017-01-13 DIAGNOSIS — M546 Pain in thoracic spine: Secondary | ICD-10-CM | POA: Diagnosis not present

## 2017-01-13 DIAGNOSIS — M79671 Pain in right foot: Secondary | ICD-10-CM | POA: Diagnosis not present

## 2017-01-13 LAB — POC URINE PREG, ED: Preg Test, Ur: NEGATIVE

## 2017-01-13 MED ORDER — NAPROXEN 500 MG PO TABS: 500.0000 mg | ORAL_TABLET | Freq: Two times a day (BID) | ORAL | 0 refills | Status: DC

## 2017-01-13 MED ORDER — PROMETHAZINE HCL 25 MG PO TABS
12.5000 mg | ORAL_TABLET | Freq: Once | ORAL | Status: AC
  Administered 2017-01-13: 12.5 mg via ORAL
  Filled 2017-01-13: qty 1

## 2017-01-13 MED ORDER — METHOCARBAMOL 500 MG PO TABS: 500.0000 mg | ORAL_TABLET | Freq: Two times a day (BID) | ORAL | 0 refills | Status: DC

## 2017-01-13 MED ORDER — OXYCODONE-ACETAMINOPHEN 5-325 MG PO TABS
1.0000 | ORAL_TABLET | Freq: Once | ORAL | Status: AC
  Administered 2017-01-13: 1 via ORAL
  Filled 2017-01-13: qty 1
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
## 2017-01-13 NOTE — Discharge Instructions (Signed)
Please read and follow all provided instructions.  You were diagnosed with a fracture of your right calcaneus. You were placed in a walking boot, please wear this whenever walking around. You will need to follow up with orthopedics as below.    Tests performed today include: X-ray of your chest, back, hip, pelvis, leg, and foot. All x-rays were normal with the exception of the x-ray of your right foot this showed a fracture. The report is listed below.     IMPRESSION:  1. Small avulsion fracture suspected from the anterolateral  calcaneus, such as due to avulsion of the extensor digitorum brevis.  2. No other acute fracture or dislocation identified about the right  foot.      Medications prescribed:     Naproxen is a nonsteroidal anti-inflammatory medication that will help with pain and swelling. Be sure to take this medication as prescribed with food, 1 pill every 12 hours,  It should be taken with food, as it can cause stomach upset, and more seriously, stomach bleeding. Do not take other nonsteroidal anti-inflammatory medications with this such as Advil, Motrin, or Aleve.   Robaxin is the muscle relaxer I have prescribed, this is meant to help with muscle tightness. Be aware that this medication may make you drowsy therefore the first time you take this it should be at a time you are in an environment where you can rest. Do not drive or operate heavy machinery when taking this medication.   In addition you may also take Tylenol. Tylenol is generally safe, though you should not take more than 8 of the extra strength (500mg ) pills a day.   Follow-up instructions: You will need to follow-up with the orthopedic provider that I have included in your discharge instructions in the next 3-5 days.  You will need to call to make an appointment.  Return instructions:  Please return to the Emergency Department if you experience worsening symptoms.  You have numbness, tingling, or weakness in  the arms or legs.  You develop severe headaches not relieved with medicine.  You have severe neck pain, especially tenderness in the middle of the back of your neck.  You have vision or hearing changes If you develop confusion You have changes in bowel or bladder control.  There is increasing pain in any area of the body.  You have shortness of breath, lightheadedness, dizziness, or fainting.  You have chest pain.  You feel sick to your stomach (nauseous), or throw up (vomit).  You have increasing abdominal discomfort.  There is blood in your urine, stool, or vomit.  You have pain in your shoulder (shoulder strap areas).  You feel your symptoms are getting worse or if you have any other emergent concerns  Additional Information:  Your vital signs today were: Vitals:   01/13/17 1655 01/13/17 1952  BP: (!) 182/103 (!) 157/103  Pulse: 90 74  Resp: 18   SpO2: 100% 98%     If your blood pressure (BP) was elevated above 135/85 this visit, please have this repeated by your doctor within one month -----------------------------------------------------
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
## 2017-01-13 NOTE — ED Notes (Signed)
ED Provider at bedside. 

## 2017-01-13 NOTE — ED Notes (Signed)
Patient transported to X-ray 

## 2017-01-15 ENCOUNTER — Ambulatory Visit (INDEPENDENT_AMBULATORY_CARE_PROVIDER_SITE_OTHER): Payer: Medicare Other | Admitting: Orthopaedic Surgery

## 2017-01-15 ENCOUNTER — Encounter (INDEPENDENT_AMBULATORY_CARE_PROVIDER_SITE_OTHER): Payer: Self-pay | Admitting: Orthopaedic Surgery

## 2017-01-15 ENCOUNTER — Ambulatory Visit (INDEPENDENT_AMBULATORY_CARE_PROVIDER_SITE_OTHER): Payer: Self-pay

## 2017-01-15 VITALS — BP 151/101 | HR 69 | Ht 67.0 in | Wt 235.0 lb

## 2017-01-15 DIAGNOSIS — M545 Low back pain: Secondary | ICD-10-CM

## 2017-01-15 DIAGNOSIS — M79671 Pain in right foot: Secondary | ICD-10-CM

## 2017-01-15 DIAGNOSIS — S93401A Sprain of unspecified ligament of right ankle, initial encounter: Secondary | ICD-10-CM

## 2017-01-15 MED ORDER — METHYLPREDNISOLONE 4 MG PO TABS: ORAL_TABLET | ORAL | 0 refills | Status: DC
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
## 2017-01-29 ENCOUNTER — Ambulatory Visit (INDEPENDENT_AMBULATORY_CARE_PROVIDER_SITE_OTHER): Payer: Self-pay | Admitting: Orthopaedic Surgery

## 2017-02-14 ENCOUNTER — Ambulatory Visit (INDEPENDENT_AMBULATORY_CARE_PROVIDER_SITE_OTHER): Payer: Medicare Other | Admitting: Orthopaedic Surgery

## 2017-02-14 ENCOUNTER — Encounter (INDEPENDENT_AMBULATORY_CARE_PROVIDER_SITE_OTHER): Payer: Self-pay | Admitting: Orthopaedic Surgery

## 2017-02-14 VITALS — BP 152/89 | HR 101 | Ht 67.5 in | Wt 237.0 lb

## 2017-02-14 DIAGNOSIS — M545 Low back pain: Secondary | ICD-10-CM

## 2017-02-14 DIAGNOSIS — S93412D Sprain of calcaneofibular ligament of left ankle, subsequent encounter: Secondary | ICD-10-CM

## 2017-02-14 DIAGNOSIS — M25572 Pain in left ankle and joints of left foot: Secondary | ICD-10-CM | POA: Diagnosis not present
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
## 2017-02-24 ENCOUNTER — Encounter (INDEPENDENT_AMBULATORY_CARE_PROVIDER_SITE_OTHER): Payer: Self-pay | Admitting: Orthopaedic Surgery

## 2017-02-24 DIAGNOSIS — M545 Low back pain, unspecified: Secondary | ICD-10-CM | POA: Insufficient documentation

## 2017-03-25 DIAGNOSIS — Z79891 Long term (current) use of opiate analgesic: Secondary | ICD-10-CM | POA: Diagnosis not present

## 2017-04-14 ENCOUNTER — Telehealth (INDEPENDENT_AMBULATORY_CARE_PROVIDER_SITE_OTHER): Payer: Self-pay | Admitting: Orthopaedic Surgery

## 2017-04-14 NOTE — Telephone Encounter (Signed)
Returned call to patient left message for call back [phone]

## 2017-04-15 ENCOUNTER — Ambulatory Visit (INDEPENDENT_AMBULATORY_CARE_PROVIDER_SITE_OTHER): Payer: Medicare Other | Admitting: Orthopaedic Surgery

## 2017-04-24 ENCOUNTER — Ambulatory Visit: Payer: Medicare Other | Admitting: Family Medicine

## 2017-04-24 ENCOUNTER — Encounter: Payer: Self-pay | Admitting: Family Medicine

## 2017-04-24 ENCOUNTER — Ambulatory Visit (INDEPENDENT_AMBULATORY_CARE_PROVIDER_SITE_OTHER): Payer: Medicare Other | Admitting: Family Medicine

## 2017-04-24 VITALS — BP 168/104 | HR 76 | Temp 98.1°F | Resp 16 | Ht 67.5 in | Wt 238.0 lb

## 2017-04-24 DIAGNOSIS — M5441 Lumbago with sciatica, right side: Secondary | ICD-10-CM | POA: Diagnosis not present

## 2017-04-24 DIAGNOSIS — R5383 Other fatigue: Secondary | ICD-10-CM | POA: Diagnosis not present

## 2017-04-24 DIAGNOSIS — M5442 Lumbago with sciatica, left side: Secondary | ICD-10-CM | POA: Diagnosis not present

## 2017-04-24 DIAGNOSIS — D869 Sarcoidosis, unspecified: Secondary | ICD-10-CM

## 2017-04-24 DIAGNOSIS — G8929 Other chronic pain: Secondary | ICD-10-CM

## 2017-04-24 DIAGNOSIS — L732 Hidradenitis suppurativa: Secondary | ICD-10-CM | POA: Diagnosis not present

## 2017-04-24 DIAGNOSIS — F411 Generalized anxiety disorder: Secondary | ICD-10-CM

## 2017-04-24 DIAGNOSIS — R739 Hyperglycemia, unspecified: Secondary | ICD-10-CM | POA: Diagnosis not present

## 2017-04-24 DIAGNOSIS — I10 Essential (primary) hypertension: Secondary | ICD-10-CM

## 2017-04-24 LAB — POCT GLYCOSYLATED HEMOGLOBIN (HGB A1C): Hemoglobin A1C: 6.1

## 2017-04-24 MED ORDER — BUSPIRONE HCL 5 MG PO TABS: 5.0000 mg | ORAL_TABLET | Freq: Two times a day (BID) | ORAL | 1 refills | Status: DC

## 2017-04-24 MED ORDER — AMLODIPINE BESYLATE 10 MG PO TABS: 10.0000 mg | ORAL_TABLET | Freq: Every day | ORAL | 1 refills | Status: DC

## 2017-04-24 MED ORDER — ALBUTEROL SULFATE HFA 108 (90 BASE) MCG/ACT IN AERS: 2.0000 | INHALATION_SPRAY | Freq: Four times a day (QID) | RESPIRATORY_TRACT | 5 refills | Status: DC | PRN

## 2017-04-24 NOTE — Progress Notes (Signed)
Chief Complaint  Patient presents with  . Hypertension  . Asthma  . Establish Care     Subjective:    Patient ID: Crystal Bullock, female    DOB: 1965-08-14, 52 y.o.   MRN: [id number]  HPI A 52 year old female with a history of hypertension, chronic back pain, generalized anxiety disorder. sarcoidosis, and essential hypertension who presents to establish care.  She was a patient of Dr. Earley Abide prior to establishing.  Patient states that she is not been taking antihypertensive medications consistently.  She has been out of medications for the past 2 weeks.  She reports that she recently started a low-fat diet and is watching her calories.  She is also stopped eating fast food and is limiting daily sodas.Body mass index is 36.73 kg/m.  She states that it has been difficult to exercise due to sarcoidosis.  At times.  She experiences shortness of breath with with exertion.  Patient also has a history of mild intermittent asthma.  She is not experienced an asthma exacerbation requiring hospitalization over the past several years.  Patient typically uses albuterol when symptoms of asthma or other.  She is not awakened with asthma symptoms.    Patient also has a history hidradenitis suppurativa, she has been taking Augmentin 875-125 mg daily over the past several years.  Patient has had several surgeries to remove tracks for hidradenitis.  She states that she has attempted doxycycline in the past without sustained relief.   Patient is also complaining of chronic back pain.  She says the pain is primarily in the lower back.  It is aggravated by prolonged sitting, standing, and lying down.  Patient typically takes Percocet 10/325 mg every 6 hours as needed for moderate-to-severe pain with maximal relief.  She says that she has been out of medications over the past several weeks and is requesting a prescription today.  Current pain intensity is 7/10 characterized as intermittent aching.  She has  not attempted any over-the-counter analgesics to alleviate current symptoms.  Patient also has a history of generalized anxiety disorder.  She describes symptoms of racing thoughts.  Symptoms of anxiety have been controlled on Lorazepam twice daily over the past several years.  Patient currently denies any suicidal or homicidal ideations.  She has not been trialed on any other medications to alleviate symptoms of anxiety and is not currently under the care of psychiatry. Past Medical History:  Diagnosis Date  . Anxiety 10/06/2015  . Asthma   . Atypical chest pain 10/06/2015  . Fibroid   . Hypertension   . Palpitations 10/06/2015  . RBBB 10/06/2015  . Sarcoidosis   . Scoliosis   . Seasonal allergies   . Sickle cell trait (Startex)    Social History   Socioeconomic History  . Marital status: Single    Spouse name: Not on file  . Number of children: Not on file  . Years of education: Not on file  . Highest education level: Not on file  Social Needs  . Financial resource strain: Not on file  . Food insecurity - worry: Not on file  . Food insecurity - inability: Not on file  . Transportation needs - medical: Not on file  . Transportation needs - non-medical: Not on file  Occupational History  . Not on file  Tobacco Use  . Smoking status: Former Research scientist (life sciences)  . Smokeless tobacco: Never Used  Substance and Sexual Activity  . Alcohol use: Yes    Comment: occasional   .  Drug use: No  . Sexual activity: Yes    Birth control/protection: Condom  Other Topics Concern  . Not on file  Social History Narrative  . Not on file    There is no immunization history on file for this patient. GAD 7 : Generalized Anxiety Score 04/24/2017  Nervous, Anxious, on Edge 3  Control/stop worrying 2  Worry too much - different things 2  Trouble relaxing 3  Restless 3  Easily annoyed or irritable 3  Afraid - awful might happen 3  Total GAD 7 Score 19    Depression screen PHQ 2/9 04/24/2017  Decreased  Interest 0  Down, Depressed, Hopeless 1  PHQ - 2 Score 1    Review of Systems  Constitutional: Positive for fatigue.  HENT: Negative.   Respiratory: Negative.  Negative for chest tightness and shortness of breath.   Cardiovascular: Negative.  Negative for chest pain.  Gastrointestinal: Negative.   Endocrine: Negative for polydipsia, polyphagia and polyuria.  Genitourinary: Negative.  Negative for difficulty urinating and dyspareunia.  Musculoskeletal: Positive for back pain.  Skin:       History of hidradenitis  Neurological: Negative.  Negative for numbness.  Hematological: Negative.   Psychiatric/Behavioral: The patient is nervous/anxious.        Objective:   Physical Exam  Constitutional: She is oriented to person, place, and time. She appears well-developed.  HENT:  Head: Normocephalic and atraumatic.  Right Ear: External ear normal.  Left Ear: External ear normal.  Nose: Nose normal.  Mouth/Throat: Oropharynx is clear and moist.  Eyes: Pupils are equal, round, and reactive to light.  Neck: Normal range of motion. Neck supple.  Cardiovascular: Normal rate, regular rhythm, normal heart sounds and intact distal pulses.  Pulmonary/Chest: Effort normal and breath sounds normal.  Abdominal: Bowel sounds are normal.  Musculoskeletal:       Lumbar back: She exhibits decreased range of motion and pain.  Neurological: She is alert and oriented to person, place, and time. She has normal reflexes.  Psychiatric: Her behavior is normal. Judgment normal. Cognition and memory are normal.         BP (!) 168/104 (BP Location: Left Arm, Patient Position: Sitting, Cuff Size: Large) Comment: manually  Pulse 76   Temp 98.1 F (36.7 C) (Oral)   Resp 16   Ht 5' 7.5" (1.715 m)   Wt 238 lb (108 kg)   SpO2 100%   BMI 36.73 kg/m  Assessment & Plan:  1. Essential hypertension Blood pressure is above goal.  Goal is less than 140/90.  Will restart amlodipine at 10 mg daily.  Will  recheck blood pressure in 2 weeks. - amLODipine (NORVASC) 10 MG tablet; Take 1 tablet (10 mg total) by mouth daily.  Dispense: 90 tablet; Refill: 1 - Comprehensive metabolic panel - Ambulatory referral to Ophthalmology  2. Hidradenitis suppurativa Patient has been taking Augmentin 875-125 mg over the past several years.  She warrants a referral to dermatology for further evaluation. - Ambulatory referral to Dermatology  3. Chronic bilateral low back pain with bilateral sciatica Patient had been prescribed Percocet 10-325 mg for chronic back pain.  Informed patient that chronic pain will not be treated with opiates and primary care, she wants a referral to pain clinic. Will start a trial of gabapentin to alleviate back pain - Ambulatory referral to Pain Clinic  4. Sarcoidosis Patient has a remote history of sarcoidosis.  She wants a referral to pulmonology for consult.  Will defer to pulmonology  for further management of this condition. - Ambulatory referral to Pulmonology - albuterol (PROVENTIL HFA;VENTOLIN HFA) 108 (90 Base) MCG/ACT inhaler; Inhale 2 puffs into the lungs every 6 (six) hours as needed for wheezing (wheezing).  Dispense: 1 each; Refill: 5  5. Generalized anxiety disorder Patient was previously treated with Ativan 1 mg twice daily for generalized anxiety disorder.  We will not treat anxiety with benzodiazepine.  Will start a trial of BuSpar 5 mg twice daily and send a referral to psychology for further evaluation. - busPIRone (BUSPAR) 5 MG tablet; Take 1 tablet (5 mg total) by mouth 2 (two) times daily.  Dispense: 60 tablet; Refill: 1 - Ambulatory referral to Psychology  6. Fatigue, unspecified type - TSH - CBC with Differential  7. Hyperglycemia Hemoglobin A1c is 6.1, which is consistent with prediabetes.  Recommend a low-fat, carbohydrate modified diet divided over 5-6 small meals throughout the day.  Also, increase water intake and daily activities.  BMI is currently  36, recommend BMI less than 25. - HgB A1c    Health maintenance: Patient will warrant screening colonoscopy in August, will schedule at that time. Patient warrants Pap smear, will return in 3 months for Pap smear Patient is up-to-date with vaccinations   RTC: 1 month for hypertension    Donia Pounds  MSN, FNP-C [facility] [address] [place], [place] [address] [phone]

## 2017-04-24 NOTE — Patient Instructions (Addendum)
Thanks for establishing care.   Prediabetes Eating Plan Prediabetes-also called impaired glucose tolerance or impaired fasting glucose-is a condition that causes blood sugar (blood glucose) levels to be higher than normal. Following a healthy diet can help to keep prediabetes under control. It can also help to lower the risk of type 2 diabetes and heart disease, which are increased in people who have prediabetes. Along with regular exercise, a healthy diet:  Promotes weight loss.  Helps to control blood sugar levels.  Helps to improve the way that the body uses insulin.  What do I need to know about this eating plan?  Use the glycemic index (GI) to plan your meals. The index tells you how quickly a food will raise your blood sugar. Choose low-GI foods. These foods take a longer time to raise blood sugar.  Pay close attention to the amount of carbohydrates in the food that you eat. Carbohydrates increase blood sugar levels.  Keep track of how many calories you take in. Eating the right amount of calories will help you to achieve a healthy weight. Losing about 7 percent of your starting weight can help to prevent type 2 diabetes.  You may want to follow a Mediterranean diet. This diet includes a lot of vegetables, lean meats or fish, whole grains, fruits, and healthy oils and fats. What foods can I eat? Grains Whole grains, such as whole-wheat or whole-grain breads, crackers, cereals, and pasta. Unsweetened oatmeal. Bulgur. Barley. Quinoa. Brown rice. Corn or whole-wheat flour tortillas or taco shells. Vegetables Lettuce. Spinach. Peas. Beets. Cauliflower. Cabbage. Broccoli. Carrots. Tomatoes. Squash. Eggplant. Herbs. Peppers. Onions. Cucumbers. Brussels sprouts. Fruits Berries. Bananas. Apples. Oranges. Grapes. Papaya. Mango. Pomegranate. Kiwi. Grapefruit. Cherries. Meats and Other Protein Sources Seafood. Lean meats, such as chicken and [place] or lean cuts of pork and beef. Tofu. Eggs.  Nuts. Beans. Dairy Low-fat or fat-free dairy products, such as yogurt, cottage cheese, and cheese. Beverages Water. Tea. Coffee. Sugar-free or diet soda. Seltzer water. Milk. Milk alternatives, such as soy or almond milk. Condiments Mustard. Relish. Low-fat, low-sugar ketchup. Low-fat, low-sugar barbecue sauce. Low-fat or fat-free mayonnaise. Sweets and Desserts Sugar-free or low-fat pudding. Sugar-free or low-fat ice cream and other frozen treats. Fats and Oils Avocado. Walnuts. Olive oil. The items listed above may not be a complete list of recommended foods or beverages. Contact your dietitian for more options. What foods are not recommended? Grains Refined white flour and flour products, such as bread, pasta, snack foods, and cereals. Beverages Sweetened drinks, such as sweet iced tea and soda. Sweets and Desserts Baked goods, such as cake, cupcakes, pastries, cookies, and cheesecake. The items listed above may not be a complete list of foods and beverages to avoid. Contact your dietitian for more information. This information is not intended to replace advice given to you by your health care provider. Make sure you discuss any questions you have with your health care provider. Document Released: 06/14/2014 Document Revised: 07/06/2015 Document Reviewed: 02/23/2014 Elsevier Interactive Patient Education  2017 [place] DASH stands for "Dietary Approaches to Stop Hypertension." The DASH eating plan is a healthy eating plan that has been shown to reduce high blood pressure (hypertension). It may also reduce your risk for type 2 diabetes, heart disease, and stroke. The DASH eating plan may also help with weight loss. What are tips for following this plan? General guidelines  Avoid eating more than 2,300 mg (milligrams) of salt (sodium) a day. If you have hypertension, you  may need to reduce your sodium intake to 1,500 mg a day.  Limit alcohol intake to no more  than 1 drink a day for nonpregnant women and 2 drinks a day for men. One drink equals 12 oz of beer, 5 oz of wine, or 1 oz of hard liquor.  Work with your health care provider to maintain a healthy body weight or to lose weight. Ask what an ideal weight is for you.  Get at least 30 minutes of exercise that causes your heart to beat faster (aerobic exercise) most days of the week. Activities may include walking, swimming, or biking.  Work with your health care provider or diet and nutrition specialist (dietitian) to adjust your eating plan to your individual calorie needs. Reading food labels  Check food labels for the amount of sodium per serving. Choose foods with less than 5 percent of the Daily Value of sodium. Generally, foods with less than 300 mg of sodium per serving fit into this eating plan.  To find whole grains, look for the word "whole" as the first word in the ingredient list. Shopping  Buy products labeled as "low-sodium" or "no salt added."  Buy fresh foods. Avoid canned foods and premade or frozen meals. Cooking  Avoid adding salt when cooking. Use salt-free seasonings or herbs instead of table salt or sea salt. Check with your health care provider or pharmacist before using salt substitutes.  Do not fry foods. Cook foods using healthy methods such as baking, boiling, grilling, and broiling instead.  Cook with heart-healthy oils, such as olive, canola, soybean, or sunflower oil. Meal planning   Eat a balanced diet that includes: ? 5 or more servings of fruits and vegetables each day. At each meal, try to fill half of your plate with fruits and vegetables. ? Up to 6-8 servings of whole grains each day. ? Less than 6 oz of lean meat, poultry, or fish each day. A 3-oz serving of meat is about the same size as a deck of cards. One egg equals 1 oz. ? 2 servings of low-fat dairy each day. ? A serving of nuts, seeds, or beans 5 times each week. ? Heart-healthy fats. Healthy  fats called Omega-3 fatty acids are found in foods such as flaxseeds and coldwater fish, like sardines, salmon, and mackerel.  Limit how much you eat of the following: ? Canned or prepackaged foods. ? Food that is high in trans fat, such as fried foods. ? Food that is high in saturated fat, such as fatty meat. ? Sweets, desserts, sugary drinks, and other foods with added sugar. ? Full-fat dairy products.  Do not salt foods before eating.  Try to eat at least 2 vegetarian meals each week.  Eat more home-cooked food and less restaurant, buffet, and fast food.  When eating at a restaurant, ask that your food be prepared with less salt or no salt, if possible. What foods are recommended? The items listed may not be a complete list. Talk with your dietitian about what dietary choices are best for you. Grains Whole-grain or whole-wheat bread. Whole-grain or whole-wheat pasta. Brown rice. [place]. Bulgur. Whole-grain and low-sodium cereals. Pita bread. Low-fat, low-sodium crackers. Whole-wheat flour tortillas. Vegetables Fresh or frozen vegetables (raw, steamed, roasted, or grilled). Low-sodium or reduced-sodium tomato and vegetable juice. Low-sodium or reduced-sodium tomato sauce and tomato paste. Low-sodium or reduced-sodium canned vegetables. Fruits All fresh, dried, or frozen fruit. Canned fruit in natural juice (without added sugar). Meat and other  protein foods Skinless chicken or [place]. Ground chicken or [place]. Pork with fat trimmed off. Fish and seafood. Egg whites. Dried beans, peas, or lentils. Unsalted nuts, nut butters, and seeds. Unsalted canned beans. Lean cuts of beef with fat trimmed off. Low-sodium, lean deli meat. Dairy Low-fat (1%) or fat-free (skim) milk. Fat-free, low-fat, or reduced-fat cheeses. Nonfat, low-sodium ricotta or cottage cheese. Low-fat or nonfat yogurt. Low-fat, low-sodium cheese. Fats and oils Soft margarine without trans fats. Vegetable oil.  Low-fat, reduced-fat, or light mayonnaise and salad dressings (reduced-sodium). Canola, safflower, olive, soybean, and sunflower oils. Avocado. Seasoning and other foods Herbs. Spices. Seasoning mixes without salt. Unsalted popcorn and pretzels. Fat-free sweets. What foods are not recommended? The items listed may not be a complete list. Talk with your dietitian about what dietary choices are best for you. Grains Baked goods made with fat, such as croissants, muffins, or some breads. Dry pasta or rice meal packs. Vegetables Creamed or fried vegetables. Vegetables in a cheese sauce. Regular canned vegetables (not low-sodium or reduced-sodium). Regular canned tomato sauce and paste (not low-sodium or reduced-sodium). Regular tomato and vegetable juice (not low-sodium or reduced-sodium). Angie Fava. Olives. Fruits Canned fruit in a light or heavy syrup. Fried fruit. Fruit in cream or butter sauce. Meat and other protein foods Fatty cuts of meat. Ribs. Fried meat. Berniece Salines. Sausage. Bologna and other processed lunch meats. Salami. Fatback. Hotdogs. Bratwurst. Salted nuts and seeds. Canned beans with added salt. Canned or smoked fish. Whole eggs or egg yolks. Chicken or [place] with skin. Dairy Whole or 2% milk, cream, and half-and-half. Whole or full-fat cream cheese. Whole-fat or sweetened yogurt. Full-fat cheese. Nondairy creamers. Whipped toppings. Processed cheese and cheese spreads. Fats and oils Butter. Stick margarine. Lard. Shortening. Ghee. Bacon fat. Tropical oils, such as coconut, palm kernel, or palm oil. Seasoning and other foods Salted popcorn and pretzels. Onion salt, garlic salt, seasoned salt, table salt, and sea salt. Worcestershire sauce. Tartar sauce. Barbecue sauce. Teriyaki sauce. Soy sauce, including reduced-sodium. Steak sauce. Canned and packaged gravies. Fish sauce. Oyster sauce. Cocktail sauce. Horseradish that you find on the shelf. Ketchup. Mustard. Meat flavorings and  tenderizers. Bouillon cubes. Hot sauce and Tabasco sauce. Premade or packaged marinades. Premade or packaged taco seasonings. Relishes. Regular salad dressings. Where to find more information:  National Heart, Lung, and [place]: https://wilson-eaton.com/  American Heart Association: www.heart.org Summary  The DASH eating plan is a healthy eating plan that has been shown to reduce high blood pressure (hypertension). It may also reduce your risk for type 2 diabetes, heart disease, and stroke.  With the DASH eating plan, you should limit salt (sodium) intake to 2,300 mg a day. If you have hypertension, you may need to reduce your sodium intake to 1,500 mg a day.  When on the DASH eating plan, aim to eat more fresh fruits and vegetables, whole grains, lean proteins, low-fat dairy, and heart-healthy fats.  Work with your health care provider or diet and nutrition specialist (dietitian) to adjust your eating plan to your individual calorie needs. This information is not intended to replace advice given to you by your health care provider. Make sure you discuss any questions you have with your health care provider. Document Released: 01/17/2011 Document Revised: 01/22/2016 Document Reviewed: 01/22/2016 Elsevier Interactive Patient Education  [employer].

## 2017-04-25 LAB — COMPREHENSIVE METABOLIC PANEL
ALT: 23 IU/L (ref 0–32)
AST: 28 IU/L (ref 0–40)
Albumin/Globulin Ratio: 1.2 (ref 1.2–2.2)
Albumin: 4.2 g/dL (ref 3.5–5.5)
Alkaline Phosphatase: 63 IU/L (ref 39–117)
BUN/Creatinine Ratio: 11 (ref 9–23)
BUN: 12 mg/dL (ref 6–24)
Bilirubin Total: <0.2 mg/dL (ref 0.0–1.2)
CO2: 27 mmol/L (ref 20–29)
Calcium: 9.8 mg/dL (ref 8.7–10.2)
Chloride: 101 mmol/L (ref 96–106)
Creatinine, Ser: 1.12 mg/dL — ABNORMAL HIGH (ref 0.57–1.00)
GFR calc Af Amer: 67 mL/min/{1.73_m2} (ref >59)
GFR calc non Af Amer: 58 mL/min/{1.73_m2} — ABNORMAL LOW (ref >59)
Globulin, Total: 3.4 g/dL (ref 1.5–4.5)
Glucose: 86 mg/dL (ref 65–99)
Potassium: 3.9 mmol/L (ref 3.5–5.2)
Sodium: 140 mmol/L (ref 134–144)
Total Protein: 7.6 g/dL (ref 6.0–8.5)

## 2017-04-25 LAB — CBC WITH DIFFERENTIAL/PLATELET
Basophils Absolute: 0 10*3/uL (ref 0.0–0.2)
Basos: 0 %
EOS (ABSOLUTE): 0.3 10*3/uL (ref 0.0–0.4)
Eos: 4 %
Hematocrit: 38.4 % (ref 34.0–46.6)
Hemoglobin: 12.9 g/dL (ref 11.1–15.9)
Immature Grans (Abs): 0 10*3/uL (ref 0.0–0.1)
Immature Granulocytes: 0 %
Lymphocytes Absolute: 2.7 10*3/uL (ref 0.7–3.1)
Lymphs: 34 %
MCH: 24.5 pg — ABNORMAL LOW (ref 26.6–33.0)
MCHC: 33.6 g/dL (ref 31.5–35.7)
MCV: 73 fL — ABNORMAL LOW (ref 79–97)
Monocytes Absolute: 0.5 10*3/uL (ref 0.1–0.9)
Monocytes: 7 %
Neutrophils Absolute: 4.3 10*3/uL (ref 1.4–7.0)
Neutrophils: 55 %
Platelets: 292 10*3/uL (ref 150–379)
RBC: 5.26 x10E6/uL (ref 3.77–5.28)
RDW: 18.4 % — ABNORMAL HIGH (ref 12.3–15.4)
WBC: 7.9 10*3/uL (ref 3.4–10.8)

## 2017-04-25 LAB — TSH: TSH: 0.7 u[IU]/mL (ref 0.450–4.500)

## 2017-04-28 ENCOUNTER — Telehealth: Payer: Self-pay

## 2017-04-28 NOTE — Telephone Encounter (Signed)
-----   Message from Dorena Dew, [place] sent at 04/26/2017 11:52 AM EDT ----- Regarding: lab results Please inform patient that creatinine level (an indicator of kidney functioning) was mildly elevated. Remind her to take all antihypertensive medications consistently. Will recheck creatinine level in 3 months. Recommend a lowfat, low carbohydrate diet divided over 5-6 small meals, increase water intake to 6-8 glasses, and 150 minutes per week of cardiovascular exercise.    Donia Pounds  MSN, FNP-C [facility] [address] [place], [place] [address] [phone]

## 2017-04-28 NOTE — Telephone Encounter (Signed)
Called, no answer and left a message for patient to call back. Thanks!

## 2017-04-30 NOTE — Telephone Encounter (Signed)
Called and spoke with patient, advised that creatinine was elevated and that we will recheck in 3 months. Advised that she need to take bp meds consistently. Recommended that she eat low carb/ low fat diet over 5 to 6 small meals daily, asked that she drink 6 to 8 glasses of water daily and exercise 150 minutes weekly of cardio. Patient verbalized understanding. Thanks!

## 2017-05-06 ENCOUNTER — Institutional Professional Consult (permissible substitution): Payer: Medicare Other | Admitting: Pulmonary Disease

## 2017-05-07 DIAGNOSIS — M545 Low back pain: Secondary | ICD-10-CM | POA: Diagnosis not present

## 2017-05-07 DIAGNOSIS — Z79899 Other long term (current) drug therapy: Secondary | ICD-10-CM | POA: Diagnosis not present

## 2017-05-09 ENCOUNTER — Other Ambulatory Visit: Payer: Self-pay | Admitting: Nurse Practitioner

## 2017-05-09 DIAGNOSIS — Z1231 Encounter for screening mammogram for malignant neoplasm of breast: Secondary | ICD-10-CM

## 2017-05-09 DIAGNOSIS — J45901 Unspecified asthma with (acute) exacerbation: Secondary | ICD-10-CM | POA: Diagnosis not present

## 2017-05-12 ENCOUNTER — Ambulatory Visit
Admission: RE | Admit: 2017-05-12 | Discharge: 2017-05-12 | Disposition: A | Discharge status: Home / Self Care | Payer: Medicare Other | Source: Ambulatory Visit | Attending: Nurse Practitioner | Admitting: Nurse Practitioner

## 2017-05-12 ENCOUNTER — Telehealth (INDEPENDENT_AMBULATORY_CARE_PROVIDER_SITE_OTHER): Payer: Self-pay | Admitting: Orthopaedic Surgery

## 2017-05-12 DIAGNOSIS — Z1231 Encounter for screening mammogram for malignant neoplasm of breast: Secondary | ICD-10-CM | POA: Diagnosis not present
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
## 2017-05-12 NOTE — Telephone Encounter (Signed)
I attempted to reach patient. No answer.  Dr. Lorin Mercy is out of the office this week. Patient was last seen in January and was supposed to follow up in 2 months. (per chart, no show on 04/15/17).  Patient will have to have follow up appt to discuss letter and out of work note.

## 2017-05-13 ENCOUNTER — Encounter: Payer: Self-pay | Admitting: Pulmonary Disease

## 2017-05-13 ENCOUNTER — Ambulatory Visit (INDEPENDENT_AMBULATORY_CARE_PROVIDER_SITE_OTHER): Payer: Medicare Other | Admitting: Pulmonary Disease

## 2017-05-13 VITALS — BP 130/78 | HR 99 | Ht 67.5 in | Wt 245.0 lb

## 2017-05-13 DIAGNOSIS — D869 Sarcoidosis, unspecified: Secondary | ICD-10-CM | POA: Diagnosis not present

## 2017-05-13 DIAGNOSIS — G4733 Obstructive sleep apnea (adult) (pediatric): Secondary | ICD-10-CM | POA: Diagnosis not present

## 2017-05-13 DIAGNOSIS — J4520 Mild intermittent asthma, uncomplicated: Secondary | ICD-10-CM | POA: Diagnosis not present

## 2017-05-13 LAB — NITRIC OXIDE: Nitric Oxide: 17

## 2017-05-13 MED ORDER — BUDESONIDE-FORMOTEROL FUMARATE 160-4.5 MCG/ACT IN AERO: 2.0000 | INHALATION_SPRAY | Freq: Two times a day (BID) | RESPIRATORY_TRACT | 0 refills | Status: DC

## 2017-05-13 NOTE — Assessment & Plan Note (Signed)
Does not appear to be active, based on chest x-ray. Certainly does not have skin or eye involvement.  Has been seen by cardiology without any mention of cardiac involvement but has never had cardiac MRI, RBBB has been persistent since 2015.  We will get baseline PFTs and ACE level

## 2017-05-13 NOTE — Patient Instructions (Signed)
Check blood work today.  Asthma may be worse due to allergies, sinus drip or heartburn.  Sample of Symbicort 160-2 puffs twice daily, rinse mouth after use, this is your maintenance medication.  There is albuterol 2 puffs every 6 hours as needed only for wheezing  Schedule PFTs. Schedule home sleep test

## 2017-05-13 NOTE — Progress Notes (Signed)
Subjective:    Patient ID: Crystal Bullock, female    DOB: [birth year], 52 y.o.   MRN: [id number]  HPI  Chief Complaint  Patient presents with  . Pulm Consult    Per patient, she has had some increase in chest pressure for the past 3 weeks. She is not able to sleep flat due to SOB feeling. At night, she can hear her lungs wheezing and rattling. Productive cough with white phlegm.    52 year old never smoker With sarcoidosis and asthma presents for evaluation of shortness of breath and wheezing. She carries a diagnosis of sarcoidosis since 1997, biopsy performed by bronchoscopy back pain per report.  She was last evaluated at [facility] pulmonary about 3 years ago and told that she was in remission.  Her last dose of steroids for sarcoidosis was about a year ago. She was seen by cardiology in 09/2015, has chronic RBBB since 2015 Chest x-ray 01/2017 Does not show any infiltrates or mediastinal lymphadenopathy  She was also diagnosed with asthma in the late 90s when she lived in [place], she did not have this as a child, triggers included pine trees and allergies and sinus drip.  When her son started going to college at [employer], she moved to [place] and states that her asthma improved significantly since then. Over the past few weeks she reports increased shortness of breath and wheezing with intermittent chest tightness.  She attributes this to mold exposure as her bathroom is getting remodeled.  She reports significant orthopnea and cannot lie down flat for a few months.  Wheezing is worse when she lies down to sleep at night.  This was worse last week and she required a nebulizer treatment at [facility] urgent clinic.  She has an albuterol MDI which she has been using about 12 puffs daily and also uses an albuterol nebulizer.  Foot sleepiness score is 20 and she reports excessive daytime somnolence and fatigue.  Loud snoring has been noted by her sister.  She has used her sister's CPAP machine  when she is visiting with significant relief. She reports hot flashes inform many years has had to sleep with her window cracked open and also uses a ceiling fan.  She is afraid of catching pneumonia. Bedtime is around 10 PM, sleep latency about 30 minutes, reports 3-5 nocturnal awakenings including nocturia and is out of bed at 8 AM feeling tired without dryness of mouth or headaches. She has gained about 20 pounds in the last year.  I reviewed sleep study from 11/2015, also personally reviewed images of CT scan from 2017 and last chest x-ray     Significant tests/ events reviewed   FENO  17 Eos 04/2017 low 10/2015 Myoview low risk, EF 54% 11/2015 NPSG TST 196 minutes, AHI 0  CT angiogram 08/2015 negative PE /mediastinal lymph nodes  Past Medical History:  Diagnosis Date  . Anxiety 10/06/2015  . Asthma   . Atypical chest pain 10/06/2015  . Fibroid   . Hypertension   . Palpitations 10/06/2015  . RBBB 10/06/2015  . Sarcoidosis   . Scoliosis   . Seasonal allergies   . Sickle cell trait [facility])     Past Surgical History:  Procedure Laterality Date  . AXILLARY LYMPH NODE DISSECTION    . CHOLECYSTECTOMY      Allergies  Allergen Reactions  . Ivp Dye [Iodinated Diagnostic Agents]     Hives on hands and feet per pt 08/16/15 pt had scan w/ contrast and had  a 4 hr premedication protocol done     Social History   Socioeconomic History  . Marital status: Single    Spouse name: Not on file  . Number of children: Not on file  . Years of education: Not on file  . Highest education level: Not on file  Occupational History  . Not on file  Social Needs  . Financial resource strain: Not on file  . Food insecurity:    Worry: Not on file    Inability: Not on file  . Transportation needs:    Medical: Not on file    Non-medical: Not on file  Tobacco Use  . Smoking status: Former Research scientist (life sciences)  . Smokeless tobacco: Never Used  Substance and Sexual Activity  . Alcohol use: Yes    Comment:  occasional   . Drug use: No  . Sexual activity: Yes    Birth control/protection: Condom  Lifestyle  . Physical activity:    Days per week: Not on file    Minutes per session: Not on file  . Stress: Not on file  Relationships  . Social connections:    Talks on phone: Not on file    Gets together: Not on file    Attends religious service: Not on file    Active member of club or organization: Not on file    Attends meetings of clubs or organizations: Not on file    Relationship status: Not on file  . Intimate partner violence:    Fear of current or ex partner: Not on file    Emotionally abused: Not on file    Physically abused: Not on file    Forced sexual activity: Not on file  Other Topics Concern  . Not on file  Social History Narrative  . Not on file      Family History  Problem Relation Age of Onset  . Hypertension Mother   . Diabetes Mother      Review of Systems  Constitutional: Positive for unexpected weight change. Negative for fever.  HENT: Negative for congestion, dental problem, ear pain, nosebleeds, postnasal drip, rhinorrhea, sinus pressure, sneezing, sore throat and trouble swallowing.   Eyes: Negative for redness and itching.  Respiratory: Positive for cough and shortness of breath. Negative for chest tightness and wheezing.   Cardiovascular: Negative for palpitations and leg swelling.  Gastrointestinal: Negative for nausea and vomiting.  Genitourinary: Negative for dysuria.  Musculoskeletal: Positive for joint swelling.  Skin: Negative for rash.  Allergic/Immunologic: Positive for environmental allergies. Negative for food allergies and immunocompromised state.  Neurological: Positive for headaches.  Hematological: Does not bruise/bleed easily.  Psychiatric/Behavioral: Negative for dysphoric mood. The patient is nervous/anxious.        Objective:   Physical Exam  Gen. Pleasant, obese, in no distress, normal affect ENT - no lesions, no post nasal  drip, class 2 airway Neck: No JVD, no thyromegaly, no carotid bruits Lungs: no use of accessory muscles, no dullness to percussion, decreased without rales or rhonchi  Cardiovascular: Rhythm regular, heart sounds  normal, no murmurs or gallops, no peripheral edema Abdomen: soft and non-tender, no hepatosplenomegaly, BS normal. Musculoskeletal: No deformities, no cyanosis or clubbing Neuro:  alert, non focal, no tremors       Assessment & Plan:

## 2017-05-13 NOTE — Assessment & Plan Note (Signed)
All the prior sleep studies are negative, she does seem to be symptomatic and has had recent weight gain. Hence, should repeat as home sleep study  The pathophysiology of obstructive sleep apnea , it's cardiovascular consequences & modes of treatment including CPAP were discused with the patient in detail & they evidenced understanding.

## 2017-05-13 NOTE — Assessment & Plan Note (Signed)
We will establish phenotype, FEN no appears to be low. We will obtain RAST level, no eosinophilia  She triggers appear to be sinus drip, no obvious heartburn currently. Due to persistent symptoms and excessive albuterol usage, will initiate maintenance therapy with Symbicort 160.  She will call for prescription of this works

## 2017-05-15 ENCOUNTER — Ambulatory Visit (INDEPENDENT_AMBULATORY_CARE_PROVIDER_SITE_OTHER): Payer: Medicare Other | Admitting: Surgery

## 2017-05-15 ENCOUNTER — Encounter (INDEPENDENT_AMBULATORY_CARE_PROVIDER_SITE_OTHER): Payer: Self-pay | Admitting: Surgery

## 2017-05-15 VITALS — BP 144/94 | HR 99 | Ht 67.5 in | Wt 240.0 lb

## 2017-05-15 DIAGNOSIS — H16223 Keratoconjunctivitis sicca, not specified as Sjogren's, bilateral: Secondary | ICD-10-CM | POA: Diagnosis not present

## 2017-05-15 DIAGNOSIS — H524 Presbyopia: Secondary | ICD-10-CM | POA: Diagnosis not present

## 2017-05-15 DIAGNOSIS — M79671 Pain in right foot: Secondary | ICD-10-CM | POA: Diagnosis not present

## 2017-05-15 DIAGNOSIS — H25012 Cortical age-related cataract, left eye: Secondary | ICD-10-CM | POA: Diagnosis not present

## 2017-05-15 DIAGNOSIS — H04123 Dry eye syndrome of bilateral lacrimal glands: Secondary | ICD-10-CM | POA: Diagnosis not present

## 2017-05-15 DIAGNOSIS — M722 Plantar fascial fibromatosis: Secondary | ICD-10-CM

## 2017-05-15 NOTE — Progress Notes (Signed)
Office Visit Note   Patient: Crystal Bullock           Date of Birth: 1965-04-30           MRN: [id number] Visit Date: 05/15/2017              Requested by: Dorena Dew, FNP [address], [place] [address] PCP: Lanae Boast, FNP   Assessment & Plan: Visit Diagnoses:  1. Pain in right foot   2. Plantar fasciitis of right foot     Plan: Recommend conservative treatment with heel cord stretches and these were demonstrated.  Can use ice off and on as needed.  Follow-up in 6 weeks for recheck with Dr. Lorin Mercy.  If she still continues to be symptomatic we may consider trying injection.  Follow-Up Instructions: Return in about 6 weeks (around 06/26/2017) for dr yates recheck.   Orders:  No orders of the defined types were placed in this encounter.  No orders of the defined types were placed in this encounter.     Procedures: No procedures performed   Clinical Data: No additional findings.   Subjective: Chief Complaint  Patient presents with  . Right Foot - Pain    HPI 52 year old female comes in today with complaints of right plantar heel pain.  Pain ongoing for several weeks.  No injury.  Worse when she is up and ambulating.  No complaints of numbness tingling. Review of Systems No current cardiac pulmonary GI GU issues  Objective: Vital Signs: BP (!) 144/94   Pulse 99   Ht 5' 7.5" (1.715 m)   Wt 240 lb (108.9 kg)   BMI 37.03 kg/m   Physical Exam  Constitutional: She is oriented to person, place, and time. No distress.  HENT:  Head: Normocephalic and atraumatic.  Eyes: Pupils are equal, round, and reactive to light. EOM are normal.  Pulmonary/Chest: No respiratory distress.  Musculoskeletal:  Gait is somewhat antalgic.  Patient is markedly tender over the plantar fascial calcaneal insertion.  No swelling or bruising.  Neurovascular intact.  Neurological: She is alert and oriented to person, place, and time.  Skin: Skin is warm and  dry.    Ortho Exam  Specialty Comments:  No specialty comments available.  Imaging: No results found.   PMFS History: Patient Active Problem List   Diagnosis Date Noted  . OSA (obstructive sleep apnea) 05/13/2017  . Low back pain 02/24/2017  . Sarcoidosis 10/06/2015  . Essential hypertension 10/06/2015  . Asthma 10/06/2015  . Atypical chest pain 10/06/2015  . Anxiety 10/06/2015  . Palpitations 10/06/2015  . RBBB 10/06/2015   Past Medical History:  Diagnosis Date  . Anxiety 10/06/2015  . Asthma   . Atypical chest pain 10/06/2015  . Fibroid   . Hypertension   . Palpitations 10/06/2015  . RBBB 10/06/2015  . Sarcoidosis   . Scoliosis   . Seasonal allergies   . Sickle cell trait (HCC)     Family History  Problem Relation Age of Onset  . Hypertension Mother   . Diabetes Mother     Past Surgical History:  Procedure Laterality Date  . AXILLARY LYMPH NODE DISSECTION    . CHOLECYSTECTOMY     Social History   Occupational History  . Not on file  Tobacco Use  . Smoking status: Former Research scientist (life sciences)  . Smokeless tobacco: Never Used  Substance and Sexual Activity  . Alcohol use: Yes    Comment: occasional   .  Drug use: No  . Sexual activity: Yes    Birth control/protection: Condom

## 2017-05-16 ENCOUNTER — Other Ambulatory Visit: Payer: Self-pay

## 2017-05-16 DIAGNOSIS — G4733 Obstructive sleep apnea (adult) (pediatric): Secondary | ICD-10-CM

## 2017-05-21 DIAGNOSIS — Z79899 Other long term (current) drug therapy: Secondary | ICD-10-CM | POA: Diagnosis not present

## 2017-05-21 DIAGNOSIS — M545 Low back pain: Secondary | ICD-10-CM | POA: Diagnosis not present

## 2017-05-21 DIAGNOSIS — L732 Hidradenitis suppurativa: Secondary | ICD-10-CM | POA: Diagnosis not present

## 2017-05-22 DIAGNOSIS — L732 Hidradenitis suppurativa: Secondary | ICD-10-CM | POA: Diagnosis not present

## 2017-05-26 ENCOUNTER — Ambulatory Visit: Payer: Medicare Other | Admitting: Family Medicine

## 2017-06-06 ENCOUNTER — Ambulatory Visit: Payer: Medicare Other | Admitting: Family Medicine

## 2017-06-10 ENCOUNTER — Encounter (HOSPITAL_BASED_OUTPATIENT_CLINIC_OR_DEPARTMENT_OTHER): Payer: Self-pay

## 2017-06-19 ENCOUNTER — Ambulatory Visit (INDEPENDENT_AMBULATORY_CARE_PROVIDER_SITE_OTHER): Payer: Medicare Other | Admitting: Family Medicine

## 2017-06-19 ENCOUNTER — Encounter: Payer: Self-pay | Admitting: Family Medicine

## 2017-06-19 VITALS — BP 130/88 | HR 73 | Temp 98.9°F | Resp 16 | Ht 67.5 in | Wt 237.0 lb

## 2017-06-19 DIAGNOSIS — E669 Obesity, unspecified: Secondary | ICD-10-CM

## 2017-06-19 DIAGNOSIS — I10 Essential (primary) hypertension: Secondary | ICD-10-CM

## 2017-06-19 NOTE — Progress Notes (Signed)
Subjective:    Patient ID: Crystal Bullock, female    DOB: [birth year], 52 y.o.   MRN: [id number]  HPI   Crystal Bullock, a 52 year old female with a history of asthma, sarcoidosis, and hypertension presents for follow-up of hypertension.  Patient has been taking amlodipine 10 mg consistently over the past month.  She is not been following a low-fat, low-sodium diet or exercising routinely.Body mass index is 36.57 kg/m.,   patient currently denies chest pains, heart palpitations, fatigue, shortness of breath, or lower extremity edema.  Patient the cardiac risk factors include sedentary lifestyle and obesity. Past Medical History:  Diagnosis Date  . Anxiety 10/06/2015  . Asthma   . Atypical chest pain 10/06/2015  . Fibroid   . Hypertension   . Palpitations 10/06/2015  . RBBB 10/06/2015  . Sarcoidosis   . Scoliosis   . Seasonal allergies   . Sickle cell trait ([place])    Social History   Socioeconomic History  . Marital status: Single    Spouse name: Not on file  . Number of children: Not on file  . Years of education: Not on file  . Highest education level: Not on file  Occupational History  . Not on file  Social Needs  . Financial resource strain: Not on file  . Food insecurity:    Worry: Not on file    Inability: Not on file  . Transportation needs:    Medical: Not on file    Non-medical: Not on file  Tobacco Use  . Smoking status: Former Research scientist (life sciences)  . Smokeless tobacco: Never Used  Substance and Sexual Activity  . Alcohol use: Yes    Comment: occasional   . Drug use: No  . Sexual activity: Yes    Birth control/protection: Condom  Lifestyle  . Physical activity:    Days per week: Not on file    Minutes per session: Not on file  . Stress: Not on file  Relationships  . Social connections:    Talks on phone: Not on file    Gets together: Not on file    Attends religious service: Not on file    Active member of club or organization: Not on file    Attends meetings of  clubs or organizations: Not on file    Relationship status: Not on file  . Intimate partner violence:    Fear of current or ex partner: Not on file    Emotionally abused: Not on file    Physically abused: Not on file    Forced sexual activity: Not on file  Other Topics Concern  . Not on file  Social History Narrative  . Not on file   Review of Systems  Constitutional: Negative.   HENT: Negative.   Eyes: Negative.   Respiratory: Negative.   Cardiovascular: Negative.   Gastrointestinal: Negative.   Endocrine: Negative.   Musculoskeletal: Negative.   Skin: Negative.   Allergic/Immunologic: Negative.   Neurological: Negative.   Hematological: Negative.   Psychiatric/Behavioral: Negative.        Objective:   Physical Exam  Constitutional: She is oriented to person, place, and time. She appears well-developed and well-nourished.  HENT:  Head: Normocephalic.  Right Ear: External ear normal.  Left Ear: External ear normal.  Eyes: Pupils are equal, round, and reactive to light.  Neck: Normal range of motion.  Cardiovascular: Normal rate, regular rhythm, normal heart sounds and intact distal pulses.  Abdominal: Soft. Bowel sounds are normal.  Neurological: She  is alert and oriented to person, place, and time.  Skin: Skin is warm and dry.  Psychiatric: She has a normal mood and affect. Her behavior is normal. Judgment and thought content normal.       BP 130/88 (BP Location: Left Arm, Patient Position: Sitting, Cuff Size: Large)   Pulse 73   Temp 98.9 F (37.2 C) (Oral)   Resp 16   Ht 5' 7.5" (1.715 m)   Wt 237 lb (107.5 kg)   SpO2 99%   BMI 36.57 kg/m  Assessment & Plan:  1. Essential hypertension Blood pressure is at goal on current medication regimen, no medication changes warranted on today.  Will continue amlodipine 10 mg daily.  Will review renal functioning as results become available. We have discussed target BP range and blood pressure goal. I have advised  patient to check BP regularly and to call us back or report to clinic if the numbers are consistently higher than 140/90. We discussed the importance of compliance with medical therapy and DASH diet recommended, consequences of uncontrolled hypertension discussed.  - continue current BP medications  - Basic Metabolic Panel  2. Obesity (BMI 30-39.9) Body mass index is 36.57 kg/m. Weight loss goal is to reduce weight by 10% over the next 6 months. Recommend a lowfat, low carbohydrate diet divided over 5-6 small meals, increase water intake to 6-8 glasses, and 150 minutes per week of cardiovascular exercise.     RTC: 3 months for hypertension   Donia Pounds  MSN, FNP-C Patient [place] [address] [place], [place] [address] [phone]

## 2017-06-19 NOTE — Patient Instructions (Signed)
Your blood pressure is at goal on today.  No medication changes are warranted at this time.  Blood pressure goal is less than 140/90.  I recommend that you follow a DASH diet which is a low-fat, low-sodium diet.  Divided your diet over 5-6 small meals throughout the day.  Increase water intake to 3-4 bottles per day.  Recommend that she slowly introduce low impact cardiovascular exercise.  We will follow-up in 3 months for hypertension.

## 2017-06-20 DIAGNOSIS — M5136 Other intervertebral disc degeneration, lumbar region: Secondary | ICD-10-CM | POA: Diagnosis not present

## 2017-06-20 DIAGNOSIS — M545 Low back pain: Secondary | ICD-10-CM | POA: Diagnosis not present

## 2017-06-20 DIAGNOSIS — Z79899 Other long term (current) drug therapy: Secondary | ICD-10-CM | POA: Diagnosis not present

## 2017-06-20 LAB — BASIC METABOLIC PANEL
BUN/Creatinine Ratio: 11 (ref 9–23)
BUN: 11 mg/dL (ref 6–24)
CO2: 25 mmol/L (ref 20–29)
Calcium: 9.7 mg/dL (ref 8.7–10.2)
Chloride: 100 mmol/L (ref 96–106)
Creatinine, Ser: 1.02 mg/dL — ABNORMAL HIGH (ref 0.57–1.00)
GFR calc Af Amer: 75 mL/min/{1.73_m2} (ref >59)
GFR calc non Af Amer: 65 mL/min/{1.73_m2} (ref >59)
Glucose: 100 mg/dL — ABNORMAL HIGH (ref 65–99)
Potassium: 3.7 mmol/L (ref 3.5–5.2)
Sodium: 140 mmol/L (ref 134–144)

## 2017-06-23 ENCOUNTER — Ambulatory Visit: Payer: Medicare Other | Admitting: Psychology

## 2017-06-24 ENCOUNTER — Ambulatory Visit (INDEPENDENT_AMBULATORY_CARE_PROVIDER_SITE_OTHER): Payer: Medicare Other | Admitting: Pulmonary Disease

## 2017-06-24 ENCOUNTER — Encounter: Payer: Self-pay | Admitting: Pulmonary Disease

## 2017-06-24 ENCOUNTER — Ambulatory Visit: Payer: Medicare Other | Admitting: Pulmonary Disease

## 2017-06-24 VITALS — BP 130/86 | HR 82

## 2017-06-24 DIAGNOSIS — G4733 Obstructive sleep apnea (adult) (pediatric): Secondary | ICD-10-CM | POA: Diagnosis not present

## 2017-06-24 DIAGNOSIS — R0789 Other chest pain: Secondary | ICD-10-CM

## 2017-06-24 DIAGNOSIS — J4520 Mild intermittent asthma, uncomplicated: Secondary | ICD-10-CM

## 2017-06-24 DIAGNOSIS — D869 Sarcoidosis, unspecified: Secondary | ICD-10-CM

## 2017-06-24 LAB — PULMONARY FUNCTION TEST
FEF 25-75 Pre: 1.56 L/sec
FEF2575-%Pred-Pre: 57 %
FEV1-%Pred-Pre: 64 %
FEV1-Pre: 1.71 L
FEV1FVC-%Pred-Pre: 93 %
FEV6-%Pred-Pre: 69 %
FEV6-Pre: 2.23 L
FEV6FVC-%Pred-Pre: 103 %
FVC-%Pred-Pre: 67 %
FVC-Pre: 2.23 L
Pre FEV1/FVC ratio: 77 %
Pre FEV6/FVC Ratio: 100 %

## 2017-06-24 NOTE — Assessment & Plan Note (Signed)
Await NPSG

## 2017-06-24 NOTE — Progress Notes (Signed)
   Subjective:    Patient ID: Crystal Bullock, female    DOB: [birth year], 52 y.o.   MRN: [id number]  HPI   52 year old never smoker With sarcoidosis and asthma presents for evaluation of shortness of breath and wheezing. She carries a diagnosis of sarcoidosis since 1997, biopsy performed by bronchoscopy back pain per report.  She was last evaluated at [facility] pulmonary about 3 years ago and told that she was in remission.  Her last dose of steroids for sarcoidosis was about a year ago. She was seen by cardiology in 09/2015, has chronic RBBB since 2015 Chest x-ray 01/2017 Does not show any infiltrates or mediastinal lymphadenopathy  She was also diagnosed with asthma in the late 90s when she lived in [place], she did not have this as a child, triggers included pine trees and allergies and sinus drip.    She developed chest pain on her second effort at spirometry today, seems like a pulled muscle, EKG was done and that showed chronic right bundle branch block. Chest pain seemed to improve after about 15 to 30 minutes of resting, was radiating to her left scapula, reproducible tenderness over the left intercostal space second. Last visit she was given Symbicort, but she only took this twice and breathing was improved so she stopped taking this and is only using albuterol on an as-needed basis.  She feels that since moving out of the house where she was exposed to mold this is improved.  She had to reschedule her sleep study       Significant tests/ events reviewed   FENO  17 Eos 04/2017 low  10/2015 Myoview low risk, EF 54% 11/2015 NPSG TST 196 minutes, AHI 0  CT angiogram 08/2015 negative PE /mediastinal lymph nodes   Review of Systems neg for any significant sore throat, dysphagia, itching, sneezing, nasal congestion or excess/ purulent secretions, fever, chills, sweats, unintended wt loss, pleuritic or exertional cp, hempoptysis, orthopnea pnd or change in chronic  leg swelling. Also denies presyncope, palpitations, heartburn, abdominal pain, nausea, vomiting, diarrhea or change in bowel or urinary habits, dysuria,hematuria, rash, arthralgias, visual complaints, headache, numbness weakness or ataxia.     Objective:   Physical Exam  Gen. Pleasant, obese, in no distress ENT - no lesions, no post nasal drip Neck: No JVD, no thyromegaly, no carotid bruits Lungs: no use of accessory muscles, no dullness to percussion, decreased without rales or rhonchi  Cardiovascular: tenderness left 2nd ICS,Rhythm regular, heart sounds  normal, no murmurs or gallops, no peripheral edema Musculoskeletal: No deformities, no cyanosis or clubbing , no tremors        Assessment & Plan:

## 2017-06-24 NOTE — Patient Instructions (Signed)
Sarcoidosis is stable OK to stop symbicort Use albuterol as needed

## 2017-06-24 NOTE — Progress Notes (Signed)
PFT scheduled for 06/24/17. When attempting to completed pre spiro patient started complaining of chest pain. Patient states that it was a level 8. Per RA EKG was obtained. Patient did not want to stay to see provider she wanted to go home. Per RA patient was ok to drive home and leave.

## 2017-06-24 NOTE — Assessment & Plan Note (Signed)
Appears stable mild restriction may be related to obesity , Doubt active sarcoid

## 2017-06-24 NOTE — Assessment & Plan Note (Signed)
Continue albuterol as needed basis. Okay to stop Symbicort

## 2017-07-01 ENCOUNTER — Ambulatory Visit (INDEPENDENT_AMBULATORY_CARE_PROVIDER_SITE_OTHER): Payer: Medicare Other | Admitting: Orthopaedic Surgery

## 2017-07-01 ENCOUNTER — Encounter (INDEPENDENT_AMBULATORY_CARE_PROVIDER_SITE_OTHER): Payer: Self-pay | Admitting: Orthopaedic Surgery

## 2017-07-01 ENCOUNTER — Encounter (INDEPENDENT_AMBULATORY_CARE_PROVIDER_SITE_OTHER): Payer: Self-pay

## 2017-07-01 VITALS — BP 137/90 | HR 87 | Ht 67.5 in | Wt 238.0 lb

## 2017-07-01 DIAGNOSIS — M722 Plantar fascial fibromatosis: Secondary | ICD-10-CM | POA: Diagnosis not present

## 2017-07-01 MED ORDER — LIDOCAINE HCL 1 % IJ SOLN
0.3300 mL | INTRAMUSCULAR | Status: AC | PRN
  Administered 2017-07-01: .33 mL

## 2017-07-01 MED ORDER — METHYLPREDNISOLONE ACETATE 40 MG/ML IJ SUSP
40.0000 mg | INTRAMUSCULAR | Status: AC | PRN
  Administered 2017-07-01: 40 mg

## 2017-07-01 MED ORDER — LIDOCAINE HCL (PF) 1 % IJ SOLN
1.0000 mL | INTRAMUSCULAR | Status: AC | PRN
  Administered 2017-07-01: 1 mL
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
## 2017-07-03 ENCOUNTER — Encounter (HOSPITAL_BASED_OUTPATIENT_CLINIC_OR_DEPARTMENT_OTHER): Payer: Self-pay

## 2017-07-09 DIAGNOSIS — L700 Acne vulgaris: Secondary | ICD-10-CM | POA: Diagnosis not present

## 2017-07-09 DIAGNOSIS — L732 Hidradenitis suppurativa: Secondary | ICD-10-CM | POA: Diagnosis not present

## 2017-07-15 ENCOUNTER — Ambulatory Visit (INDEPENDENT_AMBULATORY_CARE_PROVIDER_SITE_OTHER): Payer: Medicare Other | Admitting: Orthopaedic Surgery

## 2017-07-15 DIAGNOSIS — M545 Low back pain: Secondary | ICD-10-CM | POA: Diagnosis not present

## 2017-07-15 DIAGNOSIS — M5136 Other intervertebral disc degeneration, lumbar region: Secondary | ICD-10-CM | POA: Diagnosis not present

## 2017-07-15 DIAGNOSIS — Z79899 Other long term (current) drug therapy: Secondary | ICD-10-CM | POA: Diagnosis not present

## 2017-07-15 DIAGNOSIS — J309 Allergic rhinitis, unspecified: Secondary | ICD-10-CM | POA: Diagnosis not present

## 2017-07-18 ENCOUNTER — Ambulatory Visit (INDEPENDENT_AMBULATORY_CARE_PROVIDER_SITE_OTHER): Payer: Medicare Other | Admitting: Family Medicine

## 2017-07-18 ENCOUNTER — Other Ambulatory Visit: Payer: Self-pay

## 2017-07-18 ENCOUNTER — Encounter: Payer: Self-pay | Admitting: Family Medicine

## 2017-07-18 VITALS — BP 140/80 | HR 77 | Temp 98.5°F | Resp 16 | Ht 67.5 in | Wt 242.0 lb

## 2017-07-18 DIAGNOSIS — R42 Dizziness and giddiness: Secondary | ICD-10-CM | POA: Diagnosis not present

## 2017-07-18 MED ORDER — MECLIZINE HCL 12.5 MG PO TABS: 12.5000 mg | ORAL_TABLET | Freq: Three times a day (TID) | ORAL | 1 refills | Status: DC | PRN

## 2017-07-18 NOTE — Patient Instructions (Addendum)
Dizziness Dizziness is a common problem. It makes you feel unsteady or light-headed. You may feel like you are about to pass out (faint). Dizziness can lead to getting hurt if you stumble or fall. Dizziness can be caused by many things, including:  Medicines.  Not having enough water in your body (dehydration).  Illness.  Follow these instructions at home: Eating and drinking  Drink enough fluid to keep your pee (urine) clear or pale yellow. This helps to keep you from getting dehydrated. Try to drink more clear fluids, such as water.  Do not drink alcohol.  Limit how much caffeine you drink or eat, if your doctor tells you to do that.  Limit how much salt (sodium) you drink or eat, if your doctor tells you to do that. Activity  Avoid making quick movements. ? When you stand up from sitting in a chair, steady yourself until you feel okay. ? In the morning, first sit up on the side of the bed. When you feel okay, stand slowly while you hold onto something. Do this until you know that your balance is fine.  If you need to stand in one place for a long time, move your legs often. Tighten and relax the muscles in your legs while you are standing.  Do not drive or use heavy machinery if you feel dizzy.  Avoid bending down if you feel dizzy. Place items in your home so you can reach them easily without leaning over. Lifestyle  Do not use any products that contain nicotine or tobacco, such as cigarettes and e-cigarettes. If you need help quitting, ask your doctor.  Try to lower your stress level. You can do this by using methods such as yoga or meditation. Talk with your doctor if you need help. General instructions  Watch your dizziness for any changes.  Take over-the-counter and prescription medicines only as told by your doctor. Talk with your doctor if you think that you are dizzy because of a medicine that you are taking.  Tell a friend or a family member that you are feeling  dizzy. If he or she notices any changes in your behavior, have this person call your doctor.  Keep all follow-up visits as told by your doctor. This is important. Contact a doctor if:  Your dizziness does not go away.  Your dizziness or light-headedness gets worse.  You feel sick to your stomach (nauseous).  You have trouble hearing.  You have new symptoms.  You are unsteady on your feet.  You feel like the room is spinning. Get help right away if:  You throw up (vomit) or have watery poop (diarrhea), and you cannot eat or drink anything.  You have trouble: ? Talking. ? Walking. ? Swallowing. ? Using your arms, hands, or legs.  You feel generally weak.  You are not thinking clearly, or you have trouble forming sentences. A friend or family member may notice this.  You have: ? Chest pain. ? Pain in your belly (abdomen). ? Shortness of breath. ? Sweating.  Your vision changes.  You are bleeding.  You have a very bad headache.  You have neck pain or a stiff neck.  You have a fever. These symptoms may be an emergency. Do not wait to see if the symptoms will go away. Get medical help right away. Call your local emergency services (911 in the [place] Do not drive yourself to the hospital. Summary  Dizziness makes you feel unsteady or light-headed. You may   may feel like you are about to pass out (faint).  Drink enough fluid to keep your pee (urine) clear or pale yellow. Do not drink alcohol.  Avoid making quick movements if you feel dizzy.  Watch your dizziness for any changes. This information is not intended to replace advice given to you by your health care provider. Make sure you discuss any questions you have with your health care provider. Document Released: 01/17/2011 Document Revised: 02/15/2016 Document Reviewed: 02/15/2016 Elsevier Interactive Patient Education  2017 Show Low tablets or capsules What is this medicine? MECLIZINE (MEK  li zeen) is an antihistamine. It is used to prevent nausea, vomiting, or dizziness caused by motion sickness. It is also used to prevent and treat vertigo (extreme dizziness or a feeling that you or your surroundings are tilting or spinning around). This medicine may be used for other purposes; ask your health care provider or pharmacist if you have questions. COMMON BRAND NAME(S): Antivert, Dramamine Less Drowsy, Medivert, Meni-D What should I tell my health care provider before I take this medicine? They need to know if you have any of these conditions: -glaucoma -lung or breathing disease, like asthma -problems urinating -prostate disease -stomach or intestine problems -an unusual or allergic reaction to meclizine, other medicines, foods, dyes, or preservatives -pregnant or trying to get pregnant -breast-feeding How should I use this medicine? Take this medicine by mouth with a glass of water. Follow the directions on the prescription label. If you are using this medicine to prevent motion sickness, take the dose at least 1 hour before travel. If it upsets your stomach, take it with food or milk. Take your doses at regular intervals. Do not take your medicine more often than directed. Talk to your pediatrician regarding the use of this medicine in children. Special care may be needed. Overdosage: If you think you have taken too much of this medicine contact a poison control center or emergency room at once. NOTE: This medicine is only for you. Do not share this medicine with others. What if I miss a dose? If you miss a dose, take it as soon as you can. If it is almost time for your next dose, take only that dose. Do not take double or extra doses. What may interact with this medicine? Do not take this medicine with any of the following medications: -MAOIs like Carbex, Eldepryl, Marplan, Nardil, and Parnate This medicine may also interact with the following  medications: -alcohol -antihistamines for allergy, cough and cold -certain medicines for anxiety or sleep -certain medicines for depression, like amitriptyline, fluoxetine, sertraline -certain medicines for seizures like phenobarbital, primidone -general anesthetics like halothane, isoflurane, methoxyflurane, propofol -local anesthetics like lidocaine, pramoxine, tetracaine -medicines that relax muscles for surgery -narcotic medicines for pain -phenothiazines like chlorpromazine, mesoridazine, prochlorperazine, thioridazine This list may not describe all possible interactions. Give your health care provider a list of all the medicines, herbs, non-prescription drugs, or dietary supplements you use. Also tell them if you smoke, drink alcohol, or use illegal drugs. Some items may interact with your medicine. What should I watch for while using this medicine? Tell your doctor or healthcare professional if your symptoms do not start to get better or if they get worse. You may get drowsy or dizzy. Do not drive, use machinery, or do anything that needs mental alertness until you know how this medicine affects you. Do not stand or sit up quickly, especially if you are an older patient. This reduces the risk of  dizzy or fainting spells. Alcohol may interfere with the effect of this medicine. Avoid alcoholic drinks. Your mouth may get dry. Chewing sugarless gum or sucking hard candy, and drinking plenty of water may help. Contact your doctor if the problem does not go away or is severe. This medicine may cause dry eyes and blurred vision. If you wear contact lenses you may feel some discomfort. Lubricating drops may help. See your eye doctor if the problem does not go away or is severe. What side effects may I notice from receiving this medicine? Side effects that you should report to your doctor or health care professional as soon as possible: -feeling faint or lightheaded, falls -fast, irregular  heartbeat Side effects that usually do not require medical attention (report to your doctor or health care professional if they continue or are bothersome): -constipation -headache -trouble passing urine or change in the amount of urine -trouble sleeping -upset stomach This list may not describe all possible side effects. Call your doctor for medical advice about side effects. You may report side effects to FDA at 1-800-FDA-1088. Where should I keep my medicine? Keep out of the reach of children. Store at room temperature between 15 and 30 degrees C (59 and 86 degrees F). Keep container tightly closed. Throw away any unused medicine after the expiration date. NOTE: This sheet is a summary. It may not cover all possible information. If you have questions about this medicine, talk to your doctor, pharmacist, or health care provider.  2018 Elsevier/Gold Standard (2015-03-01 19:41:02)

## 2017-07-18 NOTE — Progress Notes (Signed)
Subjective:    Patient ID: Crystal Bullock, female    DOB: [birth year], 52 y.o.   MRN: [id number]  Crystal Bullock, a 52 year old female presents complaining of dizziness since 07/14/2017. She says that symptoms occurred shortly after receiving a cortisone injection .  Patient characterizes dizziness as sudden onset.  She has not attempted any over-the-counter interventions to alleviate symptoms.  Patient has a history of hypertension and has been taking amlodipine 10 mg consistently.  Patient did not take medication prior to arrival today.  Patient endorses dizziness, weakness, fatigue, and lightheadedness.  She denies fever, ear pain, congestion, cough, nausea, vomiting, or diarrhea.  Dizziness  Associated symptoms include fatigue and vertigo. Pertinent negatives include no anorexia, congestion, coughing, diaphoresis, headaches, joint swelling, myalgias, nausea, neck pain, numbness, rash, swollen glands, visual change, vomiting or weakness.     Past Medical History:  Diagnosis Date  . Anxiety 10/06/2015  . Asthma   . Atypical chest pain 10/06/2015  . Fibroid   . Hypertension   . Palpitations 10/06/2015  . RBBB 10/06/2015  . Sarcoidosis   . Scoliosis   . Seasonal allergies   . Sickle cell trait ([place])    Social History   Socioeconomic History  . Marital status: Single    Spouse name: Not on file  . Number of children: Not on file  . Years of education: Not on file  . Highest education level: Not on file  Occupational History  . Not on file  Social Needs  . Financial resource strain: Not on file  . Food insecurity:    Worry: Not on file    Inability: Not on file  . Transportation needs:    Medical: Not on file    Non-medical: Not on file  Tobacco Use  . Smoking status: Former Research scientist (life sciences)  . Smokeless tobacco: Never Used  Substance and Sexual Activity  . Alcohol use: Yes    Comment: occasional   . Drug use: No  . Sexual activity: Yes    Birth control/protection: Condom   Lifestyle  . Physical activity:    Days per week: Not on file    Minutes per session: Not on file  . Stress: Not on file  Relationships  . Social connections:    Talks on phone: Not on file    Gets together: Not on file    Attends religious service: Not on file    Active member of club or organization: Not on file    Attends meetings of clubs or organizations: Not on file    Relationship status: Not on file  . Intimate partner violence:    Fear of current or ex partner: Not on file    Emotionally abused: Not on file    Physically abused: Not on file    Forced sexual activity: Not on file  Other Topics Concern  . Not on file  Social History Narrative  . Not on file   Review of Systems  Constitutional: Positive for fatigue. Negative for diaphoresis.  HENT: Negative for congestion.        Occasional ear pressure  Eyes: Negative.   Respiratory: Negative.  Negative for cough.   Cardiovascular: Negative.   Gastrointestinal: Negative.  Negative for anorexia, nausea and vomiting.  Endocrine: Negative.   Musculoskeletal: Positive for back pain. Negative for joint swelling, myalgias and neck pain.  Skin: Negative for rash.  Neurological: Positive for dizziness, vertigo and light-headedness. Negative for tremors, syncope, facial asymmetry, weakness, numbness and headaches.  Objective:   Physical Exam  Constitutional: She is oriented to person, place, and time. She appears well-developed and well-nourished.  HENT:  Head: Normocephalic.  Right Ear: External ear normal.  Left Ear: External ear normal.  Eyes: Pupils are equal, round, and reactive to light.  Cardiovascular: Normal rate, regular rhythm and normal heart sounds.  Pulmonary/Chest: Effort normal and breath sounds normal.  Abdominal: Soft. Bowel sounds are normal.  Neurological: She is alert and oriented to person, place, and time. She has normal strength. No cranial nerve deficit or sensory deficit. Gait normal.   Reflex Scores:      Tricep reflexes are 2+ on the right side and 2+ on the left side.      Bicep reflexes are 2+ on the right side and 2+ on the left side.      Brachioradialis reflexes are 2+ on the right side and 2+ on the left side.      Patellar reflexes are 2+ on the right side and 2+ on the left side.      Achilles reflexes are 2+ on the right side and 2+ on the left side. Skin: Skin is warm and dry.      BP 140/80 (BP Location: Left Arm, Patient Position: Sitting, Cuff Size: Large)   Pulse 77   Temp 98.5 F (36.9 C) (Oral)   Resp 16   Ht 5' 7.5" (1.715 m)   Wt 242 lb (109.8 kg)   SpO2 100%   BMI 37.34 kg/m  Assessment & Plan:   Dizziness Neurological exam unremarkable, no focal neurological deficits noted.  No nystagmus on physical exam.  Orthostatic vital signs within normal range. Blood pressure is at goal, patient states that she did not take medication prior to arrival.  Discussed the importance of taking medication consistently in order to achieve positive outcomes. Reviewed EKG results as they become available  We will start a trial of meclizine 12.5 mg 3 times daily as needed - Orthostatic vital signs - EKG 12-Lead - Comprehensive metabolic panel - CBC - meclizine (ANTIVERT) 12.5 MG tablet; Take 1 tablet (12.5 mg total) by mouth 3 (three) times daily as needed for dizziness.  Dispense: 30 tablet; Refill: 1    The patient was given clear instructions to go to ER or return to medical center if symptoms do not improve, worsen or new problems develop. The patient verbalized understanding.      Donia Pounds  MSN, FNP-C [facility] [address] [place], [place] [address] [phone]

## 2017-07-19 LAB — CBC
Hematocrit: 36.4 % (ref 34.0–46.6)
Hemoglobin: 12 g/dL (ref 11.1–15.9)
MCH: 25 pg — ABNORMAL LOW (ref 26.6–33.0)
MCHC: 33 g/dL (ref 31.5–35.7)
MCV: 76 fL — ABNORMAL LOW (ref 79–97)
Platelets: 285 10*3/uL (ref 150–450)
RBC: 4.8 x10E6/uL (ref 3.77–5.28)
RDW: 16.6 % — ABNORMAL HIGH (ref 12.3–15.4)
WBC: 11.6 10*3/uL — ABNORMAL HIGH (ref 3.4–10.8)

## 2017-07-19 LAB — COMPREHENSIVE METABOLIC PANEL
ALT: 13 IU/L (ref 0–32)
AST: 11 IU/L (ref 0–40)
Albumin/Globulin Ratio: 1.3 (ref 1.2–2.2)
Albumin: 3.9 g/dL (ref 3.5–5.5)
Alkaline Phosphatase: 66 IU/L (ref 39–117)
BUN/Creatinine Ratio: 22 (ref 9–23)
BUN: 18 mg/dL (ref 6–24)
Bilirubin Total: <0.2 mg/dL (ref 0.0–1.2)
CO2: 22 mmol/L (ref 20–29)
Calcium: 9.3 mg/dL (ref 8.7–10.2)
Chloride: 104 mmol/L (ref 96–106)
Creatinine, Ser: 0.83 mg/dL (ref 0.57–1.00)
GFR calc Af Amer: 96 mL/min/{1.73_m2} (ref >59)
GFR calc non Af Amer: 83 mL/min/{1.73_m2} (ref >59)
Globulin, Total: 2.9 g/dL (ref 1.5–4.5)
Glucose: 129 mg/dL — ABNORMAL HIGH (ref 65–99)
Potassium: 3.6 mmol/L (ref 3.5–5.2)
Sodium: 139 mmol/L (ref 134–144)
Total Protein: 6.8 g/dL (ref 6.0–8.5)

## 2017-07-31 ENCOUNTER — Encounter (HOSPITAL_BASED_OUTPATIENT_CLINIC_OR_DEPARTMENT_OTHER): Payer: Self-pay

## 2017-08-05 ENCOUNTER — Ambulatory Visit (INDEPENDENT_AMBULATORY_CARE_PROVIDER_SITE_OTHER): Payer: Medicare Other | Admitting: Orthopaedic Surgery

## 2017-08-13 DIAGNOSIS — Z79899 Other long term (current) drug therapy: Secondary | ICD-10-CM | POA: Diagnosis not present

## 2017-08-13 DIAGNOSIS — M545 Low back pain: Secondary | ICD-10-CM | POA: Diagnosis not present

## 2017-08-13 DIAGNOSIS — M5136 Other intervertebral disc degeneration, lumbar region: Secondary | ICD-10-CM | POA: Diagnosis not present

## 2017-08-19 ENCOUNTER — Ambulatory Visit (INDEPENDENT_AMBULATORY_CARE_PROVIDER_SITE_OTHER): Payer: Medicare Other | Admitting: Orthopaedic Surgery

## 2017-08-22 ENCOUNTER — Ambulatory Visit (HOSPITAL_BASED_OUTPATIENT_CLINIC_OR_DEPARTMENT_OTHER): Payer: Medicare Other | Attending: Pulmonary Disease

## 2017-09-10 DIAGNOSIS — L732 Hidradenitis suppurativa: Secondary | ICD-10-CM | POA: Diagnosis not present

## 2017-09-10 DIAGNOSIS — L700 Acne vulgaris: Secondary | ICD-10-CM | POA: Diagnosis not present

## 2017-09-10 DIAGNOSIS — B373 Candidiasis of vulva and vagina: Secondary | ICD-10-CM | POA: Diagnosis not present

## 2017-09-11 DIAGNOSIS — M545 Low back pain: Secondary | ICD-10-CM | POA: Diagnosis not present

## 2017-09-11 DIAGNOSIS — M5136 Other intervertebral disc degeneration, lumbar region: Secondary | ICD-10-CM | POA: Diagnosis not present

## 2017-09-11 DIAGNOSIS — G894 Chronic pain syndrome: Secondary | ICD-10-CM | POA: Diagnosis not present

## 2017-09-11 DIAGNOSIS — Z79899 Other long term (current) drug therapy: Secondary | ICD-10-CM | POA: Diagnosis not present

## 2017-09-14 ENCOUNTER — Emergency Department (HOSPITAL_COMMUNITY)
Admission: EM | Admit: 2017-09-14 | Discharge: 2017-09-14 | Disposition: A | Discharge status: Home / Self Care | Payer: Medicare Other | Attending: Emergency Medicine | Admitting: Emergency Medicine

## 2017-09-14 ENCOUNTER — Other Ambulatory Visit: Payer: Self-pay

## 2017-09-14 ENCOUNTER — Encounter (HOSPITAL_COMMUNITY): Payer: Self-pay | Admitting: Emergency Medicine

## 2017-09-14 ENCOUNTER — Emergency Department (HOSPITAL_COMMUNITY): Payer: Medicare Other

## 2017-09-14 DIAGNOSIS — R1030 Lower abdominal pain, unspecified: Secondary | ICD-10-CM | POA: Insufficient documentation

## 2017-09-14 DIAGNOSIS — K5730 Diverticulosis of large intestine without perforation or abscess without bleeding: Secondary | ICD-10-CM | POA: Diagnosis not present

## 2017-09-14 DIAGNOSIS — I10 Essential (primary) hypertension: Secondary | ICD-10-CM | POA: Diagnosis not present

## 2017-09-14 DIAGNOSIS — D573 Sickle-cell trait: Secondary | ICD-10-CM | POA: Insufficient documentation

## 2017-09-14 DIAGNOSIS — Z87891 Personal history of nicotine dependence: Secondary | ICD-10-CM | POA: Insufficient documentation

## 2017-09-14 DIAGNOSIS — I4510 Unspecified right bundle-branch block: Secondary | ICD-10-CM | POA: Diagnosis not present

## 2017-09-14 DIAGNOSIS — Z79899 Other long term (current) drug therapy: Secondary | ICD-10-CM | POA: Insufficient documentation

## 2017-09-14 DIAGNOSIS — R1032 Left lower quadrant pain: Secondary | ICD-10-CM | POA: Diagnosis not present

## 2017-09-14 DIAGNOSIS — J45909 Unspecified asthma, uncomplicated: Secondary | ICD-10-CM | POA: Diagnosis not present

## 2017-09-14 DIAGNOSIS — R1031 Right lower quadrant pain: Secondary | ICD-10-CM | POA: Diagnosis not present

## 2017-09-14 LAB — COMPREHENSIVE METABOLIC PANEL
ALT: 22 U/L (ref 0–44)
AST: 24 U/L (ref 15–41)
Albumin: 3.6 g/dL (ref 3.5–5.0)
Alkaline Phosphatase: 52 U/L (ref 38–126)
Anion gap: 11 (ref 5–15)
BUN: 10 mg/dL (ref 6–20)
CO2: 26 mmol/L (ref 22–32)
Calcium: 9.4 mg/dL (ref 8.9–10.3)
Chloride: 100 mmol/L (ref 98–111)
Creatinine, Ser: 1.02 mg/dL — ABNORMAL HIGH (ref 0.44–1.00)
GFR calc Af Amer: >60 mL/min (ref >60)
GFR calc non Af Amer: >60 mL/min (ref >60)
Glucose, Bld: 98 mg/dL (ref 70–99)
Potassium: 3.8 mmol/L (ref 3.5–5.1)
Sodium: 137 mmol/L (ref 135–145)
Total Bilirubin: 0.7 mg/dL (ref 0.3–1.2)
Total Protein: 7.5 g/dL (ref 6.5–8.1)

## 2017-09-14 LAB — CBC
HCT: 40.4 % (ref 36.0–46.0)
Hemoglobin: 13.7 g/dL (ref 12.0–15.0)
MCH: 25.4 pg — ABNORMAL LOW (ref 26.0–34.0)
MCHC: 33.9 g/dL (ref 30.0–36.0)
MCV: 74.8 fL — ABNORMAL LOW (ref 78.0–100.0)
Platelets: 273 10*3/uL (ref 150–400)
RBC: 5.4 MIL/uL — ABNORMAL HIGH (ref 3.87–5.11)
RDW: 14.9 % (ref 11.5–15.5)
WBC: 8.9 10*3/uL (ref 4.0–10.5)

## 2017-09-14 LAB — URINALYSIS, ROUTINE W REFLEX MICROSCOPIC
Bilirubin Urine: NEGATIVE
Glucose, UA: NEGATIVE mg/dL
Hgb urine dipstick: NEGATIVE
Ketones, ur: NEGATIVE mg/dL
Leukocytes, UA: NEGATIVE
Nitrite: NEGATIVE
Protein, ur: NEGATIVE mg/dL
Specific Gravity, Urine: 1.013 (ref 1.005–1.030)
pH: 6 (ref 5.0–8.0)

## 2017-09-14 LAB — WET PREP, GENITAL
Clue Cells Wet Prep HPF POC: NONE SEEN
Sperm: NONE SEEN
Trich, Wet Prep: NONE SEEN
WBC, Wet Prep HPF POC: NONE SEEN
Yeast Wet Prep HPF POC: NONE SEEN

## 2017-09-14 LAB — LIPASE, BLOOD: Lipase: 42 U/L (ref 11–51)

## 2017-09-14 LAB — I-STAT TROPONIN, ED: Troponin i, poc: 0 ng/mL (ref 0.00–0.08)

## 2017-09-14 LAB — I-STAT BETA HCG BLOOD, ED (MC, WL, AP ONLY): I-stat hCG, quantitative: <5 m[IU]/mL (ref <5)

## 2017-09-14 MED ORDER — MORPHINE SULFATE (PF) 4 MG/ML IV SOLN
4.0000 mg | Freq: Once | INTRAVENOUS | Status: AC
  Administered 2017-09-14: 4 mg via INTRAVENOUS
  Filled 2017-09-14: qty 1

## 2017-09-14 MED ORDER — ONDANSETRON HCL 4 MG/2ML IJ SOLN
4.0000 mg | Freq: Once | INTRAMUSCULAR | Status: AC
  Administered 2017-09-14: 4 mg via INTRAVENOUS
  Filled 2017-09-14: qty 2

## 2017-09-14 MED ORDER — HYDROMORPHONE HCL 1 MG/ML IJ SOLN
1.0000 mg | Freq: Once | INTRAMUSCULAR | Status: AC
  Administered 2017-09-14: 1 mg via INTRAVENOUS
  Filled 2017-09-14: qty 1

## 2017-09-14 MED ORDER — CEFTRIAXONE SODIUM 250 MG IJ SOLR
250.0000 mg | Freq: Once | INTRAMUSCULAR | Status: AC
  Administered 2017-09-14: 250 mg via INTRAMUSCULAR
  Filled 2017-09-14: qty 250

## 2017-09-14 MED ORDER — STERILE WATER FOR INJECTION IJ SOLN
INTRAMUSCULAR | Status: AC
  Administered 2017-09-14: 18:00:00
  Filled 2017-09-14: qty 10

## 2017-09-14 MED ORDER — ONDANSETRON 4 MG PO TBDP: 4.0000 mg | ORAL_TABLET | Freq: Three times a day (TID) | ORAL | 0 refills | Status: DC | PRN

## 2017-09-14 MED ORDER — AZITHROMYCIN 250 MG PO TABS
1000.0000 mg | ORAL_TABLET | Freq: Once | ORAL | Status: AC
  Administered 2017-09-14: 1000 mg via ORAL
  Filled 2017-09-14 (×2): qty 4

## 2017-09-14 NOTE — ED Triage Notes (Addendum)
Pt presents to eD for assessment of lower abdominal pain, chronic back pain, shortness of breath (hx of sarcoidosis), nausea, loose stools.  Denies changes in urination.  Denies vomiting.  Denies CP.  EKG shows RBBB, with a hx of same.  Pt states she has tried home oxy, muscle relaxers, and other prescribed meds without relief.

## 2017-09-14 NOTE — ED Notes (Signed)
Pt ambulated to bathroom steady gait.

## 2017-09-14 NOTE — ED Notes (Signed)
Patient transported to CT 

## 2017-09-14 NOTE — Medical Student Note (Signed)
[place] DEPT Provider Student Note For educational purposes for Medical, PA and NP students only and not part of the legal medical record.   CSN: [id number] Arrival date & time: 09/14/17  1148     History   Chief Complaint Chief Complaint  Patient presents with  . Abdominal Pain    HPI Crystal Bullock is a 52 y.o. female.  She presents today with suprapubic abdominal pain, low back pain and shortness of breath with pain on inspiration. She states that her SOB and chest pain began Friday afternoon. By Friday evening she was feeling like she was "going to start my period" with crampy abdominal pain and mild back pain. She took percocet and Midol without relief. She then tried a muscle relaxer since those medications didn't help with the pain and went to sleep.  When she awoke the abdominal pain had worsened and had become "burning" through her low abdomen and low back. It has not improved since that time. She has 10/10 abdominal pain that "overpowers" the chest pain and makes it hard to move or breathe. She does have a history of chronic back pain with sciatic pain to the right leg. She has a history of sarcoidosis and states that she has seen her rheumatologist this year and was told it was "in remission". Her last menstrual period was in April. She has a history of gallbladder removal, tubal ligation and one other abdominal surgery she couldn't remember. She has no history of blood clots and does not take hormone replacement or birth control. She has a history of reflux for which she takes Nexium. She has a history of diverticulitis with the most recent episode "this time last year". She denies any history of thyroid, liver or kidney disease.      Past Medical History:  Diagnosis Date  . Anxiety 10/06/2015  . Asthma   . Atypical chest pain 10/06/2015  . Fibroid   . Hypertension   . Palpitations 10/06/2015  . RBBB 10/06/2015  . Sarcoidosis   . Scoliosis   . Seasonal  allergies   . Sickle cell trait [facility])     Patient Active Problem List   Diagnosis Date Noted  . OSA (obstructive sleep apnea) 05/13/2017  . Low back pain 02/24/2017  . Sarcoidosis 10/06/2015  . Essential hypertension 10/06/2015  . Asthma 10/06/2015  . Atypical chest pain 10/06/2015  . Anxiety 10/06/2015  . Palpitations 10/06/2015  . RBBB 10/06/2015    Past Surgical History:  Procedure Laterality Date  . AXILLARY LYMPH NODE DISSECTION    . CHOLECYSTECTOMY      OB History   None      Home Medications    Prior to Admission medications   Medication Sig Start Date End Date Taking? Authorizing Provider  albuterol (PROVENTIL HFA;VENTOLIN HFA) 108 (90 Base) MCG/ACT inhaler Inhale 2 puffs into the lungs every 6 (six) hours as needed for wheezing (wheezing). Patient taking differently: Inhale 2 puffs into the lungs every 6 (six) hours as needed for wheezing or shortness of breath.  04/24/17  Yes Dorena Dew, FNP  amLODipine (NORVASC) 10 MG tablet Take 1 tablet (10 mg total) by mouth daily. 04/24/17  Yes Dorena Dew, FNP  Ascorbic Acid (VITAMIN C) 1000 MG tablet Take 1,000 mg by mouth daily.   Yes [provider]  cetirizine (ZYRTEC) 10 MG tablet Take 10 mg by mouth daily as needed for allergies.    Yes [provider]  cyclobenzaprine (FLEXERIL) 10  MG tablet Take 10 mg by mouth daily as needed for muscle spasms.  06/20/17  Yes [provider]  meclizine (ANTIVERT) 12.5 MG tablet Take 1 tablet (12.5 mg total) by mouth 3 (three) times daily as needed for dizziness. 07/18/17  Yes Dorena Dew, FNP  Multiple Vitamins-Minerals (MULTIVITAMINS THER. W/MINERALS) TABS Take 1 tablet by mouth daily.   Yes [provider]  NARCAN 4 MG/0.1ML LIQD nasal spray kit USE 1 SPRAY PRN UTD FOR ACCIDENTAL OVERDOSE 05/07/17  Yes [provider]  oxyCODONE-acetaminophen (PERCOCET) 10-325 MG tablet Take 1 tablet by mouth every 4 (four) hours as needed  for pain.   Yes [provider]  promethazine (PHENERGAN) 25 MG tablet Take 25 mg by mouth every 6 (six) hours as needed for nausea or vomiting.    Yes [provider]  ondansetron (ZOFRAN ODT) 4 MG disintegrating tablet Take 1 tablet (4 mg total) by mouth every 8 (eight) hours as needed for nausea or vomiting. 09/14/17   Joy, Helane Gunther, PA-C    Family History Family History  Problem Relation Age of Onset  . Hypertension Mother   . Diabetes Mother     Social History Social History   Tobacco Use  . Smoking status: Former Research scientist (life sciences)  . Smokeless tobacco: Never Used  Substance Use Topics  . Alcohol use: Yes    Comment: occasional   . Drug use: No     Allergies   Ivp dye [iodinated diagnostic agents]   Review of Systems Review of Systems  Constitutional: Negative for fever.  HENT: Positive for trouble swallowing. Negative for sore throat.   Respiratory: Positive for chest tightness and shortness of breath. Negative for cough and wheezing.   Cardiovascular: Positive for chest pain. Negative for leg swelling.  Gastrointestinal: Positive for abdominal pain and nausea (as of this morning). Negative for blood in stool, constipation and vomiting. Diarrhea: described her stools as "loose"  Genitourinary: Negative for difficulty urinating, dysuria, frequency, hematuria, menstrual problem, pelvic pain, urgency, vaginal bleeding and vaginal discharge.  Musculoskeletal: Positive for back pain. Negative for gait problem.  Neurological: Negative for dizziness, syncope, weakness, light-headedness and numbness.     Physical Exam Updated Vital Signs BP (!) 146/92   Pulse 67   Temp 98.6 F (37 C) (Oral)   Resp 15   SpO2 95%   Physical Exam  Constitutional: She is oriented to person, place, and time.  Appears uncomfortable but in no distress  HENT:  Head: Normocephalic and atraumatic.  Mouth/Throat: Oropharynx is clear and moist.  Eyes: Pupils are equal, round, and  reactive to light. EOM are normal. No scleral icterus.  Neck: Full passive range of motion without pain. Thyromegaly present.  Cardiovascular: Normal rate and regular rhythm. Exam reveals no gallop and no friction rub.  No murmur heard. Pulmonary/Chest: Breath sounds normal. No stridor. No respiratory distress. She has no wheezes. She has no rhonchi. She has no rales. She exhibits tenderness.  Abdominal: Soft. She exhibits no distension and no fluid wave. Hepatosplenomegaly: Difficult to assess due to body habitus. There is tenderness (Generalized tenderness worse in RLQ, LLQ, suprapubid).  Genitourinary: Vagina normal. Uterus is tender. Cervix exhibits no friability. Cervical motion tenderness: mild CMT. Right adnexum displays tenderness. Left adnexum displays tenderness. Vaginal discharge: normal mucus discharge.  Musculoskeletal:       Cervical back: Normal.       Thoracic back: Normal.       Lumbar back: She exhibits tenderness and pain. She  exhibits no swelling and no deformity.       Legs:      Right foot: Normal.       Left foot: Normal.  Neurological: She is alert and oriented to person, place, and time. She has normal strength.  Skin: No rash noted. She is not diaphoretic. No cyanosis or erythema. No pallor.     ED Treatments / Results  Labs (all labs ordered are listed, but only abnormal results are displayed) Labs Reviewed  COMPREHENSIVE METABOLIC PANEL - Abnormal; Notable for the following components:      Result Value   Creatinine, Ser 1.02 (*)    All other components within normal limits  CBC - Abnormal; Notable for the following components:   RBC 5.40 (*)    MCV 74.8 (*)    MCH 25.4 (*)    All other components within normal limits  WET PREP, GENITAL  LIPASE, BLOOD  URINALYSIS, ROUTINE W REFLEX MICROSCOPIC  I-STAT BETA HCG BLOOD, ED ([facility], [facility], [facility] ONLY)  I-STAT TROPONIN, ED  GC/CHLAMYDIA PROBE AMP ([place]) NOT AT [facility]    EKG None Radiology Ct Abdomen Pelvis  Wo Contrast  Result Date: 09/14/2017 CLINICAL DATA:  Lower abdominal pain, nausea, and loose stool. EXAM: CT ABDOMEN AND PELVIS WITHOUT CONTRAST TECHNIQUE: Multidetector CT imaging of the abdomen and pelvis was performed following the standard protocol without IV contrast. COMPARISON:  09/12/2015 FINDINGS: Lower chest: No acute findings. Hepatobiliary: No mass visualized on this unenhanced exam. Prior cholecystectomy. No evidence of biliary obstruction. Pancreas: No mass or inflammatory process visualized on this unenhanced exam. Spleen:  Within normal limits in size. Adrenals/Urinary tract: No evidence of urolithiasis or hydronephrosis. Unremarkable unopacified urinary bladder. Stomach/Bowel: No evidence of obstruction, inflammatory process, or abnormal fluid collections. Diverticulosis is seen mainly involving the descending and sigmoid colon, however there is no evidence of diverticulitis. Vascular/Lymphatic: No pathologically enlarged lymph nodes identified. No evidence of abdominal aortic aneurysm. Reproductive:  No mass or other significant abnormality. Other:  None. Musculoskeletal:  No suspicious bone lesions identified. IMPRESSION: No acute findings. Colonic diverticulosis, without radiographic evidence of diverticulitis. Electronically Signed   By: Earle Gell M.D.   On: 09/14/2017 15:35    Procedures Procedures (including critical care time)  Medications Ordered in ED Medications  morphine 4 MG/ML injection 4 mg (4 mg Intravenous Given 09/14/17 1421)  ondansetron (ZOFRAN) injection 4 mg (4 mg Intravenous Given 09/14/17 1419)  HYDROmorphone (DILAUDID) injection 1 mg (1 mg Intravenous Given 09/14/17 1550)  cefTRIAXone (ROCEPHIN) injection 250 mg (250 mg Intramuscular Given 09/14/17 1716)  azithromycin (ZITHROMAX) tablet 1,000 mg (1,000 mg Oral Given 09/14/17 1712)  sterile water (preservative free) injection (  Given 09/14/17 1741)     Initial Impression / Assessment and Plan / ED Course  I have  reviewed the triage vital signs and the nursing notes.  Pertinent labs & imaging results that were available during my care of the patient were reviewed by me and considered in my medical decision making (see chart for details).  Send CBC, CMP, Troponin, UA, Lipase and TSH. Obtain EKG. Due to normal HR, good O2 saturation and no s/s of DVT, Pulmonary embolism is unlikely. Obtain abdominal CT with IV contrast when kidney function cleared.  Control pain with 5 mg IV Morphine, give 4 mg IV Zofran for nausea. Will reevaluate.  Pelvic exam and CT scan of the abdomen show no abnormalities. Will treat empirically for PID due to adnexal and cervical motion tenderness. Take prescriptions  as written until completed.  cefTRIAXone (ROCEPHIN) injection 250 mg (250 mg Intramuscular Given 09/14/17 1716)  azithromycin (ZITHROMAX) tablet 1,000 mg (1,000 mg Oral Given 09/14/17 1712)   Recommend follow up with OBGYN within the next week.   Final Clinical Impressions(s) / ED Diagnoses   Final diagnoses:  Lower abdominal pain    New Prescriptions Discharge Medication List as of 09/14/2017  5:32 PM    START taking these medications   Details  ondansetron (ZOFRAN ODT) 4 MG disintegrating tablet Take 1 tablet (4 mg total) by mouth every 8 (eight) hours as needed for nausea or vomiting., Starting Sun 09/14/2017, Normal

## 2017-09-14 NOTE — Discharge Instructions (Addendum)
Imaging and lab results were reassuring. May continue to take your home pain medication. Continue to stay well-hydrated by drinking plenty water. May take the Zofran, as needed, for nausea/vomiting. Follow-up with OB/GYN on this matter. Return to the ED should symptoms worsen.

## 2017-09-14 NOTE — ED Notes (Signed)
Pt given crackers and ginger ale per [employer]

## 2017-09-14 NOTE — ED Notes (Signed)
Pt alert and oriented. Pt verbalized understanding of discharge instructions. She plans to see ob/gyn first thing Monday.

## 2017-09-14 NOTE — ED Provider Notes (Signed)
[place] EMERGENCY DEPARTMENT Provider Note   CSN: [id number] Arrival date & time: 09/14/17  1148     History   Chief Complaint Chief Complaint  Patient presents with  . Abdominal Pain    HPI Crystal Bullock is a 52 y.o. female.  HPI   Crystal Bullock is a 52 y.o. female, with a history of anxiety, asthma, chronic back pain, sarcoidosis, HTN, and RBBB, presenting to the ED with suprapubic abdominal pain beginning 2 days ago.  States, "it feels just like it did when I was having my monthly cycle."  Patient is perimenopausal and her last cycle was April 2019.  Pain is cramping, 10/10, radiating to the bilateral lower quadrants and around to the back with no radiation into the chest or legs.  Additionally endorses 2-3 loose stools a day and nausea. She has taken her home pain medication without improvement. Denies fever/chills, vomiting, hematochezia/melena, abnormal vaginal discharge, vaginal bleeding, urinary symptoms, or any other complaints.   Past Medical History:  Diagnosis Date  . Anxiety 10/06/2015  . Asthma   . Atypical chest pain 10/06/2015  . Fibroid   . Hypertension   . Palpitations 10/06/2015  . RBBB 10/06/2015  . Sarcoidosis   . Scoliosis   . Seasonal allergies   . Sickle cell trait [facility])     Patient Active Problem List   Diagnosis Date Noted  . OSA (obstructive sleep apnea) 05/13/2017  . Low back pain 02/24/2017  . Sarcoidosis 10/06/2015  . Essential hypertension 10/06/2015  . Asthma 10/06/2015  . Atypical chest pain 10/06/2015  . Anxiety 10/06/2015  . Palpitations 10/06/2015  . RBBB 10/06/2015    Past Surgical History:  Procedure Laterality Date  . AXILLARY LYMPH NODE DISSECTION    . CHOLECYSTECTOMY       OB History   None      Home Medications    Prior to Admission medications   Medication Sig Start Date End Date Taking? Authorizing Provider  albuterol (PROVENTIL HFA;VENTOLIN HFA) 108 (90 Base) MCG/ACT  inhaler Inhale 2 puffs into the lungs every 6 (six) hours as needed for wheezing (wheezing). Patient taking differently: Inhale 2 puffs into the lungs every 6 (six) hours as needed for wheezing or shortness of breath.  04/24/17  Yes Dorena Dew, FNP  amLODipine (NORVASC) 10 MG tablet Take 1 tablet (10 mg total) by mouth daily. 04/24/17  Yes Dorena Dew, FNP  Ascorbic Acid (VITAMIN C) 1000 MG tablet Take 1,000 mg by mouth daily.   Yes [provider]  cetirizine (ZYRTEC) 10 MG tablet Take 10 mg by mouth daily as needed for allergies.    Yes [provider]  cyclobenzaprine (FLEXERIL) 10 MG tablet Take 10 mg by mouth daily as needed for muscle spasms.  06/20/17  Yes [provider]  meclizine (ANTIVERT) 12.5 MG tablet Take 1 tablet (12.5 mg total) by mouth 3 (three) times daily as needed for dizziness. 07/18/17  Yes Dorena Dew, FNP  Multiple Vitamins-Minerals (MULTIVITAMINS THER. W/MINERALS) TABS Take 1 tablet by mouth daily.   Yes [provider]  NARCAN 4 MG/0.1ML LIQD nasal spray kit USE 1 SPRAY PRN UTD FOR ACCIDENTAL OVERDOSE 05/07/17  Yes [provider]  oxyCODONE-acetaminophen (PERCOCET) 10-325 MG tablet Take 1 tablet by mouth every 4 (four) hours as needed for pain.   Yes [provider]  promethazine (PHENERGAN) 25 MG tablet Take 25 mg by mouth every 6 (six) hours as needed for nausea  or vomiting.    Yes [provider]  ondansetron (ZOFRAN ODT) 4 MG disintegrating tablet Take 1 tablet (4 mg total) by mouth every 8 (eight) hours as needed for nausea or vomiting. 09/14/17   Shabreka Coulon, Helane Gunther, PA-C    Family History Family History  Problem Relation Age of Onset  . Hypertension Mother   . Diabetes Mother     Social History Social History   Tobacco Use  . Smoking status: Former Research scientist (life sciences)  . Smokeless tobacco: Never Used  Substance Use Topics  . Alcohol use: Yes    Comment: occasional   . Drug use: No     Allergies    Ivp dye [iodinated diagnostic agents]   Review of Systems Review of Systems  Constitutional: Negative for chills, diaphoresis and fever.  Gastrointestinal: Positive for abdominal pain and nausea. Negative for blood in stool, constipation, diarrhea and vomiting.  Genitourinary: Negative for dysuria, frequency, hematuria, vaginal bleeding and vaginal discharge.  Neurological: Negative for weakness and numbness.  All other systems reviewed and are negative.    Physical Exam Updated Vital Signs BP (!) 146/92   Pulse 67   Temp 98.6 F (37 C) (Oral)   Resp 15   SpO2 95%   Physical Exam  Constitutional: She appears well-developed and well-nourished. No distress.  HENT:  Head: Normocephalic and atraumatic.  Eyes: Conjunctivae are normal.  Neck: Neck supple.  Cardiovascular: Normal rate, regular rhythm, normal heart sounds and intact distal pulses.  Pulmonary/Chest: Effort normal and breath sounds normal. No respiratory distress.  Abdominal: Soft. There is tenderness in the right lower quadrant, suprapubic area and left lower quadrant. There is no guarding.  When alone in the room, she does not appear to be in distress, talking on the phone  Genitourinary:  Genitourinary Comments: External genitalia normal Vagina without discharge Cervix  normal negative for cervical motion tenderness Tenderness throughout the lower pelvis. Bladder palpated negative for tenderness Uterus palpated no masses, positive for tenderness  No inguinal lymphadenopathy. Otherwise normal female genitalia. PA Student, Brenton Grills, served as chaperone during exam.  Musculoskeletal: She exhibits no edema.       Back:  Lymphadenopathy:    She has no cervical adenopathy.  Neurological: She is alert.  Skin: Skin is warm and dry. She is not diaphoretic.  Psychiatric: She has a normal mood and affect. Her behavior is normal.  Nursing note and vitals reviewed.    ED Treatments / Results  Labs (all labs  ordered are listed, but only abnormal results are displayed) Labs Reviewed  COMPREHENSIVE METABOLIC PANEL - Abnormal; Notable for the following components:      Result Value   Creatinine, Ser 1.02 (*)    All other components within normal limits  CBC - Abnormal; Notable for the following components:   RBC 5.40 (*)    MCV 74.8 (*)    MCH 25.4 (*)    All other components within normal limits  WET PREP, GENITAL  LIPASE, BLOOD  URINALYSIS, ROUTINE W REFLEX MICROSCOPIC  I-STAT BETA HCG BLOOD, ED ([facility], [facility], [facility] ONLY)  I-STAT TROPONIN, ED  GC/CHLAMYDIA PROBE AMP ([place]) NOT AT [facility]    EKG EKG Interpretation  Date/Time:  Sunday September 14 2017 11:57:00 EDT Ventricular Rate:  70 PR Interval:  160 QRS Duration: 146 QT Interval:  444 QTC Calculation: 479 R Axis:   86 Text Interpretation:  Normal sinus rhythm Right bundle branch block Abnormal ECG no sig change from previous Confirmed by Pfeiffer,  Jeannie Done [phone]) on 09/14/2017 4:56:02 PM   Radiology Ct Abdomen Pelvis Wo Contrast  Result Date: 09/14/2017 CLINICAL DATA:  Lower abdominal pain, nausea, and loose stool. EXAM: CT ABDOMEN AND PELVIS WITHOUT CONTRAST TECHNIQUE: Multidetector CT imaging of the abdomen and pelvis was performed following the standard protocol without IV contrast. COMPARISON:  09/12/2015 FINDINGS: Lower chest: No acute findings. Hepatobiliary: No mass visualized on this unenhanced exam. Prior cholecystectomy. No evidence of biliary obstruction. Pancreas: No mass or inflammatory process visualized on this unenhanced exam. Spleen:  Within normal limits in size. Adrenals/Urinary tract: No evidence of urolithiasis or hydronephrosis. Unremarkable unopacified urinary bladder. Stomach/Bowel: No evidence of obstruction, inflammatory process, or abnormal fluid collections. Diverticulosis is seen mainly involving the descending and sigmoid colon, however there is no evidence of diverticulitis. Vascular/Lymphatic: No pathologically  enlarged lymph nodes identified. No evidence of abdominal aortic aneurysm. Reproductive:  No mass or other significant abnormality. Other:  None. Musculoskeletal:  No suspicious bone lesions identified. IMPRESSION: No acute findings. Colonic diverticulosis, without radiographic evidence of diverticulitis. Electronically Signed   By: Earle Gell M.D.   On: 09/14/2017 15:35    Procedures Procedures (including critical care time)  Medications Ordered in ED Medications  morphine 4 MG/ML injection 4 mg (4 mg Intravenous Given 09/14/17 1421)  ondansetron (ZOFRAN) injection 4 mg (4 mg Intravenous Given 09/14/17 1419)  HYDROmorphone (DILAUDID) injection 1 mg (1 mg Intravenous Given 09/14/17 1550)  cefTRIAXone (ROCEPHIN) injection 250 mg (250 mg Intramuscular Given 09/14/17 1716)  azithromycin (ZITHROMAX) tablet 1,000 mg (1,000 mg Oral Given 09/14/17 1712)  sterile water (preservative free) injection (  Given 09/14/17 1741)     Initial Impression / Assessment and Plan / ED Course  I have reviewed the triage vital signs and the nursing notes.  Pertinent labs & imaging results that were available during my care of the patient were reviewed by me and considered in my medical decision making (see chart for details).   Patient presents with lower abdominal pain. Patient is nontoxic appearing, afebrile, not tachycardic, not tachypneic, not hypotensive, maintains adequate SPO2 on room air.  No acute abnormalities on CT.  Follow-up abdominal exam benign.  OB/GYN follow-up.  Patient voices understanding of these instructions, accepts the plan, and is comfortable with discharge.    Findings and plan of care discussed with Charlesetta Shanks, MD.   Vitals:   09/14/17 1155 09/14/17 1221 09/14/17 1223 09/14/17 1300  BP: (!) 146/91 (!) 147/97 (!) 147/97 (!) 160/94  Pulse: 75 69 73 67  Resp: 16 19 (!) 21 13  Temp: 98.8 F (37.1 C)  98.6 F (37 C)   TempSrc: Oral  Oral   SpO2: 96% 99% 98% 100%     Final Clinical  Impressions(s) / ED Diagnoses   Final diagnoses:  Lower abdominal pain    ED Discharge Orders        Ordered    ondansetron (ZOFRAN ODT) 4 MG disintegrating tablet  Every 8 hours PRN     09/14/17 1732       Lorayne Bender, PA-C 09/14/17 1919    Charlesetta Shanks, MD 09/30/17 [phone]

## 2017-09-15 LAB — GC/CHLAMYDIA PROBE AMP (~~LOC~~) NOT AT ARMC
Chlamydia: NEGATIVE
Neisseria Gonorrhea: NEGATIVE

## 2017-09-16 ENCOUNTER — Telehealth: Payer: Self-pay | Admitting: General Practice

## 2017-09-16 NOTE — Telephone Encounter (Signed)
Patient called and left message on nurse voicemail line stating she would like a call back about her abdominal pain. Patient states her next appt isn't until Monday and she is really hurting. Patient wants to know if we can do anything for her. Per chart review, patient has never been to this office before or has an appt in the future here. Called patient & discussed that I think she called the wrong doctor's office as she isn't a patient here. Patient asked if this was the hospital. Told patient this is an OB GYN office within the hospital. Patient states she cannot get in with her OB GYN until Monday and this fibroid is causing some serious pain. Discussed with patient calling her doctor's office for a possible sooner appt. Told patient we couldn't get here in here as a new patient until September. Patient verbalized understanding & had no questions.

## 2017-09-17 ENCOUNTER — Ambulatory Visit (INDEPENDENT_AMBULATORY_CARE_PROVIDER_SITE_OTHER): Payer: Medicare Other | Admitting: Family Medicine

## 2017-09-17 ENCOUNTER — Encounter: Payer: Self-pay | Admitting: Family Medicine

## 2017-09-17 VITALS — BP 146/91 | HR 74 | Temp 98.3°F | Resp 16 | Ht 67.5 in | Wt 249.0 lb

## 2017-09-17 DIAGNOSIS — I10 Essential (primary) hypertension: Secondary | ICD-10-CM

## 2017-09-17 DIAGNOSIS — K219 Gastro-esophageal reflux disease without esophagitis: Secondary | ICD-10-CM | POA: Diagnosis not present

## 2017-09-17 DIAGNOSIS — R1032 Left lower quadrant pain: Secondary | ICD-10-CM | POA: Diagnosis not present

## 2017-09-17 MED ORDER — KETOROLAC TROMETHAMINE 60 MG/2ML IM SOLN
60.0000 mg | Freq: Once | INTRAMUSCULAR | Status: AC
  Administered 2017-09-17: 60 mg via INTRAMUSCULAR

## 2017-09-17 MED ORDER — AMLODIPINE BESYLATE 10 MG PO TABS: 10.0000 mg | ORAL_TABLET | Freq: Every day | ORAL | 1 refills | Status: DC

## 2017-09-17 MED ORDER — ESOMEPRAZOLE MAGNESIUM 40 MG PO CPDR: 40.0000 mg | DELAYED_RELEASE_CAPSULE | Freq: Every day | ORAL | 3 refills | Status: DC

## 2017-09-17 NOTE — Progress Notes (Signed)
Subjective:  Crystal Bullock is a 52 y.o. female with hypertension.  Hypertension ROS: taking medications as instructed, no medication side effects noted, no TIA's, no chest pain on exertion, no dyspnea on exertion and no swelling of ankles.  New concerns: Patient seen in the ED and diagnosed with fibroids. Patient states that she has scheduled appt with OBGYN and she can not be seen until Monday (5 days). Patient is 10/10 left lower quadrant. States that NSAID give her "restless legs at night".   Has oral medications at home. Using oxy and muscle relaxers without relief.  Objective:  BP (!) 146/91 (BP Location: Left Arm, Patient Position: Sitting, Cuff Size: Large)   Pulse 74   Temp 98.3 F (36.8 C) (Oral)   Resp 16   Ht 5' 7.5" (1.715 m)   Wt 249 lb (112.9 kg)   SpO2 100%   BMI 38.42 kg/m   Appearance alertand in no distress, overweight, well hydrated, ill-appearing and in pain due to LLQ pain General exam BP noted to be mildly elevated today in office, S1, S2 normal, no gallop, no murmur, chest clear, no JVD, no HSM, no edema. There is TTP of the left lower quadrant. Body habitus limits exam. Bowel Sounds normal in all 4 quadrants.  Lab review: labs are reviewed, up to date and normal, CT of Abdomen reviewed. No mass or abnormalities noted.      Component Value Date/Time   WBC 8.9 09/14/2017 1159   RBC 5.40 (H) 09/14/2017 1159   HGB 13.7 09/14/2017 1159   HGB 12.0 07/18/2017 0920   HCT 40.4 09/14/2017 1159   HCT 36.4 07/18/2017 0920   PLT 273 09/14/2017 1159   PLT 285 07/18/2017 0920      Component Value Date/Time   NA 137 09/14/2017 1159   NA 139 07/18/2017 0920   K 3.8 09/14/2017 1159   CL 100 09/14/2017 1159   CO2 26 09/14/2017 1159   BUN 10 09/14/2017 1159   BUN 18 07/18/2017 0920   CREATININE 1.02 (H) 09/14/2017 1159   CALCIUM 9.4 09/14/2017 1159   ALKPHOS 52 09/14/2017 1159   AST 24 09/14/2017 1159   ALT 22 09/14/2017 1159   BILITOT 0.7 09/14/2017 1159   BILITOT <0.2 07/18/2017 0920    Assessment/Plan:  1. Essential hypertension Continue with current medications - amLODipine (NORVASC) 10 MG tablet; Take 1 tablet (10 mg total) by mouth daily.  Dispense: 90 tablet; Refill: 1  2. Left lower quadrant pain Follow with OBGYN. Ordered US today.  - US PELVIC COMPLETE WITH TRANSVAGINAL; Future - ketorolac (TORADOL) injection 60 mg   3. Gastroesophageal reflux disease without esophagitis Refilled medications. Weight loss and diet recommended.  - esomeprazole (NEXIUM) 40 MG capsule; Take 1 capsule (40 mg total) by mouth daily.  Dispense: 30 capsule; Refill: 3  Current treatment plan is effective, no change in therapy. Lab results and schedule of future lab studies reviewed with patient. Orders and follow up as documented in patient record. Reviewed diet, exercise and weight control. Recommended sodium restriction..   The patient was given clear instructions to go to ER or return to medical center if symptoms do not improve, worsen or new problems develop. The patient verbalized understanding and agreed with plan of care.   Ms. Doug Sou. Nathaneil Canary, FNP-BC Patient [facility] [address] [place], [place] [address] [phone]

## 2017-09-17 NOTE — Patient Instructions (Signed)
Hypertension Hypertension is another name for high blood pressure. High blood pressure forces your heart to work harder to pump blood. This can cause problems over time. There are two numbers in a blood pressure reading. There is a top number (systolic) over a bottom number (diastolic). It is best to have a blood pressure below 120/80. Healthy choices can help lower your blood pressure. You may need medicine to help lower your blood pressure if:  Your blood pressure cannot be lowered with healthy choices.  Your blood pressure is higher than 130/80.  Follow these instructions at home: Eating and drinking  If directed, follow the DASH eating plan. This diet includes: ? Filling half of your plate at each meal with fruits and vegetables. ? Filling one quarter of your plate at each meal with whole grains. Whole grains include whole wheat pasta, brown rice, and whole grain bread. ? Eating or drinking low-fat dairy products, such as skim milk or low-fat yogurt. ? Filling one quarter of your plate at each meal with low-fat (lean) proteins. Low-fat proteins include fish, skinless chicken, eggs, beans, and tofu. ? Avoiding fatty meat, cured and processed meat, or chicken with skin. ? Avoiding premade or processed food.  Eat less than 1,500 mg of salt (sodium) a day.  Limit alcohol use to no more than 1 drink a day for nonpregnant women and 2 drinks a day for men. One drink equals 12 oz of beer, 5 oz of wine, or 1 oz of hard liquor. Lifestyle  Work with your doctor to stay at a healthy weight or to lose weight. Ask your doctor what the best weight is for you.  Get at least 30 minutes of exercise that causes your heart to beat faster (aerobic exercise) most days of the week. This may include walking, swimming, or biking.  Get at least 30 minutes of exercise that strengthens your muscles (resistance exercise) at least 3 days a week. This may include lifting weights or pilates.  Do not use any  products that contain nicotine or tobacco. This includes cigarettes and e-cigarettes. If you need help quitting, ask your doctor.  Check your blood pressure at home as told by your doctor.  Keep all follow-up visits as told by your doctor. This is important. Medicines  Take over-the-counter and prescription medicines only as told by your doctor. Follow directions carefully.  Do not skip doses of blood pressure medicine. The medicine does not work as well if you skip doses. Skipping doses also puts you at risk for problems.  Ask your doctor about side effects or reactions to medicines that you should watch for. Contact a doctor if:  You think you are having a reaction to the medicine you are taking.  You have headaches that keep coming back (recurring).  You feel dizzy.  You have swelling in your ankles.  You have trouble with your vision. Get help right away if:  You get a very bad headache.  You start to feel confused.  You feel weak or numb.  You feel faint.  You get very bad pain in your: ? Chest. ? Belly (abdomen).  You throw up (vomit) more than once.  You have trouble breathing. Summary  Hypertension is another name for high blood pressure.  Making healthy choices can help lower blood pressure. If your blood pressure cannot be controlled with healthy choices, you may need to take medicine. This information is not intended to replace advice given to you by your health care   provider. Make sure you discuss any questions you have with your health care provider. Document Released: 07/17/2007 Document Revised: 12/27/2015 Document Reviewed: 12/27/2015 Elsevier Interactive Patient Education  2018 [employer]vier [employer]  DASH Eating Plan DASH stands for "Dietary Approaches to Stop Hypertension." The DASH eating plan is a healthy eating plan that has been shown to reduce high blood pressure (hypertension). It may also reduce your risk for type 2 diabetes, heart disease, and  stroke. The DASH eating plan may also help with weight loss. What are tips for following this plan? General guidelines  Avoid eating more than 2,300 mg (milligrams) of salt (sodium) a day. If you have hypertension, you may need to reduce your sodium intake to 1,500 mg a day.  Limit alcohol intake to no more than 1 drink a day for nonpregnant women and 2 drinks a day for men. One drink equals 12 oz of beer, 5 oz of wine, or 1 oz of hard liquor.  Work with your health care provider to maintain a healthy body weight or to lose weight. Ask what an ideal weight is for you.  Get at least 30 minutes of exercise that causes your heart to beat faster (aerobic exercise) most days of the week. Activities may include walking, swimming, or biking.  Work with your health care provider or diet and nutrition specialist (dietitian) to adjust your eating plan to your individual calorie needs. Reading food labels  Check food labels for the amount of sodium per serving. Choose foods with less than 5 percent of the Daily Value of sodium. Generally, foods with less than 300 mg of sodium per serving fit into this eating plan.  To find whole grains, look for the word "whole" as the first word in the ingredient list. Shopping  Buy products labeled as "low-sodium" or "no salt added."  Buy fresh foods. Avoid canned foods and premade or frozen meals. Cooking  Avoid adding salt when cooking. Use salt-free seasonings or herbs instead of table salt or sea salt. Check with your health care provider or pharmacist before using salt substitutes.  Do not fry foods. Cook foods using healthy methods such as baking, boiling, grilling, and broiling instead.  Cook with heart-healthy oils, such as olive, canola, soybean, or sunflower oil. Meal planning   Eat a balanced diet that includes: ? 5 or more servings of fruits and vegetables each day. At each meal, try to fill half of your plate with fruits and vegetables. ? Up  to 6-8 servings of whole grains each day. ? Less than 6 oz of lean meat, poultry, or fish each day. A 3-oz serving of meat is about the same size as a deck of cards. One egg equals 1 oz. ? 2 servings of low-fat dairy each day. ? A serving of nuts, seeds, or beans 5 times each week. ? Heart-healthy fats. Healthy fats called Omega-3 fatty acids are found in foods such as flaxseeds and coldwater fish, like sardines, salmon, and mackerel.  Limit how much you eat of the following: ? Canned or prepackaged foods. ? Food that is high in trans fat, such as fried foods. ? Food that is high in saturated fat, such as fatty meat. ? Sweets, desserts, sugary drinks, and other foods with added sugar. ? Full-fat dairy products.  Do not salt foods before eating.  Try to eat at least 2 vegetarian meals each week.  Eat more home-cooked food and less restaurant, buffet, and fast food.  When eating at a restaurant,   ask that your food be prepared with less salt or no salt, if possible. What foods are recommended? The items listed may not be a complete list. Talk with your dietitian about what dietary choices are best for you. Grains Whole-grain or whole-wheat bread. Whole-grain or whole-wheat pasta. Brown rice. Oatmeal. Quinoa. Bulgur. Whole-grain and low-sodium cereals. Pita bread. Low-fat, low-sodium crackers. Whole-wheat flour tortillas. Vegetables Fresh or frozen vegetables (raw, steamed, roasted, or grilled). Low-sodium or reduced-sodium tomato and vegetable juice. Low-sodium or reduced-sodium tomato sauce and tomato paste. Low-sodium or reduced-sodium canned vegetables. Fruits All fresh, dried, or frozen fruit. Canned fruit in natural juice (without added sugar). Meat and other protein foods Skinless chicken or turkey. Ground chicken or turkey. Pork with fat trimmed off. Fish and seafood. Egg whites. Dried beans, peas, or lentils. Unsalted nuts, nut butters, and seeds. Unsalted canned beans. Lean cuts of  beef with fat trimmed off. Low-sodium, lean deli meat. Dairy Low-fat (1%) or fat-free (skim) milk. Fat-free, low-fat, or reduced-fat cheeses. Nonfat, low-sodium ricotta or cottage cheese. Low-fat or nonfat yogurt. Low-fat, low-sodium cheese. Fats and oils Soft margarine without trans fats. Vegetable oil. Low-fat, reduced-fat, or light mayonnaise and salad dressings (reduced-sodium). Canola, safflower, olive, soybean, and sunflower oils. Avocado. Seasoning and other foods Herbs. Spices. Seasoning mixes without salt. Unsalted popcorn and pretzels. Fat-free sweets. What foods are not recommended? The items listed may not be a complete list. Talk with your dietitian about what dietary choices are best for you. Grains Baked goods made with fat, such as croissants, muffins, or some breads. Dry pasta or rice meal packs. Vegetables Creamed or fried vegetables. Vegetables in a cheese sauce. Regular canned vegetables (not low-sodium or reduced-sodium). Regular canned tomato sauce and paste (not low-sodium or reduced-sodium). Regular tomato and vegetable juice (not low-sodium or reduced-sodium). Pickles. Olives. Fruits Canned fruit in a light or heavy syrup. Fried fruit. Fruit in cream or butter sauce. Meat and other protein foods Fatty cuts of meat. Ribs. Fried meat. Bacon. Sausage. Bologna and other processed lunch meats. Salami. Fatback. Hotdogs. Bratwurst. Salted nuts and seeds. Canned beans with added salt. Canned or smoked fish. Whole eggs or egg yolks. Chicken or turkey with skin. Dairy Whole or 2% milk, cream, and half-and-half. Whole or full-fat cream cheese. Whole-fat or sweetened yogurt. Full-fat cheese. Nondairy creamers. Whipped toppings. Processed cheese and cheese spreads. Fats and oils Butter. Stick margarine. Lard. Shortening. Ghee. Bacon fat. Tropical oils, such as coconut, palm kernel, or palm oil. Seasoning and other foods Salted popcorn and pretzels. Onion salt, garlic salt, seasoned  salt, table salt, and sea salt. Worcestershire sauce. Tartar sauce. Barbecue sauce. Teriyaki sauce. Soy sauce, including reduced-sodium. Steak sauce. Canned and packaged gravies. Fish sauce. Oyster sauce. Cocktail sauce. Horseradish that you find on the shelf. Ketchup. Mustard. Meat flavorings and tenderizers. Bouillon cubes. Hot sauce and Tabasco sauce. Premade or packaged marinades. Premade or packaged taco seasonings. Relishes. Regular salad dressings. Where to find more information:  National Heart, Lung, and Blood Institute: www.nhlbi.nih.gov  American Heart Association: www.heart.org Summary  The DASH eating plan is a healthy eating plan that has been shown to reduce high blood pressure (hypertension). It may also reduce your risk for type 2 diabetes, heart disease, and stroke.  With the DASH eating plan, you should limit salt (sodium) intake to 2,300 mg a day. If you have hypertension, you may need to reduce your sodium intake to 1,500 mg a day.  When on the DASH eating plan, aim to eat more fresh   fruits and vegetables, whole grains, lean proteins, low-fat dairy, and heart-healthy fats.  Work with your health care provider or diet and nutrition specialist (dietitian) to adjust your eating plan to your individual calorie needs. This information is not intended to replace advice given to you by your health care provider. Make sure you discuss any questions you have with your health care provider. Document Released: 01/17/2011 Document Revised: 01/22/2016 Document Reviewed: 01/22/2016 Elsevier Interactive Patient Education  2018 Elsevier Inc.  

## 2017-09-18 ENCOUNTER — Ambulatory Visit (HOSPITAL_COMMUNITY)
Admission: RE | Admit: 2017-09-18 | Discharge: 2017-09-18 | Disposition: A | Discharge status: Home / Self Care | Payer: Medicare Other | Source: Ambulatory Visit | Attending: Family Medicine | Admitting: Family Medicine

## 2017-09-18 ENCOUNTER — Telehealth (INDEPENDENT_AMBULATORY_CARE_PROVIDER_SITE_OTHER): Payer: Self-pay | Admitting: Orthopaedic Surgery

## 2017-09-18 DIAGNOSIS — D252 Subserosal leiomyoma of uterus: Secondary | ICD-10-CM | POA: Diagnosis not present

## 2017-09-18 DIAGNOSIS — R9389 Abnormal findings on diagnostic imaging of other specified body structures: Secondary | ICD-10-CM

## 2017-09-18 DIAGNOSIS — R1032 Left lower quadrant pain: Secondary | ICD-10-CM | POA: Insufficient documentation

## 2017-09-18 DIAGNOSIS — D219 Benign neoplasm of connective and other soft tissue, unspecified: Secondary | ICD-10-CM

## 2017-09-18 NOTE — Telephone Encounter (Signed)
I received VM from [place] @ [facility] checking status of request for records. I called her back and left msg advising that we did not receive their request and to please re fax. I will send records upon receipt of request

## 2017-09-22 ENCOUNTER — Ambulatory Visit: Payer: Self-pay | Admitting: Family Medicine

## 2017-09-22 DIAGNOSIS — R198 Other specified symptoms and signs involving the digestive system and abdomen: Secondary | ICD-10-CM | POA: Diagnosis not present

## 2017-09-22 DIAGNOSIS — R1032 Left lower quadrant pain: Secondary | ICD-10-CM | POA: Diagnosis not present

## 2017-09-24 DIAGNOSIS — R198 Other specified symptoms and signs involving the digestive system and abdomen: Secondary | ICD-10-CM | POA: Diagnosis not present

## 2017-09-24 DIAGNOSIS — R1032 Left lower quadrant pain: Secondary | ICD-10-CM | POA: Diagnosis not present

## 2017-09-24 DIAGNOSIS — Z8719 Personal history of other diseases of the digestive system: Secondary | ICD-10-CM | POA: Diagnosis not present

## 2017-09-24 DIAGNOSIS — K6389 Other specified diseases of intestine: Secondary | ICD-10-CM | POA: Diagnosis not present

## 2017-09-25 NOTE — Telephone Encounter (Signed)
Need referral to OBGYN. Fibroids and 14 mm thick endometrial tissue.

## 2017-09-25 NOTE — Telephone Encounter (Signed)
-----   Message from Lanae Boast, [place] sent at 09/24/2017  5:23 PM EDT ----- Regarding: Reminder about Shewanda, Sharpe [MRN: [id number]] Need referral to OBGYN. Fibroids and 14 mm thick endometrial tissue.

## 2017-10-16 DIAGNOSIS — M5136 Other intervertebral disc degeneration, lumbar region: Secondary | ICD-10-CM | POA: Diagnosis not present

## 2017-10-16 DIAGNOSIS — G894 Chronic pain syndrome: Secondary | ICD-10-CM | POA: Diagnosis not present

## 2017-10-16 DIAGNOSIS — Z79899 Other long term (current) drug therapy: Secondary | ICD-10-CM | POA: Diagnosis not present

## 2017-11-14 DIAGNOSIS — G894 Chronic pain syndrome: Secondary | ICD-10-CM | POA: Diagnosis not present

## 2017-11-14 DIAGNOSIS — M5136 Other intervertebral disc degeneration, lumbar region: Secondary | ICD-10-CM | POA: Diagnosis not present

## 2017-11-14 DIAGNOSIS — Z79899 Other long term (current) drug therapy: Secondary | ICD-10-CM | POA: Diagnosis not present

## 2017-12-04 ENCOUNTER — Encounter: Payer: Self-pay | Admitting: Family Medicine

## 2017-12-04 ENCOUNTER — Ambulatory Visit: Payer: Self-pay | Admitting: Family Medicine

## 2017-12-04 ENCOUNTER — Ambulatory Visit (INDEPENDENT_AMBULATORY_CARE_PROVIDER_SITE_OTHER): Payer: Medicare Other | Admitting: Family Medicine

## 2017-12-04 VITALS — BP 130/92 | HR 74 | Temp 98.1°F | Ht 67.5 in | Wt 246.0 lb

## 2017-12-04 DIAGNOSIS — R7303 Prediabetes: Secondary | ICD-10-CM

## 2017-12-04 DIAGNOSIS — K529 Noninfective gastroenteritis and colitis, unspecified: Secondary | ICD-10-CM | POA: Diagnosis not present

## 2017-12-04 DIAGNOSIS — I10 Essential (primary) hypertension: Secondary | ICD-10-CM | POA: Diagnosis not present

## 2017-12-04 DIAGNOSIS — J309 Allergic rhinitis, unspecified: Secondary | ICD-10-CM | POA: Diagnosis not present

## 2017-12-04 DIAGNOSIS — Z8719 Personal history of other diseases of the digestive system: Secondary | ICD-10-CM | POA: Diagnosis not present

## 2017-12-04 DIAGNOSIS — R1032 Left lower quadrant pain: Secondary | ICD-10-CM | POA: Diagnosis not present

## 2017-12-04 LAB — POCT URINALYSIS DIP (MANUAL ENTRY)
Bilirubin, UA: NEGATIVE
Blood, UA: NEGATIVE
Glucose, UA: NEGATIVE mg/dL
Ketones, POC UA: NEGATIVE mg/dL
Leukocytes, UA: NEGATIVE
Nitrite, UA: NEGATIVE
Protein Ur, POC: 30 mg/dL — AB
Spec Grav, UA: 1.015 (ref 1.010–1.025)
Urobilinogen, UA: 0.2 E.U./dL
pH, UA: 7 (ref 5.0–8.0)

## 2017-12-04 LAB — POCT GLYCOSYLATED HEMOGLOBIN (HGB A1C): Hemoglobin A1C: 5.8 % — AB (ref 4.0–5.6)

## 2017-12-04 MED ORDER — CETIRIZINE HCL 10 MG PO TABS: 10.0000 mg | ORAL_TABLET | Freq: Every day | ORAL | 2 refills | Status: DC | PRN

## 2017-12-04 NOTE — Patient Instructions (Signed)
Prediabetes Prediabetes is the condition of having a blood sugar (blood glucose) level that is higher than it should be, but not high enough for you to be diagnosed with type 2 diabetes. Having prediabetes puts you at risk for developing type 2 diabetes (type 2 diabetes mellitus). Prediabetes may be called impaired glucose tolerance or impaired fasting glucose. Prediabetes usually does not cause symptoms. Your health care provider can diagnose this condition with blood tests. You may be tested for prediabetes if you are overweight and if you have at least one other risk factor for prediabetes. Risk factors for prediabetes include:  Having a family member with type 2 diabetes.  Being overweight or obese.  Being older than age 57.  Being of American-Indian, African-American, Hispanic/Latino, or Asian/Pacific Islander descent.  Having an inactive (sedentary) lifestyle.  Having a history of gestational diabetes or polycystic ovarian syndrome (PCOS).  Having low levels of good cholesterol (HDL-C) or high levels of blood fats (triglycerides).  Having high blood pressure.  What is blood glucose and how is blood glucose measured?  Blood glucose refers to the amount of glucose in your bloodstream. Glucose comes from eating foods that contain sugars and starches (carbohydrates) that the body breaks down into glucose. Your blood glucose level may be measured in mg/dL (milligrams per deciliter) or mmol/L (millimoles per liter).Your blood glucose may be checked with one or more of the following blood tests:  A fasting blood glucose (FBG) test. You will not be allowed to eat (you will fast) for at least 8 hours before a blood sample is taken. ? A normal range for FBG is 70-100 mg/dl (3.9-5.6 mmol/L).  An A1c (hemoglobin A1c) blood test. This test provides information about blood glucose control over the previous 2?68month.  An oral glucose tolerance test (OGTT). This test measures your blood  glucose twice: ? After fasting. This is your baseline level. ? Two hours after you drink a beverage that contains glucose.  You may be diagnosed with prediabetes:  If your FBG is 100?125 mg/dL (5.6-6.9 mmol/L).  If your A1c level is 5.7?6.4%.  If your OGGT result is 140?199 mg/dL (7.8-11 mmol/L).  These blood tests may be repeated to confirm your diagnosis. What happens if blood glucose is too high? The pancreas produces a hormone (insulin) that helps move glucose from the bloodstream into cells. When cells in the body do not respond properly to insulin that the body makes (insulin resistance), excess glucose builds up in the blood instead of going into cells. As a result, high blood glucose (hyperglycemia) can develop, which can cause many complications. This is a symptom of prediabetes. What can happen if blood glucose stays higher than normal for a long time? Having high blood glucose for a long time is dangerous. Too much glucose in your blood can damage your nerves and blood vessels. Long-term damage can lead to complications from diabetes, which may include:  Heart disease.  Stroke.  Blindness.  Kidney disease.  Depression.  Poor circulation in the feet and legs, which could lead to surgical removal (amputation) in severe cases.  How can prediabetes be prevented from turning into type 2 diabetes?  To help prevent type 2 diabetes, take the following actions:  Be physically active. ? Do moderate-intensity physical activity for at least 30 minutes on at least 5 days of the week, or as much as told by your health care provider. This could be brisk walking, biking, or water aerobics. ? Ask your health care provider what  activities are safe for you. A mix of physical activities may be best, such as walking, swimming, cycling, and strength training.  Lose weight as told by your health care provider. ? Losing 5-7% of your body weight can reverse insulin resistance. ? Your health  care provider can determine how much weight loss is best for you and can help you lose weight safely.  Follow a healthy meal plan. This includes eating lean proteins, complex carbohydrates, fresh fruits and vegetables, low-fat dairy products, and healthy fats. ? Follow instructions from your health care provider about eating or drinking restrictions. ? Make an appointment to see a diet and nutrition specialist (registered dietitian) to help you create a healthy eating plan that is right for you.  Do not smoke or use any tobacco products, such as cigarettes, chewing tobacco, and e-cigarettes. If you need help quitting, ask your health care provider.  Take over-the-counter and prescription medicines as told by your health care provider. You may be prescribed medicines that help lower the risk of type 2 diabetes.  This information is not intended to replace advice given to you by your health care provider. Make sure you discuss any questions you have with your health care provider. Document Released: 05/22/2015 Document Revised: 07/06/2015 Document Reviewed: 03/21/2015 Elsevier Interactive Patient Education  2018 [employer] Inc.  

## 2017-12-04 NOTE — Progress Notes (Signed)
Patient [facility]  Provider: Lanae Boast, FNP   Hypertension Follow Up Visit  SUBJECTIVE:  Crystal Bullock is a 52 y.o. female who  has a past medical history of Anxiety (10/06/2015), Asthma, Atypical chest pain (10/06/2015), Fibroid, Hypertension, Palpitations (10/06/2015), RBBB (10/06/2015), Sarcoidosis, Scoliosis, Seasonal allergies, and Sickle cell trait ([place]). .   She presents today for follow up on  HTN. Doing well on medications. No side effects reported.  Current Outpatient Medications  Medication Sig Dispense Refill  . albuterol (PROVENTIL HFA;VENTOLIN HFA) 108 (90 Base) MCG/ACT inhaler Inhale 2 puffs into the lungs every 6 (six) hours as needed for wheezing (wheezing). (Patient taking differently: Inhale 2 puffs into the lungs every 6 (six) hours as needed for wheezing or shortness of breath. ) 1 each 5  . amLODipine (NORVASC) 10 MG tablet Take 1 tablet (10 mg total) by mouth daily. 90 tablet 1  . Ascorbic Acid (VITAMIN C) 1000 MG tablet Take 1,000 mg by mouth daily.    . cetirizine (ZYRTEC) 10 MG tablet Take 10 mg by mouth daily as needed for allergies.     . cyclobenzaprine (FLEXERIL) 10 MG tablet Take 10 mg by mouth daily as needed for muscle spasms.     Marland Kitchen esomeprazole (NEXIUM) 40 MG capsule Take 1 capsule (40 mg total) by mouth daily. 30 capsule 3  . Multiple Vitamins-Minerals (MULTIVITAMINS THER. W/MINERALS) TABS Take 1 tablet by mouth daily.    Marland Kitchen NARCAN 4 MG/0.1ML LIQD nasal spray kit USE 1 SPRAY PRN UTD FOR ACCIDENTAL OVERDOSE  0  . oxyCODONE-acetaminophen (PERCOCET) 10-325 MG tablet Take 1 tablet by mouth every 4 (four) hours as needed for pain.    . meclizine (ANTIVERT) 12.5 MG tablet Take 1 tablet (12.5 mg total) by mouth 3 (three) times daily as needed for dizziness. (Patient not taking: Reported on 12/04/2017) 30 tablet 1  . ondansetron (ZOFRAN ODT) 4 MG disintegrating tablet Take 1 tablet (4 mg total) by mouth every  8 (eight) hours as needed for nausea or vomiting. (Patient not taking: Reported on 12/04/2017) 20 tablet 0   No current facility-administered medications for this visit.     Recent Results (from the past 2160 hour(s))  GC/Chlamydia probe amp     Status: None   Collection Time: 09/14/17 12:00 AM  Result Value Ref Range   Chlamydia Negative     Comment: Normal Reference Range - Negative   Neisseria gonorrhea Negative     Comment: Normal Reference Range - Negative  Lipase, blood     Status: None   Collection Time: 09/14/17 11:59 AM  Result Value Ref Range   Lipase 42 11 - 51 U/L    Comment: Performed at [facility], [place] [address], [place], [place] [address]  Comprehensive metabolic panel     Status: Abnormal   Collection Time: 09/14/17 11:59 AM  Result Value Ref Range   Sodium 137 135 - 145 mmol/L   Potassium 3.8 3.5 - 5.1 mmol/L   Chloride 100 98 - 111 mmol/L   CO2 26 22 - 32 mmol/L   Glucose, Bld 98 70 - 99 mg/dL   BUN 10 6 - 20 mg/dL   Creatinine, Ser 1.02 (H) 0.44 - 1.00 mg/dL   Calcium 9.4 8.9 - 10.3 mg/dL   Total Protein 7.5 6.5 - 8.1 g/dL   Albumin 3.6 3.5 - 5.0 g/dL   AST 24 15 - 41 U/L   ALT 22 0 -  44 U/L   Alkaline Phosphatase 52 38 - 126 U/L   Total Bilirubin 0.7 0.3 - 1.2 mg/dL   GFR calc non Af Amer >60 >60 mL/min   GFR calc Af Amer >60 >60 mL/min    Comment: (NOTE) The eGFR has been calculated using the CKD EPI equation. This calculation has not been validated in all clinical situations. eGFR's persistently <60 mL/min signify possible Chronic Kidney Disease.    Anion gap 11 5 - 15    Comment: Performed at [facility] [address] [place], [place] [address]  CBC     Status: Abnormal   Collection Time: 09/14/17 11:59 AM  Result Value Ref Range   WBC 8.9 4.0 - 10.5 K/uL   RBC 5.40 (H) 3.87 - 5.11 MIL/uL   Hemoglobin 13.7 12.0 - 15.0 g/dL   HCT 40.4 36.0 - 46.0 %   MCV 74.8 (L) 78.0 - 100.0 fL   MCH 25.4 (L) 26.0 - 34.0 pg   MCHC 33.9  30.0 - 36.0 g/dL   RDW 14.9 11.5 - 15.5 %   Platelets 273 150 - 400 K/uL    Comment: Performed at [facility], [place] [address] [place], [place] [address]  I-Stat beta hCG blood, ED     Status: None   Collection Time: 09/14/17  1:08 PM  Result Value Ref Range   I-stat hCG, quantitative <5.0 <5 mIU/mL   Comment 3            Comment:   GEST. AGE      CONC.  (mIU/mL)   <=1 WEEK        5 - 50     2 WEEKS       50 - 500     3 WEEKS       100 - 10,000     4 WEEKS     1,000 - 30,000        FEMALE AND NON-PREGNANT FEMALE:     LESS THAN 5 mIU/mL   I-Stat Troponin, ED (not at [facility])     Status: None   Collection Time: 09/14/17  1:08 PM  Result Value Ref Range   Troponin i, poc 0.00 0.00 - 0.08 ng/mL   Comment 3            Comment: Due to the release kinetics of cTnI, a negative result within the first hours of the onset of symptoms does not rule out myocardial infarction with certainty. If myocardial infarction is still suspected, repeat the test at appropriate intervals.   Urinalysis, Routine w reflex microscopic     Status: None   Collection Time: 09/14/17  1:25 PM  Result Value Ref Range   Color, Urine YELLOW YELLOW   APPearance CLEAR CLEAR   Specific Gravity, Urine 1.013 1.005 - 1.030   pH 6.0 5.0 - 8.0   Glucose, UA NEGATIVE NEGATIVE mg/dL   Hgb urine dipstick NEGATIVE NEGATIVE   Bilirubin Urine NEGATIVE NEGATIVE   Ketones, ur NEGATIVE NEGATIVE mg/dL   Protein, ur NEGATIVE NEGATIVE mg/dL   Nitrite NEGATIVE NEGATIVE   Leukocytes, UA NEGATIVE NEGATIVE    Comment: Performed at [facility] [address] [place], [place] [address]  Wet prep, genital     Status: None   Collection Time: 09/14/17  4:43 PM  Result Value Ref Range   Yeast Wet Prep HPF POC NONE SEEN NONE SEEN   Trich, Wet Prep NONE SEEN NONE SEEN  Clue Cells Wet Prep HPF POC NONE SEEN NONE SEEN   WBC, Wet Prep HPF POC NONE SEEN NONE SEEN   Sperm NONE SEEN     Comment: Performed at [facility], [address], [place], [place] [address]    Hypertension ROS: Review of Systems  Constitutional: Negative.   HENT: Negative.   Eyes: Negative.   Respiratory: Negative.   Cardiovascular: Negative.   Gastrointestinal: Negative.   Genitourinary: Negative.   Musculoskeletal: Negative.   Skin: Negative.   Neurological: Negative.   Psychiatric/Behavioral: Negative.      OBJECTIVE:   BP (!) 130/92 (BP Location: Left Arm, Patient Position: Sitting, Cuff Size: Large)   Pulse 74   Temp 98.1 F (36.7 C) (Oral)   Ht 5' 7.5" (1.715 m)   Wt 246 lb (111.6 kg)   SpO2 96%   BMI 37.96 kg/m   Physical Exam  Constitutional: She is oriented to person, place, and time and well-developed, well-nourished, and in no distress. No distress.  HENT:  Head: Normocephalic and atraumatic.  Eyes: Pupils are equal, round, and reactive to light. Conjunctivae and EOM are normal.  Neck: Normal range of motion. Neck supple.  Cardiovascular: Normal rate, regular rhythm and intact distal pulses. Exam reveals no gallop and no friction rub.  No murmur heard. Pulmonary/Chest: Effort normal and breath sounds normal. No respiratory distress. She has no wheezes.  Abdominal: Soft. Bowel sounds are normal. There is no tenderness.  Musculoskeletal: Normal range of motion. She exhibits no edema or tenderness.  Lymphadenopathy:    She has no cervical adenopathy.  Neurological: She is alert and oriented to person, place, and time. Gait normal.  Skin: Skin is warm and dry.  Psychiatric: Mood, memory, affect and judgment normal.  Nursing note and vitals reviewed.    ASSESSMENT/PLAN:   1.1. Essential hypertension Continue with current medications.  Discussed low-salt diet and increased exercise.  2. Prediabetes - POCT glycosylated hemoglobin (Hb A1C) - POCT urinalysis dipstick  3. Allergic rhinitis, unspecified seasonality, unspecified trigger - cetirizine (ZYRTEC) 10 MG tablet; Take 1 tablet (10 mg total) by  mouth daily as needed for allergies.  Dispense: 30 tablet; Refill: 2    The patient is asked to make an attempt to improve diet and exercise patterns to aid in medical management of this problem.  Return to care as scheduled and prn. Patient verbalized understanding and agreed with plan of care.    Ms. Crystal Sou. Nathaneil Canary, FNP-BC Patient [facility] [address] [place], [place] [address] [phone]

## 2017-12-15 DIAGNOSIS — Z79899 Other long term (current) drug therapy: Secondary | ICD-10-CM | POA: Diagnosis not present

## 2017-12-15 DIAGNOSIS — G894 Chronic pain syndrome: Secondary | ICD-10-CM | POA: Diagnosis not present

## 2017-12-15 DIAGNOSIS — L732 Hidradenitis suppurativa: Secondary | ICD-10-CM | POA: Diagnosis not present

## 2017-12-15 DIAGNOSIS — M5136 Other intervertebral disc degeneration, lumbar region: Secondary | ICD-10-CM | POA: Diagnosis not present

## 2017-12-16 ENCOUNTER — Telehealth: Payer: Self-pay

## 2017-12-16 NOTE — Telephone Encounter (Signed)
Called, and spoke with patient to schedule medicare wellness visit. She states she will have to call back to make appointment. Thanks!

## 2017-12-19 ENCOUNTER — Ambulatory Visit: Payer: Self-pay | Admitting: Family Medicine

## 2017-12-26 ENCOUNTER — Ambulatory Visit: Payer: Self-pay | Admitting: Adult Health

## 2018-01-14 DIAGNOSIS — G894 Chronic pain syndrome: Secondary | ICD-10-CM | POA: Diagnosis not present

## 2018-01-14 DIAGNOSIS — M5136 Other intervertebral disc degeneration, lumbar region: Secondary | ICD-10-CM | POA: Diagnosis not present

## 2018-01-14 DIAGNOSIS — Z79899 Other long term (current) drug therapy: Secondary | ICD-10-CM | POA: Diagnosis not present

## 2018-01-14 DIAGNOSIS — L732 Hidradenitis suppurativa: Secondary | ICD-10-CM | POA: Diagnosis not present

## 2018-03-06 ENCOUNTER — Ambulatory Visit (INDEPENDENT_AMBULATORY_CARE_PROVIDER_SITE_OTHER): Payer: Medicare Other | Admitting: Family Medicine

## 2018-03-06 ENCOUNTER — Encounter: Payer: Self-pay | Admitting: Family Medicine

## 2018-03-06 VITALS — BP 138/92 | HR 82 | Temp 98.2°F | Resp 16 | Ht 67.5 in | Wt 242.0 lb

## 2018-03-06 DIAGNOSIS — M545 Low back pain, unspecified: Secondary | ICD-10-CM

## 2018-03-06 DIAGNOSIS — R232 Flushing: Secondary | ICD-10-CM

## 2018-03-06 DIAGNOSIS — I10 Essential (primary) hypertension: Secondary | ICD-10-CM | POA: Diagnosis not present

## 2018-03-06 DIAGNOSIS — G8929 Other chronic pain: Secondary | ICD-10-CM | POA: Diagnosis not present

## 2018-03-06 DIAGNOSIS — L732 Hidradenitis suppurativa: Secondary | ICD-10-CM

## 2018-03-06 LAB — POCT URINALYSIS DIPSTICK
Bilirubin, UA: NEGATIVE
Blood, UA: NEGATIVE
Glucose, UA: NEGATIVE
Ketones, UA: NEGATIVE
Nitrite, UA: NEGATIVE
Protein, UA: POSITIVE — AB
Spec Grav, UA: 1.015 (ref 1.010–1.025)
Urobilinogen, UA: 0.2 E.U./dL
pH, UA: 7 (ref 5.0–8.0)

## 2018-03-06 MED ORDER — KETOROLAC TROMETHAMINE 60 MG/2ML IM SOLN
60.0000 mg | Freq: Once | INTRAMUSCULAR | Status: AC
  Administered 2018-03-06: 60 mg via INTRAMUSCULAR

## 2018-03-06 MED ORDER — AMOXICILLIN-POT CLAVULANATE 875-125 MG PO TABS: 1.0000 | ORAL_TABLET | Freq: Two times a day (BID) | ORAL | 0 refills | Status: DC

## 2018-03-06 MED ORDER — AMLODIPINE BESYLATE 10 MG PO TABS: 10.0000 mg | ORAL_TABLET | Freq: Every day | ORAL | 1 refills | Status: DC

## 2018-03-06 NOTE — Patient Instructions (Addendum)
Hypertension Hypertension is another name for high blood pressure. High blood pressure forces your heart to work harder to pump blood. This can cause problems over time. There are two numbers in a blood pressure reading. There is a top number (systolic) over a bottom number (diastolic). It is best to have a blood pressure below 120/80. Healthy choices can help lower your blood pressure. You may need medicine to help lower your blood pressure if:  Your blood pressure cannot be lowered with healthy choices.  Your blood pressure is higher than 130/80. Follow these instructions at home: Eating and drinking   If directed, follow the DASH eating plan. This diet includes: ? Filling half of your plate at each meal with fruits and vegetables. ? Filling one quarter of your plate at each meal with whole grains. Whole grains include whole wheat pasta, brown rice, and whole grain bread. ? Eating or drinking low-fat dairy products, such as skim milk or low-fat yogurt. ? Filling one quarter of your plate at each meal with low-fat (lean) proteins. Low-fat proteins include fish, skinless chicken, eggs, beans, and tofu. ? Avoiding fatty meat, cured and processed meat, or chicken with skin. ? Avoiding premade or processed food.  Eat less than 1,500 mg of salt (sodium) a day.  Limit alcohol use to no more than 1 drink a day for nonpregnant women and 2 drinks a day for men. One drink equals 12 oz of beer, 5 oz of wine, or 1 oz of hard liquor. Lifestyle  Work with your doctor to stay at a healthy weight or to lose weight. Ask your doctor what the best weight is for you.  Get at least 30 minutes of exercise that causes your heart to beat faster (aerobic exercise) most days of the week. This may include walking, swimming, or biking.  Get at least 30 minutes of exercise that strengthens your muscles (resistance exercise) at least 3 days a week. This may include lifting weights or pilates.  Do not use any  products that contain nicotine or tobacco. This includes cigarettes and e-cigarettes. If you need help quitting, ask your doctor.  Check your blood pressure at home as told by your doctor.  Keep all follow-up visits as told by your doctor. This is important. Medicines  Take over-the-counter and prescription medicines only as told by your doctor. Follow directions carefully.  Do not skip doses of blood pressure medicine. The medicine does not work as well if you skip doses. Skipping doses also puts you at risk for problems.  Ask your doctor about side effects or reactions to medicines that you should watch for. Contact a doctor if:  You think you are having a reaction to the medicine you are taking.  You have headaches that keep coming back (recurring).  You feel dizzy.  You have swelling in your ankles.  You have trouble with your vision. Get help right away if:  You get a very bad headache.  You start to feel confused.  You feel weak or numb.  You feel faint.  You get very bad pain in your: ? Chest. ? Belly (abdomen).  You throw up (vomit) more than once.  You have trouble breathing. Summary  Hypertension is another name for high blood pressure.  Making healthy choices can help lower blood pressure. If your blood pressure cannot be controlled with healthy choices, you may need to take medicine. This information is not intended to replace advice given to you by your health  care provider. Make sure you discuss any questions you have with your health care provider. Document Released: 07/17/2007 Document Revised: 12/27/2015 Document Reviewed: 12/27/2015 Elsevier Interactive Patient Education  2019 [place] DASH stands for "Dietary Approaches to Stop Hypertension." The DASH eating plan is a healthy eating plan that has been shown to reduce high blood pressure (hypertension). It may also reduce your risk for type 2 diabetes, heart disease, and  stroke. The DASH eating plan may also help with weight loss. What are tips for following this plan?  General guidelines  Avoid eating more than 2,300 mg (milligrams) of salt (sodium) a day. If you have hypertension, you may need to reduce your sodium intake to 1,500 mg a day.  Limit alcohol intake to no more than 1 drink a day for nonpregnant women and 2 drinks a day for men. One drink equals 12 oz of beer, 5 oz of wine, or 1 oz of hard liquor.  Work with your health care provider to maintain a healthy body weight or to lose weight. Ask what an ideal weight is for you.  Get at least 30 minutes of exercise that causes your heart to beat faster (aerobic exercise) most days of the week. Activities may include walking, swimming, or biking.  Work with your health care provider or diet and nutrition specialist (dietitian) to adjust your eating plan to your individual calorie needs. Reading food labels   Check food labels for the amount of sodium per serving. Choose foods with less than 5 percent of the Daily Value of sodium. Generally, foods with less than 300 mg of sodium per serving fit into this eating plan.  To find whole grains, look for the word "whole" as the first word in the ingredient list. Shopping  Buy products labeled as "low-sodium" or "no salt added."  Buy fresh foods. Avoid canned foods and premade or frozen meals. Cooking  Avoid adding salt when cooking. Use salt-free seasonings or herbs instead of table salt or sea salt. Check with your health care provider or pharmacist before using salt substitutes.  Do not fry foods. Cook foods using healthy methods such as baking, boiling, grilling, and broiling instead.  Cook with heart-healthy oils, such as olive, canola, soybean, or sunflower oil. Meal planning  Eat a balanced diet that includes: ? 5 or more servings of fruits and vegetables each day. At each meal, try to fill half of your plate with fruits and vegetables. ? Up  to 6-8 servings of whole grains each day. ? Less than 6 oz of lean meat, poultry, or fish each day. A 3-oz serving of meat is about the same size as a deck of cards. One egg equals 1 oz. ? 2 servings of low-fat dairy each day. ? A serving of nuts, seeds, or beans 5 times each week. ? Heart-healthy fats. Healthy fats called Omega-3 fatty acids are found in foods such as flaxseeds and coldwater fish, like sardines, salmon, and mackerel.  Limit how much you eat of the following: ? Canned or prepackaged foods. ? Food that is high in trans fat, such as fried foods. ? Food that is high in saturated fat, such as fatty meat. ? Sweets, desserts, sugary drinks, and other foods with added sugar. ? Full-fat dairy products.  Do not salt foods before eating.  Try to eat at least 2 vegetarian meals each week.  Eat more home-cooked food and less restaurant, buffet, and fast food.  When eating  at a restaurant, ask that your food be prepared with less salt or no salt, if possible. What foods are recommended? The items listed may not be a complete list. Talk with your dietitian about what dietary choices are best for you. Grains Whole-grain or whole-wheat bread. Whole-grain or whole-wheat pasta. Brown rice. [place]. Bulgur. Whole-grain and low-sodium cereals. Pita bread. Low-fat, low-sodium crackers. Whole-wheat flour tortillas. Vegetables Fresh or frozen vegetables (raw, steamed, roasted, or grilled). Low-sodium or reduced-sodium tomato and vegetable juice. Low-sodium or reduced-sodium tomato sauce and tomato paste. Low-sodium or reduced-sodium canned vegetables. Fruits All fresh, dried, or frozen fruit. Canned fruit in natural juice (without added sugar). Meat and other protein foods Skinless chicken or [place]. Ground chicken or [place]. Pork with fat trimmed off. Fish and seafood. Egg whites. Dried beans, peas, or lentils. Unsalted nuts, nut butters, and seeds. Unsalted canned beans. Lean cuts of  beef with fat trimmed off. Low-sodium, lean deli meat. Dairy Low-fat (1%) or fat-free (skim) milk. Fat-free, low-fat, or reduced-fat cheeses. Nonfat, low-sodium ricotta or cottage cheese. Low-fat or nonfat yogurt. Low-fat, low-sodium cheese. Fats and oils Soft margarine without trans fats. Vegetable oil. Low-fat, reduced-fat, or light mayonnaise and salad dressings (reduced-sodium). Canola, safflower, olive, soybean, and sunflower oils. Avocado. Seasoning and other foods Herbs. Spices. Seasoning mixes without salt. Unsalted popcorn and pretzels. Fat-free sweets. What foods are not recommended? The items listed may not be a complete list. Talk with your dietitian about what dietary choices are best for you. Grains Baked goods made with fat, such as croissants, muffins, or some breads. Dry pasta or rice meal packs. Vegetables Creamed or fried vegetables. Vegetables in a cheese sauce. Regular canned vegetables (not low-sodium or reduced-sodium). Regular canned tomato sauce and paste (not low-sodium or reduced-sodium). Regular tomato and vegetable juice (not low-sodium or reduced-sodium). Angie Fava. Olives. Fruits Canned fruit in a light or heavy syrup. Fried fruit. Fruit in cream or butter sauce. Meat and other protein foods Fatty cuts of meat. Ribs. Fried meat. Berniece Salines. Sausage. Bologna and other processed lunch meats. Salami. Fatback. Hotdogs. Bratwurst. Salted nuts and seeds. Canned beans with added salt. Canned or smoked fish. Whole eggs or egg yolks. Chicken or [place] with skin. Dairy Whole or 2% milk, cream, and half-and-half. Whole or full-fat cream cheese. Whole-fat or sweetened yogurt. Full-fat cheese. Nondairy creamers. Whipped toppings. Processed cheese and cheese spreads. Fats and oils Butter. Stick margarine. Lard. Shortening. Ghee. Bacon fat. Tropical oils, such as coconut, palm kernel, or palm oil. Seasoning and other foods Salted popcorn and pretzels. Onion salt, garlic salt, seasoned  salt, table salt, and sea salt. Worcestershire sauce. Tartar sauce. Barbecue sauce. Teriyaki sauce. Soy sauce, including reduced-sodium. Steak sauce. Canned and packaged gravies. Fish sauce. Oyster sauce. Cocktail sauce. Horseradish that you find on the shelf. Ketchup. Mustard. Meat flavorings and tenderizers. Bouillon cubes. Hot sauce and Tabasco sauce. Premade or packaged marinades. Premade or packaged taco seasonings. Relishes. Regular salad dressings. Where to find more information:  National Heart, Lung, and [place]: https://wilson-eaton.com/  American Heart Association: www.heart.org Summary  The DASH eating plan is a healthy eating plan that has been shown to reduce high blood pressure (hypertension). It may also reduce your risk for type 2 diabetes, heart disease, and stroke.  With the DASH eating plan, you should limit salt (sodium) intake to 2,300 mg a day. If you have hypertension, you may need to reduce your sodium intake to 1,500 mg a day.  When on the DASH eating plan, aim to  eat more fresh fruits and vegetables, whole grains, lean proteins, low-fat dairy, and heart-healthy fats.  Work with your health care provider or diet and nutrition specialist (dietitian) to adjust your eating plan to your individual calorie needs. This information is not intended to replace advice given to you by your health care provider. Make sure you discuss any questions you have with your health care provider. Document Released: 01/17/2011 Document Revised: 01/22/2016 Document Reviewed: 01/22/2016 Elsevier Interactive Patient Education  2019 [place], Cimicifuga racemosa oral dosage forms What is this medicine? BLACK COHOSH (blak KOH hosh) or Cimicifuga racemosa is a dietary supplement. It is promoted to relieve symptoms of menopause, such as hot flashes. The FDA has not approved this supplement for any medical use. This supplement may be used for other purposes; ask your health care  provider or pharmacist if you have questions. This medicine may be used for other purposes; ask your health care provider or pharmacist if you have questions. What should I tell my health care provider before I take this medicine? They need to know if you have any of these conditions: -breast cancer -cervical, ovarian or uterine cancer -high blood pressure -infertility -liver disease -menstrual changes or irregular periods -unusual vaginal or uterine bleeding -an unusual or allergic reaction to black cohosh, soybeans, tartrazine dye (yellow dye number 5), other medicines, foods, dyes, or preservatives -pregnant or trying to get pregnant -breast-feeding How should I use this medicine? Take this herb by mouth with a glass of water. Follow the directions on the package labeling, or talk to your health care professional. Do not use for longer than 6 months without the advice of a health care professional. Do not use if you are pregnant or breast-feeding. Talk to your obstetrician-gynecologist or certified nurse-midwife. This herb is not for use in children under the age of 29 years. Overdosage: If you think you have taken too much of this medicine contact a poison control center or emergency room at once. NOTE: This medicine is only for you. Do not share this medicine with others. What if I miss a dose? If you miss a dose, take it as soon as you can. If it is almost time for your next dose, take only that dose. Do not take double or extra doses. What may interact with this medicine? -atorvastatin -cisplatin -fertility treatments This list may not describe all possible interactions. Give your health care provider a list of all the medicines, herbs, non-prescription drugs, or dietary supplements you use. Also tell them if you smoke, drink alcohol, or use illegal drugs. Some items may interact with your medicine. What should I watch for while using this medicine? Since this herb is derived from a  plant, allergic reactions are possible. Stop using this herb if you develop a rash. You may need to see your health care professional, or inform them that this occurred. Report any unusual side effects promptly. If you are taking this herb for menstrual or menopausal symptoms, visit your doctor or health care professional for regular checks on your progress. You should have a complete check-up every 6 months. You will need a regular breast and pelvic exam while on this therapy. Follow the advice of your doctor or health care professional. Women should inform their doctor if they wish to become pregnant or think they might be pregnant. If you have any reason to think you are pregnant, stop taking this herb at once and contact your doctor or health care professional.  Herbal or dietary supplements are not regulated like medicines. Rigid quality control standards are not required for dietary supplements. The purity and strength of these products can vary. The safety and effect of this dietary supplement for a certain disease or illness is not well known. This product is not intended to diagnose, treat, cure or prevent any disease. The Food and Drug Administration suggests the following to help consumers protect themselves: -Always read product labels and follow directions. -Natural does not mean a product is safe for humans to take. -Look for products that include USP after the ingredient name. This means that the manufacturer followed the standards of the U.S. Pharmacopoeia. -Supplements made or sold by a nationally known food or drug company are more likely to be made under tight controls. You can write to the company for more information about how the product was made. What side effects may I notice from receiving this medicine? Side effects that you should report to your doctor or health care professional as soon as possible: -allergic reactions like skin rash, itching or hives, swelling of the face,  lips, or tongue -breathing problems -dizziness -palpitations -signs and symptoms of liver injury like dark yellow or brown urine; general ill feeling or flu-like symptoms; light-colored stools; loss of appetite; nausea; right upper belly pain; unusually weak or tired; yellowing of the eyes or skin -unusual vaginal bleeding Side effects that usually do not require medical attention (report to your doctor or health care professional if they continue or are bothersome): -breast tenderness -headache -nausea -upset stomach This list may not describe all possible side effects. Call your doctor for medical advice about side effects. You may report side effects to FDA at 1-800-FDA-1088. Where should I keep my medicine? Keep out of the reach of children. Store at room temperature between 15 and 30 degrees C (59 and 86 degrees C). Throw away any unused herb after the expiration date. NOTE: This sheet is a summary. It may not cover all possible information. If you have questions about this medicine, talk to your doctor, pharmacist, or health care provider.  2019 Elsevier/Gold Standard (2015-08-09 14:35:09)

## 2018-03-06 NOTE — Progress Notes (Signed)
[facility]   Progress Note: General Provider: Lanae Boast, FNP  SUBJECTIVE:   Crystal Bullock is a 53 y.o. female who  has a past medical history of Anxiety (10/06/2015), Asthma, Atypical chest pain (10/06/2015), Fibroid, Hypertension, Palpitations (10/06/2015), RBBB (10/06/2015), Sarcoidosis, Scoliosis, Seasonal allergies, and Sickle cell trait ([place]).. Patient presents today for Hypertension and Recurrent Skin Infections (in betwen thighs)  Patient states that she was given a medication in the past from a dermatologist at [facility] that she is supposed to take TID. Unsure of the name.  States that she started taking the medications yesterday. Was given 4 Augmentin to take for this. Patient states that "I take 2 and the boils dissolve". Patient states that she was instructed not to take augmentin in the usual 7 day course.   Patient states that she also has hot flashes and has been taking black seed oil for this without relief.  Seen by [facility] and has not taken her pain medications today.  Review of Systems  Constitutional: Negative.   HENT: Negative.   Eyes: Negative.   Respiratory: Negative.   Cardiovascular: Negative.   Gastrointestinal: Negative.   Genitourinary: Negative.   Musculoskeletal: Positive for back pain.  Skin: Positive for rash.  Neurological: Negative.   Psychiatric/Behavioral: Negative.      OBJECTIVE: Ht 5' 7.5" (1.715 m)   Wt 242 lb (109.8 kg)   BMI 37.34 kg/m   Wt Readings from Last 3 Encounters:  03/06/18 242 lb (109.8 kg)  12/04/17 246 lb (111.6 kg)  09/17/17 249 lb (112.9 kg)     Physical Exam Vitals signs and nursing note reviewed.  Constitutional:      General: She is not in acute distress.    Appearance: She is well-developed.  HENT:     Head: Normocephalic and atraumatic.  Eyes:     Conjunctiva/sclera: Conjunctivae normal.     Pupils: Pupils are equal, round, and  reactive to light.  Neck:     Musculoskeletal: Normal range of motion.  Cardiovascular:     Rate and Rhythm: Normal rate and regular rhythm.     Heart sounds: Normal heart sounds.  Pulmonary:     Effort: Pulmonary effort is normal. No respiratory distress.     Breath sounds: Normal breath sounds.  Abdominal:     General: Bowel sounds are normal. There is no distension.     Palpations: Abdomen is soft.  Musculoskeletal:     Lumbar back: She exhibits decreased range of motion and pain.  Skin:    General: Skin is warm and dry.     Findings: Abscess (multiple lesions noted under the left breast. Various levels of excoriation. ) and rash present. Rash is pustular.  Neurological:     Mental Status: She is alert and oriented to person, place, and time.  Psychiatric:        Mood and Affect: Mood normal.        Behavior: Behavior normal.        Thought Content: Thought content normal.        Judgment: Judgment normal.     ASSESSMENT/PLAN:  1. Essential hypertension Patient advised to take medications daily as prescribed. Will continue to monitor.  - amLODipine (NORVASC) 10 MG tablet; Take 1 tablet (10 mg total) by mouth daily.  Dispense: 90 tablet; Refill: 1 - Urinalysis Dipstick  2. Chronic low back pain, unspecified back pain laterality, unspecified whether sciatica present  Will need to follow up with pain management - ketorolac (TORADOL) injection 60 mg  3. Hot flashes Suggested black cohosh for hot flashes instead of black seed oil.    4. Hidradenitis suppurativa Advised patient to take full antibiotic course as prescribed. Will need to follow up with her dermatologist for this.  - amoxicillin-clavulanate (AUGMENTIN) 875-125 MG tablet; Take 1 tablet by mouth 2 (two) times daily for 7 days.  Dispense: 14 tablet; Refill: 0    No follow-ups on file.    The patient was given clear instructions to go to ER or return to medical center if symptoms do not improve, worsen or new  problems develop. The patient verbalized understanding and agreed with plan of care.   Ms. Doug Sou. Nathaneil Canary, FNP-BC [facility] [address] [place], [place] [id number] [phone]

## 2018-03-09 ENCOUNTER — Other Ambulatory Visit: Payer: Self-pay | Admitting: Family Medicine

## 2018-03-09 DIAGNOSIS — K219 Gastro-esophageal reflux disease without esophagitis: Secondary | ICD-10-CM

## 2018-04-02 ENCOUNTER — Ambulatory Visit: Payer: Medicare Other | Admitting: Internal Medicine

## 2018-05-07 ENCOUNTER — Telehealth: Payer: Self-pay | Admitting: Pulmonary Disease

## 2018-05-07 DIAGNOSIS — D869 Sarcoidosis, unspecified: Secondary | ICD-10-CM

## 2018-05-07 MED ORDER — ALBUTEROL SULFATE HFA 108 (90 BASE) MCG/ACT IN AERS: 2.0000 | INHALATION_SPRAY | Freq: Four times a day (QID) | RESPIRATORY_TRACT | 2 refills | Status: DC | PRN

## 2018-05-07 NOTE — Telephone Encounter (Signed)
Rx sent Left message for patient that before future refill she will need to be seen in office Nothing further needed.

## 2018-05-08 ENCOUNTER — Encounter: Payer: Self-pay | Admitting: Pulmonary Disease

## 2018-05-08 ENCOUNTER — Ambulatory Visit (INDEPENDENT_AMBULATORY_CARE_PROVIDER_SITE_OTHER): Payer: Medicare Other | Admitting: Pulmonary Disease

## 2018-05-08 ENCOUNTER — Other Ambulatory Visit: Payer: Self-pay

## 2018-05-08 DIAGNOSIS — D869 Sarcoidosis, unspecified: Secondary | ICD-10-CM

## 2018-05-08 DIAGNOSIS — J4520 Mild intermittent asthma, uncomplicated: Secondary | ICD-10-CM | POA: Diagnosis not present

## 2018-05-08 DIAGNOSIS — J309 Allergic rhinitis, unspecified: Secondary | ICD-10-CM | POA: Diagnosis not present

## 2018-05-08 DIAGNOSIS — F419 Anxiety disorder, unspecified: Secondary | ICD-10-CM | POA: Diagnosis not present

## 2018-05-08 MED ORDER — HYDROXYZINE PAMOATE 50 MG PO CAPS: ORAL_CAPSULE | ORAL | 1 refills | Status: DC

## 2018-05-08 MED ORDER — ALBUTEROL SULFATE (2.5 MG/3ML) 0.083% IN NEBU: 2.5000 mg | INHALATION_SOLUTION | Freq: Four times a day (QID) | RESPIRATORY_TRACT | 12 refills | Status: DC | PRN

## 2018-05-08 MED ORDER — ALBUTEROL SULFATE HFA 108 (90 BASE) MCG/ACT IN AERS: 2.0000 | INHALATION_SPRAY | Freq: Four times a day (QID) | RESPIRATORY_TRACT | 2 refills | Status: DC | PRN

## 2018-05-08 NOTE — Patient Instructions (Addendum)
Refilled albuterol rescue inhaler Refilled albuterol nebulized medications  Only use your albuterol as a rescue medication to be used if you can't catch your breath by resting or doing a relaxed purse lip breathing pattern.  - The less you use it, the better it will work when you need it. - Ok to use up to 2 puffs  every 6 hours if you must but call for immediate appointment if use goes up over your usual need - Don't leave home without it !!  (think of it like the spare tire for your car)   Okay to use albuterol nebs every 6 hours as needed in place of rescue inhaler if at home  Continue daily Zyrtec Continue daily Flonase Continue nasal saline rinses twice daily  Start hydroxyzine 50 mg tablets >>> Can take 1/2 tablet, or 1 tablet, or 2 tablets every 8 hours as needed for anxiety/initiating sleep >>> This medication is sedating >>> Start with 25 mg (half tablet) initially to see how you tolerate   Follow-up with Wyn Quaker, FNP telephonically in June as scheduled       Coronavirus (COVID-19) Are you at risk?  Are you at risk for the Coronavirus (COVID-19)?  To be considered HIGH RISK for Coronavirus (COVID-19), you have to meet the following criteria:  . Traveled to [place], [place], [place], [place] or [place]; or in the [place] to [place], [place], [place], or [place]; and have fever, cough, and shortness of breath within the last 2 weeks of travel OR . Been in close contact with a person diagnosed with COVID-19 within the last 2 weeks and have fever, cough, and shortness of breath . IF YOU DO NOT MEET THESE CRITERIA, YOU ARE CONSIDERED LOW RISK FOR COVID-19.  What to do if you are HIGH RISK for COVID-19?  Marland Kitchen If you are having a medical emergency, call 911. . Seek medical care right away. Before you go to a doctor's office, urgent care or emergency department, call ahead and tell them about your recent travel, contact with someone diagnosed with COVID-19, and  your symptoms. You should receive instructions from your physician's office regarding next steps of care.  . When you arrive at healthcare provider, tell the healthcare staff immediately you have returned from visiting [place], [place], [place], [place] or [place]; or traveled in the [place] to [place], [place], [place], or [place]; in the last two weeks or you have been in close contact with a person diagnosed with COVID-19 in the last 2 weeks.   . Tell the health care staff about your symptoms: fever, cough and shortness of breath. . After you have been seen by a medical provider, you will be either: o Tested for (COVID-19) and discharged home on quarantine except to seek medical care if symptoms worsen, and asked to  - Stay home and avoid contact with others until you get your results (4-5 days)  - Avoid travel on public transportation if possible (such as bus, train, or airplane) or o Sent to the Emergency Department by EMS for evaluation, COVID-19 testing, and possible admission depending on your condition and test results.  What to do if you are LOW RISK for COVID-19?  Reduce your risk of any infection by using the same precautions used for avoiding the common cold or flu:  Marland Kitchen Wash your hands often with soap and warm water for at least 20 seconds.  If soap and water are not readily available, use  an alcohol-based hand sanitizer with at least 60% alcohol.  . If coughing or sneezing, cover your mouth and nose by coughing or sneezing into the elbow areas of your shirt or coat, into a tissue or into your sleeve (not your hands). . Avoid shaking hands with others and consider head nods or verbal greetings only. . Avoid touching your eyes, nose, or mouth with unwashed hands.  . Avoid close contact with people who are sick. . Avoid places or events with large numbers of people in one location, like concerts or sporting events. . Carefully consider travel plans you have or are  making. . If you are planning any travel outside or inside the [place], visit the CDC's Travelers' Health webpage for the latest health notices. . If you have some symptoms but not all symptoms, continue to monitor at home and seek medical attention if your symptoms worsen. . If you are having a medical emergency, call 911.   [place] / e-Visit: [email or web address]         [facility]: [facility]: [phone]                   [facility]: [phone]           It is flu season:   >>> Best ways to protect herself from the flu: Receive the yearly flu vaccine, practice good hand hygiene washing with soap and also using hand sanitizer when available, eat a nutritious meals, get adequate rest, hydrate appropriately   Please contact the office if your symptoms worsen or you have concerns that you are not improving.   Thank you for choosing [facility] for your healthcare, and for allowing [place] to partner with you on your healthcare journey. I am thankful to be able to provide care to you today.   [name]

## 2018-05-08 NOTE — Assessment & Plan Note (Signed)
Assessment: Needs refills of albuterol rescue inhaler as well as albuterol nebulizer Patient reporting that she also needs new nebulizer equipment  Plan: Refill albuterol rescue inhaler Refill albuterol nebs Okay to use albuterol every 6-8 hours as needed for shortness of breath and wheezing [facility] to get patient new nebulizer supplies Schedule telephonic out reach in June/2020 with Wyn Quaker, FNP

## 2018-05-08 NOTE — Assessment & Plan Note (Signed)
Assessment: Probable allergic rhinitis Difficult to fully assess Telephone call Patient reporting clear nasal drainage  Plan: Continue Zyrtec Continue nasal saline rinses twice daily Hydroxyzine ordered

## 2018-05-08 NOTE — Assessment & Plan Note (Signed)
Assessment: Patient reporting that she feels more anxious recently Not managed on any sort of medications for anxiety  Plan: Hydroxyzine order placed for as needed use Patient needs to follow-up with primary care

## 2018-05-08 NOTE — Addendum Note (Signed)
Addended by: Joella Prince on: 05/08/2018 09:45 AM   Modules accepted: Orders

## 2018-05-08 NOTE — Progress Notes (Signed)
Virtual Visit via Telephone Note  I connected with Crystal Bullock on 05/08/18 at  9:30 AM EDT by telephone and verified that I am speaking with the correct person using two identifiers.   I discussed the limitations, risks, security and privacy concerns of performing an evaluation and management service by telephone and the availability of in person appointments. I also discussed with the patient that there may be a patient responsible charge related to this service. The patient expressed understanding and agreed to proceed.   History of Present Illness:  Patient consented to consult via telephone: yes People present and their role in pt care: pt  Chief complaint: Allergies Rhinitis / Anxiety   53 year old never smoker followed in our office for sarcoidosis as well as asthma.  Patient contacted our office needing refills on her rescue inhaler she reports that she needs to use it daily during the spring and fall months due to allergies.  She is ran out of her rescue inhaler as well as her nebulizing medications.  Patient reports that breathing has been stable prior to that.  She denies having any sort of chest pain. Denies recent steroid use. She does have a diagnosis of sarcoid which is biopsy-proven from bronchoscopy in 1997.   Overall patient's main persistent complaints today are clear nasal drainage, worsened allergy symptoms, and anxiety.  She reports that she is still taking her Zyrtec as well as taking Flonase and doing nasal saline rinses.  Observations/Objective:  10/2015 Myoview low risk, EF 54% 11/2015 NPSG TST 196 minutes, AHI 0  CT angiogram 08/2015 negative PE /mediastinal lymph nodes   Lab Results  Component Value Date   NITRICOXIDE 17 05/13/2017   Pulmonary Functions Testing Results:  No results found for: FEV1, FVC, FEV1FVC, TLC, DLCO  Assessment and Plan:  Asthma Assessment: Needs refills of albuterol rescue inhaler as well as albuterol nebulizer Patient  reporting that she also needs new nebulizer equipment  Plan: Refill albuterol rescue inhaler Refill albuterol nebs Okay to use albuterol every 6-8 hours as needed for shortness of breath and wheezing [facility] for DME [facility] to get patient new nebulizer supplies Schedule telephonic out reach in June/2020 with Wyn Quaker, FNP  Allergic rhinitis Assessment: Probable allergic rhinitis Difficult to fully assess Telephone call Patient reporting clear nasal drainage  Plan: Continue Zyrtec Continue nasal saline rinses twice daily Hydroxyzine ordered  Anxiety Assessment: Patient reporting that she feels more anxious recently Not managed on any sort of medications for anxiety  Plan: Hydroxyzine order placed for as needed use Patient needs to follow-up with primary care    Follow Up Instructions:  Telephonic office visit 07/27/2018 at 9:30 AM with Wyn Quaker FNP   I discussed the assessment and treatment plan with the patient. The patient was provided an opportunity to ask questions and all were answered. The patient agreed with the plan and demonstrated an understanding of the instructions.   The patient was advised to call back or seek an in-person evaluation if the symptoms worsen or if the condition fails to improve as anticipated.  I provided 22 minutes of non-face-to-face time during this encounter.   [name], NP

## 2018-05-11 ENCOUNTER — Telehealth: Payer: Self-pay

## 2018-05-11 NOTE — Telephone Encounter (Signed)
Received faxed clarification from [facility]. For insurance request to 'provide doses per day for insurance.' Three times daily is the order per Wyn Quaker, NP.  Order faxed back to [facility] at [phone] and order placed in scan file.

## 2018-05-26 ENCOUNTER — Telehealth: Payer: Self-pay

## 2018-05-27 MED ORDER — CLINDAMYCIN HCL 300 MG PO CAPS: 300.0000 mg | ORAL_CAPSULE | Freq: Three times a day (TID) | ORAL | 0 refills | Status: AC

## 2018-05-27 MED ORDER — CLINDAMYCIN HCL 300 MG PO CAPS: 300.0000 mg | ORAL_CAPSULE | Freq: Three times a day (TID) | ORAL | 0 refills | Status: DC

## 2018-05-27 NOTE — Telephone Encounter (Signed)
Patient has boil on her thigh for about 1 week. Patient states that 1 boil busted and on is still there. Patient would like to use [facility] on [address].

## 2018-05-27 NOTE — Telephone Encounter (Signed)
Patient would like to have a antibiotic sent in

## 2018-06-05 ENCOUNTER — Other Ambulatory Visit: Payer: Self-pay

## 2018-06-05 ENCOUNTER — Encounter: Payer: Self-pay | Admitting: Family Medicine

## 2018-06-05 ENCOUNTER — Ambulatory Visit (INDEPENDENT_AMBULATORY_CARE_PROVIDER_SITE_OTHER): Payer: Medicare Other | Admitting: Family Medicine

## 2018-06-05 DIAGNOSIS — J4520 Mild intermittent asthma, uncomplicated: Secondary | ICD-10-CM

## 2018-06-05 DIAGNOSIS — I10 Essential (primary) hypertension: Secondary | ICD-10-CM

## 2018-06-05 DIAGNOSIS — L732 Hidradenitis suppurativa: Secondary | ICD-10-CM | POA: Diagnosis not present

## 2018-06-05 MED ORDER — AMOXICILLIN-POT CLAVULANATE 875-125 MG PO TABS
1.0000 | ORAL_TABLET | Freq: Two times a day (BID) | ORAL | 0 refills | Status: AC
Start: 2018-06-05 — End: 2018-06-12

## 2018-06-05 NOTE — Patient Instructions (Signed)
Hidradenitis Suppurativa Hidradenitis suppurativa is a long-term (chronic) skin disease. It is similar to a severe form of acne, but it affects areas of the body where acne would be unusual, especially areas of the body where skin rubs against skin and becomes moist. These include:  Underarms.  Groin.  Genital area.  Buttocks.  Upper thighs.  Breasts. Hidradenitis suppurativa may start out as small lumps or pimples caused by blocked sweat glands or hair follicles. Pimples may develop into deep sores that break open (rupture) and drain pus. Over time, affected areas of skin may thicken and become scarred. This condition is rare and does not spread from person to person (non-contagious). What are the causes? The exact cause of this condition is not known. It may be related to:  Female and female hormones.  An overactive disease-fighting system (immune system). The immune system may over-react to blocked hair follicles or sweat glands and cause swelling and pus-filled sores. What increases the risk? You are more likely to develop this condition if you:  Are female.  Are 11-55 years old.  Have a family history of hidradenitis suppurativa.  Have a personal history of acne.  Are overweight.  Smoke.  Take the medicine lithium. What are the signs or symptoms? The first symptoms are usually painful bumps in the skin, similar to pimples. The condition may get worse over time (progress), or it may only cause mild symptoms. If the disease progresses, symptoms may include:  Skin bumps getting bigger and growing deeper into the skin.  Bumps rupturing and draining pus.  Itchy, infected skin.  Skin getting thicker and scarred.  Tunnels under the skin (fistulas) where pus drains from a bump.  Pain during daily activities, such as pain during walking if your groin area is affected.  Emotional problems, such as stress or depression. This condition may affect your appearance and your  ability or willingness to wear certain clothes or do certain activities. How is this diagnosed? This condition is diagnosed by a health care provider who specializes in skin diseases (dermatologist). You may be diagnosed based on:  Your symptoms and medical history.  A physical exam.  Testing a pus sample for infection.  Blood tests. How is this treated? Your treatment will depend on how severe your symptoms are. The same treatment will not work for everybody with this condition. You may need to try several treatments to find what works best for you. Treatment may include:  Cleaning and bandaging (dressing) your wounds as needed.  Lifestyle changes, such as new skin care routines.  Taking medicines, such as: ? Antibiotics. ? Acne medicines. ? Medicines to reduce the activity of the immune system. ? A diabetes medicine (metformin). ? Birth control pills, for women. ? Steroids to reduce swelling and pain.  Working with a mental health care provider, if you experience emotional distress due to this condition. If you have severe symptoms that do not get better with medicine, you may need surgery. Surgery may involve:  Using a laser to clear the skin and remove hair follicles.  Opening and draining deep sores.  Removing the areas of skin that are diseased and scarred. Follow these instructions at home: Medicines   Take over-the-counter and prescription medicines only as told by your health care provider.  If you were prescribed an antibiotic medicine, take it as told by your health care provider. Do not stop taking the antibiotic even if your condition improves. Skin care  If you have open wounds, cover   Medicines    · Take over-the-counter and prescription medicines only as told by your health care provider.  · If you were prescribed an antibiotic medicine, take it as told by your health care provider. Do not stop taking the antibiotic even if your condition improves.  Skin care  · If you have open wounds, cover them with a clean dressing as told by your health care provider. Keep wounds clean by washing them gently with soap and water when you bathe.  · Do not shave the areas where you get hidradenitis suppurativa.  · Do not wear deodorant.  · Wear loose-fitting clothes.  · Try to avoid  getting overheated or sweaty. If you get sweaty or wet, change into clean, dry clothes as soon as you can.  · To help relieve pain and itchiness, cover sore areas with a warm, clean washcloth (warm compress) for 5-10 minutes as often as needed.  · If told by your health care provider, take a bleach bath twice a week:  ? Fill your bathtub halfway with water.  ? Pour in ½ cup of unscented household bleach.  ? Soak in the tub for 5-10 minutes.  ? Only soak from the neck down. Avoid water on your face and hair.  ? Shower to rinse off the bleach from your skin.  General instructions  · Learn as much as you can about your disease so that you have an active role in your treatment. Work closely with your health care provider to find treatments that work for you.  · If you are overweight, work with your health care provider to lose weight as recommended.  · Do not use any products that contain nicotine or tobacco, such as cigarettes and e-cigarettes. If you need help quitting, ask your health care provider.  · If you struggle with living with this condition, talk with your health care provider or work with a mental health care provider as recommended.  · Keep all follow-up visits as told by your health care provider. This is important.  Where to find more information  · Hidradenitis Suppurativa Foundation, Inc.: https://www.hs-foundation.org/  Contact a health care provider if you have:  · A flare-up of hidradenitis suppurativa.  · A fever or chills.  · Trouble controlling your symptoms at home.  · Trouble doing your daily activities because of your symptoms.  · Trouble dealing with emotional problems related to your condition.  Summary  · Hidradenitis suppurativa is a long-term (chronic) skin disease. It is similar to a severe form of acne, but it affects areas of the body where acne would be unusual.  · The first symptoms are usually painful bumps in the skin, similar to pimples. The condition may get worse over time  (progress), or it may only cause mild symptoms.  · If you have open wounds, cover them with a clean dressing as told by your health care provider. Keep wounds clean by washing them gently with soap and water when you bathe.  · Besides skin care, treatment may include medicines, laser treatment, and surgery.  This information is not intended to replace advice given to you by your health care provider. Make sure you discuss any questions you have with your health care provider.  Document Released: 09/12/2003 Document Revised: 02/05/2017 Document Reviewed: 02/05/2017  Elsevier Interactive Patient Education © 2019 [employer] Inc.

## 2018-06-05 NOTE — Progress Notes (Signed)
  Patient [facility]  Virtual Visit via Telephone Note  I connected with Crystal Bullock on 06/05/18 at  2:00 PM EDT by telephone and verified that I am speaking with the correct person using two identifiers.   I discussed the limitations, risks, security and privacy concerns of performing an evaluation and management service by telephone and the availability of in person appointments. I also discussed with the patient that there may be a patient responsible charge related to this service. The patient expressed understanding and agreed to proceed.   History of Present Illness: Crystal Bullock  has a past medical history of Anxiety (10/06/2015), Asthma, Atypical chest pain (10/06/2015), Fibroid, Hypertension, Palpitations (10/06/2015), RBBB (10/06/2015), Sarcoidosis, Scoliosis, Seasonal allergies, and Sickle cell trait ([place]). Patient reports being in pain due to having multiple boils. She states that the only medication that works for her is Augmentin. She is requesting this medication be sent to the pharmacy. Denies Chest pain, SOB, dizziness or leg swelling. She is followed by pulmonology for her asthma. No other problems or concerns at the present time.    Observations/Objective: Patient with regular voice tone, rate and rhythm. Speaking calmly and is in no apparent distress.    Assessment and Plan: 1. Essential hypertension  2. Mild intermittent asthma without complication Patient is being followed by a specialist for this condition. Patient advised to continue with follow up appointments. PCP will continue to monitor progress.   3. Hidradenitis suppurativa - amoxicillin-clavulanate (AUGMENTIN) 875-125 MG tablet; Take 1 tablet by mouth every 12 (twelve) hours for 7 days.  Dispense: 14 tablet; Refill: 0    Follow Up Instructions:  We discussed hand washing, using hand sanitizer when soap and water are not available, only going out when  absolutely necessary, and social distancing. Explained to patient that she is immunocompromised and will need to take precautions during this time.   I discussed the assessment and treatment plan with the patient. The patient was provided an opportunity to ask questions and all were answered. The patient agreed with the plan and demonstrated an understanding of the instructions.   The patient was advised to call back or seek an in-person evaluation if the symptoms worsen or if the condition fails to improve as anticipated.  I provided 10 minutes of non-face-to-face time during this encounter.  Ms. Andr L. Nathaneil Canary, FNP-BC [facility] [address] [place], [place] [address] [phone]

## 2018-06-06 ENCOUNTER — Other Ambulatory Visit: Payer: Self-pay | Admitting: Family Medicine

## 2018-06-06 DIAGNOSIS — K219 Gastro-esophageal reflux disease without esophagitis: Secondary | ICD-10-CM

## 2018-07-07 ENCOUNTER — Other Ambulatory Visit: Payer: Self-pay

## 2018-07-07 ENCOUNTER — Emergency Department (HOSPITAL_COMMUNITY)
Admission: EM | Admit: 2018-07-07 | Discharge: 2018-07-08 | Disposition: A | Discharge status: Home / Self Care | Payer: Medicare Other | Attending: Emergency Medicine | Admitting: Emergency Medicine

## 2018-07-07 ENCOUNTER — Emergency Department (HOSPITAL_COMMUNITY): Payer: Medicare Other

## 2018-07-07 ENCOUNTER — Encounter (HOSPITAL_COMMUNITY): Payer: Self-pay

## 2018-07-07 DIAGNOSIS — R079 Chest pain, unspecified: Secondary | ICD-10-CM

## 2018-07-07 DIAGNOSIS — J45909 Unspecified asthma, uncomplicated: Secondary | ICD-10-CM | POA: Insufficient documentation

## 2018-07-07 DIAGNOSIS — Z20828 Contact with and (suspected) exposure to other viral communicable diseases: Secondary | ICD-10-CM | POA: Insufficient documentation

## 2018-07-07 DIAGNOSIS — Z79899 Other long term (current) drug therapy: Secondary | ICD-10-CM | POA: Insufficient documentation

## 2018-07-07 DIAGNOSIS — I10 Essential (primary) hypertension: Secondary | ICD-10-CM | POA: Insufficient documentation

## 2018-07-07 DIAGNOSIS — Z87891 Personal history of nicotine dependence: Secondary | ICD-10-CM | POA: Diagnosis not present

## 2018-07-07 DIAGNOSIS — E876 Hypokalemia: Secondary | ICD-10-CM | POA: Diagnosis not present

## 2018-07-07 LAB — CBC
HCT: 34.1 % — ABNORMAL LOW (ref 36.0–46.0)
Hemoglobin: 11.6 g/dL — ABNORMAL LOW (ref 12.0–15.0)
MCH: 24.9 pg — ABNORMAL LOW (ref 26.0–34.0)
MCHC: 34 g/dL (ref 30.0–36.0)
MCV: 73.2 fL — ABNORMAL LOW (ref 80.0–100.0)
Platelets: 303 10*3/uL (ref 150–400)
RBC: 4.66 MIL/uL (ref 3.87–5.11)
RDW: 14.6 % (ref 11.5–15.5)
WBC: 8.8 10*3/uL (ref 4.0–10.5)
nRBC: 0 % (ref 0.0–0.2)

## 2018-07-07 LAB — TROPONIN I
Troponin I: <0.03 ng/mL (ref <0.03)
Troponin I: <0.03 ng/mL (ref <0.03)

## 2018-07-07 LAB — BASIC METABOLIC PANEL
Anion gap: 12 (ref 5–15)
BUN: 10 mg/dL (ref 6–20)
CO2: 23 mmol/L (ref 22–32)
Calcium: 9.3 mg/dL (ref 8.9–10.3)
Chloride: 103 mmol/L (ref 98–111)
Creatinine, Ser: 1.03 mg/dL — ABNORMAL HIGH (ref 0.44–1.00)
GFR calc Af Amer: >60 mL/min (ref >60)
GFR calc non Af Amer: >60 mL/min (ref >60)
Glucose, Bld: 142 mg/dL — ABNORMAL HIGH (ref 70–99)
Potassium: 3 mmol/L — ABNORMAL LOW (ref 3.5–5.1)
Sodium: 138 mmol/L (ref 135–145)

## 2018-07-07 LAB — SARS CORONAVIRUS 2 BY RT PCR (HOSPITAL ORDER, PERFORMED IN ~~LOC~~ HOSPITAL LAB): SARS Coronavirus 2: NEGATIVE

## 2018-07-07 MED ORDER — ONDANSETRON HCL 4 MG/2ML IJ SOLN
4.0000 mg | Freq: Once | INTRAMUSCULAR | Status: AC
  Administered 2018-07-08: 4 mg via INTRAVENOUS
  Filled 2018-07-07: qty 2

## 2018-07-07 MED ORDER — POTASSIUM CHLORIDE CRYS ER 20 MEQ PO TBCR
40.0000 meq | EXTENDED_RELEASE_TABLET | Freq: Once | ORAL | Status: AC
  Administered 2018-07-07: 19:00:00 40 meq via ORAL
  Filled 2018-07-07: qty 2

## 2018-07-07 MED ORDER — MORPHINE SULFATE (PF) 4 MG/ML IV SOLN
4.0000 mg | Freq: Once | INTRAVENOUS | Status: AC
  Administered 2018-07-07: 4 mg via INTRAVENOUS
  Filled 2018-07-07: qty 1

## 2018-07-07 MED ORDER — OXYCODONE-ACETAMINOPHEN 5-325 MG PO TABS
1.0000 | ORAL_TABLET | Freq: Once | ORAL | Status: AC
  Administered 2018-07-07: 1 via ORAL
  Filled 2018-07-07: qty 1

## 2018-07-07 NOTE — ED Provider Notes (Signed)
Assumed care in signout to f/u on repeat troponin Troponin is negative When I checked on patient she requested d/c home She had reproducible CP Her EKG is abnormal but  story not c/w ACS Will d/c home    Ripley Fraise, MD 07/07/18 2358

## 2018-07-07 NOTE — ED Triage Notes (Addendum)
1600 CP started, sub sternal. Sharp radiating to the right arm. 324asprin, 6mg  morphine, 2x nitro SL. Denies N/V headache, lightheadedness.

## 2018-07-07 NOTE — Discharge Instructions (Signed)
You were seen in the emergency department for worsening chest pain.  You had blood work EKG and a chest x-ray that did not show an obvious cause of your symptoms.  It will be important for you to follow-up with your primary care doctor for further evaluation.  Please continue your regular medications.  Return if any worsening symptoms.

## 2018-07-07 NOTE — ED Provider Notes (Signed)
[facility] EMERGENCY DEPARTMENT Provider Note   CSN: [id number] Arrival date & time: 07/07/18  1720    History   Chief Complaint Chief Complaint  Patient presents with  . Chest Pain    HPI Crystal Bullock is a 53 y.o. female.  She is brought in by EMS for acute onset of substernal chest pain radiating to her right arm sharp and stabbing in nature worse with palpation twisting turning cough and deep breath.  She said it occurred after she drank some cold water.  With not associate with any nausea vomiting dizziness lightheadedness or diaphoresis.  She said she is never had this before.  No prior history of ischemic heart disease.  HPI: A 53 year old patient with a history of hypertension presents for evaluation of chest pain. Initial onset of pain was approximately 1-3 hours ago. The patient's chest pain is sharp and is not worse with exertion. The patient's chest pain is not middle- or left-sided, is not well-localized, is not described as heaviness/pressure/tightness and does radiate to the arms/jaw/neck. The patient does not complain of nausea and denies diaphoresis. The patient has no history of stroke, has no history of peripheral artery disease, has not smoked in the past 90 days, denies any history of treated diabetes, has no relevant family history of coronary artery disease (first degree relative at less than age 48), has no history of hypercholesterolemia and does not have an elevated BMI (>=30).   The history is provided by the patient.  Chest Pain  Pain location:  Substernal area Pain quality: stabbing   Pain radiates to:  R arm Pain severity:  Severe Onset quality:  Sudden Duration:  90 minutes Timing:  Constant Progression:  Improving Chronicity:  New Relieved by:  Nothing Worsened by:  Coughing, deep breathing and movement Ineffective treatments:  Aspirin and nitroglycerin Associated symptoms: no abdominal pain, no back pain, no cough, no  diaphoresis, no fever, no headache, no nausea, no shortness of breath, no syncope and no vomiting   Risk factors: hypertension and obesity     Past Medical History:  Diagnosis Date  . Anxiety 10/06/2015  . Asthma   . Atypical chest pain 10/06/2015  . Fibroid   . Hypertension   . Palpitations 10/06/2015  . RBBB 10/06/2015  . Sarcoidosis   . Scoliosis   . Seasonal allergies   . Sickle cell trait [facility])     Patient Active Problem List   Diagnosis Date Noted  . Hidradenitis suppurativa 06/05/2018  . Allergic rhinitis 05/08/2018  . OSA (obstructive sleep apnea) 05/13/2017  . Low back pain 02/24/2017  . Sarcoidosis 10/06/2015  . Essential hypertension 10/06/2015  . Asthma 10/06/2015  . Atypical chest pain 10/06/2015  . Anxiety 10/06/2015  . Palpitations 10/06/2015  . RBBB 10/06/2015    Past Surgical History:  Procedure Laterality Date  . AXILLARY LYMPH NODE DISSECTION    . CHOLECYSTECTOMY       OB History   No obstetric history on file.      Home Medications    Prior to Admission medications   Medication Sig Start Date End Date Taking? Authorizing Provider  albuterol (PROVENTIL HFA;VENTOLIN HFA) 108 (90 Base) MCG/ACT inhaler Inhale 2 puffs into the lungs every 6 (six) hours as needed for wheezing (wheezing). 05/08/18   [name], NP  albuterol (PROVENTIL) (2.5 MG/3ML) 0.083% nebulizer solution Take 3 mLs (2.5 mg total) by nebulization every 6 (six) hours as needed for wheezing or shortness of breath.  05/08/18 05/08/19  [name], NP  amLODipine (NORVASC) 10 MG tablet Take 1 tablet (10 mg total) by mouth daily. 03/06/18   Lanae Boast, FNP  Ascorbic Acid (VITAMIN C) 1000 MG tablet Take 1,000 mg by mouth daily.    [provider]  cetirizine (ZYRTEC) 10 MG tablet Take 1 tablet (10 mg total) by mouth daily as needed for allergies. 12/04/17   Lanae Boast, FNP  esomeprazole (NEXIUM) 40 MG capsule TAKE 1 CAPSULE(40 MG) BY MOUTH DAILY 06/08/18   Lanae Boast,  FNP  gabapentin (NEURONTIN) 300 MG capsule Take 300 mg by mouth 3 (three) times daily.    [provider]  hydrOXYzine (VISTARIL) 50 MG capsule Take as prescribed as AVS. 05/08/18   [name], NP  Multiple Vitamins-Minerals (MULTIVITAMINS THER. W/MINERALS) TABS Take 1 tablet by mouth daily.    [provider]  NARCAN 4 MG/0.1ML LIQD nasal spray kit USE 1 SPRAY PRN UTD FOR ACCIDENTAL OVERDOSE 05/07/17   [provider]  ondansetron (ZOFRAN ODT) 4 MG disintegrating tablet Take 1 tablet (4 mg total) by mouth every 8 (eight) hours as needed for nausea or vomiting. 09/14/17   Joy, Shawn C, PA-C  oxyCODONE-acetaminophen (PERCOCET) 10-325 MG tablet Take 1 tablet by mouth every 4 (four) hours as needed for pain.    [provider]    Family History Family History  Problem Relation Age of Onset  . Hypertension Mother   . Diabetes Mother     Social History Social History   Tobacco Use  . Smoking status: Former Research scientist (life sciences)  . Smokeless tobacco: Never Used  Substance Use Topics  . Alcohol use: Yes    Comment: occasional   . Drug use: Yes    Types: Marijuana     Allergies   Ivp dye [iodinated diagnostic agents]   Review of Systems Review of Systems  Constitutional: Negative for diaphoresis and fever.  HENT: Negative for sore throat.   Eyes: Negative for visual disturbance.  Respiratory: Negative for cough and shortness of breath.   Cardiovascular: Positive for chest pain. Negative for syncope.  Gastrointestinal: Negative for abdominal pain, nausea and vomiting.  Genitourinary: Negative for dysuria.  Musculoskeletal: Negative for back pain.  Skin: Negative for rash.  Neurological: Negative for headaches.     Physical Exam Updated Vital Signs BP (!) 148/96   Pulse 69   Temp 97.7 F (36.5 C) (Oral)   Resp 13   Ht 5' 8"  (1.727 m)   Wt 111.1 kg   SpO2 95%   BMI 37.25 kg/m   Physical Exam Vitals signs and nursing note reviewed.   Constitutional:      General: She is not in acute distress.    Appearance: She is well-developed.  HENT:     Head: Normocephalic and atraumatic.  Eyes:     Conjunctiva/sclera: Conjunctivae normal.  Neck:     Musculoskeletal: Neck supple.  Cardiovascular:     Rate and Rhythm: Normal rate and regular rhythm.     Heart sounds: Normal heart sounds. No murmur.  Pulmonary:     Effort: Pulmonary effort is normal. No respiratory distress.     Breath sounds: Normal breath sounds.  Chest:     Chest wall: Tenderness present. No deformity or crepitus.  Abdominal:     Palpations: Abdomen is soft.     Tenderness: There is no abdominal tenderness.  Musculoskeletal: Normal range of motion.     Right lower leg: She exhibits no tenderness. No  edema.     Left lower leg: She exhibits no tenderness. No edema.  Skin:    General: Skin is warm and dry.     Capillary Refill: Capillary refill takes less than 2 seconds.  Neurological:     General: No focal deficit present.     Mental Status: She is alert and oriented to person, place, and time.      ED Treatments / Results  Labs (all labs ordered are listed, but only abnormal results are displayed) Labs Reviewed  BASIC METABOLIC PANEL - Abnormal; Notable for the following components:      Result Value   Potassium 3.0 (*)    Glucose, Bld 142 (*)    Creatinine, Ser 1.03 (*)    All other components within normal limits  CBC - Abnormal; Notable for the following components:   Hemoglobin 11.6 (*)    HCT 34.1 (*)    MCV 73.2 (*)    MCH 24.9 (*)    All other components within normal limits  SARS CORONAVIRUS 2 (HOSPITAL ORDER, [place] [facility])  TROPONIN I  TROPONIN I    EKG EKG Interpretation  Date/Time:  Tuesday Jul 07 2018 17:25:38 EDT Ventricular Rate:  95 PR Interval:    QRS Duration: 158 QT Interval:  424 QTC Calculation: 534 R Axis:   86 Text Interpretation:  Sinus rhythm Right bundle branch block ST depr,  consider ischemia, inferior leads anterior ST depressions worse than prior 8/19 Confirmed by Aletta Edouard [phone]) on 07/07/2018 5:29:49 PM   Radiology Dg Chest Port 1 View  Result Date: 07/07/2018 CLINICAL DATA:  53 year old female with history of sharp substernal chest pain radiating into the right arm. No shortness of breath. EXAM: PORTABLE CHEST 1 VIEW COMPARISON:  Chest x-ray 01/13/2017. FINDINGS: Lung volumes are low. Bibasilar ill-defined opacities likely to reflect areas of subsegmental atelectasis. No consolidative airspace disease. No pleural effusions. No pneumothorax. No pulmonary nodule or mass noted. Pulmonary vasculature and the cardiomediastinal silhouette are within normal limits. IMPRESSION: 1. Low lung volumes without radiographic evidence of acute cardiopulmonary disease. Electronically Signed   By: Vinnie Langton M.D.   On: 07/07/2018 17:59    Procedures Procedures (including critical care time)  Medications Ordered in ED Medications  morphine 4 MG/ML injection 4 mg (4 mg Intravenous Given 07/07/18 1739)  potassium chloride SA (K-DUR) CR tablet 40 mEq (40 mEq Oral Given 07/07/18 1858)  oxyCODONE-acetaminophen (PERCOCET/ROXICET) 5-325 MG per tablet 1 tablet (1 tablet Oral Given 07/07/18 2051)  ondansetron (ZOFRAN) injection 4 mg (4 mg Intravenous Given 07/08/18 0006)     Initial Impression / Assessment and Plan / ED Course  I have reviewed the triage vital signs and the nursing notes.  Pertinent labs & imaging results that were available during my care of the patient were reviewed by me and considered in my medical decision making (see chart for details).  Clinical Course as of Jul 07 1621  Tue Jul 07, 2018  1735 French diagnosis includes ACS, pneumothorax, musculoskeletal, GERD, pleurisy, PE, pneumonia.  She is clear reproducible tenderness on exam.  Her EKG unfortunately is a bundle and looks more ischemic than prior.  She has a history of sarcoid but no known  coronary disease.   [MB]  2234 Patient's symptoms have improved here.  Her work-up including troponin and Covid have been negative.  She is awaiting a second troponin.  If this is negative I think she can be safely discharged to follow-up outpatient  with her primary care doctor and likely cardiology.   [MB]    Clinical Course User Index [MB] Hayden Rasmussen, MD    HEAR Score: 3  Final Clinical Impressions(s) / ED Diagnoses   Final diagnoses:  Nonspecific chest pain  Hypokalemia    ED Discharge Orders    None       Hayden Rasmussen, MD 07/08/18 1624

## 2018-07-07 NOTE — ED Notes (Signed)
Nurse Navigator communication: Pt wishes to update her son Edwyna Ready on the status of her care.

## 2018-07-20 ENCOUNTER — Telehealth: Payer: Self-pay | Admitting: Adult Health

## 2018-07-20 NOTE — Telephone Encounter (Signed)
Mychart pending, smartphone, verbal consent, pre reg complete 07/20/18 AF

## 2018-07-26 NOTE — Progress Notes (Deleted)
{Choose 1 Note Type (Telehealth Visit or Telephone Visit):[phone]}   Date:  07/26/2018   ID:  Crystal Bullock, DOB April 12, 1965, MRN [id number]  {Patient [phone]::"Home"} {Provider Location:[phone]::"Home"}  PCP:  Lanae Boast, FNP  Cardiologist:  [place]  Electrophysiologist:  None   Evaluation Performed:  {Choose Visit YQMV:[id number]::"WUXLKG-MW Visit"}  Chief Complaint:  ***  History of Present Illness:    Crystal Bullock is a 53 y.o. female with known history of hypertension, sarcoidosis, and asthma who was last seen by Dr. Oval Linsey in 2017 with complaints of chest pain and left shoulder pain.  It was recommended at that time that she have a stress Myoview to evaluate for ischemia and echocardiogram for LV function with recent lower extremity edema and shortness of breath.  Due to history of sarcoidosis she also wanted to rule out cardiac sarcoid.  She was started on metoprolol 25 daily due to elevated blood pressure and symptoms of palpitations.  The stress test was found to be normal as read by Dr. Oval Linsey on 11/02/2015.  The patient was seen in the emergency room on 07/07/2018 with complaints of substernal chest pain radiating to her right arm described as sharp and stabbing in nature and worse with palpation twisting turning coughing and deep breathing.  EKG revealed sinus rhythm with right bundle blanch block.  The patient was ruled out for ACS, and diagnosed with atypical chest pain.  She was to follow-up with cardiology.  She was negative for COVID-19.    The patient {does/does not:200015} have symptoms concerning for COVID-19 infection (fever, chills, cough, or new shortness of breath).    Past Medical History:  Diagnosis Date  . Anxiety 10/06/2015  . Asthma   . Atypical chest pain 10/06/2015  . Fibroid   . Hypertension   . Palpitations 10/06/2015  . RBBB 10/06/2015  . Sarcoidosis   . Scoliosis   . Seasonal allergies   . Sickle cell trait  [facility])    Past Surgical History:  Procedure Laterality Date  . AXILLARY LYMPH NODE DISSECTION    . CHOLECYSTECTOMY       No outpatient medications have been marked as taking for the 07/27/18 encounter (Appointment) with Lendon Colonel, NP.     Allergies:   Ivp dye [iodinated diagnostic agents]   Social History   Tobacco Use  . Smoking status: Former Research scientist (life sciences)  . Smokeless tobacco: Never Used  Substance Use Topics  . Alcohol use: Yes    Comment: occasional   . Drug use: Yes    Types: Marijuana     Family Hx: The patient's family history includes Diabetes in her mother; Hypertension in her mother.  ROS:   Please see the history of present illness.    *** All other systems reviewed and are negative.   Prior CV studies:   The following studies were reviewed today: NM Stress Test 11/02/2018  Blood pressure demonstrated a normal response to exercise.  No T wave inversion was noted during stress.  There was no ST segment deviation noted during stress.  This is a low risk study.  Nuclear stress EF: 54%.   Normal perfusion. LVEF 54% with normal wall motion. This is a low risk study.  Sleep Study 11/13/2015 IMPRESSIONS - No significant obstructive sleep apnea occurred during this study (AHI = 0.0/h). - No significant central sleep apnea occurred during this study (CAI = 0.0/h). - The patient had minimal or no oxygen desaturation during the study (Min O2 = 92.00%) - The patient  snored with Moderate snoring volume. - No cardiac abnormalities were noted during this study. - Clinically significant periodic limb movements did not occur during sleep. No significant associated arousals.  Labs/Other Tests and Data Reviewed:    EKG:  {EKG/Telemetry Strips Reviewed:[id number]}  Recent Labs: 09/14/2017: ALT 22 07/07/2018: BUN 10; Creatinine, Ser 1.03; Hemoglobin 11.6; Platelets 303; Potassium 3.0; Sodium 138   Recent Lipid Panel No results found for: CHOL, TRIG, HDL,  CHOLHDL, LDLCALC, LDLDIRECT  Wt Readings from Last 3 Encounters:  07/07/18 245 lb (111.1 kg)  03/06/18 242 lb (109.8 kg)  12/04/17 246 lb (111.6 kg)     Objective:    Vital Signs:  There were no vitals taken for this visit.   {HeartCare Virtual Exam (Optional):[phone]::"VITAL SIGNS:  reviewed"}  ASSESSMENT & PLAN:    1. ***  COVID-19 Education: The signs and symptoms of COVID-19 were discussed with the patient and how to seek care for testing (follow up with PCP or arrange E-visit).  ***The importance of social distancing was discussed today.  Time:   Today, I have spent *** minutes with the patient with telehealth technology discussing the above problems.     Medication Adjustments/Labs and Tests Ordered: Current medicines are reviewed at length with the patient today.  Concerns regarding medicines are outlined above.   Tests Ordered: No orders of the defined types were placed in this encounter.   Medication Changes: No orders of the defined types were placed in this encounter.   Disposition:  Follow up {follow up:15908}  Signed, Phill Myron. [place] Pugh, ANP, AACC  07/26/2018 3:01 PM    [facility]

## 2018-07-27 ENCOUNTER — Telehealth: Payer: Medicare Other | Admitting: Adult Health

## 2018-07-27 ENCOUNTER — Ambulatory Visit: Payer: Self-pay | Admitting: Pulmonary Disease

## 2018-08-02 NOTE — Progress Notes (Signed)
Virtual Visit via Telephone Note  I connected with Crystal Bullock on 08/03/18 at 11:30 AM EDT by telephone and verified that I am speaking with the correct person using two identifiers.  Location: Patient: Home Provider: [facility] - [address], [place], [place], [place] [address]   I discussed the limitations, risks, security and privacy concerns of performing an evaluation and management service by telephone and the availability of in person appointments. I also discussed with the patient that there may be a patient responsible charge related to this service. The patient expressed understanding and agreed to proceed.  Patient consented to consult via telephone: Yes People present and their role in pt care: Pt   History of Present Illness:  53 year old never smoker followed in our office for sarcoidosis as well as asthma. Pt of Dr. Elsworth Soho  Chief complaint: Chest Pain / Feels she has OSA    53 year old female former smoker followed in our office for sarcoidosis as well as asthma.  Patient has not managed on any maintenance inhalers.  Patient is completing a 10-week follow-up with our office today.  This is a telephonic visit due to COVID-19 pandemic.  Through chart review reveals patient presented to the emergency room on 07/07/2018 for chest pain.  Patient reports at that point time she was having sharp stabbing chest pain over her left breast.  She reports that since then she has had increased chest tightness.  Patient was last seen by cardiology in 2017 Dr. Oval Linsey.  Seem that patient had lots of no-shows to cardiology visits since then.  Patient also reports that her blood pressure has been elevated.  Patient has completed a telephonic visit with her primary care provider since last office visit.  Patient did not talk with her primary care provider about her ongoing anxiety.  Patient continues to use her rescue inhaler more often than usual.  She is using her rescue  inhaler 6 times daily for the last 2 weeks.  She also reports that she is coughing up clear mucus.  Patient is also adamant that she feels that she has sleep apnea.  She had a sleep study done in 2017 which showed AHI of 0.  Patient feels that her sleep is worsened since then.  She also reports that she has gained weight since then.  Stop bang as well as Epworth score are both elevated today see results listed below:  08/03/2018- STOP BANG questionnaire  Snoring? Yes  Tiredness? Yes  Observed apneas? Yes Elevated blood pressure? Yes  BMI greater than 35? Yes Age greater than 45? Yes  Neck circumference greater than 40 cm? Unsure  Female gender? No   Scoring:  One-point is signed for each.   0-2 equals low risk, 3-4 equals intermediate risk, those with 5 or greater high risk for having obstructive sleep apnea  Score: 6  Results of the Epworth flowsheet 08/03/2018  Sitting and reading 3  Watching TV 3  Sitting, inactive in a public place (e.g. a theatre or a meeting) 2  As a passenger in a car for an hour without a break 3  Lying down to rest in the afternoon when circumstances permit 3  Sitting and talking to someone 2  Sitting quietly after a lunch without alcohol 3  In a car, while stopped for a few minutes in traffic 3  Total score 22     Observations/Objective:  08/03/2018 - BP - 150 / 102 08/03/2018 - HR - 95  10/2015 Myoview low risk, EF 54% 11/2015 NPSG TST 196 minutes, AHI 0  CT angiogram 08/2015 negative PE /mediastinal lymph nodes   Lab Results  Component Value Date   NITRICOXIDE 17 05/13/2017    Assessment and Plan:  OSA (obstructive sleep apnea) Assessment: Epworth score today 22 Stop bang score today 6  Plan: Home sleep study ordered  Essential hypertension Assessment: Blood pressure elevated today based off of home reading the patient provided via the telephone  Plan: Follow-up with primary care as well as cardiology  Asthma Plan: 4-week  follow-up with our office Can use rescue inhaler or nebulized meds every 6 to 8 hours as needed Follow-up with primary care  Allergic rhinitis Plan: Continue Zyrtec  Anxiety Plan: Follow-up with primary care regarding chronic management of anxiety  Atypical chest pain Assessment: Nonspecific chest pain per telephone encounter today Patient reporting chest tightness Patient denies wheezing or increased shortness of breath  Wide differential that could be causing chest pain such as anxiety, active cardiopulmonary disease, or overuse of Saba rescue inhaler.  Plan: If symptoms worsen discussed repeatedly with patient that she needs to follow-up with an emergency room visit or contact 911 She needs to schedule an in person office visit with cardiology as well as with primary care for future follow-up 4-week follow-up with our office, we can consider a chest x-ray at that time   Follow Up Instructions:  Return in about 4 weeks (around 08/31/2018), or if symptoms worsen or fail to improve, for Follow up with Dr. Elsworth Soho, Follow up with [name].   I discussed the assessment and treatment plan with the patient. The patient was provided an opportunity to ask questions and all were answered. The patient agreed with the plan and demonstrated an understanding of the instructions.   The patient was advised to call back or seek an in-person evaluation if the symptoms worsen or if the condition fails to improve as anticipated.  I provided 25 minutes of non-face-to-face time during this encounter.   [name], NP

## 2018-08-03 ENCOUNTER — Other Ambulatory Visit: Payer: Self-pay

## 2018-08-03 ENCOUNTER — Encounter: Payer: Self-pay | Admitting: Pulmonary Disease

## 2018-08-03 ENCOUNTER — Ambulatory Visit (INDEPENDENT_AMBULATORY_CARE_PROVIDER_SITE_OTHER): Payer: Medicare Other | Admitting: Pulmonary Disease

## 2018-08-03 DIAGNOSIS — R0789 Other chest pain: Secondary | ICD-10-CM

## 2018-08-03 DIAGNOSIS — I10 Essential (primary) hypertension: Secondary | ICD-10-CM

## 2018-08-03 DIAGNOSIS — F419 Anxiety disorder, unspecified: Secondary | ICD-10-CM

## 2018-08-03 DIAGNOSIS — R0683 Snoring: Secondary | ICD-10-CM

## 2018-08-03 DIAGNOSIS — G4733 Obstructive sleep apnea (adult) (pediatric): Secondary | ICD-10-CM

## 2018-08-03 DIAGNOSIS — J309 Allergic rhinitis, unspecified: Secondary | ICD-10-CM

## 2018-08-03 DIAGNOSIS — J4520 Mild intermittent asthma, uncomplicated: Secondary | ICD-10-CM

## 2018-08-03 NOTE — Patient Instructions (Addendum)
You need to contact your PCP and Cardiologist and schedule follow up appts as soon as possible due to your recent ED visit for nonspecific chest pain and continued chest tightness   We will order a home sleep study today   Only use your albuterol as a rescue medication to be used if you can't catch your breath by resting or doing a relaxed purse lip breathing pattern.  - The less you use it, the better it will work when you need it. - Ok to use up to 2 puffs  every 4 hours if you must but call for immediate appointment if use goes up over your usual need - Don't leave home without it !!  (think of it like the spare tire for your car)   If your chest pain worsens you need to present to the emergency room immediately or call 911   Our new address:  [address] [place], [place] [address] Telephone number: [phone]   Return in about 4 weeks (around 08/31/2018), or if symptoms worsen or fail to improve, for Follow up with Dr. Elsworth Soho, Follow up with [name].  Coronavirus (COVID-19) Are you at risk?  Are you at risk for the Coronavirus (COVID-19)?  To be considered HIGH RISK for Coronavirus (COVID-19), you have to meet the following criteria:  . Traveled to [place], [place], [place], [place] or [place]; or in the [place] to [place], [place], [place], or [place]; and have fever, cough, and shortness of breath within the last 2 weeks of travel OR . Been in close contact with a person diagnosed with COVID-19 within the last 2 weeks and have fever, cough, and shortness of breath . IF YOU DO NOT MEET THESE CRITERIA, YOU ARE CONSIDERED LOW RISK FOR COVID-19.  What to do if you are HIGH RISK for COVID-19?  Marland Kitchen If you are having a medical emergency, call 911. . Seek medical care right away. Before you go to a doctor's office, urgent care or emergency department, call ahead and tell them about your recent travel, contact with someone diagnosed with COVID-19, and your  symptoms. You should receive instructions from your physician's office regarding next steps of care.  . When you arrive at healthcare provider, tell the healthcare staff immediately you have returned from visiting [place], [place], [place], [place] or [place]; or traveled in the [place] to [place], [place], [place], or [place]; in the last two weeks or you have been in close contact with a person diagnosed with COVID-19 in the last 2 weeks.   . Tell the health care staff about your symptoms: fever, cough and shortness of breath. . After you have been seen by a medical provider, you will be either: o Tested for (COVID-19) and discharged home on quarantine except to seek medical care if symptoms worsen, and asked to  - Stay home and avoid contact with others until you get your results (4-5 days)  - Avoid travel on public transportation if possible (such as bus, train, or airplane) or o Sent to the Emergency Department by EMS for evaluation, COVID-19 testing, and possible admission depending on your condition and test results.  What to do if you are LOW RISK for COVID-19?  Reduce your risk of any infection by using the same precautions used for avoiding the common cold or flu:  Marland Kitchen Wash your hands often with soap and warm water for at least 20 seconds.  If soap  and water are not readily available, use an alcohol-based hand sanitizer with at least 60% alcohol.  . If coughing or sneezing, cover your mouth and nose by coughing or sneezing into the elbow areas of your shirt or coat, into a tissue or into your sleeve (not your hands). . Avoid shaking hands with others and consider head nods or verbal greetings only. . Avoid touching your eyes, nose, or mouth with unwashed hands.  . Avoid close contact with people who are sick. . Avoid places or events with large numbers of people in one location, like concerts or sporting events. . Carefully consider travel plans you have or are making. . If you  are planning any travel outside or inside the [place], visit the CDC's Travelers' Health webpage for the latest health notices. . If you have some symptoms but not all symptoms, continue to monitor at home and seek medical attention if your symptoms worsen. . If you are having a medical emergency, call 911.   [place] / e-Visit: [email or web address]         [facility]: [facility]: [phone]                   [facility]: [phone]           It is flu season:   >>> Best ways to protect herself from the flu: Receive the yearly flu vaccine, practice good hand hygiene washing with soap and also using hand sanitizer when available, eat a nutritious meals, get adequate rest, hydrate appropriately   Please contact the office if your symptoms worsen or you have concerns that you are not improving.   Thank you for choosing [facility] for your healthcare, and for allowing [place] to partner with you on your healthcare journey. I am thankful to be able to provide care to you today.   [name]

## 2018-08-03 NOTE — Assessment & Plan Note (Signed)
Plan: Continue Zyrtec

## 2018-08-03 NOTE — Assessment & Plan Note (Signed)
Plan: Follow-up with primary care regarding chronic management of anxiety

## 2018-08-03 NOTE — Assessment & Plan Note (Signed)
Assessment: Blood pressure elevated today based off of home reading the patient provided via the telephone  Plan: Follow-up with primary care as well as cardiology

## 2018-08-03 NOTE — Assessment & Plan Note (Signed)
Assessment: Nonspecific chest pain per telephone encounter today Patient reporting chest tightness Patient denies wheezing or increased shortness of breath  Wide differential that could be causing chest pain such as anxiety, active cardiopulmonary disease, or overuse of S[place] rescue inhaler.  Plan: If symptoms worsen discussed repeatedly with patient that she needs to follow-up with an emergency room visit or contact 911 She needs to schedule an in person office visit with cardiology as well as with primary care for future follow-up 4-week follow-up with our office, we can consider a chest x-ray at that time

## 2018-08-03 NOTE — Assessment & Plan Note (Signed)
Assessment: Epworth score today 22 Stop bang score today 6  Plan: Home sleep study ordered

## 2018-08-03 NOTE — Assessment & Plan Note (Signed)
Plan: 4-week follow-up with our office Can use rescue inhaler or nebulized meds every 6 to 8 hours as needed Follow-up with primary care

## 2018-08-12 ENCOUNTER — Ambulatory Visit: Payer: Medicare Other

## 2018-08-12 ENCOUNTER — Other Ambulatory Visit: Payer: Self-pay

## 2018-08-12 DIAGNOSIS — R0683 Snoring: Secondary | ICD-10-CM

## 2018-08-12 DIAGNOSIS — G4733 Obstructive sleep apnea (adult) (pediatric): Secondary | ICD-10-CM

## 2018-08-18 DIAGNOSIS — G4733 Obstructive sleep apnea (adult) (pediatric): Secondary | ICD-10-CM

## 2018-08-18 NOTE — Progress Notes (Signed)
@Patient  ID: Crystal Bullock, female    DOB: 10-27-1965, 53 y.o.   MRN: [id number]  Chief Complaint  Patient presents with  . Follow-up    4-week follow-up, patient reports her breathing is stable    Referring provider: Lanae Boast, FNP  HPI:  53 year old never tobacco smoker, regular marijuana smoker followed in our office for sarcoidosis as well as asthma  PMH: Hypertension, atypical chest pain Smoker/ Smoking History: Never tobacco smoker.  Patient regularly smokes marijuana. Maintenance: None Pt of: Patient of Dr. Elsworth Soho  08/19/2018  - Visit   53 year old female patient presenting to our office today as a 4-week follow-up visit.  Patient completed a tele-visit with our office 4 weeks ago.  At that point in time patient's Epworth score was elevated at 22.  Patient had a home sleep study ordered.  Patient had previously completed a sleep study in 2017 that was negative for obstructive sleep apnea.  We are still waiting for the home sleep study results that she completed in 2020.  Overall patient reports that her breathing has been stable and she has been doing well since our last tele-visit.  Patient reports she is used her rescue inhaler 2 times over the last month.  Patient is also reporting that she is managed on Nexium but still has occasional breakthrough episodes of reflux especially at night.  Patient does admit this is when she eats and snacks late at night or after she smokes.    Tests:   10/2015 Myoview low risk, EF 54%  11/2015 NPSG TST 196 minutes, AHI 0  CT angiogram 08/2015 negative PE /mediastinal lymph nodes  Results of the Epworth flowsheet 08/03/2018  Sitting and reading 3  Watching TV 3  Sitting, inactive in a public place (e.g. a theatre or a meeting) 2  As a passenger in a car for an hour without a break 3  Lying down to rest in the afternoon when circumstances permit 3  Sitting and talking to someone 2  Sitting quietly after a lunch without alcohol  3  In a car, while stopped for a few minutes in traffic 3  Total score 22    FENO:  Lab Results  Component Value Date   NITRICOXIDE 17 05/13/2017    PFT: PFT Results Latest Ref Rng & Units 06/24/2017  FVC-Pre L 2.23  FVC-Predicted Pre % 67  Pre FEV1/FVC % % 77  FEV1-Pre L 1.71  FEV1-Predicted Pre % 64    Imaging: No results found.    Specialty Problems      Pulmonary Problems   Asthma   OSA (obstructive sleep apnea)   Allergic rhinitis      Allergies  Allergen Reactions  . Ivp Dye [Iodinated Diagnostic Agents]     Hives on hands and feet per pt 08/16/15 pt had scan w/ contrast and had a 4 hr premedication protocol done     There is no immunization history on file for this patient.  Past Medical History:  Diagnosis Date  . Anxiety 10/06/2015  . Asthma   . Atypical chest pain 10/06/2015  . Fibroid   . Hypertension   . Palpitations 10/06/2015  . RBBB 10/06/2015  . Sarcoidosis   . Scoliosis   . Seasonal allergies   . Sickle cell trait ([place])     Tobacco History: Social History   Tobacco Use  Smoking Status Never Smoker  Smokeless Tobacco Never Used   Counseling given: Yes   Continue to not smoke  Outpatient Encounter Medications as of 08/19/2018  Medication Sig  . albuterol (PROVENTIL HFA;VENTOLIN HFA) 108 (90 Base) MCG/ACT inhaler Inhale 2 puffs into the lungs every 6 (six) hours as needed for wheezing (wheezing).  Marland Kitchen albuterol (PROVENTIL) (2.5 MG/3ML) 0.083% nebulizer solution Take 3 mLs (2.5 mg total) by nebulization every 6 (six) hours as needed for wheezing or shortness of breath.  Marland Kitchen amLODipine (NORVASC) 10 MG tablet Take 1 tablet (10 mg total) by mouth daily.  . cetirizine (ZYRTEC) 10 MG tablet Take 1 tablet (10 mg total) by mouth daily as needed for allergies.  . clonazePAM (KLONOPIN) 1 MG tablet Take 1 mg by mouth 3 (three) times daily as needed for anxiety.  Marland Kitchen esomeprazole (NEXIUM) 40 MG capsule TAKE 1 CAPSULE(40 MG) BY MOUTH DAILY (Patient  taking differently: Take 40 mg by mouth daily. )  . hydrOXYzine (VISTARIL) 50 MG capsule Take as prescribed as AVS. (Patient taking differently: Take 50 mg by mouth daily as needed for anxiety. )  . Multiple Vitamins-Minerals (MULTIVITAMINS THER. W/MINERALS) TABS Take 1 tablet by mouth daily.  . ondansetron (ZOFRAN ODT) 4 MG disintegrating tablet Take 1 tablet (4 mg total) by mouth every 8 (eight) hours as needed for nausea or vomiting.  Marland Kitchen oxyCODONE-acetaminophen (PERCOCET) 10-325 MG tablet Take 1 tablet by mouth 3 (three) times daily as needed for pain.   Marland Kitchen gabapentin (NEURONTIN) 300 MG capsule Take 300 mg by mouth 3 (three) times daily.   No facility-administered encounter medications on file as of 08/19/2018.      Review of Systems  Review of Systems  Constitutional: Negative for chills, fatigue, fever and unexpected weight change.  HENT: Negative for congestion, postnasal drip, sinus pressure, sinus pain, trouble swallowing and voice change.   Respiratory: Positive for shortness of breath (going up stairs, and with heat ). Negative for cough, chest tightness and wheezing.   Cardiovascular: Negative for chest pain and palpitations.  Gastrointestinal: Negative for diarrhea, nausea and vomiting.       Occasional moments of breakthrough reflux she reports this happens maybe 1-2 times a month, almost always directly related to eating late at night  Musculoskeletal: Negative for arthralgias.  Skin: Negative for color change.  Allergic/Immunologic: Negative for environmental allergies and food allergies.  Neurological: Negative for dizziness, light-headedness and headaches.  Psychiatric/Behavioral: Negative for dysphoric mood. The patient is not nervous/anxious.   All other systems reviewed and are negative.    Physical Exam  BP 120/66 (BP Location: Left Arm, Cuff Size: Normal)   Pulse 73   Temp 98.8 F (37.1 C) (Oral)   Ht 5\' 8"  (1.727 m)   Wt 244 lb (110.7 kg)   SpO2 95%   BMI 37.10  kg/m   Wt Readings from Last 5 Encounters:  08/19/18 244 lb (110.7 kg)  07/07/18 245 lb (111.1 kg)  03/06/18 242 lb (109.8 kg)  12/04/17 246 lb (111.6 kg)  09/17/17 249 lb (112.9 kg)     Physical Exam  Constitutional: She is oriented to person, place, and time and well-developed, well-nourished, and in no distress. No distress.  Adult female  HENT:  Head: Normocephalic and atraumatic.  Right Ear: Hearing, tympanic membrane, external ear and ear canal normal.  Left Ear: Hearing, tympanic membrane, external ear and ear canal normal.  Nose: Rhinorrhea present.  Mouth/Throat: Uvula is midline and oropharynx is clear and moist. No oropharyngeal exudate.  Mallampati 3  Eyes: Pupils are equal, round, and reactive to light.  Neck: Normal range of motion.  Neck supple.  Cardiovascular: Normal rate, regular rhythm and normal heart sounds.  Pulmonary/Chest: Effort normal. No accessory muscle usage. No respiratory distress. She has no decreased breath sounds. She has no wheezes. She has no rhonchi. She has no rales.  Abdominal: Soft. Bowel sounds are normal. She exhibits no distension. There is no abdominal tenderness.  Musculoskeletal: Normal range of motion.        General: No edema.  Lymphadenopathy:    She has no cervical adenopathy.  Neurological: She is alert and oriented to person, place, and time. Gait normal.  Skin: Skin is warm and dry. She is not diaphoretic. No erythema.  Psychiatric: Mood, memory, affect and judgment normal.  Nursing note and vitals reviewed.     Lab Results:  CBC    Component Value Date/Time   WBC 8.8 07/07/2018 1731   RBC 4.66 07/07/2018 1731   HGB 11.6 (L) 07/07/2018 1731   HGB 12.0 07/18/2017 0920   HCT 34.1 (L) 07/07/2018 1731   HCT 36.4 07/18/2017 0920   PLT 303 07/07/2018 1731   PLT 285 07/18/2017 0920   MCV 73.2 (L) 07/07/2018 1731   MCV 76 (L) 07/18/2017 0920   MCH 24.9 (L) 07/07/2018 1731   MCHC 34.0 07/07/2018 1731   RDW 14.6  07/07/2018 1731   RDW 16.6 (H) 07/18/2017 0920   LYMPHSABS 2.7 04/24/2017 1300   MONOABS 0.4 06/03/2014 1125   EOSABS 0.3 04/24/2017 1300   BASOSABS 0.0 04/24/2017 1300    BMET    Component Value Date/Time   NA 138 07/07/2018 1731   NA 139 07/18/2017 0920   K 3.0 (L) 07/07/2018 1731   CL 103 07/07/2018 1731   CO2 23 07/07/2018 1731   GLUCOSE 142 (H) 07/07/2018 1731   BUN 10 07/07/2018 1731   BUN 18 07/18/2017 0920   CREATININE 1.03 (H) 07/07/2018 1731   CALCIUM 9.3 07/07/2018 1731   GFRNONAA >60 07/07/2018 1731   GFRAA >60 07/07/2018 1731    BNP No results found for: BNP  ProBNP    Component Value Date/Time   PROBNP 32.0 05/12/2009 1140      Assessment & Plan:   Asthma Plan: 49-month follow-up with our office Can use rescue inhaler or nebulized meds every 6-8 hours as needed  OSA (obstructive sleep apnea) Plan: We will contact patient once home sleep study results are formally read by sleep MD If positive for obstructive sleep apnea will schedule patient to follow-up in 2 months Patient is interested in starting CPAP therapy if positive  Sarcoidosis ([place]) Plan: Continue to monitor clinically  GERD (gastroesophageal reflux disease) Plan: Continue Nexium Start doing lifestyle changes based off of AVS instructions Start to follow GERD diet Contact our office or primary care if symptoms persist despite lifestyle changes and GERD diet   Atypical chest pain Discussion: Stable per patient today, suspect this may actually be driven more from acid reflux based off of patient's HPI today  Plan: Continue to follow clinically Start to follow GERD diet Start lifestyle changes for management of GERD If chest pain starts again contact primary care and/or cardiology  Mild tetrahydrocannabinol (THC) abuse Plan: We recommend that you stop smoking marijuana as this can also affect management of your asthma as well as your acid reflux    Return in about 6  months (around 02/19/2019), or if symptoms worsen or fail to improve, for Follow up with [name], Follow up with Dr. Elsworth Soho.   [name], NP 08/19/2018  This appointment was 28 minutes long with over 50% of the time in direct face-to-face patient care, assessment, plan of care, and follow-up.

## 2018-08-19 ENCOUNTER — Ambulatory Visit (INDEPENDENT_AMBULATORY_CARE_PROVIDER_SITE_OTHER): Payer: Medicare Other | Admitting: Pulmonary Disease

## 2018-08-19 ENCOUNTER — Encounter: Payer: Self-pay | Admitting: Pulmonary Disease

## 2018-08-19 ENCOUNTER — Other Ambulatory Visit: Payer: Self-pay

## 2018-08-19 DIAGNOSIS — R0789 Other chest pain: Secondary | ICD-10-CM

## 2018-08-19 DIAGNOSIS — J4520 Mild intermittent asthma, uncomplicated: Secondary | ICD-10-CM | POA: Diagnosis not present

## 2018-08-19 DIAGNOSIS — K219 Gastro-esophageal reflux disease without esophagitis: Secondary | ICD-10-CM | POA: Diagnosis not present

## 2018-08-19 DIAGNOSIS — G4733 Obstructive sleep apnea (adult) (pediatric): Secondary | ICD-10-CM

## 2018-08-19 DIAGNOSIS — D869 Sarcoidosis, unspecified: Secondary | ICD-10-CM

## 2018-08-19 DIAGNOSIS — F1210 Cannabis abuse, uncomplicated: Secondary | ICD-10-CM

## 2018-08-19 NOTE — Assessment & Plan Note (Signed)
Plan: We recommend that you stop smoking marijuana as this can also affect management of your asthma as well as your acid reflux

## 2018-08-19 NOTE — Assessment & Plan Note (Signed)
Plan: Continue to monitor clinically 

## 2018-08-19 NOTE — Assessment & Plan Note (Signed)
Discussion: Stable per patient today, suspect this may actually be driven more from acid reflux based off of patient's HPI today  Plan: Continue to follow clinically Start to follow GERD diet Start lifestyle changes for management of GERD If chest pain starts again contact primary care and/or cardiology

## 2018-08-19 NOTE — Assessment & Plan Note (Addendum)
Plan: Continue Nexium Start doing lifestyle changes based off of AVS instructions Start to follow GERD diet Contact our office or primary care if symptoms persist despite lifestyle changes and GERD diet

## 2018-08-19 NOTE — Assessment & Plan Note (Signed)
Plan: 51-month follow-up with our office Can use rescue inhaler or nebulized meds every 6-8 hours as needed

## 2018-08-19 NOTE — Assessment & Plan Note (Signed)
Plan: We will contact patient once home sleep study results are formally read by sleep MD If positive for obstructive sleep apnea will schedule patient to follow-up in 2 months Patient is interested in starting CPAP therapy if positive

## 2018-08-19 NOTE — Patient Instructions (Addendum)
We will contact you when you receive your home sleep study results >>> May need to move appointment up if home sleep study results are positive for obstructive sleep apnea  Only use your albuterol as a rescue medication to be used if you can't catch your breath by resting or doing a relaxed purse lip breathing pattern.  - The less you use it, the better it will work when you need it. - Ok to use up to 2 puffs  every 4 hours if you must but call for immediate appointment if use goes up over your usual need - Don't leave home without it !!  (think of it like the spare tire for your car)   We recommend that you stop smoking  Please also work to increase your daily physical activity  Nexium 40 mg tablet  >>>Please take 1 tablet daily 15 minutes to 30 minutes before your first meal of the day as well as before your other medications >>>Try to take at the same time each day >>>take this medication daily  GERD management: >>>Avoid laying flat until 2 hours after meals >>>Elevate head of the bed including entire chest >>>Reduce size of meals and amount of fat, acid, spices, caffeine and sweets >>>If you are smoking, Please stop! >>>Decrease alcohol consumption >>>Work on maintaining a healthy weight with normal BMI   Please review the information listed below regarding acid reflux and diet  Return in about 6 months (around 02/19/2019), or if symptoms worsen or fail to improve, for Follow up with [name], Follow up with Dr. Elsworth Soho.   Coronavirus (COVID-19) Are you at risk?  Are you at risk for the Coronavirus (COVID-19)?  To be considered HIGH RISK for Coronavirus (COVID-19), you have to meet the following criteria:  . Traveled to [place], [place], [place], [place] or [place]; or in the [place] to [place], [place], [place], or [place]; and have fever, cough, and shortness of breath within the last 2 weeks of travel OR . Been in close contact with a person diagnosed with  COVID-19 within the last 2 weeks and have fever, cough, and shortness of breath . IF YOU DO NOT MEET THESE CRITERIA, YOU ARE CONSIDERED LOW RISK FOR COVID-19.  What to do if you are HIGH RISK for COVID-19?  Marland Kitchen If you are having a medical emergency, call 911. . Seek medical care right away. Before you go to a doctor's office, urgent care or emergency department, call ahead and tell them about your recent travel, contact with someone diagnosed with COVID-19, and your symptoms. You should receive instructions from your physician's office regarding next steps of care.  . When you arrive at healthcare provider, tell the healthcare staff immediately you have returned from visiting [place], [place], [place], [place] or [place]; or traveled in the [place] to [place], [place], [place], or [place]; in the last two weeks or you have been in close contact with a person diagnosed with COVID-19 in the last 2 weeks.   . Tell the health care staff about your symptoms: fever, cough and shortness of breath. . After you have been seen by a medical provider, you will be either: o Tested for (COVID-19) and discharged home on quarantine except to seek medical care if symptoms worsen, and asked to  - Stay home and avoid contact with others until you get your results (4-5 days)  - Avoid travel on public transportation if possible (such as bus, train,  or airplane) or o Sent to the Emergency Department by EMS for evaluation, COVID-19 testing, and possible admission depending on your condition and test results.  What to do if you are LOW RISK for COVID-19?  Reduce your risk of any infection by using the same precautions used for avoiding the common cold or flu:  Marland Kitchen Wash your hands often with soap and warm water for at least 20 seconds.  If soap and water are not readily available, use an alcohol-based hand sanitizer with at least 60% alcohol.  . If coughing or sneezing, cover your mouth and nose by coughing or  sneezing into the elbow areas of your shirt or coat, into a tissue or into your sleeve (not your hands). . Avoid shaking hands with others and consider head nods or verbal greetings only. . Avoid touching your eyes, nose, or mouth with unwashed hands.  . Avoid close contact with people who are sick. . Avoid places or events with large numbers of people in one location, like concerts or sporting events. . Carefully consider travel plans you have or are making. . If you are planning any travel outside or inside the [place], visit the CDC's Travelers' Health webpage for the latest health notices. . If you have some symptoms but not all symptoms, continue to monitor at home and seek medical attention if your symptoms worsen. . If you are having a medical emergency, call 911.   [place] / e-Visit: [email or web address]         [facility]: [facility]: [phone]                   [facility]: [phone]           It is flu season:   >>> Best ways to protect herself from the flu: Receive the yearly flu vaccine, practice good hand hygiene washing with soap and also using hand sanitizer when available, eat a nutritious meals, get adequate rest, hydrate appropriately   Please contact the office if your symptoms worsen or you have concerns that you are not improving.   Thank you for choosing [facility] for your healthcare, and for allowing [place] to partner with you on your healthcare journey. I am thankful to be able to provide care to you today.   [name]      Gastroesophageal Reflux Disease, Adult Gastroesophageal reflux (GER) happens when acid from the stomach flows up into the tube that connects the mouth and the stomach (esophagus). Normally, food travels down the esophagus and stays in the stomach to be  digested. With GER, food and stomach acid sometimes move back up into the esophagus. You may have a disease called gastroesophageal reflux disease (GERD) if the reflux:  Happens often.  Causes frequent or very bad symptoms.  Causes problems such as damage to the esophagus. When this happens, the esophagus becomes sore and swollen (inflamed). Over time, GERD can make small holes (ulcers) in the lining of the esophagus. What are the causes? This condition is caused by a problem with the muscle between the esophagus and the stomach. When this muscle is weak or not normal, it does not close properly to keep food and acid from coming back up from the stomach. The muscle can be weak because of:  Tobacco use.  Pregnancy.  Having a certain type of hernia (hiatal hernia).  Alcohol use.  Certain  foods and drinks, such as coffee, chocolate, onions, and peppermint. What increases the risk? You are more likely to develop this condition if you:  Are overweight.  Have a disease that affects your connective tissue.  Use NSAID medicines. What are the signs or symptoms? Symptoms of this condition include:  Heartburn.  Difficult or painful swallowing.  The feeling of having a lump in the throat.  A bitter taste in the mouth.  Bad breath.  Having a lot of saliva.  Having an upset or bloated stomach.  Belching.  Chest pain. Different conditions can cause chest pain. Make sure you see your doctor if you have chest pain.  Shortness of breath or noisy breathing (wheezing).  Ongoing (chronic) cough or a cough at night.  Wearing away of the surface of teeth (tooth enamel).  Weight loss. How is this treated? Treatment will depend on how bad your symptoms are. Your doctor may suggest:  Changes to your diet.  Medicine.  Surgery. Follow these instructions at home: Eating and drinking   Follow a diet as told by your doctor. You may need to avoid foods and drinks such  as: ? Coffee and tea (with or without caffeine). ? Drinks that contain alcohol. ? Energy drinks and sports drinks. ? Bubbly (carbonated) drinks or sodas. ? Chocolate and cocoa. ? Peppermint and mint flavorings. ? Garlic and onions. ? Horseradish. ? Spicy and acidic foods. These include peppers, chili powder, curry powder, vinegar, hot sauces, and BBQ sauce. ? Citrus fruit juices and citrus fruits, such as oranges, lemons, and limes. ? Tomato-based foods. These include red sauce, chili, salsa, and pizza with red sauce. ? Fried and fatty foods. These include donuts, french fries, potato chips, and high-fat dressings. ? High-fat meats. These include hot dogs, rib eye steak, sausage, ham, and bacon. ? High-fat dairy items, such as whole milk, butter, and cream cheese.  Eat small meals often. Avoid eating large meals.  Avoid drinking large amounts of liquid with your meals.  Avoid eating meals during the 2-3 hours before bedtime.  Avoid lying down right after you eat.  Do not exercise right after you eat. Lifestyle   Do not use any products that contain nicotine or tobacco. These include cigarettes, e-cigarettes, and chewing tobacco. If you need help quitting, ask your doctor.  Try to lower your stress. If you need help doing this, ask your doctor.  If you are overweight, lose an amount of weight that is healthy for you. Ask your doctor about a safe weight loss goal. General instructions  Pay attention to any changes in your symptoms.  Take over-the-counter and prescription medicines only as told by your doctor. Do not take aspirin, ibuprofen, or other NSAIDs unless your doctor says it is okay.  Wear loose clothes. Do not wear anything tight around your waist.  Raise (elevate) the head of your bed about 6 inches (15 cm).  Avoid bending over if this makes your symptoms worse.  Keep all follow-up visits as told by your doctor. This is important. Contact a doctor if:  You have  new symptoms.  You lose weight and you do not know why.  You have trouble swallowing or it hurts to swallow.  You have wheezing or a cough that keeps happening.  Your symptoms do not get better with treatment.  You have a hoarse voice. Get help right away if:  You have pain in your arms, neck, jaw, teeth, or back.  You feel sweaty, dizzy, or  light-headed.  You have chest pain or shortness of breath.  You throw up (vomit) and your throw-up looks like blood or coffee grounds.  You pass out (faint).  Your poop (stool) is bloody or black.  You cannot swallow, drink, or eat. Summary  If a person has gastroesophageal reflux disease (GERD), food and stomach acid move back up into the esophagus and cause symptoms or problems such as damage to the esophagus.  Treatment will depend on how bad your symptoms are.  Follow a diet as told by your doctor.  Take all medicines only as told by your doctor. This information is not intended to replace advice given to you by your health care provider. Make sure you discuss any questions you have with your health care provider. Document Released: 07/17/2007 Document Revised: 08/06/2017 Document Reviewed: 08/06/2017 Elsevier Patient Education  2020 [place] for Gastroesophageal Reflux Disease, Adult When you have gastroesophageal reflux disease (GERD), the foods you eat and your eating habits are very important. Choosing the right foods can help ease your discomfort. Think about working with a nutrition specialist (dietitian) to help you make good choices. What are tips for following this plan?  Meals  Choose healthy foods that are low in fat, such as fruits, vegetables, whole grains, low-fat dairy products, and lean meat, fish, and poultry.  Eat small meals often instead of 3 large meals a day. Eat your meals slowly, and in a place where you are relaxed. Avoid bending over or lying down until 2-3 hours after  eating.  Avoid eating meals 2-3 hours before bed.  Avoid drinking a lot of liquid with meals.  Cook foods using methods other than frying. Bake, grill, or broil food instead.  Avoid or limit: ? Chocolate. ? Peppermint or spearmint. ? Alcohol. ? Pepper. ? Black and decaffeinated coffee. ? Black and decaffeinated tea. ? Bubbly (carbonated) soft drinks. ? Caffeinated energy drinks and soft drinks.  Limit high-fat foods such as: ? Fatty meat or fried foods. ? Whole milk, cream, butter, or ice cream. ? Nuts and nut butters. ? Pastries, donuts, and sweets made with butter or shortening.  Avoid foods that cause symptoms. These foods may be different for everyone. Common foods that cause symptoms include: ? Tomatoes. ? Oranges, lemons, and limes. ? Peppers. ? Spicy food. ? Onions and garlic. ? Vinegar. Lifestyle  Maintain a healthy weight. Ask your doctor what weight is healthy for you. If you need to lose weight, work with your doctor to do so safely.  Exercise for at least 30 minutes for 5 or more days each week, or as told by your doctor.  Wear loose-fitting clothes.  Do not smoke. If you need help quitting, ask your doctor.  Sleep with the head of your bed higher than your feet. Use a wedge under the mattress or blocks under the bed frame to raise the head of the bed. Summary  When you have gastroesophageal reflux disease (GERD), food and lifestyle choices are very important in easing your symptoms.  Eat small meals often instead of 3 large meals a day. Eat your meals slowly, and in a place where you are relaxed.  Limit high-fat foods such as fatty meat or fried foods.  Avoid bending over or lying down until 2-3 hours after eating.  Avoid peppermint and spearmint, caffeine, alcohol, and chocolate. This information is not intended to replace advice given to you by your health care provider. Make sure you  discuss any questions you have with your health care  provider. Document Released: 07/30/2011 Document Revised: 05/21/2018 Document Reviewed: 03/05/2016 Elsevier Patient Education  2020 [employer].

## 2018-08-24 ENCOUNTER — Encounter: Payer: Self-pay | Admitting: Family Medicine

## 2018-08-24 ENCOUNTER — Other Ambulatory Visit: Payer: Self-pay

## 2018-08-24 ENCOUNTER — Ambulatory Visit (INDEPENDENT_AMBULATORY_CARE_PROVIDER_SITE_OTHER): Payer: Medicare Other | Admitting: Family Medicine

## 2018-08-24 VITALS — BP 142/98 | HR 78 | Temp 99.1°F | Resp 16 | Ht 68.0 in | Wt 244.0 lb

## 2018-08-24 DIAGNOSIS — F419 Anxiety disorder, unspecified: Secondary | ICD-10-CM | POA: Diagnosis not present

## 2018-08-24 DIAGNOSIS — L732 Hidradenitis suppurativa: Secondary | ICD-10-CM

## 2018-08-24 MED ORDER — HYDROXYZINE HCL 10 MG PO TABS: 10.0000 mg | ORAL_TABLET | Freq: Three times a day (TID) | ORAL | 0 refills | Status: DC | PRN

## 2018-08-24 MED ORDER — AMOXICILLIN-POT CLAVULANATE 875-125 MG PO TABS: 1.0000 | ORAL_TABLET | Freq: Two times a day (BID) | ORAL | 0 refills | Status: DC

## 2018-08-24 MED ORDER — BUSPIRONE HCL 7.5 MG PO TABS: 7.5000 mg | ORAL_TABLET | Freq: Three times a day (TID) | ORAL | 2 refills | Status: DC

## 2018-08-24 NOTE — Patient Instructions (Signed)
Hydroxyzine capsules or tablets What is this medicine? HYDROXYZINE (hye [place] i zeen) is an antihistamine. This medicine is used to treat allergy symptoms. It is also used to treat anxiety and tension. This medicine can be used with other medicines to induce sleep before surgery. This medicine may be used for other purposes; ask your health care provider or pharmacist if you have questions. COMMON BRAND NAME(S): ANX, Atarax, Rezine, Vistaril What should I tell my health care provider before I take this medicine? They need to know if you have any of these conditions:  glaucoma  heart disease  history of irregular heartbeat  kidney disease  liver disease  lung or breathing disease, like asthma  stomach or intestine problems  thyroid disease  trouble passing urine  an unusual or allergic reaction to hydroxyzine, cetirizine, other medicines, foods, dyes or preservatives  pregnant or trying to get pregnant  breast-feeding How should I use this medicine? Take this medicine by mouth with a full glass of water. Follow the directions on the prescription label. You may take this medicine with food or on an empty stomach. Take your medicine at regular intervals. Do not take your medicine more often than directed. Talk to your pediatrician regarding the use of this medicine in children. Special care may be needed. While this drug may be prescribed for children as young as 51 years of age for selected conditions, precautions do apply. Patients over 51 years old may have a stronger reaction and need a smaller dose. Overdosage: If you think you have taken too much of this medicine contact a poison control center or emergency room at once. NOTE: This medicine is only for you. Do not share this medicine with others. What if I miss a dose? If you miss a dose, take it as soon as you can. If it is almost time for your next dose, take only that dose. Do not take double or extra doses. What may  interact with this medicine? Do not take this medicine with any of the following medications:  cisapride  dronedarone  pimozide  thioridazine This medicine may also interact with the following medications:  alcohol  antihistamines for allergy, cough, and cold  atropine  barbiturate medicines for sleep or seizures, like phenobarbital  certain antibiotics like erythromycin or clarithromycin  certain medicines for anxiety or sleep  certain medicines for bladder problems like oxybutynin, tolterodine  certain medicines for depression or psychotic disturbances  certain medicines for irregular heart beat  certain medicines for Parkinson's disease like benztropine, trihexyphenidyl  certain medicines for seizures like phenobarbital, primidone  certain medicines for stomach problems like dicyclomine, hyoscyamine  certain medicines for travel sickness like scopolamine  ipratropium  narcotic medicines for pain  other medicines that prolong the QT interval (an abnormal heart rhythm) like dofetilide This list may not describe all possible interactions. Give your health care provider a list of all the medicines, herbs, non-prescription drugs, or dietary supplements you use. Also tell them if you smoke, drink alcohol, or use illegal drugs. Some items may interact with your medicine. What should I watch for while using this medicine? Tell your doctor or health care professional if your symptoms do not improve. You may get drowsy or dizzy. Do not drive, use machinery, or do anything that needs mental alertness until you know how this medicine affects you. Do not stand or sit up quickly, especially if you are an older patient. This reduces the risk of dizzy or fainting spells. Alcohol may  interfere with the effect of this medicine. Avoid alcoholic drinks. Your mouth may get dry. Chewing sugarless gum or sucking hard candy, and drinking plenty of water may help. Contact your doctor if the  problem does not go away or is severe. This medicine may cause dry eyes and blurred vision. If you wear contact lenses you may feel some discomfort. Lubricating drops may help. See your eye doctor if the problem does not go away or is severe. If you are receiving skin tests for allergies, tell your doctor you are using this medicine. What side effects may I notice from receiving this medicine? Side effects that you should report to your doctor or health care professional as soon as possible:  allergic reactions like skin rash, itching or hives, swelling of the face, lips, or tongue  changes in vision  confusion  fast, irregular heartbeat  seizures  tremor  trouble passing urine or change in the amount of urine Side effects that usually do not require medical attention (report to your doctor or health care professional if they continue or are bothersome):  constipation  drowsiness  dry mouth  headache  tiredness This list may not describe all possible side effects. Call your doctor for medical advice about side effects. You may report side effects to FDA at 1-800-FDA-1088. Where should I keep my medicine? Keep out of the reach of children. Store at room temperature between 15 and 30 degrees C (59 and 86 degrees F). Keep container tightly closed. Throw away any unused medicine after the expiration date. NOTE: This sheet is a summary. It may not cover all possible information. If you have questions about this medicine, talk to your doctor, pharmacist, or health care provider.  2020 Elsevier/Gold Standard (2018-01-19 13:19:55) Buspirone tablets What is this medicine? BUSPIRONE (byoo SPYE rone) is used to treat anxiety disorders. This medicine may be used for other purposes; ask your health care provider or pharmacist if you have questions. COMMON BRAND NAME(S): BuSpar What should I tell my health care provider before I take this medicine? They need to know if you have any of  these conditions:  kidney or liver disease  an unusual or allergic reaction to buspirone, other medicines, foods, dyes, or preservatives  pregnant or trying to get pregnant  breast-feeding How should I use this medicine? Take this medicine by mouth with a glass of water. Follow the directions on the prescription label. You may take this medicine with or without food. To ensure that this medicine always works the same way for you, you should take it either always with or always without food. Take your doses at regular intervals. Do not take your medicine more often than directed. Do not stop taking except on the advice of your doctor or health care professional. Talk to your pediatrician regarding the use of this medicine in children. Special care may be needed. Overdosage: If you think you have taken too much of this medicine contact a poison control center or emergency room at once. NOTE: This medicine is only for you. Do not share this medicine with others. What if I miss a dose? If you miss a dose, take it as soon as you can. If it is almost time for your next dose, take only that dose. Do not take double or extra doses. What may interact with this medicine? Do not take this medicine with any of the following medications:  linezolid  MAOIs like Carbex, Eldepryl, Marplan, Nardil, and Parnate  methylene blue  procarbazine This medicine may also interact with the following medications:  diazepam  digoxin  diltiazem  erythromycin  grapefruit juice  haloperidol  medicines for mental depression or mood problems  medicines for seizures like carbamazepine, phenobarbital and phenytoin  nefazodone  other medications for anxiety  rifampin  ritonavir  some antifungal medicines like itraconazole, ketoconazole, and voriconazole  verapamil  warfarin This list may not describe all possible interactions. Give your health care provider a list of all the medicines, herbs,  non-prescription drugs, or dietary supplements you use. Also tell them if you smoke, drink alcohol, or use illegal drugs. Some items may interact with your medicine. What should I watch for while using this medicine? Visit your doctor or health care professional for regular checks on your progress. It may take 1 to 2 weeks before your anxiety gets better. You may get drowsy or dizzy. Do not drive, use machinery, or do anything that needs mental alertness until you know how this drug affects you. Do not stand or sit up quickly, especially if you are an older patient. This reduces the risk of dizzy or fainting spells. Alcohol can make you more drowsy and dizzy. Avoid alcoholic drinks. What side effects may I notice from receiving this medicine? Side effects that you should report to your doctor or health care professional as soon as possible:  blurred vision or other vision changes  chest pain  confusion  difficulty breathing  feelings of hostility or anger  muscle aches and pains  numbness or tingling in hands or feet  ringing in the ears  skin rash and itching  vomiting  weakness Side effects that usually do not require medical attention (report to your doctor or health care professional if they continue or are bothersome):  disturbed dreams, nightmares  headache  nausea  restlessness or nervousness  sore throat and nasal congestion  stomach upset This list may not describe all possible side effects. Call your doctor for medical advice about side effects. You may report side effects to FDA at 1-800-FDA-1088. Where should I keep my medicine? Keep out of the reach of children. Store at room temperature below 30 degrees C (86 degrees F). Protect from light. Keep container tightly closed. Throw away any unused medicine after the expiration date. NOTE: This sheet is a summary. It may not cover all possible information. If you have questions about this medicine, talk to your  doctor, pharmacist, or health care provider.  2020 Elsevier/Gold Standard (2009-09-07 18:06:11) Living With Anxiety  After being diagnosed with an anxiety disorder, you may be relieved to know why you have felt or behaved a certain way. It is natural to also feel overwhelmed about the treatment ahead and what it will mean for your life. With care and support, you can manage this condition and recover from it. How to cope with anxiety Dealing with stress Stress is your body's reaction to life changes and events, both good and bad. Stress can last just a few hours or it can be ongoing. Stress can play a major role in anxiety, so it is important to learn both how to cope with stress and how to think about it differently. Talk with your health care provider or a counselor to learn more about stress reduction. He or she may suggest some stress reduction techniques, such as:  Music therapy. This can include creating or listening to music that you enjoy and that inspires you.  Mindfulness-based meditation. This involves being aware of your normal  breaths, rather than trying to control your breathing. It can be done while sitting or walking.  Centering prayer. This is a kind of meditation that involves focusing on a word, phrase, or sacred image that is meaningful to you and that brings you peace.  Deep breathing. To do this, expand your stomach and inhale slowly through your nose. Hold your breath for 3-5 seconds. Then exhale slowly, allowing your stomach muscles to relax.  Self-talk. This is a skill where you identify thought patterns that lead to anxiety reactions and correct those thoughts.  Muscle relaxation. This involves tensing muscles then relaxing them. Choose a stress reduction technique that fits your lifestyle and personality. Stress reduction techniques take time and practice. Set aside 5-15 minutes a day to do them. Therapists can offer training in these techniques. The training may be  covered by some insurance plans. Other things you can do to manage stress include:  Keeping a stress diary. This can help you learn what triggers your stress and ways to control your response.  Thinking about how you respond to certain situations. You may not be able to control everything, but you can control your reaction.  Making time for activities that help you relax, and not feeling guilty about spending your time in this way. Therapy combined with coping and stress-reduction skills provides the best chance for successful treatment. Medicines Medicines can help ease symptoms. Medicines for anxiety include:  Anti-anxiety drugs.  Antidepressants.  Beta-blockers. Medicines may be used as the main treatment for anxiety disorder, along with therapy, or if other treatments are not working. Medicines should be prescribed by a health care provider. Relationships Relationships can play a big part in helping you recover. Try to spend more time connecting with trusted friends and family members. Consider going to couples counseling, taking family education classes, or going to family therapy. Therapy can help you and others better understand the condition. How to recognize changes in your condition Everyone has a different response to treatment for anxiety. Recovery from anxiety happens when symptoms decrease and stop interfering with your daily activities at home or work. This may mean that you will start to:  Have better concentration and focus.  Sleep better.  Be less irritable.  Have more energy.  Have improved memory. It is important to recognize when your condition is getting worse. Contact your health care provider if your symptoms interfere with home or work and you do not feel like your condition is improving. Where to find help and support: You can get help and support from these sources:  Self-help groups.  Online and [employer].  A trusted spiritual leader.   Couples counseling.  Family education classes.  Family therapy. Follow these instructions at home:  Eat a healthy diet that includes plenty of vegetables, fruits, whole grains, low-fat dairy products, and lean protein. Do not eat a lot of foods that are high in solid fats, added sugars, or salt.  Exercise. Most adults should do the following: ? Exercise for at least 150 minutes each week. The exercise should increase your heart rate and make you sweat (moderate-intensity exercise). ? Strengthening exercises at least twice a week.  Cut down on caffeine, tobacco, alcohol, and other potentially harmful substances.  Get the right amount and quality of sleep. Most adults need 7-9 hours of sleep each night.  Make choices that simplify your life.  Take over-the-counter and prescription medicines only as told by your health care provider.  Avoid caffeine, alcohol, and  certain over-the-counter cold medicines. These may make you feel worse. Ask your pharmacist which medicines to avoid.  Keep all follow-up visits as told by your health care provider. This is important. Questions to ask your health care provider  Would I benefit from therapy?  How often should I follow up with a health care provider?  How long do I need to take medicine?  Are there any long-term side effects of my medicine?  Are there any alternatives to taking medicine? Contact a health care provider if:  You have a hard time staying focused or finishing daily tasks.  You spend many hours a day feeling worried about everyday life.  You become exhausted by worry.  You start to have headaches, feel tense, or have nausea.  You urinate more than normal.  You have diarrhea. Get help right away if:  You have a racing heart and shortness of breath.  You have thoughts of hurting yourself or others. If you ever feel like you may hurt yourself or others, or have thoughts about taking your own life, get help right  away. You can go to your nearest emergency department or call:  Your local emergency services (911 in the [place]  A suicide crisis helpline, such as the [facility] at 432-830-5114. This is open 24-hours a day. Summary  Taking steps to deal with stress can help calm you.  Medicines cannot cure anxiety disorders, but they can help ease symptoms.  Family, friends, and partners can play a big part in helping you recover from an anxiety disorder. This information is not intended to replace advice given to you by your health care provider. Make sure you discuss any questions you have with your health care provider. Document Released: 01/23/2016 Document Revised: 01/10/2017 Document Reviewed: 01/23/2016 Elsevier Patient Education  2020 [employer].

## 2018-08-24 NOTE — Progress Notes (Signed)
[facility]   Progress Note: General Provider: Lanae Boast, FNP  SUBJECTIVE:   Crystal Bullock is a 53 y.o. female who  has a past medical history of Anxiety (10/06/2015), Asthma, Atypical chest pain (10/06/2015), Fibroid, Hypertension, Palpitations (10/06/2015), RBBB (10/06/2015), Sarcoidosis, Scoliosis, Seasonal allergies, and Sickle cell trait ([place]).. Patient presents today for Follow-up (ER follow up for anxiety ) and Recurrent Skin Infections (several under arm/leg/back )  Patient seen in the ED on 07/07/2018 for chest pain. Found to have anxiety. Patient states that she has been on ativan and klonopin in the past that was prescribed by Dr. Noah Delaine. She states that ativan caused her to have side effect.  She states that she has some klonopin at home but states that she felt like a zombie with taking this. She is on percocet by pain management.  Patient states that she has not seen cardiology since 2017. No Showed the last appt on 07/27/2018 with Purcell Nails, NP. Patient states that she was referred to behavioral health and did not show for the appt.  Also has a history of HS and is requesting augmentin for an outbreak.   Review of Systems  Skin: Positive for rash.  Psychiatric/Behavioral: Negative for depression. The patient is nervous/anxious.   All other systems reviewed and are negative.    OBJECTIVE: BP (!) 159/98 (BP Location: Right Arm, Patient Position: Sitting, Cuff Size: Normal)   Pulse 78   Temp 99.1 F (37.3 C) (Oral)   Resp 16   Ht 5\' 8"  (1.727 m)   Wt 244 lb (110.7 kg)   SpO2 99%   BMI 37.10 kg/m   Wt Readings from Last 3 Encounters:  08/24/18 244 lb (110.7 kg)  08/19/18 244 lb (110.7 kg)  07/07/18 245 lb (111.1 kg)     Physical Exam Vitals signs and nursing note reviewed.  Constitutional:      General: She is not in acute distress.    Appearance: Normal appearance.  HENT:     Head: Normocephalic and  atraumatic.  Eyes:     Extraocular Movements: Extraocular movements intact.     Conjunctiva/sclera: Conjunctivae normal.     Pupils: Pupils are equal, round, and reactive to light.  Cardiovascular:     Rate and Rhythm: Normal rate and regular rhythm.     Heart sounds: No murmur.  Pulmonary:     Effort: Pulmonary effort is normal.     Breath sounds: Normal breath sounds.  Musculoskeletal: Normal range of motion.  Skin:    General: Skin is warm and dry.     Findings: Abscess (right axilla) present.  Neurological:     Mental Status: She is alert and oriented to person, place, and time.  Psychiatric:        Attention and Perception: Attention and perception normal.        Mood and Affect: Mood normal.        Speech: Speech normal.        Behavior: Behavior normal. Behavior is cooperative.        Thought Content: Thought content normal.        Cognition and Memory: Cognition and memory normal.        Judgment: Judgment normal.     ASSESSMENT/PLAN:  1. Anxiety - hydrOXYzine (ATARAX/VISTARIL) 10 MG tablet; Take 1 tablet (10 mg total) by mouth 3 (three) times daily as needed.  Dispense: 30 tablet; Refill: 0 - busPIRone (BUSPAR) 7.5 MG tablet; Take  1 tablet (7.5 mg total) by mouth 3 (three) times daily.  Dispense: 60 tablet; Refill: 2 - Ambulatory referral to Behavioral Health  2. Hidradenitis suppurativa - amoxicillin-clavulanate (AUGMENTIN) 875-125 MG tablet; Take 1 tablet by mouth 2 (two) times daily.  Dispense: 20 tablet; Refill: 0   Return in about 4 weeks (around 09/21/2018) for Anxiety- new medication. .  Time Spent: 25 minutes face-to-face with this patient discussing problems, treatments, and answering patient's questions.    The patient was given clear instructions to go to ER or return to medical center if symptoms do not improve, worsen or new problems develop. The patient verbalized understanding and agreed with plan of care.   Ms. Doug Sou. Nathaneil Canary, FNP-BC [facility] [address] [place], [place] [address] [phone]

## 2018-08-30 ENCOUNTER — Other Ambulatory Visit: Payer: Self-pay | Admitting: Family Medicine

## 2018-08-30 DIAGNOSIS — I10 Essential (primary) hypertension: Secondary | ICD-10-CM

## 2018-09-21 ENCOUNTER — Ambulatory Visit: Payer: Self-pay | Admitting: Family Medicine

## 2018-09-24 ENCOUNTER — Ambulatory Visit (INDEPENDENT_AMBULATORY_CARE_PROVIDER_SITE_OTHER): Payer: Medicare Other | Admitting: Family Medicine

## 2018-09-24 ENCOUNTER — Other Ambulatory Visit: Payer: Self-pay

## 2018-09-24 ENCOUNTER — Encounter: Payer: Self-pay | Admitting: Family Medicine

## 2018-09-24 VITALS — BP 146/89 | HR 79 | Temp 98.9°F | Resp 16 | Ht 68.0 in | Wt 240.0 lb

## 2018-09-24 DIAGNOSIS — B379 Candidiasis, unspecified: Secondary | ICD-10-CM

## 2018-09-24 DIAGNOSIS — F419 Anxiety disorder, unspecified: Secondary | ICD-10-CM | POA: Diagnosis not present

## 2018-09-24 MED ORDER — NYSTATIN 100000 UNIT/GM EX OINT: 1.0000 "application " | TOPICAL_OINTMENT | Freq: Two times a day (BID) | CUTANEOUS | 0 refills | Status: DC

## 2018-09-24 NOTE — Progress Notes (Signed)
[facility]   Progress Note: General Provider: Lanae Boast, FNP  SUBJECTIVE:   Crystal Bullock is a 53 y.o. female who  has a past medical history of Anxiety (10/06/2015), Asthma, Atypical chest pain (10/06/2015), Fibroid, Hypertension, Palpitations (10/06/2015), RBBB (10/06/2015), Sarcoidosis, Scoliosis, Seasonal allergies, and Sickle cell trait ([place]).. Patient presents today for Anxiety (4 week follow up ) At the last visit, patient started on buspar 75mg  TID and Vistatril 50mg  QD PRN. She states that she is only taking her BP medication daily. All other medications she reports taking as needed. She states that she is feeling mildly better. No side effects reported.  She also states that she has noticed a foul smell and itching in her umbilicus. She states that this started approximately 2 years ago and recurs intermittently. She states that there is mild tenderness to the area x 1 week.    Review of Systems  Constitutional: Negative.   HENT: Negative.   Eyes: Negative.   Respiratory: Negative.   Cardiovascular: Negative.   Gastrointestinal: Negative.   Genitourinary: Negative.   Musculoskeletal: Negative.   Skin: Positive for itching and rash (umbilicus).  Neurological: Negative.   Psychiatric/Behavioral: Negative.      OBJECTIVE: BP (!) 146/89 (BP Location: Left Arm, Patient Position: Sitting, Cuff Size: Large)   Pulse 79   Temp 98.9 F (37.2 C) (Oral)   Resp 16   Ht 5\' 8"  (1.727 m)   Wt 240 lb (108.9 kg)   SpO2 100%   BMI 36.49 kg/m   Wt Readings from Last 3 Encounters:  09/24/18 240 lb (108.9 kg)  08/24/18 244 lb (110.7 kg)  08/19/18 244 lb (110.7 kg)     Physical Exam Vitals signs and nursing note reviewed.  Constitutional:      General: She is not in acute distress.    Appearance: Normal appearance.  HENT:     Head: Normocephalic and atraumatic.  Eyes:     Extraocular Movements: Extraocular movements  intact.     Conjunctiva/sclera: Conjunctivae normal.     Pupils: Pupils are equal, round, and reactive to light.  Cardiovascular:     Rate and Rhythm: Normal rate and regular rhythm.     Heart sounds: No murmur.  Pulmonary:     Effort: Pulmonary effort is normal.     Breath sounds: Normal breath sounds.  Musculoskeletal: Normal range of motion.  Skin:    General: Skin is warm and dry.       Neurological:     Mental Status: She is alert and oriented to person, place, and time.  Psychiatric:        Mood and Affect: Mood normal.        Behavior: Behavior normal.        Thought Content: Thought content normal.        Judgment: Judgment normal.     ASSESSMENT/PLAN:  1. Candidiasis Instructed patient to keep umbilicus clean and dry.  - nystatin ointment (MYCOSTATIN); Apply 1 application topically 2 (two) times daily.  Dispense: 30 g; Refill: 0  2. Anxiety Instructed patient to take buspar as prescribed every day for best results. If vistaril is sedating, can cut back to 25mg  as needed.     Return in about 3 months (around 12/25/2018) for Anxiety.    The patient was given clear instructions to go to ER or return to medical center if symptoms do not improve, worsen or new problems develop. The patient  verbalized understanding and agreed with plan of care.   Ms. Doug Sou. Nathaneil Canary, FNP-BC [facility] [address] [place], [place] [address] [phone]

## 2018-09-24 NOTE — Patient Instructions (Signed)
Recommend seeking further treatment at [facility]. They have counselors and psychiatrist.  Address: [address], [place], [place] [address]  Phone: [phone]  Website: [email or web address]  You can also call the number on the back of your insurance card to see which therapists are in your network. Continue with your current medications. The buspirone is to be taken every day and not as needed. You will feel better by taking it regularly.     Living With Anxiety  After being diagnosed with an anxiety disorder, you may be relieved to know why you have felt or behaved a certain way. It is natural to also feel overwhelmed about the treatment ahead and what it will mean for your life. With care and support, you can manage this condition and recover from it. How to cope with anxiety Dealing with stress Stress is your body's reaction to life changes and events, both good and bad. Stress can last just a few hours or it can be ongoing. Stress can play a major role in anxiety, so it is important to learn both how to cope with stress and how to think about it differently. Talk with your health care provider or a counselor to learn more about stress reduction. He or she may suggest some stress reduction techniques, such as:  Music therapy. This can include creating or listening to music that you enjoy and that inspires you.  Mindfulness-based meditation. This involves being aware of your normal breaths, rather than trying to control your breathing. It can be done while sitting or walking.  Centering prayer. This is a kind of meditation that involves focusing on a word, phrase, or sacred image that is meaningful to you and that brings you peace.  Deep breathing. To do this, expand your stomach and inhale slowly through your nose. Hold your breath for 3-5 seconds. Then exhale slowly, allowing your stomach muscles to relax.  Self-talk. This is a skill where you identify thought patterns  that lead to anxiety reactions and correct those thoughts.  Muscle relaxation. This involves tensing muscles then relaxing them. Choose a stress reduction technique that fits your lifestyle and personality. Stress reduction techniques take time and practice. Set aside 5-15 minutes a day to do them. Therapists can offer training in these techniques. The training may be covered by some insurance plans. Other things you can do to manage stress include:  Keeping a stress diary. This can help you learn what triggers your stress and ways to control your response.  Thinking about how you respond to certain situations. You may not be able to control everything, but you can control your reaction.  Making time for activities that help you relax, and not feeling guilty about spending your time in this way. Therapy combined with coping and stress-reduction skills provides the best chance for successful treatment. Medicines Medicines can help ease symptoms. Medicines for anxiety include:  Anti-anxiety drugs.  Antidepressants.  Beta-blockers. Medicines may be used as the main treatment for anxiety disorder, along with therapy, or if other treatments are not working. Medicines should be prescribed by a health care provider. Relationships Relationships can play a big part in helping you recover. Try to spend more time connecting with trusted friends and family members. Consider going to couples counseling, taking family education classes, or going to family therapy. Therapy can help you and others better understand the condition. How to recognize changes in your condition Everyone has a different response to treatment for anxiety. Recovery  from anxiety happens when symptoms decrease and stop interfering with your daily activities at home or work. This may mean that you will start to:  Have better concentration and focus.  Sleep better.  Be less irritable.  Have more energy.  Have improved memory.  It is important to recognize when your condition is getting worse. Contact your health care provider if your symptoms interfere with home or work and you do not feel like your condition is improving. Where to find help and support: You can get help and support from these sources:  Self-help groups.  Online and [employer].  A trusted spiritual leader.  Couples counseling.  Family education classes.  Family therapy. Follow these instructions at home:  Eat a healthy diet that includes plenty of vegetables, fruits, whole grains, low-fat dairy products, and lean protein. Do not eat a lot of foods that are high in solid fats, added sugars, or salt.  Exercise. Most adults should do the following: ? Exercise for at least 150 minutes each week. The exercise should increase your heart rate and make you sweat (moderate-intensity exercise). ? Strengthening exercises at least twice a week.  Cut down on caffeine, tobacco, alcohol, and other potentially harmful substances.  Get the right amount and quality of sleep. Most adults need 7-9 hours of sleep each night.  Make choices that simplify your life.  Take over-the-counter and prescription medicines only as told by your health care provider.  Avoid caffeine, alcohol, and certain over-the-counter cold medicines. These may make you feel worse. Ask your pharmacist which medicines to avoid.  Keep all follow-up visits as told by your health care provider. This is important. Questions to ask your health care provider  Would I benefit from therapy?  How often should I follow up with a health care provider?  How long do I need to take medicine?  Are there any long-term side effects of my medicine?  Are there any alternatives to taking medicine? Contact a health care provider if:  You have a hard time staying focused or finishing daily tasks.  You spend many hours a day feeling worried about everyday life.  You become  exhausted by worry.  You start to have headaches, feel tense, or have nausea.  You urinate more than normal.  You have diarrhea. Get help right away if:  You have a racing heart and shortness of breath.  You have thoughts of hurting yourself or others. If you ever feel like you may hurt yourself or others, or have thoughts about taking your own life, get help right away. You can go to your nearest emergency department or call:  Your local emergency services (911 in the [place]  A suicide crisis helpline, such as the [place] at 774-180-7926. This is open 24-hours a day. Summary  Taking steps to deal with stress can help calm you.  Medicines cannot cure anxiety disorders, but they can help ease symptoms.  Family, friends, and partners can play a big part in helping you recover from an anxiety disorder. This information is not intended to replace advice given to you by your health care provider. Make sure you discuss any questions you have with your health care provider. Document Released: 01/23/2016 Document Revised: 01/10/2017 Document Reviewed: 01/23/2016 Elsevier Patient Education  2020 [employer].

## 2018-10-14 ENCOUNTER — Other Ambulatory Visit: Payer: Self-pay | Admitting: Family Medicine

## 2018-10-14 DIAGNOSIS — L732 Hidradenitis suppurativa: Secondary | ICD-10-CM

## 2018-10-21 ENCOUNTER — Encounter (HOSPITAL_COMMUNITY): Payer: Self-pay

## 2018-10-21 ENCOUNTER — Encounter (HOSPITAL_COMMUNITY): Payer: Self-pay | Admitting: *Deleted

## 2018-11-10 ENCOUNTER — Encounter: Payer: Self-pay | Admitting: Family Medicine

## 2018-11-10 ENCOUNTER — Ambulatory Visit (INDEPENDENT_AMBULATORY_CARE_PROVIDER_SITE_OTHER): Payer: Medicare Other | Admitting: Family Medicine

## 2018-11-10 ENCOUNTER — Other Ambulatory Visit: Payer: Self-pay

## 2018-11-10 VITALS — BP 151/87 | HR 75 | Temp 99.3°F | Ht 68.0 in

## 2018-11-10 DIAGNOSIS — L732 Hidradenitis suppurativa: Secondary | ICD-10-CM

## 2018-11-10 DIAGNOSIS — B379 Candidiasis, unspecified: Secondary | ICD-10-CM

## 2018-11-10 DIAGNOSIS — Z09 Encounter for follow-up examination after completed treatment for conditions other than malignant neoplasm: Secondary | ICD-10-CM | POA: Diagnosis not present

## 2018-11-10 MED ORDER — AMOXICILLIN-POT CLAVULANATE 875-125 MG PO TABS: 1.0000 | ORAL_TABLET | Freq: Two times a day (BID) | ORAL | 0 refills | Status: DC

## 2018-11-10 MED ORDER — FLUCONAZOLE 150 MG PO TABS: 150.0000 mg | ORAL_TABLET | Freq: Once | ORAL | 0 refills | Status: AC

## 2018-11-10 NOTE — Patient Instructions (Signed)
Vaginal Yeast infection, Adult  Vaginal yeast infection is a condition that causes vaginal discharge as well as soreness, swelling, and redness (inflammation) of the vagina. This is a common condition. Some women get this infection frequently. What are the causes? This condition is caused by a change in the normal balance of the yeast (candida) and bacteria that live in the vagina. This change causes an overgrowth of yeast, which causes the inflammation. What increases the risk? The condition is more likely to develop in women who:  Take antibiotic medicines.  Have diabetes.  Take birth control pills.  Are pregnant.  Douche often.  Have a weak body defense system (immune system).  Have been taking steroid medicines for a long time.  Frequently wear tight clothing. What are the signs or symptoms? Symptoms of this condition include:  White, thick, creamy vaginal discharge.  Swelling, itching, redness, and irritation of the vagina. The lips of the vagina (vulva) may be affected as well.  Pain or a burning feeling while urinating.  Pain during sex. How is this diagnosed? This condition is diagnosed based on:  Your medical history.  A physical exam.  A pelvic exam. Your health care provider will examine a sample of your vaginal discharge under a microscope. Your health care provider may send this sample for testing to confirm the diagnosis. How is this treated? This condition is treated with medicine. Medicines may be over-the-counter or prescription. You may be told to use one or more of the following:  Medicine that is taken by mouth (orally).  Medicine that is applied as a cream (topically).  Medicine that is inserted directly into the vagina (suppository). Follow these instructions at home:  Lifestyle  Do not have sex until your health care provider approves. Tell your sex partner that you have a yeast infection. That person should go to his or her health care  provider and ask if they should also be treated.  Do not wear tight clothes, such as pantyhose or tight pants.  Wear breathable cotton underwear. General instructions  Take or apply over-the-counter and prescription medicines only as told by your health care provider.  Eat more yogurt. This may help to keep your yeast infection from returning.  Do not use tampons until your health care provider approves.  Try taking a sitz bath to help with discomfort. This is a warm water bath that is taken while you are sitting down. The water should only come up to your hips and should cover your buttocks. Do this 3-4 times per day or as told by your health care provider.  Do not douche.  If you have diabetes, keep your blood sugar levels under control.  Keep all follow-up visits as told by your health care provider. This is important. Contact a health care provider if:  You have a fever.  Your symptoms go away and then return.  Your symptoms do not get better with treatment.  Your symptoms get worse.  You have new symptoms.  You develop blisters in or around your vagina.  You have blood coming from your vagina and it is not your menstrual period.  You develop pain in your abdomen. Summary  Vaginal yeast infection is a condition that causes discharge as well as soreness, swelling, and redness (inflammation) of the vagina.  This condition is treated with medicine. Medicines may be over-the-counter or prescription.  Take or apply over-the-counter and prescription medicines only as told by your health care provider.  Do not douche.   Do not have sex or use tampons until your health care provider approves.  Contact a health care provider if your symptoms do not get better with treatment or your symptoms go away and then return. This information is not intended to replace advice given to you by your health care provider. Make sure you discuss any questions you have with your health care  provider. Document Released: 11/07/2004 Document Revised: 06/16/2017 Document Reviewed: 06/16/2017 Elsevier Patient Education  [place]. Amoxicillin; Clavulanic Acid tablets What is this medicine? AMOXICILLIN; CLAVULANIC ACID (a mox i SIL in; KLAV yoo lan ic AS id) is a penicillin antibiotic. It is used to treat certain kinds of bacterial infections. It will not work for colds, flu, or other viral infections. This medicine may be used for other purposes; ask your health care provider or pharmacist if you have questions. COMMON BRAND NAME(S): Augmentin What should I tell my health care provider before I take this medicine? They need to know if you have any of these conditions:  bowel disease, like colitis  kidney disease  liver disease  mononucleosis  an unusual or allergic reaction to amoxicillin, penicillin, cephalosporin, other antibiotics, clavulanic acid, other medicines, foods, dyes, or preservatives  pregnant or trying to get pregnant  breast-feeding How should I use this medicine? Take this medicine by mouth with a full glass of water. Follow the directions on the prescription label. Take at the start of a meal. Do not crush or chew. If the tablet has a score line, you may cut it in half at the score line for easier swallowing. Take your medicine at regular intervals. Do not take your medicine more often than directed. Take all of your medicine as directed even if you think you are better. Do not skip doses or stop your medicine early. Talk to your pediatrician regarding the use of this medicine in children. Special care may be needed. Overdosage: If you think you have taken too much of this medicine contact a poison control center or emergency room at once. NOTE: This medicine is only for you. Do not share this medicine with others. What if I miss a dose? If you miss a dose, take it as soon as you can. If it is almost time for your next dose, take only that dose. Do  not take double or extra doses. What may interact with this medicine?  allopurinol  anticoagulants  birth control pills  methotrexate  probenecid This list may not describe all possible interactions. Give your health care provider a list of all the medicines, herbs, non-prescription drugs, or dietary supplements you use. Also tell them if you smoke, drink alcohol, or use illegal drugs. Some items may interact with your medicine. What should I watch for while using this medicine? Tell your doctor or healthcare provider if your symptoms do not improve. This medicine may cause serious skin reactions. They can happen weeks to months after starting the medicine. Contact your healthcare provider right away if you notice fevers or flu-like symptoms with a rash. The rash may be red or purple and then turn into blisters or peeling of the skin. Or, you might notice a red rash with swelling of the face, lips or lymph nodes in your neck or under your arms. Do not treat diarrhea with over the counter products. Contact your doctor if you have diarrhea that lasts more than 2 days or if it is severe and watery. If you have diabetes, you may get a false-positive  result for sugar in your urine. Check with your doctor or healthcare provider. Birth control pills may not work properly while you are taking this medicine. Talk to your doctor about using an extra method of birth control. What side effects may I notice from receiving this medicine? Side effects that you should report to your doctor or health care professional as soon as possible:  allergic reactions like skin rash, itching or hives, swelling of the face, lips, or tongue  breathing problems  dark urine  fever or chills, sore throat  redness, blistering, peeling, or loosening of the skin, including inside the mouth  seizures  trouble passing urine or change in the amount of urine  unusual bleeding, bruising  unusually weak or tired   white patches or sores in the mouth or throat Side effects that usually do not require medical attention (report to your doctor or health care professional if they continue or are bothersome):  diarrhea  dizziness  headache  nausea, vomiting  stomach upset  vaginal or anal irritation This list may not describe all possible side effects. Call your doctor for medical advice about side effects. You may report side effects to FDA at 1-800-FDA-1088. Where should I keep my medicine? Keep out of the reach of children. Store at room temperature below 25 degrees C (77 degrees F). Keep container tightly closed. Throw away any unused medicine after the expiration date. NOTE: This sheet is a summary. It may not cover all possible information. If you have questions about this medicine, talk to your doctor, pharmacist, or health care provider.  2020 Elsevier/Gold Standard (2018-04-13 09:43:46) Hidradenitis Suppurativa Hidradenitis suppurativa is a long-term (chronic) skin disease. It is similar to a severe form of acne, but it affects areas of the body where acne would be unusual, especially areas of the body where skin rubs against skin and becomes moist. These include:  Underarms.  Groin.  Genital area.  Buttocks.  Upper thighs.  Breasts. Hidradenitis suppurativa may start out as small lumps or pimples caused by blocked sweat glands or hair follicles. Pimples may develop into deep sores that break open (rupture) and drain pus. Over time, affected areas of skin may thicken and become scarred. This condition is rare and does not spread from person to person (non-contagious). What are the causes? The exact cause of this condition is not known. It may be related to:  Female and female hormones.  An overactive disease-fighting system (immune system). The immune system may over-react to blocked hair follicles or sweat glands and cause swelling and pus-filled sores. What increases the risk? You  are more likely to develop this condition if you:  Are female.  Are 11-37 years old.  Have a family history of hidradenitis suppurativa.  Have a personal history of acne.  Are overweight.  Smoke.  Take the medicine lithium. What are the signs or symptoms? The first symptoms are usually painful bumps in the skin, similar to pimples. The condition may get worse over time (progress), or it may only cause mild symptoms. If the disease progresses, symptoms may include:  Skin bumps getting bigger and growing deeper into the skin.  Bumps rupturing and draining pus.  Itchy, infected skin.  Skin getting thicker and scarred.  Tunnels under the skin (fistulas) where pus drains from a bump.  Pain during daily activities, such as pain during walking if your groin area is affected.  Emotional problems, such as stress or depression. This condition may affect your appearance and  your ability or willingness to wear certain clothes or do certain activities. How is this diagnosed? This condition is diagnosed by a health care provider who specializes in skin diseases (dermatologist). You may be diagnosed based on:  Your symptoms and medical history.  A physical exam.  Testing a pus sample for infection.  Blood tests. How is this treated? Your treatment will depend on how severe your symptoms are. The same treatment will not work for everybody with this condition. You may need to try several treatments to find what works best for you. Treatment may include:  Cleaning and bandaging (dressing) your wounds as needed.  Lifestyle changes, such as new skin care routines.  Taking medicines, such as: ? Antibiotics. ? Acne medicines. ? Medicines to reduce the activity of the immune system. ? A diabetes medicine (metformin). ? Birth control pills, for women. ? Steroids to reduce swelling and pain.  Working with a mental health care provider, if you experience emotional distress due to this  condition. If you have severe symptoms that do not get better with medicine, you may need surgery. Surgery may involve:  Using a laser to clear the skin and remove hair follicles.  Opening and draining deep sores.  Removing the areas of skin that are diseased and scarred. Follow these instructions at home: Medicines   Take over-the-counter and prescription medicines only as told by your health care provider.  If you were prescribed an antibiotic medicine, take it as told by your health care provider. Do not stop taking the antibiotic even if your condition improves. Skin care  If you have open wounds, cover them with a clean dressing as told by your health care provider. Keep wounds clean by washing them gently with soap and water when you bathe.  Do not shave the areas where you get hidradenitis suppurativa.  Do not wear deodorant.  Wear loose-fitting clothes.  Try to avoid getting overheated or sweaty. If you get sweaty or wet, change into clean, dry clothes as soon as you can.  To help relieve pain and itchiness, cover sore areas with a warm, clean washcloth (warm compress) for 5-10 minutes as often as needed.  If told by your health care provider, take a bleach bath twice a week: ? Fill your bathtub halfway with water. ? Pour in  cup of unscented household bleach. ? Soak in the tub for 5-10 minutes. ? Only soak from the neck down. Avoid water on your face and hair. ? Shower to rinse off the bleach from your skin. General instructions  Learn as much as you can about your disease so that you have an active role in your treatment. Work closely with your health care provider to find treatments that work for you.  If you are overweight, work with your health care provider to lose weight as recommended.  Do not use any products that contain nicotine or tobacco, such as cigarettes and e-cigarettes. If you need help quitting, ask your health care provider.  If you struggle  with living with this condition, talk with your health care provider or work with a mental health care provider as recommended.  Keep all follow-up visits as told by your health care provider. This is important. Where to find more information  Hidradenitis Ponderosa.: [email or web address] Contact a health care provider if you have:  A flare-up of hidradenitis suppurativa.  A fever or chills.  Trouble controlling your symptoms at home.  Trouble doing your daily activities because of  your symptoms.  Trouble dealing with emotional problems related to your condition. Summary  Hidradenitis suppurativa is a long-term (chronic) skin disease. It is similar to a severe form of acne, but it affects areas of the body where acne would be unusual.  The first symptoms are usually painful bumps in the skin, similar to pimples. The condition may get worse over time (progress), or it may only cause mild symptoms.  If you have open wounds, cover them with a clean dressing as told by your health care provider. Keep wounds clean by washing them gently with soap and water when you bathe.  Besides skin care, treatment may include medicines, laser treatment, and surgery. This information is not intended to replace advice given to you by your health care provider. Make sure you discuss any questions you have with your health care provider. Document Released: 09/12/2003 Document Revised: 02/05/2017 Document Reviewed: 02/05/2017 Elsevier Patient Education  2020 [employer].

## 2018-11-10 NOTE — Progress Notes (Signed)
[facility]  Sick Visit   Subjective:  Patient ID: Crystal Bullock, female    DOB: 1965/10/11  Age: 53 y.o. MRN: [id number]  CC:  Chief Complaint  Patient presents with   Follow-up    follow up , boil underarm     HPI Crystal Bullock is a 53 year old female who presents for Sick Visit  today.   Past Medical History:  Diagnosis Date   Anxiety 10/06/2015   Asthma    Atypical chest pain 10/06/2015   Fibroid    Hypertension    Palpitations 10/06/2015   RBBB 10/06/2015   Sarcoidosis    Scoliosis    Seasonal allergies    Sickle cell trait ([place])    Current Status: This will be Crystal Bullock's initial office visit with me. She was previously seeing Lanae Boast, NP for his PCP needs. Since her last office visit, she has complaints of recurrent skin infection bilaterally in [place]. She has not used any creams or ointments or taken any oral medications for relief of symptoms. She is doing well with no complaints. She denies fevers, chills, fatigue, recent infections, weight loss, and night sweats. She has not had any headaches, visual changes, dizziness, and falls. No chest pain, heart palpitations, cough and shortness of breath reported. No reports of GI problems such as nausea, vomiting, diarrhea, and constipation. She has no reports of blood in stools, dysuria and hematuria. No depression or anxiety, and denies suicidal ideations, homicidal ideations, or auditory hallucinations. She denies pain today.   Past Surgical History:  Procedure Laterality Date   AXILLARY LYMPH NODE DISSECTION     CHOLECYSTECTOMY      Family History  Problem Relation Age of Onset   Hypertension Mother    Diabetes Mother     Social History   Socioeconomic History   Marital status: Single    Spouse name: Not on file   Number of children: Not on file   Years of education: Not on file   Highest education level: Not on file    Occupational History   Not on file  Social Needs   Financial resource strain: Not on file   Food insecurity    Worry: Not on file    Inability: Not on file   Transportation needs    Medical: Not on file    Non-medical: Not on file  Tobacco Use   Smoking status: Never Smoker   Smokeless tobacco: Never Used  Substance and Sexual Activity   Alcohol use: Yes    Comment: occasional    Drug use: Yes    Types: Marijuana    Comment: once a week   Sexual activity: Yes    Birth control/protection: Condom  Lifestyle   Physical activity    Days per week: Not on file    Minutes per session: Not on file   Stress: Not on file  Relationships   Social connections    Talks on phone: Not on file    Gets together: Not on file    Attends religious service: Not on file    Active member of club or organization: Not on file    Attends meetings of clubs or organizations: Not on file    Relationship status: Not on file   Intimate partner violence    Fear of current or ex partner: Not on file    Emotionally abused: Not on file    Physically abused:  Not on file    Forced sexual activity: Not on file  Other Topics Concern   Not on file  Social History Narrative   Not on file    Outpatient Medications Prior to Visit  Medication Sig Dispense Refill   albuterol (PROVENTIL HFA;VENTOLIN HFA) 108 (90 Base) MCG/ACT inhaler Inhale 2 puffs into the lungs every 6 (six) hours as needed for wheezing (wheezing). 1 each 2   albuterol (PROVENTIL) (2.5 MG/3ML) 0.083% nebulizer solution Take 3 mLs (2.5 mg total) by nebulization every 6 (six) hours as needed for wheezing or shortness of breath. 75 mL 12   amLODipine (NORVASC) 10 MG tablet TAKE 1 TABLET(10 MG) BY MOUTH DAILY 60 tablet 3   busPIRone (BUSPAR) 7.5 MG tablet Take 1 tablet (7.5 mg total) by mouth 3 (three) times daily. 60 tablet 2   cetirizine (ZYRTEC) 10 MG tablet Take 1 tablet (10 mg total) by mouth daily as needed for  allergies. 30 tablet 2   clonazePAM (KLONOPIN) 1 MG tablet Take 1 mg by mouth 3 (three) times daily as needed for anxiety.     esomeprazole (NEXIUM) 40 MG capsule TAKE 1 CAPSULE(40 MG) BY MOUTH DAILY (Patient taking differently: Take 40 mg by mouth daily. ) 30 capsule 3   hydrOXYzine (VISTARIL) 50 MG capsule Take as prescribed as AVS. (Patient taking differently: Take 50 mg by mouth daily as needed for anxiety. ) 120 capsule 1   Multiple Vitamins-Minerals (MULTIVITAMINS THER. W/MINERALS) TABS Take 1 tablet by mouth daily.     ondansetron (ZOFRAN ODT) 4 MG disintegrating tablet Take 1 tablet (4 mg total) by mouth every 8 (eight) hours as needed for nausea or vomiting. 20 tablet 0   oxyCODONE-acetaminophen (PERCOCET) 10-325 MG tablet Take 1 tablet by mouth 3 (three) times daily as needed for pain.      hydrOXYzine (ATARAX/VISTARIL) 10 MG tablet Take 1 tablet (10 mg total) by mouth 3 (three) times daily as needed. 30 tablet 0   gabapentin (NEURONTIN) 300 MG capsule Take 300 mg by mouth 3 (three) times daily.     nystatin ointment (MYCOSTATIN) Apply 1 application topically 2 (two) times daily. (Patient not taking: Reported on 11/10/2018) 30 g 0   No facility-administered medications prior to visit.     Allergies  Allergen Reactions   Ivp Dye [Iodinated Diagnostic Agents]     Hives on hands and feet per pt 08/16/15 pt had scan w/ contrast and had a 4 hr premedication protocol done    ROS Review of Systems  Constitutional: Negative.   HENT: Negative.   Eyes: Negative.   Respiratory: Negative.   Cardiovascular: Negative.   Gastrointestinal: Negative.   Endocrine: Negative.   Genitourinary: Negative.   Musculoskeletal: Negative.   Allergic/Immunologic: Negative.   Neurological: Positive for headaches (occasional ).  Hematological: Negative.   Psychiatric/Behavioral: Negative.       Objective:    Physical Exam  Constitutional: She is oriented to person, place, and time. She  appears well-developed and well-nourished.  HENT:  Head: Normocephalic and atraumatic.  Eyes: Conjunctivae are normal.  Neck: Normal range of motion. Neck supple.  Cardiovascular: Normal rate, regular rhythm and normal heart sounds.  Pulmonary/Chest: Effort normal.  Abdominal: Soft.  Musculoskeletal: Normal range of motion.  Neurological: She is alert and oriented to person, place, and time. She has normal reflexes.  Skin: Skin is warm and dry.  hidradenitis suppra bilateral axilli.    Psychiatric: She has a normal mood and affect. Her behavior is  normal. Judgment and thought content normal.    BP (!) 151/87 (BP Location: Right Arm, Patient Position: Sitting, Cuff Size: Large)    Pulse 75    Temp 99.3 F (37.4 C) (Oral)    Ht 5\' 8"  (1.727 m)    SpO2 98%    BMI 36.49 kg/m  Wt Readings from Last 3 Encounters:  09/24/18 240 lb (108.9 kg)  08/24/18 244 lb (110.7 kg)  08/19/18 244 lb (110.7 kg)     Health Maintenance Due  Topic Date Due   COLONOSCOPY  10/07/2017   PAP SMEAR-Modifier  10/04/2018    There are no preventive care reminders to display for this patient.  Lab Results  Component Value Date   TSH 0.700 04/24/2017   Lab Results  Component Value Date   WBC 8.8 07/07/2018   HGB 11.6 (L) 07/07/2018   HCT 34.1 (L) 07/07/2018   MCV 73.2 (L) 07/07/2018   PLT 303 07/07/2018   Lab Results  Component Value Date   NA 138 07/07/2018   K 3.0 (L) 07/07/2018   CO2 23 07/07/2018   GLUCOSE 142 (H) 07/07/2018   BUN 10 07/07/2018   CREATININE 1.03 (H) 07/07/2018   BILITOT 0.7 09/14/2017   ALKPHOS 52 09/14/2017   AST 24 09/14/2017   ALT 22 09/14/2017   PROT 7.5 09/14/2017   ALBUMIN 3.6 09/14/2017   CALCIUM 9.3 07/07/2018   ANIONGAP 12 07/07/2018   No results found for: CHOL No results found for: HDL No results found for: LDLCALC No results found for: TRIG No results found for: CHOLHDL Lab Results  Component Value Date   HGBA1C 5.8 (A) 12/04/2017        Assessment & Plan:   1. Hidradenitis suppurativa - amoxicillin-clavulanate (AUGMENTIN) 875-125 MG tablet; Take 1 tablet by mouth 2 (two) times daily for 10 days.  Dispense: 20 tablet; Refill: 0  2. Yeast infection We will initiate antifungal today. - fluconazole (DIFLUCAN) 150 MG tablet; Take 1 tablet (150 mg total) by mouth once for 1 dose. Take additional dose if not effective.  Dispense: 2 tablet; Refill: 0  3. Follow up She will keep follow up appointment as scheduled.   Meds ordered this encounter  Medications   amoxicillin-clavulanate (AUGMENTIN) 875-125 MG tablet    Sig: Take 1 tablet by mouth 2 (two) times daily for 10 days.    Dispense:  20 tablet    Refill:  0   fluconazole (DIFLUCAN) 150 MG tablet    Sig: Take 1 tablet (150 mg total) by mouth once for 1 dose. Take additional dose if not effective.    Dispense:  2 tablet    Refill:  0    No orders of the defined types were placed in this encounter.  Referral Orders  No referral(s) requested today    Kathe Becton,  MSN, FNP-BC [place], [place] [address] [phone]- fax   Problem List Items Addressed This Visit      Musculoskeletal and Integument   Hidradenitis suppurativa - Primary   Relevant Medications   amoxicillin-clavulanate (AUGMENTIN) 875-125 MG tablet    Other Visit Diagnoses    Yeast infection       Follow up          Meds ordered this encounter  Medications   amoxicillin-clavulanate (AUGMENTIN) 875-125 MG tablet    Sig: Take 1 tablet by mouth 2 (two) times daily for  10 days.    Dispense:  20 tablet    Refill:  0   fluconazole (DIFLUCAN) 150 MG tablet    Sig: Take 1 tablet (150 mg total) by mouth once for 1 dose. Take additional dose if not effective.    Dispense:  2 tablet    Refill:  0    Follow-up: No follow-ups on file.    Azzie Glatter, FNP

## 2018-11-11 DIAGNOSIS — B379 Candidiasis, unspecified: Secondary | ICD-10-CM | POA: Insufficient documentation

## 2018-12-21 ENCOUNTER — Telehealth: Payer: Self-pay | Admitting: Internal Medicine

## 2018-12-21 ENCOUNTER — Telehealth: Payer: Self-pay

## 2018-12-21 DIAGNOSIS — B379 Candidiasis, unspecified: Secondary | ICD-10-CM

## 2018-12-21 MED ORDER — NYSTATIN 100000 UNIT/GM EX OINT: 1.0000 "application " | TOPICAL_OINTMENT | Freq: Two times a day (BID) | CUTANEOUS | 0 refills | Status: DC

## 2018-12-21 NOTE — Telephone Encounter (Signed)
Sent into pharmacy. Thanks!  

## 2018-12-21 NOTE — Telephone Encounter (Signed)
Patient is requesting a refill on abx for hydradenitis under arms. Please advise.

## 2018-12-21 NOTE — Telephone Encounter (Signed)
error 

## 2018-12-22 ENCOUNTER — Other Ambulatory Visit: Payer: Self-pay | Admitting: Family Medicine

## 2018-12-22 DIAGNOSIS — L732 Hidradenitis suppurativa: Secondary | ICD-10-CM

## 2018-12-22 MED ORDER — AMOXICILLIN-POT CLAVULANATE 875-125 MG PO TABS: 1.0000 | ORAL_TABLET | Freq: Two times a day (BID) | ORAL | 0 refills | Status: DC

## 2018-12-22 NOTE — Telephone Encounter (Signed)
Patient informed. 

## 2018-12-25 ENCOUNTER — Ambulatory Visit: Payer: Self-pay | Admitting: Family Medicine

## 2019-01-20 ENCOUNTER — Other Ambulatory Visit: Payer: Self-pay | Admitting: Family Medicine

## 2019-01-20 DIAGNOSIS — L732 Hidradenitis suppurativa: Secondary | ICD-10-CM

## 2019-01-20 MED ORDER — AMOXICILLIN-POT CLAVULANATE 875-125 MG PO TABS: 1.0000 | ORAL_TABLET | Freq: Two times a day (BID) | ORAL | 0 refills | Status: DC

## 2019-02-02 ENCOUNTER — Ambulatory Visit: Payer: Self-pay | Admitting: Family Medicine

## 2019-02-17 ENCOUNTER — Ambulatory Visit: Payer: Medicare Other | Attending: Internal Medicine

## 2019-02-17 DIAGNOSIS — Z20822 Contact with and (suspected) exposure to covid-19: Secondary | ICD-10-CM

## 2019-02-18 LAB — NOVEL CORONAVIRUS, NAA: SARS-CoV-2, NAA: NOT DETECTED

## 2019-02-18 NOTE — Progress Notes (Signed)
Virtual Visit via Telephone Note  I connected with Crystal Bullock on 02/19/19 at 10:00 AM EST by telephone and verified that I am speaking with the correct person using two identifiers.  Location: Patient: Home Provider: [facility] - [id number], [place], [place], [place] [address]   I discussed the limitations, risks, security and privacy concerns of performing an evaluation and management service by telephone and the availability of in person appointments. I also discussed with the patient that there may be a patient responsible charge related to this service. The patient expressed understanding and agreed to proceed.  Patient consented to consult via telephone: Yes People present and their role in pt care: Pt     History of Present Illness:  54 year old never tobacco smoker, regular marijuana smoker followed in our office for sarcoidosis as well as asthma  PMH: Hypertension, atypical chest pain Smoker/ Smoking History: Never tobacco smoker.  Patient regularly smokes marijuana. Maintenance: None Pt of: Patient of Dr. Elsworth Soho  Chief complaint: Follow-up  54 year old female never smoker followed in our office for sarcoidosis, asthma and atypical chest pain.  Patient complaining follow-up with her office today telephonically.  Patient reports that overall her breathing has been stable.  She continues to have ongoing bouts of anxiety.  This is also affecting her when she sleeps.   08/12/2018 home sleep study-AHI 8.2, SaO2 low 81%  Patient is interested in trialing CPAP therapy.  She feels that she is having difficulty sleeping.  She is ready to proceed forward with CPAP therapy.  Patient reports that her breathing has been at baseline.  She uses her rescue inhaler 2 times a day.  Uses her nebulizer about 2-3 times a month.  Sometimes she uses her rescue inhaler more.  Her breathing medications sometimes give her palpitations.  She is also working with primary care  regarding anxiety management.  She has pending referrals for psychiatry as well as therapy.  She reports she has an upcoming appointment with primary care this upcoming Monday.  Her anxiety has been worsened since December/2020 when her son was hospitalized and he has apparently had 5 surgeries.  Observations/Objective:  10/2015 Myoview low risk, EF 54%  11/2015 NPSG TST 196 minutes, AHI 0  CT angiogram 08/2015 negative PE /mediastinal lymph nodes  08/12/2018 home sleep study-AHI 8.2, SaO2 low 81%  Social History   Tobacco Use  Smoking Status Never Smoker  Smokeless Tobacco Never Used    There is no immunization history on file for this patient.   Assessment and Plan:  OSA (obstructive sleep apnea) Home sleep study shows mild obstructive sleep apnea  Plan: Start CPAP therapy >>> APAP 5-15, mask of choice, supplies 48-month follow-up with our office  GERD (gastroesophageal reflux disease) Plan: Continue Nexium Continue follow-up with primary care Continue to utilize lifestyle changes based off of previous recommendations Follow GERD diet  Anxiety Baseline anxiety Worsened with son's hospitalization in December/2020, patient reports he is status post 5 surgeries  Plan: Patient has upcoming follow-up with primary care next week, keep that appointment Discussed anxiety management options for the patient Likely would benefit from proceeding forward with previously recommended therapy and psychiatry referrals  Sarcoidosis [facility]) Plan: We will continue to monitor this clinically.  Asthma Plan: Continue to monitor clinically Okay to continue to use rescue inhaler or nebulizer every 6-8 hours for symptoms  Can consider trial of inhalers in the future at next in-person office visit   Follow Up Instructions:  Return  in about 2 months (around 04/19/2019), or if symptoms worsen or fail to improve, for Follow up with [name].   I discussed the assessment and  treatment plan with the patient. The patient was provided an opportunity to ask questions and all were answered. The patient agreed with the plan and demonstrated an understanding of the instructions.   The patient was advised to call back or seek an in-person evaluation if the symptoms worsen or if the condition fails to improve as anticipated.  I provided 23 minutes of non-face-to-face time during this encounter.   [name], NP

## 2019-02-19 ENCOUNTER — Encounter: Payer: Self-pay | Admitting: Pulmonary Disease

## 2019-02-19 ENCOUNTER — Other Ambulatory Visit: Payer: Self-pay

## 2019-02-19 ENCOUNTER — Ambulatory Visit (INDEPENDENT_AMBULATORY_CARE_PROVIDER_SITE_OTHER): Payer: Medicare Other | Admitting: Pulmonary Disease

## 2019-02-19 DIAGNOSIS — D869 Sarcoidosis, unspecified: Secondary | ICD-10-CM

## 2019-02-19 DIAGNOSIS — F419 Anxiety disorder, unspecified: Secondary | ICD-10-CM

## 2019-02-19 DIAGNOSIS — K219 Gastro-esophageal reflux disease without esophagitis: Secondary | ICD-10-CM | POA: Diagnosis not present

## 2019-02-19 DIAGNOSIS — J4520 Mild intermittent asthma, uncomplicated: Secondary | ICD-10-CM

## 2019-02-19 DIAGNOSIS — G4733 Obstructive sleep apnea (adult) (pediatric): Secondary | ICD-10-CM

## 2019-02-19 NOTE — Assessment & Plan Note (Signed)
Baseline anxiety Worsened with son's hospitalization in December/2020, patient reports he is status post 5 surgeries  Plan: Patient has upcoming follow-up with primary care next week, keep that appointment Discussed anxiety management options for the patient Likely would benefit from proceeding forward with previously recommended therapy and psychiatry referrals

## 2019-02-19 NOTE — Assessment & Plan Note (Signed)
Home sleep study shows mild obstructive sleep apnea  Plan: Start CPAP therapy >>> APAP 5-15, mask of choice, supplies 42-month follow-up with our office

## 2019-02-19 NOTE — Assessment & Plan Note (Signed)
Plan: Continue to monitor clinically Okay to continue to use rescue inhaler or nebulizer every 6-8 hours for symptoms  Can consider trial of inhalers in the future at next in-person office visit

## 2019-02-19 NOTE — Assessment & Plan Note (Signed)
Plan: We will continue to monitor this clinically.

## 2019-02-19 NOTE — Patient Instructions (Addendum)
You were seen today by [name], NP  for:   1. Mild intermittent asthma without complication Continue rescue inhaler and nebulized meds every 6-8 hours as needed for shortness of breath or wheezing  2. OSA (obstructive sleep apnea) We will start patient on CPAP  APAP setting 5-15 Mask of choice Supplies Patient needs 61-month follow-up with our office  We recommend that you continue using your CPAP daily >>>Keep up the hard work using your device >>> Goal should be wearing this for the entire night that you are sleeping, at least 4 to 6 hours  Remember:  . Do not drive or operate heavy machinery if tired or drowsy.  . Please notify the supply company and office if you are unable to use your device regularly due to missing supplies or machine being broken.  . Work on maintaining a healthy weight and following your recommended nutrition plan  . Maintain proper daily exercise and movement  . Maintaining proper use of your device can also help improve management of other chronic illnesses such as: Blood pressure, blood sugars, and weight management.   BiPAP/ CPAP Cleaning:  >>>Clean weekly, with Dawn soap, and bottle brush.  Set up to air dry. >>> Wipe mask out daily with wet wipe or towelette   3. Sarcoidosis  Continue to monitor this clinically for the patient  4. Gastroesophageal reflux disease, unspecified whether esophagitis present  Continue Nexium  GERD management: >>>Avoid laying flat until 2 hours after meals >>>Elevate head of the bed including entire chest >>>Reduce size of meals and amount of fat, acid, spices, caffeine and sweets >>>If you are smoking, Please stop! >>>Decrease alcohol consumption >>>Work on maintaining a healthy weight with normal BMI    5. Anxiety  Complete primary care follow-up next week Discuss with them your worsened anxiety since December/2020 given your son's health Discuss with primary care also that you feel like your anxiety is  not well controlled   Follow Up:    Return in about 2 months (around 04/19/2019), or if symptoms worsen or fail to improve, for Follow up with [name].   Please do your part to reduce the spread of COVID-19:      Reduce your risk of any infection  and COVID19 by using the similar precautions used for avoiding the common cold or flu:  Marland Kitchen Wash your hands often with soap and warm water for at least 20 seconds.  If soap and water are not readily available, use an alcohol-based hand sanitizer with at least 60% alcohol.  . If coughing or sneezing, cover your mouth and nose by coughing or sneezing into the elbow areas of your shirt or coat, into a tissue or into your sleeve (not your hands). Langley Gauss A MASK when in public  . Avoid shaking hands with others and consider head nods or verbal greetings only. . Avoid touching your eyes, nose, or mouth with unwashed hands.  . Avoid close contact with people who are sick. . Avoid places or events with large numbers of people in one location, like concerts or sporting events. . If you have some symptoms but not all symptoms, continue to monitor at home and seek medical attention if your symptoms worsen. . If you are having a medical emergency, call 911.   [facility] / e-Visit: [email or web address]         [facility]: [facility]: [phone]  [facility]: [phone]     It is flu season:   >>> Best ways to protect herself from the flu: Receive the yearly flu vaccine, practice good hand hygiene washing with soap and also using hand sanitizer when available, eat a nutritious meals, get adequate rest, hydrate appropriately   Please contact the office if your symptoms worsen or you have concerns that you are not improving.   Thank you for choosing [facility]  for your healthcare, and for allowing [place] to partner with you on your healthcare journey. I am thankful to be able to provide care to you today.   [name]

## 2019-02-19 NOTE — Assessment & Plan Note (Signed)
Plan: Continue Nexium Continue follow-up with primary care Continue to utilize lifestyle changes based off of previous recommendations Follow GERD diet

## 2019-02-22 ENCOUNTER — Other Ambulatory Visit: Payer: Self-pay

## 2019-02-22 ENCOUNTER — Ambulatory Visit (INDEPENDENT_AMBULATORY_CARE_PROVIDER_SITE_OTHER): Payer: Medicare Other | Admitting: Family Medicine

## 2019-02-22 ENCOUNTER — Encounter: Payer: Self-pay | Admitting: Family Medicine

## 2019-02-22 VITALS — BP 155/86 | HR 88 | Temp 98.0°F | Ht 68.0 in | Wt 247.2 lb

## 2019-02-22 DIAGNOSIS — R739 Hyperglycemia, unspecified: Secondary | ICD-10-CM | POA: Insufficient documentation

## 2019-02-22 DIAGNOSIS — R4589 Other symptoms and signs involving emotional state: Secondary | ICD-10-CM | POA: Insufficient documentation

## 2019-02-22 DIAGNOSIS — F419 Anxiety disorder, unspecified: Secondary | ICD-10-CM | POA: Diagnosis not present

## 2019-02-22 DIAGNOSIS — R7309 Other abnormal glucose: Secondary | ICD-10-CM | POA: Insufficient documentation

## 2019-02-22 DIAGNOSIS — L732 Hidradenitis suppurativa: Secondary | ICD-10-CM | POA: Diagnosis not present

## 2019-02-22 DIAGNOSIS — Z09 Encounter for follow-up examination after completed treatment for conditions other than malignant neoplasm: Secondary | ICD-10-CM

## 2019-02-22 DIAGNOSIS — R7303 Prediabetes: Secondary | ICD-10-CM | POA: Diagnosis not present

## 2019-02-22 DIAGNOSIS — Z789 Other specified health status: Secondary | ICD-10-CM

## 2019-02-22 DIAGNOSIS — R8290 Unspecified abnormal findings in urine: Secondary | ICD-10-CM

## 2019-02-22 LAB — POCT URINALYSIS DIPSTICK
Bilirubin, UA: NEGATIVE
Blood, UA: NEGATIVE
Glucose, UA: NEGATIVE
Ketones, UA: NEGATIVE
Nitrite, UA: NEGATIVE
Protein, UA: POSITIVE — AB
Spec Grav, UA: 1.02 (ref 1.010–1.025)
Urobilinogen, UA: 0.2 E.U./dL
pH, UA: 7 (ref 5.0–8.0)

## 2019-02-22 LAB — GLUCOSE, POCT (MANUAL RESULT ENTRY): POC Glucose: 158 mg/dl — AB (ref 70–99)

## 2019-02-22 LAB — POCT GLYCOSYLATED HEMOGLOBIN (HGB A1C): Hemoglobin A1C: 6.3 % — AB (ref 4.0–5.6)

## 2019-02-22 MED ORDER — AMOXICILLIN-POT CLAVULANATE 875-125 MG PO TABS: 1.0000 | ORAL_TABLET | Freq: Two times a day (BID) | ORAL | 0 refills | Status: DC

## 2019-02-22 NOTE — Addendum Note (Signed)
Addended by: Valerie Salts on: 02/22/2019 09:05 AM   Modules accepted: Orders

## 2019-02-22 NOTE — Patient Instructions (Signed)
Hidradenitis Suppurativa Hidradenitis suppurativa is a long-term (chronic) skin disease. It is similar to a severe form of acne, but it affects areas of the body where acne would be unusual, especially areas of the body where skin rubs against skin and becomes moist. These include:  Underarms.  Groin.  Genital area.  Buttocks.  Upper thighs.  Breasts. Hidradenitis suppurativa may start out as small lumps or pimples caused by blocked sweat glands or hair follicles. Pimples may develop into deep sores that break open (rupture) and drain pus. Over time, affected areas of skin may thicken and become scarred. This condition is rare and does not spread from person to person (non-contagious). What are the causes? The exact cause of this condition is not known. It may be related to:  Female and female hormones.  An overactive disease-fighting system (immune system). The immune system may over-react to blocked hair follicles or sweat glands and cause swelling and pus-filled sores. What increases the risk? You are more likely to develop this condition if you:  Are female.  Are 11-55 years old.  Have a family history of hidradenitis suppurativa.  Have a personal history of acne.  Are overweight.  Smoke.  Take the medicine lithium. What are the signs or symptoms? The first symptoms are usually painful bumps in the skin, similar to pimples. The condition may get worse over time (progress), or it may only cause mild symptoms. If the disease progresses, symptoms may include:  Skin bumps getting bigger and growing deeper into the skin.  Bumps rupturing and draining pus.  Itchy, infected skin.  Skin getting thicker and scarred.  Tunnels under the skin (fistulas) where pus drains from a bump.  Pain during daily activities, such as pain during walking if your groin area is affected.  Emotional problems, such as stress or depression. This condition may affect your appearance and your  ability or willingness to wear certain clothes or do certain activities. How is this diagnosed? This condition is diagnosed by a health care provider who specializes in skin diseases (dermatologist). You may be diagnosed based on:  Your symptoms and medical history.  A physical exam.  Testing a pus sample for infection.  Blood tests. How is this treated? Your treatment will depend on how severe your symptoms are. The same treatment will not work for everybody with this condition. You may need to try several treatments to find what works best for you. Treatment may include:  Cleaning and bandaging (dressing) your wounds as needed.  Lifestyle changes, such as new skin care routines.  Taking medicines, such as: ? Antibiotics. ? Acne medicines. ? Medicines to reduce the activity of the immune system. ? A diabetes medicine (metformin). ? Birth control pills, for women. ? Steroids to reduce swelling and pain.  Working with a mental health care provider, if you experience emotional distress due to this condition. If you have severe symptoms that do not get better with medicine, you may need surgery. Surgery may involve:  Using a laser to clear the skin and remove hair follicles.  Opening and draining deep sores.  Removing the areas of skin that are diseased and scarred. Follow these instructions at home: Medicines   Take over-the-counter and prescription medicines only as told by your health care provider.  If you were prescribed an antibiotic medicine, take it as told by your health care provider. Do not stop taking the antibiotic even if your condition improves. Skin care  If you have open wounds, cover   them with a clean dressing as told by your health care provider. Keep wounds clean by washing them gently with soap and water when you bathe.  Do not shave the areas where you get hidradenitis suppurativa.  Do not wear deodorant.  Wear loose-fitting clothes.  Try to avoid  getting overheated or sweaty. If you get sweaty or wet, change into clean, dry clothes as soon as you can.  To help relieve pain and itchiness, cover sore areas with a warm, clean washcloth (warm compress) for 5-10 minutes as often as needed.  If told by your health care provider, take a bleach bath twice a week: ? Fill your bathtub halfway with water. ? Pour in  cup of unscented household bleach. ? Soak in the tub for 5-10 minutes. ? Only soak from the neck down. Avoid water on your face and hair. ? Shower to rinse off the bleach from your skin. General instructions  Learn as much as you can about your disease so that you have an active role in your treatment. Work closely with your health care provider to find treatments that work for you.  If you are overweight, work with your health care provider to lose weight as recommended.  Do not use any products that contain nicotine or tobacco, such as cigarettes and e-cigarettes. If you need help quitting, ask your health care provider.  If you struggle with living with this condition, talk with your health care provider or work with a mental health care provider as recommended.  Keep all follow-up visits as told by your health care provider. This is important. Where to find more information  Hidradenitis [place].: https://www.hs-foundation.org/ Contact a health care provider if you have:  A flare-up of hidradenitis suppurativa.  A fever or chills.  Trouble controlling your symptoms at home.  Trouble doing your daily activities because of your symptoms.  Trouble dealing with emotional problems related to your condition. Summary  Hidradenitis suppurativa is a long-term (chronic) skin disease. It is similar to a severe form of acne, but it affects areas of the body where acne would be unusual.  The first symptoms are usually painful bumps in the skin, similar to pimples. The condition may get worse over time  (progress), or it may only cause mild symptoms.  If you have open wounds, cover them with a clean dressing as told by your health care provider. Keep wounds clean by washing them gently with soap and water when you bathe.  Besides skin care, treatment may include medicines, laser treatment, and surgery. This information is not intended to replace advice given to you by your health care provider. Make sure you discuss any questions you have with your health care provider. Document Revised: 02/05/2017 Document Reviewed: 02/05/2017 Elsevier Patient Education  [place]. Amoxicillin; Clavulanic Acid Chewable Tablets What is this medicine? AMOXICILLIN; CLAVULANIC ACID (a mox i SIL in; KLAV yoo lan ic AS id) is a penicillin antibiotic. It treats some infections caused by bacteria. It will not work for colds, the flu, or other viruses. This medicine may be used for other purposes; ask your health care provider or pharmacist if you have questions. COMMON BRAND NAME(S): Augmentin What should I tell my health care provider before I take this medicine? They need to know if you have any of these conditions:  bowel disease, like colitis  kidney disease  liver disease  mononucleosis  phenylketonuria  an unusual or allergic reaction to amoxicillin, penicillin, cephalosporin, other antibiotics,  clavulanic acid, other medicines, foods, dyes, or preservatives  pregnant or trying to get pregnant  breast-feeding How should I use this medicine? Take this drug by mouth. Take it as directed on the prescription label at the same time every day. Chew or crush it completely before swallowing. Do not swallow tablets whole. You can take it with or without food. If it upsets your stomach, take it with food. Take all of this drug unless your health care provider tells you to stop it early. Keep taking it even if you think you are better. Talk to your health care provider about the use of this drug in  children. While it may be prescribed for selected conditions, precautions do apply. Overdosage: If you think you have taken too much of this medicine contact a poison control center or emergency room at once. NOTE: This medicine is only for you. Do not share this medicine with others. What if I miss a dose? If you miss a dose, take it as soon as you can. If it is almost time for your next dose, take only that dose. Do not take double or extra doses. What may interact with this medicine?  allopurinol  anticoagulants  birth control pills  methotrexate  probenecid This list may not describe all possible interactions. Give your health care provider a list of all the medicines, herbs, non-prescription drugs, or dietary supplements you use. Also tell them if you smoke, drink alcohol, or use illegal drugs. Some items may interact with your medicine. What should I watch for while using this medicine? Tell your doctor or healthcare provider if your symptoms do not improve. This medicine may cause serious skin reactions. They can happen weeks to months after starting the medicine. Contact your healthcare provider right away if you notice fevers or flu-like symptoms with a rash. The rash may be red or purple and then turn into blisters or peeling of the skin. Or, you might notice a red rash with swelling of the face, lips or lymph nodes in your neck or under your arms. Do not treat diarrhea with over the counter products. Contact your doctor if you have diarrhea that lasts more than 2 days or if it is severe and watery. If you have diabetes, you may get a false-positive result for sugar in your urine. Check with your doctor or healthcare provider. Birth control pills may not work properly while you are taking this medicine. Talk to your doctor about using an extra method of birth control. What side effects may I notice from receiving this medicine? Side effects that you should report to your doctor or  health care professional as soon as possible:  allergic reactions like skin rash, itching or hives, swelling of the face, lips, or tongue  breathing problems  dark urine  fever or chills, sore throat  redness, blistering, peeling, or loosening of the skin, including inside the mouth  seizures  trouble passing urine or change in the amount of urine  unusual bleeding, bruising  unusually weak or tired  white patches or sores in the mouth or throat Side effects that usually do not require medical attention (report to your doctor or health care professional if they continue or are bothersome):  diarrhea  dizziness  headache  nausea, vomiting  stomach upset  vaginal or anal irritation This list may not describe all possible side effects. Call your doctor for medical advice about side effects. You may report side effects to FDA at 1-800-FDA-1088. Where  should I keep my medicine? Keep out of the reach of children and pets. Store at room temperature between 20 and 25 degrees C (68 and 77 degrees F). Throw away any unused drug after the expiration date. NOTE: This sheet is a summary. It may not cover all possible information. If you have questions about this medicine, talk to your doctor, pharmacist, or health care provider.  2020 Elsevier/Gold Standard (2018-10-12 08:58:41)

## 2019-02-22 NOTE — Progress Notes (Signed)
Patient [name]  Established Patient Office Visit   Subjective:  Patient ID: Crystal Bullock, female    DOB: [birth year]  Age: 54 y.o. MRN: [id number]  CC:  Chief Complaint  Patient presents with  . Follow-up    57mth follow up    HPI Crystal Bullock is a 54 year old female who presents for Follow Up today.   Past Medical History:  Diagnosis Date  . Anxiety 10/06/2015  . Asthma   . Atypical chest pain 10/06/2015  . Fibroid   . Hypertension   . Palpitations 10/06/2015  . RBBB 10/06/2015  . Sarcoidosis   . Scoliosis   . Seasonal allergies   . Sickle cell trait ([place])    Current Status: Since her last office visit, she is doing well with no complaints. She has c/o chronic Hidradenitis, which she contributes to increased stress. Her son is sick and has been hospitalized X 1 month. She is very tearful today concerning his condition. She denies suicidal ideations, homicidal ideations, or auditory hallucinations. She denies visual changes, chest pain, cough, shortness of breath, heart palpitations, and falls. She has occasional headaches and dizziness with position changes. Denies severe headaches, confusion, seizures, double vision, and blurred vision, nausea and vomiting. She denies fevers, chills, fatigue, recent infections, weight loss, and night sweats. No reports of GI problems such as diarrhea, and constipation. She has no reports of blood in stools, dysuria and hematuria. She has generalized pain today.she continues to be followed by pain.   Past Surgical History:  Procedure Laterality Date  . AXILLARY LYMPH NODE DISSECTION    . CHOLECYSTECTOMY      Family History  Problem Relation Age of Onset  . Hypertension Mother   . Diabetes Mother     Social History   Socioeconomic History  . Marital status: Single    Spouse name: Not on file  . Number of children: Not on file  . Years of education: Not on file  . Highest  education level: Not on file  Occupational History  . Not on file  Tobacco Use  . Smoking status: Never Smoker  . Smokeless tobacco: Never Used  Substance and Sexual Activity  . Alcohol use: Yes    Comment: occasional   . Drug use: Yes    Types: Marijuana    Comment: once a week  . Sexual activity: Yes    Birth control/protection: Condom  Other Topics Concern  . Not on file  Social History Narrative  . Not on file   Social Determinants of Health   Financial Resource Strain:   . Difficulty of Paying Living Expenses: Not on file  Food Insecurity:   . Worried About Charity fundraiser in the Last Year: Not on file  . Ran Out of Food in the Last Year: Not on file  Transportation Needs:   . Lack of Transportation (Medical): Not on file  . Lack of Transportation (Non-Medical): Not on file  Physical Activity:   . Days of Exercise per Week: Not on file  . Minutes of Exercise per Session: Not on file  Stress:   . Feeling of Stress : Not on file  Social Connections:   . Frequency of Communication with Friends and Family: Not on file  . Frequency of Social Gatherings with Friends and Family: Not on file  . Attends Religious Services: Not on file  . Active Member of Clubs or Organizations: Not on file  .  Attends Archivist Meetings: Not on file  . Marital Status: Not on file  Intimate Partner Violence:   . Fear of Current or Ex-Partner: Not on file  . Emotionally Abused: Not on file  . Physically Abused: Not on file  . Sexually Abused: Not on file    Outpatient Medications Prior to Visit  Medication Sig Dispense Refill  . albuterol (PROVENTIL HFA;VENTOLIN HFA) 108 (90 Base) MCG/ACT inhaler Inhale 2 puffs into the lungs every 6 (six) hours as needed for wheezing (wheezing). 1 each 2  . albuterol (PROVENTIL) (2.5 MG/3ML) 0.083% nebulizer solution Take 3 mLs (2.5 mg total) by nebulization every 6 (six) hours as needed for wheezing or shortness of breath. 75 mL 12  .  amLODipine (NORVASC) 10 MG tablet TAKE 1 TABLET(10 MG) BY MOUTH DAILY 60 tablet 3  . busPIRone (BUSPAR) 7.5 MG tablet Take 1 tablet (7.5 mg total) by mouth 3 (three) times daily. 60 tablet 2  . cetirizine (ZYRTEC) 10 MG tablet Take 1 tablet (10 mg total) by mouth daily as needed for allergies. 30 tablet 2  . clonazePAM (KLONOPIN) 1 MG tablet Take 1 mg by mouth 3 (three) times daily as needed for anxiety.    Marland Kitchen esomeprazole (NEXIUM) 40 MG capsule TAKE 1 CAPSULE(40 MG) BY MOUTH DAILY (Patient taking differently: Take 40 mg by mouth daily. ) 30 capsule 3  . gabapentin (NEURONTIN) 300 MG capsule Take 300 mg by mouth 3 (three) times daily.    . hydrOXYzine (VISTARIL) 50 MG capsule Take as prescribed as AVS. (Patient taking differently: Take 50 mg by mouth daily as needed for anxiety. ) 120 capsule 1  . Multiple Vitamins-Minerals (MULTIVITAMINS THER. W/MINERALS) TABS Take 1 tablet by mouth daily.    Marland Kitchen nystatin ointment (MYCOSTATIN) Apply 1 application topically 2 (two) times daily. 30 g 0  . ondansetron (ZOFRAN ODT) 4 MG disintegrating tablet Take 1 tablet (4 mg total) by mouth every 8 (eight) hours as needed for nausea or vomiting. 20 tablet 0  . oxyCODONE-acetaminophen (PERCOCET) 10-325 MG tablet Take 1 tablet by mouth 3 (three) times daily as needed for pain.      No facility-administered medications prior to visit.    Allergies  Allergen Reactions  . Ivp Dye [Iodinated Diagnostic Agents]     Hives on hands and feet per pt 08/16/15 pt had scan w/ contrast and had a 4 hr premedication protocol done    ROS Review of Systems  Constitutional: Negative.   HENT: Negative.   Eyes: Negative.   Respiratory: Negative.   Cardiovascular: Negative.   Gastrointestinal: Positive for abdominal distention.  Endocrine: Negative.   Genitourinary: Negative.   Musculoskeletal: Positive for arthralgias (generalized).  Skin:       Chronic Hidradenitis   Allergic/Immunologic: Negative.   Neurological:  Negative.   Hematological: Negative.   Psychiatric/Behavioral: The patient is nervous/anxious.        Tearful       Objective:    Physical Exam  Constitutional: She is oriented to person, place, and time. She appears well-developed.  HENT:  Head: Normocephalic and atraumatic.  Eyes: Conjunctivae are normal.  Cardiovascular: Normal rate, normal heart sounds and intact distal pulses.  Pulmonary/Chest: Effort normal.  Abdominal: Soft.  Musculoskeletal:        General: Normal range of motion.     Cervical back: Normal range of motion and neck supple.  Neurological: She is alert and oriented to person, place, and time. She has normal reflexes.  Skin: Skin is warm and dry.  Psychiatric: Her behavior is normal. Judgment and thought content normal.  Tearful and emotional today  Vitals reviewed.   BP (!) 155/86 Comment: Family Stress  Pulse 88   Temp 98 F (36.7 C)   Ht 5\' 8"  (1.727 m)   Wt 247 lb 3.2 oz (112.1 kg)   SpO2 97%   BMI 37.59 kg/m  Wt Readings from Last 3 Encounters:  02/22/19 247 lb 3.2 oz (112.1 kg)  09/24/18 240 lb (108.9 kg)  08/24/18 244 lb (110.7 kg)     Health Maintenance Due  Topic Date Due  . TETANUS/TDAP  10/08/1986  . COLONOSCOPY  10/07/2017  . PAP SMEAR-Modifier  10/04/2018    There are no preventive care reminders to display for this patient.  Lab Results  Component Value Date   TSH 0.700 04/24/2017   Lab Results  Component Value Date   WBC 8.8 07/07/2018   HGB 11.6 (L) 07/07/2018   HCT 34.1 (L) 07/07/2018   MCV 73.2 (L) 07/07/2018   PLT 303 07/07/2018   Lab Results  Component Value Date   NA 138 07/07/2018   K 3.0 (L) 07/07/2018   CO2 23 07/07/2018   GLUCOSE 142 (H) 07/07/2018   BUN 10 07/07/2018   CREATININE 1.03 (H) 07/07/2018   BILITOT 0.7 09/14/2017   ALKPHOS 52 09/14/2017   AST 24 09/14/2017   ALT 22 09/14/2017   PROT 7.5 09/14/2017   ALBUMIN 3.6 09/14/2017   CALCIUM 9.3 07/07/2018   ANIONGAP 12 07/07/2018   No  results found for: CHOL No results found for: HDL No results found for: LDLCALC No results found for: TRIG No results found for: CHOLHDL Lab Results  Component Value Date   HGBA1C 6.3 (A) 02/22/2019      Assessment & Plan:   1. Hidradenitis suppurativa We will initiate antibiotic today.  - amoxicillin-clavulanate (AUGMENTIN) 875-125 MG tablet; Take 1 tablet by mouth 2 (two) times daily for 20 days.  Dispense: 40 tablet; Refill: 0  2. Tearfulness  3. Anxiety - Ambulatory referral to Psychiatry  4. Prediabetes  5. Hemoglobin A1c < 7% Hgb A1c stable at 6.3 today.   6. Hyperglycemia Blood glucose at 158 today.  - Urinalysis Dipstick - POCT HgB A1C - Glucose (CBG)  5. Abnormal urinalysis Results are pending.  - Urine Culture  6. Under care of pain management specialist Continue to follow up with pain management as needed.   7. Follow up She will follow up in 3 months.   Meds ordered this encounter  Medications  . amoxicillin-clavulanate (AUGMENTIN) 875-125 MG tablet    Sig: Take 1 tablet by mouth 2 (two) times daily for 20 days.    Dispense:  40 tablet    Refill:  0    Orders Placed This Encounter  Procedures  . Urine Culture  . Ambulatory referral to Psychiatry  . Urinalysis Dipstick  . POCT HgB A1C  . Glucose (CBG)     Referral Orders     Ambulatory referral to Psychiatry   Kathe Becton,  MSN, FNP-BC [address] [place], [place] [address] [phone] fax    Problem List Items Addressed This Visit      Musculoskeletal and Integument   Hidradenitis suppurativa - Primary   Relevant Medications   amoxicillin-clavulanate (AUGMENTIN) 875-125 MG tablet     Other   Anxiety   Relevant Orders  Ambulatory referral to Psychiatry    Other Visit Diagnoses    Tearfulness       Hyperglycemia       Relevant Orders   Urinalysis Dipstick (Completed)     POCT HgB A1C (Completed)   Glucose (CBG) (Completed)   Abnormal urinalysis       Relevant Orders   Urine Culture   Under care of pain management specialist       Follow up          Meds ordered this encounter  Medications  . amoxicillin-clavulanate (AUGMENTIN) 875-125 MG tablet    Sig: Take 1 tablet by mouth 2 (two) times daily for 20 days.    Dispense:  40 tablet    Refill:  0    Follow-up: No follow-ups on file.    Azzie Glatter, FNP

## 2019-02-24 LAB — URINE CULTURE

## 2019-03-03 ENCOUNTER — Other Ambulatory Visit: Payer: Self-pay | Admitting: Family Medicine

## 2019-03-03 DIAGNOSIS — I10 Essential (primary) hypertension: Secondary | ICD-10-CM

## 2019-03-11 ENCOUNTER — Other Ambulatory Visit: Payer: Self-pay | Admitting: Family Medicine

## 2019-03-11 ENCOUNTER — Telehealth: Payer: Self-pay

## 2019-03-11 DIAGNOSIS — L732 Hidradenitis suppurativa: Secondary | ICD-10-CM

## 2019-03-11 NOTE — Telephone Encounter (Signed)
Refill request for Augmentin. Per  it taken for Hidradenitis. Area is improving but needs a refill.

## 2019-03-11 NOTE — Telephone Encounter (Signed)
Patient called and is asking for a refill for Augmentin, she states she has another boil up under her arm.

## 2019-03-12 NOTE — Telephone Encounter (Signed)
Called and spoke with patient, advised that rx for augmentin has been sent to pharmacy. Thanks!

## 2019-03-24 ENCOUNTER — Ambulatory Visit (HOSPITAL_COMMUNITY): Payer: Medicare Other | Admitting: Psychiatry

## 2019-04-07 ENCOUNTER — Other Ambulatory Visit: Payer: Self-pay

## 2019-04-07 ENCOUNTER — Encounter: Payer: Self-pay | Admitting: Nurse Practitioner

## 2019-04-07 ENCOUNTER — Ambulatory Visit (INDEPENDENT_AMBULATORY_CARE_PROVIDER_SITE_OTHER): Payer: Medicare Other | Admitting: Nurse Practitioner

## 2019-04-07 DIAGNOSIS — F419 Anxiety disorder, unspecified: Secondary | ICD-10-CM

## 2019-04-07 DIAGNOSIS — D869 Sarcoidosis, unspecified: Secondary | ICD-10-CM | POA: Diagnosis not present

## 2019-04-07 DIAGNOSIS — J4520 Mild intermittent asthma, uncomplicated: Secondary | ICD-10-CM | POA: Diagnosis not present

## 2019-04-07 DIAGNOSIS — B379 Candidiasis, unspecified: Secondary | ICD-10-CM

## 2019-04-07 DIAGNOSIS — K219 Gastro-esophageal reflux disease without esophagitis: Secondary | ICD-10-CM | POA: Diagnosis not present

## 2019-04-07 DIAGNOSIS — J309 Allergic rhinitis, unspecified: Secondary | ICD-10-CM

## 2019-04-07 DIAGNOSIS — L732 Hidradenitis suppurativa: Secondary | ICD-10-CM

## 2019-04-07 DIAGNOSIS — I10 Essential (primary) hypertension: Secondary | ICD-10-CM

## 2019-04-07 MED ORDER — ESOMEPRAZOLE MAGNESIUM 40 MG PO CPDR: DELAYED_RELEASE_CAPSULE | ORAL | 3 refills | Status: DC

## 2019-04-07 MED ORDER — CETIRIZINE HCL 10 MG PO TABS: 10.0000 mg | ORAL_TABLET | Freq: Every day | ORAL | 2 refills | Status: DC | PRN

## 2019-04-07 MED ORDER — HYDROXYZINE PAMOATE 50 MG PO CAPS: ORAL_CAPSULE | ORAL | 1 refills | Status: DC

## 2019-04-07 MED ORDER — ALBUTEROL SULFATE HFA 108 (90 BASE) MCG/ACT IN AERS: 2.0000 | INHALATION_SPRAY | Freq: Four times a day (QID) | RESPIRATORY_TRACT | 0 refills | Status: DC | PRN

## 2019-04-07 MED ORDER — ALBUTEROL SULFATE (2.5 MG/3ML) 0.083% IN NEBU: 2.5000 mg | INHALATION_SOLUTION | Freq: Four times a day (QID) | RESPIRATORY_TRACT | 12 refills | Status: DC | PRN

## 2019-04-07 MED ORDER — AMLODIPINE BESYLATE 10 MG PO TABS: 10.0000 mg | ORAL_TABLET | Freq: Every day | ORAL | 1 refills | Status: DC

## 2019-04-07 MED ORDER — ONDANSETRON 4 MG PO TBDP: 4.0000 mg | ORAL_TABLET | Freq: Three times a day (TID) | ORAL | 0 refills | Status: DC | PRN

## 2019-04-07 MED ORDER — NYSTATIN 100000 UNIT/GM EX OINT: 1.0000 "application " | TOPICAL_OINTMENT | Freq: Two times a day (BID) | CUTANEOUS | 0 refills | Status: DC

## 2019-04-07 MED ORDER — AMOXICILLIN-POT CLAVULANATE 875-125 MG PO TABS: ORAL_TABLET | ORAL | 0 refills | Status: DC

## 2019-04-07 NOTE — Progress Notes (Signed)
Established Patient Office Visit  Subjective:  Patient ID: Crystal Bullock, female    DOB: 06-Nov-1965  Age: 54 y.o. MRN: [id number]  CC:  Chief Complaint  Patient presents with  . Hypertension  . Diabetes  . Medication Refill    zofran, clonazepam, nebulizer solution, inhaler, amlodipine, ointment   . Recurrent Skin Infections    in mid back    HPI Crystal Bullock presents for follow-up.  She  has a past medical history of Anxiety (10/06/2015), Asthma, Atypical chest pain (10/06/2015), Fibroid, Hypertension, Palpitations (10/06/2015), RBBB (10/06/2015), Sarcoidosis, Scoliosis, Seasonal allergies, and Sickle cell trait ([place]).  She was a previous patient of Crystal Boast, FNP.  She admits that today she is here for medication refills. Denies headache, dizziness, visual changes, shortness of breath, dyspnea on exertion, chest pain, nausea, vomiting or any edema.  She is concerned hidradenitis supprativa.  She is status post surgery of lymph nodes in bilateral axillary regions.  She admits that this has been beneficial however she does continue to have flareups. One area on the right near her breast and on her back.  She admits that she is having a slight flare today.  It comes and goes.  "Augmentin is goes in and cleans out the poison".  She has been having this on and off since the age of 63.  Past Medical History:  Diagnosis Date  . Anxiety 10/06/2015  . Asthma   . Atypical chest pain 10/06/2015  . Fibroid   . Hypertension   . Palpitations 10/06/2015  . RBBB 10/06/2015  . Sarcoidosis   . Scoliosis   . Seasonal allergies   . Sickle cell trait [facility])     Past Surgical History:  Procedure Laterality Date  . AXILLARY LYMPH NODE DISSECTION    . CHOLECYSTECTOMY      Family History  Problem Relation Age of Onset  . Hypertension Mother   . Diabetes Mother     Social History   Socioeconomic History  . Marital status: Single    Spouse name: Not on file  . Number of  children: Not on file  . Years of education: Not on file  . Highest education level: Not on file  Occupational History  . Not on file  Tobacco Use  . Smoking status: Never Smoker  . Smokeless tobacco: Never Used  Substance and Sexual Activity  . Alcohol use: Yes    Comment: occasional   . Drug use: Yes    Types: Marijuana    Comment: once a week  . Sexual activity: Yes    Birth control/protection: Condom  Other Topics Concern  . Not on file  Social History Narrative  . Not on file   Social Determinants of Health   Financial Resource Strain:   . Difficulty of Paying Living Expenses: Not on file  Food Insecurity:   . Worried About Charity fundraiser in the Last Year: Not on file  . Ran Out of Food in the Last Year: Not on file  Transportation Needs:   . Lack of Transportation (Medical): Not on file  . Lack of Transportation (Non-Medical): Not on file  Physical Activity:   . Days of Exercise per Week: Not on file  . Minutes of Exercise per Session: Not on file  Stress:   . Feeling of Stress : Not on file  Social Connections:   . Frequency of Communication with Friends and Family: Not on file  . Frequency of Social Gatherings with  Friends and Family: Not on file  . Attends Religious Services: Not on file  . Active Member of Clubs or Organizations: Not on file  . Attends Archivist Meetings: Not on file  . Marital Status: Not on file  Intimate Partner Violence:   . Fear of Current or Ex-Partner: Not on file  . Emotionally Abused: Not on file  . Physically Abused: Not on file  . Sexually Abused: Not on file    Outpatient Medications Prior to Visit  Medication Sig Dispense Refill  . Multiple Vitamins-Minerals (MULTIVITAMINS THER. W/MINERALS) TABS Take 1 tablet by mouth daily.    Marland Kitchen oxyCODONE-acetaminophen (PERCOCET) 10-325 MG tablet Take 1 tablet by mouth 3 (three) times daily as needed for pain.     Marland Kitchen albuterol (PROVENTIL HFA;VENTOLIN HFA) 108 (90 Base)  MCG/ACT inhaler Inhale 2 puffs into the lungs every 6 (six) hours as needed for wheezing (wheezing). 1 each 2  . albuterol (PROVENTIL) (2.5 MG/3ML) 0.083% nebulizer solution Take 3 mLs (2.5 mg total) by nebulization every 6 (six) hours as needed for wheezing or shortness of breath. 75 mL 12  . amLODipine (NORVASC) 10 MG tablet TAKE 1 TABLET(10 MG) BY MOUTH DAILY 60 tablet 3  . cetirizine (ZYRTEC) 10 MG tablet Take 1 tablet (10 mg total) by mouth daily as needed for allergies. 30 tablet 2  . esomeprazole (NEXIUM) 40 MG capsule TAKE 1 CAPSULE(40 MG) BY MOUTH DAILY (Patient taking differently: Take 40 mg by mouth daily. ) 30 capsule 3  . hydrOXYzine (VISTARIL) 50 MG capsule Take as prescribed as AVS. (Patient taking differently: Take 50 mg by mouth daily as needed for anxiety. ) 120 capsule 1  . nystatin ointment (MYCOSTATIN) Apply 1 application topically 2 (two) times daily. 30 g 0  . ondansetron (ZOFRAN ODT) 4 MG disintegrating tablet Take 1 tablet (4 mg total) by mouth every 8 (eight) hours as needed for nausea or vomiting. 20 tablet 0  . busPIRone (BUSPAR) 7.5 MG tablet Take 1 tablet (7.5 mg total) by mouth 3 (three) times daily. (Patient not taking: Reported on 04/07/2019) 60 tablet 2  . gabapentin (NEURONTIN) 300 MG capsule Take 300 mg by mouth 3 (three) times daily.    Marland Kitchen amoxicillin-clavulanate (AUGMENTIN) 875-125 MG tablet TAKE 1 TABLET BY MOUTH TWICE DAILY FOR 20 DAYS 40 tablet 0   No facility-administered medications prior to visit.    Allergies  Allergen Reactions  . Ivp Dye [Iodinated Diagnostic Agents]     Hives on hands and feet per pt 08/16/15 pt had scan w/ contrast and had a 4 hr premedication protocol done    ROS Review of Systems  HENT: Negative.   Eyes: Negative.   Respiratory: Negative.   Cardiovascular: Negative.   Gastrointestinal: Positive for constipation.  Endocrine: Negative.   Genitourinary: Negative.   Musculoskeletal: Negative.   Skin: Negative.     Allergic/Immunologic: Negative.   Neurological: Positive for dizziness.  Hematological: Negative.   Psychiatric/Behavioral: Negative.       Objective:    Physical Exam  Constitutional: She is oriented to person, place, and time.  HENT:  Head: Normocephalic.  Cardiovascular: Normal rate, regular rhythm and normal heart sounds.  Pulmonary/Chest: Effort normal and breath sounds normal.  Abdominal: Soft. Bowel sounds are normal.  Musculoskeletal:        General: Normal range of motion.     Cervical back: Normal range of motion.  Neurological: She is alert and oriented to person, place, and time.  Skin:  Psychiatric: She has a normal mood and affect. Her behavior is normal. Judgment and thought content normal.    BP (!) 148/90 (BP Location: Left Arm, Patient Position: Sitting, Cuff Size: Large) Comment: manuallyu  Pulse 70   Temp 98.3 F (36.8 C) (Oral)   Resp 16   Ht 5\' 8"  (1.727 m)   Wt 245 lb (111.1 kg)   SpO2 100%   BMI 37.25 kg/m  Wt Readings from Last 3 Encounters:  04/07/19 245 lb (111.1 kg)  02/22/19 247 lb 3.2 oz (112.1 kg)  09/24/18 240 lb (108.9 kg)     Health Maintenance Due  Topic Date Due  . TETANUS/TDAP  10/08/1986  . COLONOSCOPY  10/07/2017  . PAP SMEAR-Modifier  10/04/2018    There are no preventive care reminders to display for this patient.  Lab Results  Component Value Date   TSH 0.700 04/24/2017   Lab Results  Component Value Date   WBC 8.8 07/07/2018   HGB 11.6 (L) 07/07/2018   HCT 34.1 (L) 07/07/2018   MCV 73.2 (L) 07/07/2018   PLT 303 07/07/2018   Lab Results  Component Value Date   NA 138 07/07/2018   K 3.0 (L) 07/07/2018   CO2 23 07/07/2018   GLUCOSE 142 (H) 07/07/2018   BUN 10 07/07/2018   CREATININE 1.03 (H) 07/07/2018   BILITOT 0.7 09/14/2017   ALKPHOS 52 09/14/2017   AST 24 09/14/2017   ALT 22 09/14/2017   PROT 7.5 09/14/2017   ALBUMIN 3.6 09/14/2017   CALCIUM 9.3 07/07/2018   ANIONGAP 12 07/07/2018   No  results found for: CHOL No results found for: HDL No results found for: LDLCALC No results found for: TRIG No results found for: CHOLHDL Lab Results  Component Value Date   HGBA1C 6.3 (A) 02/22/2019      Assessment & Plan:   Problem List Items Addressed This Visit      Unprioritized   Allergic rhinitis   Relevant Medications   hydrOXYzine (VISTARIL) 50 MG capsule   cetirizine (ZYRTEC) 10 MG tablet   Anxiety   Relevant Medications   hydrOXYzine (VISTARIL) 50 MG capsule   Asthma   Relevant Medications   albuterol (VENTOLIN HFA) 108 (90 Base) MCG/ACT inhaler   Essential hypertension   Relevant Medications   amLODipine (NORVASC) 10 MG tablet   GERD (gastroesophageal reflux disease)   Relevant Medications   esomeprazole (NEXIUM) 40 MG capsule   ondansetron (ZOFRAN ODT) 4 MG disintegrating tablet   Hidradenitis suppurativa   Relevant Medications   nystatin ointment (MYCOSTATIN)   amoxicillin-clavulanate (AUGMENTIN) 875-125 MG tablet   Sarcoidosis   Relevant Medications   albuterol (VENTOLIN HFA) 108 (90 Base) MCG/ACT inhaler    Other Visit Diagnoses    Candidiasis       Relevant Medications   nystatin ointment (MYCOSTATIN)      Meds ordered this encounter  Medications  . albuterol (VENTOLIN HFA) 108 (90 Base) MCG/ACT inhaler    Sig: Inhale 2 puffs into the lungs every 6 (six) hours as needed for up to 90 doses for wheezing (wheezing).    Dispense:  6.7 g    Refill:  0    Order Specific Question:   Supervising Provider    Answer:   Tresa Garter [id number]  . DISCONTD: albuterol (PROVENTIL) (2.5 MG/3ML) 0.083% nebulizer solution    Sig: Take 3 mLs (2.5 mg total) by nebulization every 6 (six) hours as needed for wheezing or shortness of breath.  Dispense:  75 mL    Refill:  12    Order Specific Question:   Supervising Provider    Answer:   Tresa Garter [id number]  . amLODipine (NORVASC) 10 MG tablet    Sig: Take 1 tablet (10 mg total) by mouth  daily.    Dispense:  90 tablet    Refill:  1    Order Specific Question:   Supervising Provider    Answer:   Tresa Garter [id number]  . esomeprazole (NEXIUM) 40 MG capsule    Sig: TAKE 1 CAPSULE(40 MG) BY MOUTH DAILY    Dispense:  30 capsule    Refill:  3    Order Specific Question:   Supervising Provider    Answer:   Tresa Garter [id number]  . hydrOXYzine (VISTARIL) 50 MG capsule    Sig: Take as prescribed as AVS.    Dispense:  120 capsule    Refill:  1    Order Specific Question:   Supervising Provider    Answer:   Tresa Garter [id number]  . nystatin ointment (MYCOSTATIN)    Sig: Apply 1 application topically 2 (two) times daily.    Dispense:  30 g    Refill:  0    Order Specific Question:   Supervising Provider    Answer:   Tresa Garter [id number]  . ondansetron (ZOFRAN ODT) 4 MG disintegrating tablet    Sig: Take 1 tablet (4 mg total) by mouth every 8 (eight) hours as needed for nausea or vomiting.    Dispense:  20 tablet    Refill:  0    Order Specific Question:   Supervising Provider    Answer:   Tresa Garter [id number]  . cetirizine (ZYRTEC) 10 MG tablet    Sig: Take 1 tablet (10 mg total) by mouth daily as needed for allergies.    Dispense:  30 tablet    Refill:  2    Order Specific Question:   Supervising Provider    Answer:   Tresa Garter [id number]  . amoxicillin-clavulanate (AUGMENTIN) 875-125 MG tablet    Sig: TAKE 1 TABLET BY MOUTH TWICE DAILY FOR 20 DAYS    Dispense:  40 tablet    Refill:  0    Order Specific Question:   Supervising Provider    Answer:   Tresa Garter [id number]    Follow-up: Return in about 3 months (around 07/05/2019).  Patient requested to continue with care under Belva Crome, FNP because she is familiar with her history.  Vevelyn Francois, NP

## 2019-04-12 ENCOUNTER — Ambulatory Visit (HOSPITAL_COMMUNITY): Payer: Medicare Other | Admitting: Psychiatry

## 2019-04-12 ENCOUNTER — Other Ambulatory Visit: Payer: Self-pay

## 2019-05-05 DIAGNOSIS — M5136 Other intervertebral disc degeneration, lumbar region: Secondary | ICD-10-CM | POA: Diagnosis not present

## 2019-05-05 DIAGNOSIS — Z79899 Other long term (current) drug therapy: Secondary | ICD-10-CM | POA: Diagnosis not present

## 2019-05-05 DIAGNOSIS — G894 Chronic pain syndrome: Secondary | ICD-10-CM | POA: Diagnosis not present

## 2019-05-10 ENCOUNTER — Ambulatory Visit: Payer: Self-pay | Admitting: Family Medicine

## 2019-05-13 DIAGNOSIS — G4733 Obstructive sleep apnea (adult) (pediatric): Secondary | ICD-10-CM | POA: Diagnosis not present

## 2019-05-13 DIAGNOSIS — E559 Vitamin D deficiency, unspecified: Secondary | ICD-10-CM

## 2019-05-13 HISTORY — DX: Vitamin D deficiency, unspecified: E55.9

## 2019-05-18 ENCOUNTER — Ambulatory Visit: Payer: Self-pay | Admitting: Family Medicine

## 2019-05-24 ENCOUNTER — Ambulatory Visit: Payer: Self-pay | Admitting: Nurse Practitioner

## 2019-05-25 ENCOUNTER — Ambulatory Visit: Payer: Self-pay | Admitting: Family Medicine

## 2019-05-27 DIAGNOSIS — G4733 Obstructive sleep apnea (adult) (pediatric): Secondary | ICD-10-CM | POA: Diagnosis not present

## 2019-06-08 ENCOUNTER — Other Ambulatory Visit: Payer: Self-pay

## 2019-06-08 ENCOUNTER — Ambulatory Visit (INDEPENDENT_AMBULATORY_CARE_PROVIDER_SITE_OTHER): Payer: Medicare Other | Admitting: Family Medicine

## 2019-06-08 ENCOUNTER — Encounter: Payer: Self-pay | Admitting: Family Medicine

## 2019-06-08 VITALS — BP 144/77 | HR 68 | Temp 99.0°F | Ht 68.0 in | Wt 249.2 lb

## 2019-06-08 DIAGNOSIS — I10 Essential (primary) hypertension: Secondary | ICD-10-CM | POA: Diagnosis not present

## 2019-06-08 DIAGNOSIS — L732 Hidradenitis suppurativa: Secondary | ICD-10-CM

## 2019-06-08 DIAGNOSIS — R7303 Prediabetes: Secondary | ICD-10-CM | POA: Diagnosis not present

## 2019-06-08 DIAGNOSIS — R809 Proteinuria, unspecified: Secondary | ICD-10-CM | POA: Diagnosis not present

## 2019-06-08 DIAGNOSIS — Z09 Encounter for follow-up examination after completed treatment for conditions other than malignant neoplasm: Secondary | ICD-10-CM

## 2019-06-08 DIAGNOSIS — F419 Anxiety disorder, unspecified: Secondary | ICD-10-CM

## 2019-06-08 DIAGNOSIS — R739 Hyperglycemia, unspecified: Secondary | ICD-10-CM

## 2019-06-08 DIAGNOSIS — M542 Cervicalgia: Secondary | ICD-10-CM

## 2019-06-08 DIAGNOSIS — R7309 Other abnormal glucose: Secondary | ICD-10-CM | POA: Diagnosis not present

## 2019-06-08 DIAGNOSIS — G8929 Other chronic pain: Secondary | ICD-10-CM

## 2019-06-08 DIAGNOSIS — D869 Sarcoidosis, unspecified: Secondary | ICD-10-CM

## 2019-06-08 LAB — POCT URINALYSIS DIPSTICK
Bilirubin, UA: NEGATIVE
Blood, UA: NEGATIVE
Glucose, UA: NEGATIVE
Ketones, UA: NEGATIVE
Leukocytes, UA: NEGATIVE
Nitrite, UA: NEGATIVE
Protein, UA: POSITIVE — AB
Spec Grav, UA: 1.015 (ref 1.010–1.025)
Urobilinogen, UA: 0.2 E.U./dL
pH, UA: 6 (ref 5.0–8.0)

## 2019-06-08 LAB — POCT GLYCOSYLATED HEMOGLOBIN (HGB A1C): Hemoglobin A1C: 6.7 % — AB (ref 4.0–5.6)

## 2019-06-08 LAB — GLUCOSE, POCT (MANUAL RESULT ENTRY): POC Glucose: 142 mg/dl — AB (ref 70–99)

## 2019-06-08 NOTE — Progress Notes (Signed)
[facility]   Established Patient Office Visit  Subjective:  Patient ID: Crystal Bullock, female    DOB: [birth year]  Age: 54 y.o. MRN: [id number]  CC:  Chief Complaint  Patient presents with  . Follow-up    Fatigue    HPI Crystal Bullock is a 54 year old female who presents for Follow Up today.   Past Medical History:  Diagnosis Date  . Anxiety 10/06/2015  . Asthma   . Atypical chest pain 10/06/2015  . Fibroid   . Hypertension   . Palpitations 10/06/2015  . RBBB 10/06/2015  . Sarcoidosis   . Scoliosis   . Seasonal allergies   . Sickle cell trait ([place])    Current Status: Since her last office visit, she is doing well with no complaints. She has c/o neck pain and discomfort. She continues to follow up with Pulmonology for Sarcoidosis. She denies visual changes, chest pain, cough, shortness of breath, heart palpitations, and falls. She has occasional headaches and dizziness with position changes. Denies severe headaches, confusion, seizures, double vision, and blurred vision, nausea and vomiting. She states that she has history of neck swelling and pain. She denies fevers, chills, fatigue, recent infections, weight loss, and night sweats. Denies GI problems such as nausea, vomiting, diarrhea, and constipation. She has no reports of blood in stools, dysuria and hematuria. No depression or anxiety reported today. She denies suicidal ideations, homicidal ideations, or auditory hallucinations. She is taking all medications as prescribed.   Past Surgical History:  Procedure Laterality Date  . AXILLARY LYMPH NODE DISSECTION    . CHOLECYSTECTOMY      Family History  Problem Relation Age of Onset  . Hypertension Mother   . Diabetes Mother     Social History   Socioeconomic History  . Marital status: Single    Spouse name: Not on file  . Number of children: Not on file  . Years of education: Not on file  . Highest  education level: Not on file  Occupational History  . Not on file  Tobacco Use  . Smoking status: Never Smoker  . Smokeless tobacco: Never Used  Substance and Sexual Activity  . Alcohol use: Yes    Comment: occasional   . Drug use: Yes    Types: Marijuana    Comment: once a week  . Sexual activity: Yes    Birth control/protection: Condom  Other Topics Concern  . Not on file  Social History Narrative  . Not on file   Social Determinants of Health   Financial Resource Strain:   . Difficulty of Paying Living Expenses:   Food Insecurity:   . Worried About Charity fundraiser in the Last Year:   . Arboriculturist in the Last Year:   Transportation Needs:   . Film/video editor (Medical):   Marland Kitchen Lack of Transportation (Non-Medical):   Physical Activity:   . Days of Exercise per Week:   . Minutes of Exercise per Session:   Stress:   . Feeling of Stress :   Social Connections:   . Frequency of Communication with Friends and Family:   . Frequency of Social Gatherings with Friends and Family:   . Attends Religious Services:   . Active Member of Clubs or Organizations:   . Attends Archivist Meetings:   Marland Kitchen Marital Status:   Intimate Partner Violence:   . Fear of Current or Ex-Partner:   .  Emotionally Abused:   Marland Kitchen Physically Abused:   . Sexually Abused:     Outpatient Medications Prior to Visit  Medication Sig Dispense Refill  . albuterol (PROVENTIL) (2.5 MG/3ML) 0.083% nebulizer solution Take 3 mLs (2.5 mg total) by nebulization every 6 (six) hours as needed for wheezing or shortness of breath. 75 mL 12  . albuterol (VENTOLIN HFA) 108 (90 Base) MCG/ACT inhaler Inhale 2 puffs into the lungs every 6 (six) hours as needed for up to 90 doses for wheezing (wheezing). 6.7 g 0  . amLODipine (NORVASC) 10 MG tablet Take 1 tablet (10 mg total) by mouth daily. 90 tablet 1  . cetirizine (ZYRTEC) 10 MG tablet Take 1 tablet (10 mg total) by mouth daily as needed for allergies. 30  tablet 2  . esomeprazole (NEXIUM) 40 MG capsule TAKE 1 CAPSULE(40 MG) BY MOUTH DAILY 30 capsule 3  . gabapentin (NEURONTIN) 300 MG capsule Take 300 mg by mouth 3 (three) times daily.    . hydrOXYzine (VISTARIL) 50 MG capsule Take as prescribed as AVS. 120 capsule 1  . Multiple Vitamins-Minerals (MULTIVITAMINS THER. W/MINERALS) TABS Take 1 tablet by mouth daily.    Marland Kitchen nystatin ointment (MYCOSTATIN) Apply 1 application topically 2 (two) times daily. 30 g 0  . ondansetron (ZOFRAN ODT) 4 MG disintegrating tablet Take 1 tablet (4 mg total) by mouth every 8 (eight) hours as needed for nausea or vomiting. 20 tablet 0  . busPIRone (BUSPAR) 7.5 MG tablet Take 1 tablet (7.5 mg total) by mouth 3 (three) times daily. (Patient not taking: Reported on 04/07/2019) 60 tablet 2  . amoxicillin-clavulanate (AUGMENTIN) 875-125 MG tablet TAKE 1 TABLET BY MOUTH TWICE DAILY FOR 20 DAYS (Patient not taking: Reported on 06/08/2019) 40 tablet 0  . oxyCODONE-acetaminophen (PERCOCET) 10-325 MG tablet Take 1 tablet by mouth 3 (three) times daily as needed for pain.      No facility-administered medications prior to visit.    Allergies  Allergen Reactions  . Ivp Dye [Iodinated Diagnostic Agents]     Hives on hands and feet per pt 08/16/15 pt had scan w/ contrast and had a 4 hr premedication protocol done    ROS Review of Systems  Constitutional: Negative.   HENT: Negative.   Eyes: Negative.   Respiratory: Negative.   Cardiovascular: Negative.   Gastrointestinal: Positive for abdominal distention.  Endocrine: Negative.   Genitourinary: Negative.   Musculoskeletal: Positive for arthralgias (generalized joint pain).  Skin: Negative.    Objective:    Physical Exam  Constitutional: She is oriented to person, place, and time. She appears well-developed and well-nourished.  HENT:  Head: Normocephalic and atraumatic.  Eyes: Conjunctivae are normal.  Cardiovascular: Normal rate, regular rhythm, normal heart sounds and  intact distal pulses.  Pulmonary/Chest: Effort normal and breath sounds normal.  Abdominal: Soft. Bowel sounds are normal. She exhibits distension.  Musculoskeletal:        General: Normal range of motion.     Cervical back: Normal range of motion and neck supple.  Neurological: She is alert and oriented to person, place, and time. She has normal reflexes.  Skin: Skin is warm and dry.  Psychiatric: She has a normal mood and affect. Her behavior is normal. Judgment and thought content normal.  Nursing note and vitals reviewed.   BP (!) 144/77   Pulse 68   Temp 99 F (37.2 C)   Ht 5\' 8"  (1.727 m)   Wt 249 lb 3.2 oz (113 kg)   SpO2 97%  BMI 37.89 kg/m  Wt Readings from Last 3 Encounters:  06/08/19 249 lb 3.2 oz (113 kg)  04/07/19 245 lb (111.1 kg)  02/22/19 247 lb 3.2 oz (112.1 kg)     Health Maintenance Due  Topic Date Due  . COVID-19 Vaccine (1) Never done  . TETANUS/TDAP  Never done  . COLONOSCOPY  Never done  . PAP SMEAR-Modifier  10/04/2018  . MAMMOGRAM  05/13/2019    There are no preventive care reminders to display for this patient.  Lab Results  Component Value Date   TSH 0.700 04/24/2017   Lab Results  Component Value Date   WBC 8.8 07/07/2018   HGB 11.6 (L) 07/07/2018   HCT 34.1 (L) 07/07/2018   MCV 73.2 (L) 07/07/2018   PLT 303 07/07/2018   Lab Results  Component Value Date   NA 138 07/07/2018   K 3.0 (L) 07/07/2018   CO2 23 07/07/2018   GLUCOSE 142 (H) 07/07/2018   BUN 10 07/07/2018   CREATININE 1.03 (H) 07/07/2018   BILITOT 0.7 09/14/2017   ALKPHOS 52 09/14/2017   AST 24 09/14/2017   ALT 22 09/14/2017   PROT 7.5 09/14/2017   ALBUMIN 3.6 09/14/2017   CALCIUM 9.3 07/07/2018   ANIONGAP 12 07/07/2018   No results found for: CHOL No results found for: HDL No results found for: LDLCALC No results found for: TRIG No results found for: [facility] Lab Results  Component Value Date   HGBA1C 6.7 (A) 06/08/2019      Assessment & Plan:   1.  Prediabetes - POCT urinalysis dipstick - POCT glycosylated hemoglobin (Hb A1C) - POCT glucose (manual entry)  2. Hemoglobin A1c less than 7.0% Hgb A1c at 6.7 today. Monitor. - TSH  3. Hyperglycemia  4. Proteinuria, unspecified type - Microalbumin/Creatinine Ratio, Urine  5. Essential hypertension The current medical regimen is effective; blood pressure is stable at 144/77 today; continue present plan and medications as prescribed. She will continue to take medications as prescribed, to decrease high sodium intake, excessive alcohol intake, increase potassium intake, smoking cessation, and increase physical activity of at least 30 minutes of cardio activity daily. She will continue to follow Heart Healthy or DASH diet. - CBC with Differential - Comprehensive metabolic panel - Vitamin 123456 - Vitamin D, 25-hydroxy  6. Sarcoidosis Stable.   7. Neck pain, chronic - US THYROID; Future - US Soft Tissue Head/Neck (NON-THYROID); Future  8. Anxiety Stable.   9. Hidradenitis suppurativa We will initiate Doxycycline today.  - doxycycline (VIBRA-TABS) 100 MG tablet; Take 1 tablet (100 mg total) by mouth 2 (two) times daily.  Dispense: 20 tablet; Refill: 0  10. Follow up She will follow up in 6 months.   Meds ordered this encounter  Medications  . doxycycline (VIBRA-TABS) 100 MG tablet    Sig: Take 1 tablet (100 mg total) by mouth 2 (two) times daily.    Dispense:  20 tablet    Refill:  0    Orders Placed This Encounter  Procedures  . US THYROID  . US Soft Tissue Head/Neck (NON-THYROID)  . Microalbumin/Creatinine Ratio, Urine  . CBC with Differential  . Comprehensive metabolic panel  . TSH  . Vitamin B12  . Vitamin D, 25-hydroxy  . POCT urinalysis dipstick  . POCT glycosylated hemoglobin (Hb A1C)  . POCT glucose (manual entry)    Referral Orders  No referral(s) requested today    Kathe Becton,  MSN, FNP-BC [facility] [address] [place], [place] [address] [phone] fax   Problem List Items Addressed This Visit      Cardiovascular and Mediastinum   Essential hypertension   Relevant Orders   CBC with Differential   Comprehensive metabolic panel   Vitamin 123456   Vitamin D, 25-hydroxy     Musculoskeletal and Integument   Hidradenitis suppurativa   Relevant Medications   doxycycline (VIBRA-TABS) 100 MG tablet     Other   Anxiety   Hemoglobin A1c less than 7.0%   Relevant Orders   TSH   Hyperglycemia   Prediabetes - Primary   Relevant Orders   POCT urinalysis dipstick (Completed)   POCT glycosylated hemoglobin (Hb A1C) (Completed)   POCT glucose (manual entry) (Completed)   Sarcoidosis    Other Visit Diagnoses    Proteinuria, unspecified type       Relevant Orders   Microalbumin/Creatinine Ratio, Urine   Neck pain, chronic       Relevant Orders   US THYROID   US Soft Tissue Head/Neck (NON-THYROID)   Follow up          Meds ordered this encounter  Medications  . doxycycline (VIBRA-TABS) 100 MG tablet    Sig: Take 1 tablet (100 mg total) by mouth 2 (two) times daily.    Dispense:  20 tablet    Refill:  0    Follow-up: Return in about 6 months (around 12/08/2019).    Azzie Glatter, FNP

## 2019-06-09 ENCOUNTER — Telehealth: Payer: Self-pay

## 2019-06-09 ENCOUNTER — Encounter: Payer: Self-pay | Admitting: Family Medicine

## 2019-06-09 DIAGNOSIS — G894 Chronic pain syndrome: Secondary | ICD-10-CM | POA: Diagnosis not present

## 2019-06-09 DIAGNOSIS — Z79899 Other long term (current) drug therapy: Secondary | ICD-10-CM | POA: Diagnosis not present

## 2019-06-09 DIAGNOSIS — M542 Cervicalgia: Secondary | ICD-10-CM | POA: Insufficient documentation

## 2019-06-09 DIAGNOSIS — G8929 Other chronic pain: Secondary | ICD-10-CM | POA: Insufficient documentation

## 2019-06-09 DIAGNOSIS — M5136 Other intervertebral disc degeneration, lumbar region: Secondary | ICD-10-CM | POA: Diagnosis not present

## 2019-06-09 LAB — COMPREHENSIVE METABOLIC PANEL
ALT: 17 IU/L (ref 0–32)
AST: 18 IU/L (ref 0–40)
Albumin/Globulin Ratio: 1.2 (ref 1.2–2.2)
Albumin: 4 g/dL (ref 3.8–4.9)
Alkaline Phosphatase: 83 IU/L (ref 39–117)
BUN/Creatinine Ratio: 10 (ref 9–23)
BUN: 11 mg/dL (ref 6–24)
Bilirubin Total: 0.3 mg/dL (ref 0.0–1.2)
CO2: 24 mmol/L (ref 20–29)
Calcium: 9.4 mg/dL (ref 8.7–10.2)
Chloride: 101 mmol/L (ref 96–106)
Creatinine, Ser: 1.06 mg/dL — ABNORMAL HIGH (ref 0.57–1.00)
GFR calc Af Amer: 70 mL/min/{1.73_m2} (ref >59)
GFR calc non Af Amer: 61 mL/min/{1.73_m2} (ref >59)
Globulin, Total: 3.3 g/dL (ref 1.5–4.5)
Glucose: 122 mg/dL — ABNORMAL HIGH (ref 65–99)
Potassium: 3.6 mmol/L (ref 3.5–5.2)
Sodium: 139 mmol/L (ref 134–144)
Total Protein: 7.3 g/dL (ref 6.0–8.5)

## 2019-06-09 LAB — CBC WITH DIFFERENTIAL/PLATELET
Basophils Absolute: 0.1 10*3/uL (ref 0.0–0.2)
Basos: 1 %
EOS (ABSOLUTE): 0.3 10*3/uL (ref 0.0–0.4)
Eos: 3 %
Hematocrit: 36.8 % (ref 34.0–46.6)
Hemoglobin: 12.5 g/dL (ref 11.1–15.9)
Immature Grans (Abs): 0 10*3/uL (ref 0.0–0.1)
Immature Granulocytes: 0 %
Lymphocytes Absolute: 3.2 10*3/uL — ABNORMAL HIGH (ref 0.7–3.1)
Lymphs: 31 %
MCH: 25.4 pg — ABNORMAL LOW (ref 26.6–33.0)
MCHC: 34 g/dL (ref 31.5–35.7)
MCV: 75 fL — ABNORMAL LOW (ref 79–97)
Monocytes Absolute: 0.6 10*3/uL (ref 0.1–0.9)
Monocytes: 6 %
Neutrophils Absolute: 6.2 10*3/uL (ref 1.4–7.0)
Neutrophils: 59 %
Platelets: 284 10*3/uL (ref 150–450)
RBC: 4.92 x10E6/uL (ref 3.77–5.28)
RDW: 15.5 % — ABNORMAL HIGH (ref 11.7–15.4)
WBC: 10.3 10*3/uL (ref 3.4–10.8)

## 2019-06-09 LAB — MICROALBUMIN / CREATININE URINE RATIO
Creatinine, Urine: 159.7 mg/dL
Microalb/Creat Ratio: 10 mg/g creat (ref 0–29)
Microalbumin, Urine: 16.7 ug/mL

## 2019-06-09 LAB — TSH: TSH: 0.866 u[IU]/mL (ref 0.450–4.500)

## 2019-06-09 LAB — VITAMIN D 25 HYDROXY (VIT D DEFICIENCY, FRACTURES): Vit D, 25-Hydroxy: 14.4 ng/mL — ABNORMAL LOW (ref 30.0–100.0)

## 2019-06-09 LAB — VITAMIN B12: Vitamin B-12: 662 pg/mL (ref 232–1245)

## 2019-06-09 MED ORDER — DOXYCYCLINE HYCLATE 100 MG PO TABS: 100.0000 mg | ORAL_TABLET | Freq: Two times a day (BID) | ORAL | 0 refills | Status: DC

## 2019-06-09 NOTE — Telephone Encounter (Signed)
-----   Message from Azzie Glatter, [place] sent at 06/09/2019 12:34 AM EDT ----- Regarding: "New Rx" Please inform patient that New Rx for skin infection sent to pharmacy today.

## 2019-06-09 NOTE — Telephone Encounter (Signed)
Patient informed. 

## 2019-06-10 ENCOUNTER — Other Ambulatory Visit: Payer: Self-pay | Admitting: Family Medicine

## 2019-06-10 ENCOUNTER — Encounter: Payer: Self-pay | Admitting: Family Medicine

## 2019-06-10 DIAGNOSIS — E559 Vitamin D deficiency, unspecified: Secondary | ICD-10-CM

## 2019-06-10 MED ORDER — VITAMIN D (ERGOCALCIFEROL) 1.25 MG (50000 UNIT) PO CAPS: 50000.0000 [IU] | ORAL_CAPSULE | ORAL | 6 refills | Status: DC

## 2019-06-11 ENCOUNTER — Telehealth: Payer: Self-pay

## 2019-06-11 NOTE — Telephone Encounter (Signed)
done

## 2019-06-16 ENCOUNTER — Ambulatory Visit (HOSPITAL_COMMUNITY): Payer: Medicare Other

## 2019-06-16 ENCOUNTER — Other Ambulatory Visit: Payer: Self-pay | Admitting: Family Medicine

## 2019-06-16 DIAGNOSIS — R221 Localized swelling, mass and lump, neck: Secondary | ICD-10-CM

## 2019-06-16 DIAGNOSIS — M542 Cervicalgia: Secondary | ICD-10-CM

## 2019-06-16 DIAGNOSIS — G8929 Other chronic pain: Secondary | ICD-10-CM

## 2019-06-17 ENCOUNTER — Telehealth: Payer: Self-pay

## 2019-06-17 NOTE — Telephone Encounter (Signed)
Crystal Bullock has a Thyroid ultrasound scheduled on 06/24/2019.  Do wish to change the order? There are 3 orders  in her chart for the Thyroid.   Please clarify.

## 2019-06-18 ENCOUNTER — Telehealth: Payer: Self-pay

## 2019-06-18 NOTE — Telephone Encounter (Signed)
Done

## 2019-06-24 ENCOUNTER — Ambulatory Visit (HOSPITAL_COMMUNITY): Payer: Medicare Other

## 2019-06-29 ENCOUNTER — Ambulatory Visit (HOSPITAL_COMMUNITY): Payer: Medicare Other

## 2019-07-05 ENCOUNTER — Ambulatory Visit: Payer: Self-pay | Admitting: Family Medicine

## 2019-07-07 ENCOUNTER — Ambulatory Visit (HOSPITAL_COMMUNITY)
Admission: RE | Admit: 2019-07-07 | Discharge: 2019-07-07 | Disposition: A | Discharge status: Home / Self Care | Payer: Medicare Other | Source: Ambulatory Visit | Attending: Family Medicine | Admitting: Family Medicine

## 2019-07-07 ENCOUNTER — Other Ambulatory Visit: Payer: Self-pay

## 2019-07-07 DIAGNOSIS — R221 Localized swelling, mass and lump, neck: Secondary | ICD-10-CM

## 2019-07-07 DIAGNOSIS — G8929 Other chronic pain: Secondary | ICD-10-CM | POA: Diagnosis present

## 2019-07-07 DIAGNOSIS — E049 Nontoxic goiter, unspecified: Secondary | ICD-10-CM | POA: Diagnosis not present

## 2019-07-07 DIAGNOSIS — M542 Cervicalgia: Secondary | ICD-10-CM | POA: Diagnosis present

## 2019-07-09 ENCOUNTER — Other Ambulatory Visit: Payer: Self-pay | Admitting: Nurse Practitioner

## 2019-07-09 DIAGNOSIS — Z79899 Other long term (current) drug therapy: Secondary | ICD-10-CM | POA: Diagnosis not present

## 2019-07-09 DIAGNOSIS — D869 Sarcoidosis, unspecified: Secondary | ICD-10-CM

## 2019-07-09 DIAGNOSIS — G894 Chronic pain syndrome: Secondary | ICD-10-CM | POA: Diagnosis not present

## 2019-07-09 DIAGNOSIS — J4520 Mild intermittent asthma, uncomplicated: Secondary | ICD-10-CM

## 2019-07-09 DIAGNOSIS — M545 Low back pain: Secondary | ICD-10-CM | POA: Diagnosis not present

## 2019-07-13 ENCOUNTER — Ambulatory Visit (INDEPENDENT_AMBULATORY_CARE_PROVIDER_SITE_OTHER): Payer: Medicare Other | Admitting: Family Medicine

## 2019-07-13 ENCOUNTER — Other Ambulatory Visit: Payer: Self-pay

## 2019-07-13 ENCOUNTER — Encounter: Payer: Self-pay | Admitting: Family Medicine

## 2019-07-13 VITALS — BP 150/90 | HR 84 | Temp 98.3°F | Ht 68.0 in | Wt 247.2 lb

## 2019-07-13 DIAGNOSIS — R7309 Other abnormal glucose: Secondary | ICD-10-CM | POA: Diagnosis not present

## 2019-07-13 DIAGNOSIS — G5793 Unspecified mononeuropathy of bilateral lower limbs: Secondary | ICD-10-CM

## 2019-07-13 DIAGNOSIS — I10 Essential (primary) hypertension: Secondary | ICD-10-CM | POA: Diagnosis not present

## 2019-07-13 DIAGNOSIS — R7303 Prediabetes: Secondary | ICD-10-CM

## 2019-07-13 DIAGNOSIS — Z09 Encounter for follow-up examination after completed treatment for conditions other than malignant neoplasm: Secondary | ICD-10-CM

## 2019-07-13 DIAGNOSIS — F419 Anxiety disorder, unspecified: Secondary | ICD-10-CM

## 2019-07-13 DIAGNOSIS — Z789 Other specified health status: Secondary | ICD-10-CM

## 2019-07-13 MED ORDER — PREGABALIN 50 MG PO CAPS: 50.0000 mg | ORAL_CAPSULE | Freq: Two times a day (BID) | ORAL | 3 refills | Status: DC

## 2019-07-13 NOTE — Progress Notes (Signed)
Patient [facility]    Established Patient Office Visit  Subjective:  Patient ID: Crystal Bullock, female    DOB: 15-Apr-1965  Age: 54 y.o. MRN: [id number]  CC:  Chief Complaint  Patient presents with   Follow-up    HPI Crystal Bullock is a 54 year old female who presents for Follow Up today.   Past Medical History:  Diagnosis Date   Anxiety 10/06/2015   Asthma    Atypical chest pain 10/06/2015   Chronic neck pain    Fibroid    Hypertension    Palpitations 10/06/2015   RBBB 10/06/2015   Sarcoidosis    Scoliosis    Seasonal allergies    Sickle cell trait ([place])    Vitamin D deficiency 05/2019   Patient Active Problem List   Diagnosis Date Noted   Neck pain, chronic 06/09/2019   Tearfulness 02/22/2019   Prediabetes 02/22/2019   Hyperglycemia 02/22/2019   Hemoglobin A1c less than 7.0% 02/22/2019   Yeast infection 11/11/2018   GERD (gastroesophageal reflux disease) 08/19/2018   Mild tetrahydrocannabinol (THC) abuse 08/19/2018   Hidradenitis suppurativa 06/05/2018   Allergic rhinitis 05/08/2018   OSA (obstructive sleep apnea) 05/13/2017   Low back pain 02/24/2017   Sarcoidosis 10/06/2015   Essential hypertension 10/06/2015   Asthma 10/06/2015   Atypical chest pain 10/06/2015   Anxiety 10/06/2015   Palpitations 10/06/2015   RBBB 10/06/2015     Current Status: Since her last office visit, she continues to have c/o peripheral neuropathy. She states that Gabapentin because of undesired side effects. She is taking all other medications as prescribed. She continues to work towards her weight loss goal. She denies fatigue, frequent urination, blurred vision, excessive hunger, excessive thirst, weight gain, weight loss, and poor wound healing. She continues to check her feet regularly. She denies visual changes, chest pain, cough, shortness of breath, heart palpitations, and falls. She has  occasional headaches and dizziness with position changes. Denies severe headaches, confusion, seizures, double vision, and blurred vision, nausea and vomiting. She denies fevers, chills, recent infections, weight loss, and night sweats. Denies GI problems such as diarrhea, and constipation. She has no reports of blood in stools, dysuria and hematuria. Her anxiety is moderate today. She denies suicidal ideations, homicidal ideations, or auditory hallucinations.     Past Surgical History:  Procedure Laterality Date   AXILLARY LYMPH NODE DISSECTION     CHOLECYSTECTOMY      Family History  Problem Relation Age of Onset   Hypertension Mother    Diabetes Mother     Social History   Socioeconomic History   Marital status: Single    Spouse name: Not on file   Number of children: Not on file   Years of education: Not on file   Highest education level: Not on file  Occupational History   Not on file  Tobacco Use   Smoking status: Never Smoker   Smokeless tobacco: Never Used  Substance and Sexual Activity   Alcohol use: Yes    Comment: occasional    Drug use: Yes    Types: Marijuana    Comment: once a week   Sexual activity: Yes    Birth control/protection: Condom  Other Topics Concern   Not on file  Social History Narrative   Not on file   Social Determinants of Health   Financial Resource Strain:    Difficulty of Paying Living Expenses:   Food Insecurity:  Worried About Charity fundraiser in the Last Year:    Arboriculturist in the Last Year:   Transportation Needs:    Film/video editor (Medical):    Lack of Transportation (Non-Medical):   Physical Activity:    Days of Exercise per Week:    Minutes of Exercise per Session:   Stress:    Feeling of Stress :   Social Connections:    Frequency of Communication with Friends and Family:    Frequency of Social Gatherings with Friends and Family:    Attends Religious Services:    Active  Member of Clubs or Organizations:    Attends Music therapist:    Marital Status:   Intimate Partner Violence:    Fear of Current or Ex-Partner:    Emotionally Abused:    Physically Abused:    Sexually Abused:     Outpatient Medications Prior to Visit  Medication Sig Dispense Refill   albuterol (PROVENTIL) (2.5 MG/3ML) 0.083% nebulizer solution Take 3 mLs (2.5 mg total) by nebulization every 6 (six) hours as needed for wheezing or shortness of breath. 75 mL 12   albuterol (VENTOLIN HFA) 108 (90 Base) MCG/ACT inhaler INHALE 2 PUFFS INTO THE LUNGS EVERY 6 HOURS FOR UP TO 90 DOSES AS NEEDED FOR WHEEZING 6.7 g 0   amLODipine (NORVASC) 10 MG tablet Take 1 tablet (10 mg total) by mouth daily. 90 tablet 1   busPIRone (BUSPAR) 7.5 MG tablet Take 1 tablet (7.5 mg total) by mouth 3 (three) times daily. 60 tablet 2   cetirizine (ZYRTEC) 10 MG tablet Take 1 tablet (10 mg total) by mouth daily as needed for allergies. 30 tablet 2   esomeprazole (NEXIUM) 40 MG capsule TAKE 1 CAPSULE(40 MG) BY MOUTH DAILY 30 capsule 3   oxyCODONE-acetaminophen (PERCOCET) 10-325 MG tablet Take 1 tablet by mouth every 8 (eight) hours as needed for pain.     hydrOXYzine (VISTARIL) 50 MG capsule Take as prescribed as AVS. 120 capsule 1   Multiple Vitamins-Minerals (MULTIVITAMINS THER. W/MINERALS) TABS Take 1 tablet by mouth daily.     nystatin ointment (MYCOSTATIN) Apply 1 application topically 2 (two) times daily. 30 g 0   ondansetron (ZOFRAN ODT) 4 MG disintegrating tablet Take 1 tablet (4 mg total) by mouth every 8 (eight) hours as needed for nausea or vomiting. 20 tablet 0   Vitamin D, Ergocalciferol, (DRISDOL) 1.25 MG (50000 UNIT) CAPS capsule Take 1 capsule (50,000 Units total) by mouth every 7 (seven) days. 5 capsule 6   doxycycline (VIBRA-TABS) 100 MG tablet Take 1 tablet (100 mg total) by mouth 2 (two) times daily. 20 tablet 0   gabapentin (NEURONTIN) 300 MG capsule Take 300 mg by  mouth 3 (three) times daily.     No facility-administered medications prior to visit.    Allergies  Allergen Reactions   Ivp Dye [Iodinated Diagnostic Agents]     Hives on hands and feet per pt 08/16/15 pt had scan w/ contrast and had a 4 hr premedication protocol done    ROS Review of Systems  Constitutional: Negative.   HENT: Negative.   Eyes: Negative.   Respiratory: Negative.   Cardiovascular: Negative.   Gastrointestinal: Positive for abdominal distention.  Endocrine: Negative.   Genitourinary: Negative.   Musculoskeletal: Positive for arthralgias (generalized pain) and back pain (chronic with sciatica).  Skin: Negative.   Allergic/Immunologic: Negative.   Neurological: Positive for dizziness (occasional ) and headaches (occasional ).  Hematological: Negative.  Psychiatric/Behavioral: Negative.       Objective:    Physical Exam  Constitutional: She is oriented to person, place, and time. She appears well-developed and well-nourished.  HENT:  Head: Normocephalic and atraumatic.  Eyes: Conjunctivae are normal.  Cardiovascular: Normal rate, regular rhythm, normal heart sounds and intact distal pulses.  Pulmonary/Chest: Effort normal and breath sounds normal.  Abdominal: Soft. Bowel sounds are normal. She exhibits distension (obese).  Musculoskeletal:        General: Normal range of motion.     Cervical back: Normal range of motion and neck supple.  Neurological: She is alert and oriented to person, place, and time. She has normal reflexes.  Skin: Skin is warm and dry.  Psychiatric: She has a normal mood and affect. Her behavior is normal. Judgment and thought content normal.  Nursing note and vitals reviewed.   BP (!) 150/90    Pulse 84    Temp 98.3 F (36.8 C)    Ht 5\' 8"  (1.727 m)    Wt 247 lb 3.2 oz (112.1 kg)    SpO2 99%    BMI 37.59 kg/m  Wt Readings from Last 3 Encounters:  07/13/19 247 lb 3.2 oz (112.1 kg)  06/08/19 249 lb 3.2 oz (113 kg)  04/07/19 245  lb (111.1 kg)     Health Maintenance Due  Topic Date Due   COVID-19 Vaccine (1) Never done   TETANUS/TDAP  Never done   COLONOSCOPY  Never done   PAP SMEAR-Modifier  10/04/2018   MAMMOGRAM  05/13/2019    There are no preventive care reminders to display for this patient.  Lab Results  Component Value Date   TSH 0.866 06/08/2019   Lab Results  Component Value Date   WBC 10.3 06/08/2019   HGB 12.5 06/08/2019   HCT 36.8 06/08/2019   MCV 75 (L) 06/08/2019   PLT 284 06/08/2019   Lab Results  Component Value Date   NA 139 06/08/2019   K 3.6 06/08/2019   CO2 24 06/08/2019   GLUCOSE 122 (H) 06/08/2019   BUN 11 06/08/2019   CREATININE 1.06 (H) 06/08/2019   BILITOT 0.3 06/08/2019   ALKPHOS 83 06/08/2019   AST 18 06/08/2019   ALT 17 06/08/2019   PROT 7.3 06/08/2019   ALBUMIN 4.0 06/08/2019   CALCIUM 9.4 06/08/2019   ANIONGAP 12 07/07/2018   No results found for: CHOL No results found for: HDL No results found for: LDLCALC No results found for: TRIG No results found for: [facility] Lab Results  Component Value Date   HGBA1C 6.7 (A) 06/08/2019     Assessment & Plan:   1. Neuropathy involving both lower extremities We will discontinue Gabapentin today. We will initiate Lyrica today.  - pregabalin (LYRICA) 50 MG capsule; Take 1 capsule (50 mg total) by mouth 2 (two) times daily.  Dispense: 60 capsule; Refill: 3  2. Hemoglobin A1c less than 7.0% Hgb A1c is stable at 6.7 today. Monitor.   3. Prediabetes  4. Essential hypertension The current medical regimen is effective; blood pressure is elevated today, because she missed dose this morning; continue present plan and medications as prescribed. She will continue to take medications as prescribed, to decrease high sodium intake, excessive alcohol intake, increase potassium intake, smoking cessation, and increase physical activity of at least 30 minutes of cardio activity daily. She will continue to follow Heart  Healthy or DASH diet.  5. Anxiety Stable today.   6. Under care of pain management specialist  7. Follow up She will follow up in 3 months.  - POCT urinalysis dipstick  Meds ordered this encounter  Medications   pregabalin (LYRICA) 50 MG capsule    Sig: Take 1 capsule (50 mg total) by mouth 2 (two) times daily.    Dispense:  60 capsule    Refill:  3    Orders Placed This Encounter  Procedures   POCT urinalysis dipstick    Referral Orders  No referral(s) requested today    Kathe Becton,  MSN, FNP-BC [address], [place] [address] [phone]- fax  Problem List Items Addressed This Visit      Cardiovascular and Mediastinum   Essential hypertension     Other   Anxiety   Hemoglobin A1c less than 7.0%   Prediabetes    Other Visit Diagnoses    Follow up    -  Primary   Relevant Orders   POCT urinalysis dipstick   Neuropathy involving both lower extremities       Relevant Medications   pregabalin (LYRICA) 50 MG capsule   Under care of pain management specialist          Meds ordered this encounter  Medications   pregabalin (LYRICA) 50 MG capsule    Sig: Take 1 capsule (50 mg total) by mouth 2 (two) times daily.    Dispense:  60 capsule    Refill:  3    Follow-up: Return in about 3 months (around 10/13/2019).    Azzie Glatter, FNP

## 2019-07-13 NOTE — Patient Instructions (Signed)
Peripheral Neuropathy Peripheral neuropathy is a type of nerve damage. It affects nerves that carry signals between the spinal cord and the arms, legs, and the rest of the body (peripheral nerves). It does not affect nerves in the spinal cord or brain. In peripheral neuropathy, one nerve or a group of nerves may be damaged. Peripheral neuropathy is a broad category that includes many specific nerve disorders, like diabetic neuropathy, hereditary neuropathy, and carpal tunnel syndrome. What are the causes? This condition may be caused by:  Diabetes. This is the most common cause of peripheral neuropathy.  Nerve injury.  Pressure or stress on a nerve that lasts a long time.  Lack (deficiency) of B vitamins. This can result from alcoholism, poor diet, or a restricted diet.  Infections.  Autoimmune diseases, such as rheumatoid arthritis and systemic lupus erythematosus.  Nerve diseases that are passed from parent to child (inherited).  Some medicines, such as cancer medicines (chemotherapy).  Poisonous (toxic) substances, such as lead and mercury.  Too little blood flowing to the legs.  Kidney disease.  Thyroid disease. In some cases, the cause of this condition is not known. What are the signs or symptoms? Symptoms of this condition depend on which of your nerves is damaged. Common symptoms include:  Loss of feeling (numbness) in the feet, hands, or both.  Tingling in the feet, hands, or both.  Burning pain.  Very sensitive skin.  Weakness.  Not being able to move a part of the body (paralysis).  Muscle twitching.  Clumsiness or poor coordination.  Loss of balance.  Not being able to control your bladder.  Feeling dizzy.  Sexual problems. How is this diagnosed? Diagnosing and finding the cause of peripheral neuropathy can be difficult. Your health care provider will take your medical history and do a physical exam. A neurological exam will also be done. This  involves checking things that are affected by your brain, spinal cord, and nerves (nervous system). For example, your health care provider will check your reflexes, how you move, and what you can feel. You may have other tests, such as:  Blood tests.  Electromyogram (EMG) and nerve conduction tests. These tests check nerve function and how well the nerves are controlling the muscles.  Imaging tests, such as CT scans or MRI to rule out other causes of your symptoms.  Removing a small piece of nerve to be examined in a lab (nerve biopsy). This is rare.  Removing and examining a small amount of the fluid that surrounds the brain and spinal cord (lumbar puncture). This is rare. How is this treated? Treatment for this condition may involve:  Treating the underlying cause of the neuropathy, such as diabetes, kidney disease, or vitamin deficiencies.  Stopping medicines that can cause neuropathy, such as chemotherapy.  Medicine to relieve pain. Medicines may include: ? Prescription or over-the-counter pain medicine. ? Antiseizure medicine. ? Antidepressants. ? Pain-relieving patches that are applied to painful areas of skin.  Surgery to relieve pressure on a nerve or to destroy a nerve that is causing pain.  Physical therapy to help improve movement and balance.  Devices to help you move around (assistive devices). Follow these instructions at home: Medicines  Take over-the-counter and prescription medicines only as told by your health care provider. Do not take any other medicines without first asking your health care provider.  Do not drive or use heavy machinery while taking prescription pain medicine. Lifestyle   Do not use any products that contain nicotine   or tobacco, such as cigarettes and e-cigarettes. Smoking keeps blood from reaching damaged nerves. If you need help quitting, ask your health care provider.  Avoid or limit alcohol. Too much alcohol can cause a vitamin B  deficiency, and vitamin B is needed for healthy nerves.  Eat a healthy diet. This includes: ? Eating foods that are high in fiber, such as fresh fruits and vegetables, whole grains, and beans. ? Limiting foods that are high in fat and processed sugars, such as fried or sweet foods. General instructions   If you have diabetes, work closely with your health care provider to keep your blood sugar under control.  If you have numbness in your feet: ? Check every day for signs of injury or infection. Watch for redness, warmth, and swelling. ? Wear padded socks and comfortable shoes. These help protect your feet.  Develop a good support system. Living with peripheral neuropathy can be stressful. Consider talking with a mental health specialist or joining a support group.  Use assistive devices and attend physical therapy as told by your health care provider. This may include using a walker or a cane.  Keep all follow-up visits as told by your health care provider. This is important. Contact a health care provider if:  You have new signs or symptoms of peripheral neuropathy.  You are struggling emotionally from dealing with peripheral neuropathy.  Your pain is not well-controlled. Get help right away if:  You have an injury or infection that is not healing normally.  You develop new weakness in an arm or leg.  You fall frequently. Summary  Peripheral neuropathy is when the nerves in the arms, or legs are damaged, resulting in numbness, weakness, or pain.  There are many causes of peripheral neuropathy, including diabetes, pinched nerves, vitamin deficiencies, autoimmune disease, and hereditary conditions.  Diagnosing and finding the cause of peripheral neuropathy can be difficult. Your health care provider will take your medical history, do a physical exam, and do tests, including blood tests and nerve function tests.  Treatment involves treating the underlying cause of the  neuropathy and taking medicines to help control pain. Physical therapy and assistive devices may also help. This information is not intended to replace advice given to you by your health care provider. Make sure you discuss any questions you have with your health care provider. Document Revised: 01/10/2017 Document Reviewed: 04/08/2016 Elsevier Patient Education  2020 Elsevier Inc.    Pregabalin capsules What is this medicine? PREGABALIN (pre GAB a lin) is used to treat nerve pain from diabetes, shingles, spinal cord injury, and fibromyalgia. It is also used to control seizures in epilepsy. This medicine may be used for other purposes; ask your health care provider or pharmacist if you have questions. COMMON BRAND NAME(S): Lyrica What should I tell my health care provider before I take this medicine? They need to know if you have any of these conditions:  heart disease  history of drug abuse or alcohol abuse problem  kidney disease  lung or breathing disease  suicidal thoughts, plans, or attempt; a previous suicide attempt by you or a family member  an unusual or allergic reaction to pregabalin, gabapentin, other medicines, foods, dyes, or preservatives  pregnant or trying to get pregnant  breast-feeding How should I use this medicine? Take this medicine by mouth with a glass of water. Follow the directions on the prescription label. You can take it with or without food. If it upsets your stomach, take it with   food. Take your medicine at regular intervals. Do not take it more often than directed. Do not stop taking except on your doctor's advice. A special MedGuide will be given to you by the pharmacist with each prescription and refill. Be sure to read this information carefully each time. Talk to your pediatrician regarding the use of this medicine in children. While this drug may be prescribed for children as young as 1 month for selected conditions, precautions do  apply. Overdosage: If you think you have taken too much of this medicine contact a poison control center or emergency room at once. NOTE: This medicine is only for you. Do not share this medicine with others. What if I miss a dose? If you miss a dose, take it as soon as you can. If it is almost time for your next dose, take only that dose. Do not take double or extra doses. What may interact with this medicine? This medicine may interact with the following medications:  alcohol  antihistamines for allergy, cough, and cold  certain medicines for anxiety or sleep  certain medicines for depression like amitriptyline, fluoxetine, sertraline  certain medicines for diabetes  certain medicines for seizures like phenobarbital, primidone  general anesthetics like halothane, isoflurane, methoxyflurane, propofol  local anesthetics like lidocaine, pramoxine, tetracaine  medicines that relax muscles for surgery  narcotic medicines for pain  phenothiazines like chlorpromazine, mesoridazine, prochlorperazine, thioridazine This list may not describe all possible interactions. Give your health care provider a list of all the medicines, herbs, non-prescription drugs, or dietary supplements you use. Also tell them if you smoke, drink alcohol, or use illegal drugs. Some items may interact with your medicine. What should I watch for while using this medicine? Tell your doctor or healthcare professional if your symptoms do not start to get better or if they get worse. Visit your doctor or health care professional for regular checks on your progress. Do not stop taking except on your doctor's advice. You may develop a severe reaction. Your doctor will tell you how much medicine to take. Wear a medical identification bracelet or chain if you are taking this medicine for seizures, and carry a card that describes your disease and details of your medicine and dosage times. You may get drowsy or dizzy. Do not  drive, use machinery, or do anything that needs mental alertness until you know how this medicine affects you. Do not stand or sit up quickly, especially if you are an older patient. This reduces the risk of dizzy or fainting spells. Alcohol may interfere with the effect of this medicine. Avoid alcoholic drinks. If you have a heart condition, like congestive heart failure, and notice that you are retaining water and have swelling in your hands or feet, contact your health care provider immediately. The use of this medicine may increase the chance of suicidal thoughts or actions. Pay special attention to how you are responding while on this medicine. Any worsening of mood, or thoughts of suicide or dying should be reported to your health care professional right away. This medicine has caused reduced sperm counts in some men. This may interfere with the ability to father a child. You should talk to your doctor or health care professional if you are concerned about your fertility. Women who become pregnant while using this medicine for seizures may enroll in the [place] Antiepileptic Drug Pregnancy Registry by calling 1-888-233-2334. This registry collects information about the safety of antiepileptic drug use during pregnancy. What side effects   may I notice from receiving this medicine? Side effects that you should report to your doctor or health care professional as soon as possible:  allergic reactions like skin rash, itching or hives, swelling of the face, lips, or tongue  breathing problems  changes in vision  chest pain  confusion  jerking or unusual movements of any part of your body  loss of memory  muscle pain, tenderness, or weakness  suicidal thoughts or other mood changes  swelling of the ankles, feet, hands  unusual bruising or bleeding Side effects that usually do not require medical attention (report to your doctor or health care professional if they continue or are  bothersome):  dizziness  drowsiness  dry mouth  headache  nausea  tremors  trouble sleeping  weight gain This list may not describe all possible side effects. Call your doctor for medical advice about side effects. You may report side effects to FDA at 1-800-FDA-1088. Where should I keep my medicine? Keep out of the reach of children. This medicine can be abused. Keep your medicine in a safe place to protect it from theft. Do not share this medicine with anyone. Selling or giving away this medicine is dangerous and against the law. This medicine may cause accidental overdose and death if it taken by other adults, children, or pets. Mix any unused medicine with a substance like cat litter or coffee grounds. Then throw the medicine away in a sealed container like a sealed bag or a coffee can with a lid. Do not use the medicine after the expiration date. Store at room temperature between 15 and 30 degrees C (59 and 86 degrees F). NOTE: This sheet is a summary. It may not cover all possible information. If you have questions about this medicine, talk to your doctor, pharmacist, or health care provider.  2020 Elsevier/Gold Standard (2018-01-30 13:15:55)  

## 2019-07-16 ENCOUNTER — Other Ambulatory Visit: Payer: Self-pay | Admitting: Family Medicine

## 2019-07-16 DIAGNOSIS — B379 Candidiasis, unspecified: Secondary | ICD-10-CM

## 2019-07-16 MED ORDER — FLUCONAZOLE 150 MG PO TABS: 150.0000 mg | ORAL_TABLET | Freq: Once | ORAL | 0 refills | Status: DC

## 2019-08-10 DIAGNOSIS — Z1159 Encounter for screening for other viral diseases: Secondary | ICD-10-CM | POA: Diagnosis not present

## 2019-08-10 DIAGNOSIS — E559 Vitamin D deficiency, unspecified: Secondary | ICD-10-CM | POA: Diagnosis not present

## 2019-08-10 DIAGNOSIS — Z79899 Other long term (current) drug therapy: Secondary | ICD-10-CM | POA: Diagnosis not present

## 2019-08-10 DIAGNOSIS — G894 Chronic pain syndrome: Secondary | ICD-10-CM | POA: Diagnosis not present

## 2019-08-10 DIAGNOSIS — M545 Low back pain: Secondary | ICD-10-CM | POA: Diagnosis not present

## 2019-08-31 ENCOUNTER — Ambulatory Visit: Payer: Self-pay | Admitting: Family Medicine

## 2019-08-31 ENCOUNTER — Other Ambulatory Visit: Payer: Self-pay | Admitting: Family Medicine

## 2019-08-31 DIAGNOSIS — L732 Hidradenitis suppurativa: Secondary | ICD-10-CM

## 2019-08-31 MED ORDER — AMOXICILLIN-POT CLAVULANATE 875-125 MG PO TABS: 1.0000 | ORAL_TABLET | Freq: Two times a day (BID) | ORAL | 0 refills | Status: DC

## 2019-09-07 DIAGNOSIS — Z79899 Other long term (current) drug therapy: Secondary | ICD-10-CM | POA: Diagnosis not present

## 2019-09-07 DIAGNOSIS — M5136 Other intervertebral disc degeneration, lumbar region: Secondary | ICD-10-CM | POA: Diagnosis not present

## 2019-09-07 DIAGNOSIS — G894 Chronic pain syndrome: Secondary | ICD-10-CM | POA: Diagnosis not present

## 2019-09-10 ENCOUNTER — Other Ambulatory Visit: Payer: Self-pay

## 2019-09-10 ENCOUNTER — Emergency Department (HOSPITAL_COMMUNITY): Payer: Medicare Other

## 2019-09-10 ENCOUNTER — Emergency Department (HOSPITAL_COMMUNITY)
Admission: EM | Admit: 2019-09-10 | Discharge: 2019-09-10 | Disposition: A | Discharge status: Home / Self Care | Payer: Medicare Other | Attending: Emergency Medicine | Admitting: Emergency Medicine

## 2019-09-10 ENCOUNTER — Encounter (HOSPITAL_COMMUNITY): Payer: Self-pay

## 2019-09-10 DIAGNOSIS — J45909 Unspecified asthma, uncomplicated: Secondary | ICD-10-CM | POA: Insufficient documentation

## 2019-09-10 DIAGNOSIS — K429 Umbilical hernia without obstruction or gangrene: Secondary | ICD-10-CM | POA: Diagnosis not present

## 2019-09-10 DIAGNOSIS — N281 Cyst of kidney, acquired: Secondary | ICD-10-CM

## 2019-09-10 DIAGNOSIS — Z79899 Other long term (current) drug therapy: Secondary | ICD-10-CM | POA: Diagnosis not present

## 2019-09-10 DIAGNOSIS — Z7951 Long term (current) use of inhaled steroids: Secondary | ICD-10-CM | POA: Insufficient documentation

## 2019-09-10 DIAGNOSIS — I700 Atherosclerosis of aorta: Secondary | ICD-10-CM | POA: Diagnosis not present

## 2019-09-10 DIAGNOSIS — I10 Essential (primary) hypertension: Secondary | ICD-10-CM | POA: Diagnosis not present

## 2019-09-10 DIAGNOSIS — M5489 Other dorsalgia: Secondary | ICD-10-CM | POA: Diagnosis not present

## 2019-09-10 DIAGNOSIS — M545 Low back pain, unspecified: Secondary | ICD-10-CM

## 2019-09-10 DIAGNOSIS — K5730 Diverticulosis of large intestine without perforation or abscess without bleeding: Secondary | ICD-10-CM | POA: Diagnosis not present

## 2019-09-10 DIAGNOSIS — G8929 Other chronic pain: Secondary | ICD-10-CM | POA: Insufficient documentation

## 2019-09-10 LAB — URINALYSIS, ROUTINE W REFLEX MICROSCOPIC
Bilirubin Urine: NEGATIVE
Glucose, UA: NEGATIVE mg/dL
Hgb urine dipstick: NEGATIVE
Ketones, ur: NEGATIVE mg/dL
Leukocytes,Ua: NEGATIVE
Nitrite: NEGATIVE
Protein, ur: NEGATIVE mg/dL
Specific Gravity, Urine: 1.009 (ref 1.005–1.030)
pH: 6 (ref 5.0–8.0)

## 2019-09-10 LAB — CBC WITH DIFFERENTIAL/PLATELET
Abs Immature Granulocytes: 0.06 10*3/uL (ref 0.00–0.07)
Basophils Absolute: 0.1 10*3/uL (ref 0.0–0.1)
Basophils Relative: 1 %
Eosinophils Absolute: 0.3 10*3/uL (ref 0.0–0.5)
Eosinophils Relative: 3 %
HCT: 34.7 % — ABNORMAL LOW (ref 36.0–46.0)
Hemoglobin: 11.8 g/dL — ABNORMAL LOW (ref 12.0–15.0)
Immature Granulocytes: 1 %
Lymphocytes Relative: 35 %
Lymphs Abs: 3.9 10*3/uL (ref 0.7–4.0)
MCH: 25.4 pg — ABNORMAL LOW (ref 26.0–34.0)
MCHC: 34 g/dL (ref 30.0–36.0)
MCV: 74.6 fL — ABNORMAL LOW (ref 80.0–100.0)
Monocytes Absolute: 0.7 10*3/uL (ref 0.1–1.0)
Monocytes Relative: 6 %
Neutro Abs: 6.2 10*3/uL (ref 1.7–7.7)
Neutrophils Relative %: 54 %
Platelets: 288 10*3/uL (ref 150–400)
RBC: 4.65 MIL/uL (ref 3.87–5.11)
RDW: 15.1 % (ref 11.5–15.5)
WBC: 11.2 10*3/uL — ABNORMAL HIGH (ref 4.0–10.5)
nRBC: 0 % (ref 0.0–0.2)

## 2019-09-10 LAB — COMPREHENSIVE METABOLIC PANEL
ALT: 18 U/L (ref 0–44)
AST: 19 U/L (ref 15–41)
Albumin: 3.5 g/dL (ref 3.5–5.0)
Alkaline Phosphatase: 55 U/L (ref 38–126)
Anion gap: 10 (ref 5–15)
BUN: 18 mg/dL (ref 6–20)
CO2: 27 mmol/L (ref 22–32)
Calcium: 8.9 mg/dL (ref 8.9–10.3)
Chloride: 104 mmol/L (ref 98–111)
Creatinine, Ser: 1.02 mg/dL — ABNORMAL HIGH (ref 0.44–1.00)
GFR calc Af Amer: >60 mL/min (ref >60)
GFR calc non Af Amer: >60 mL/min (ref >60)
Glucose, Bld: 104 mg/dL — ABNORMAL HIGH (ref 70–99)
Potassium: 3.5 mmol/L (ref 3.5–5.1)
Sodium: 141 mmol/L (ref 135–145)
Total Bilirubin: 0.2 mg/dL — ABNORMAL LOW (ref 0.3–1.2)
Total Protein: 7.2 g/dL (ref 6.5–8.1)

## 2019-09-10 LAB — I-STAT BETA HCG BLOOD, ED (MC, WL, AP ONLY): I-stat hCG, quantitative: <5 m[IU]/mL (ref <5)

## 2019-09-10 MED ORDER — FENTANYL CITRATE (PF) 100 MCG/2ML IJ SOLN
50.0000 ug | Freq: Once | INTRAMUSCULAR | Status: AC
  Administered 2019-09-10: 50 ug via INTRAVENOUS
  Filled 2019-09-10: qty 2

## 2019-09-10 MED ORDER — HYDROMORPHONE HCL 1 MG/ML IJ SOLN
0.5000 mg | Freq: Once | INTRAMUSCULAR | Status: AC
  Administered 2019-09-10: 0.5 mg via INTRAVENOUS
  Filled 2019-09-10: qty 1

## 2019-09-10 MED ORDER — PREDNISONE 20 MG PO TABS
40.0000 mg | ORAL_TABLET | Freq: Every day | ORAL | 0 refills | Status: DC
Start: 2019-09-10 — End: 2019-09-13

## 2019-09-10 MED ORDER — ONDANSETRON HCL 4 MG/2ML IJ SOLN
4.0000 mg | Freq: Once | INTRAMUSCULAR | Status: AC
  Administered 2019-09-10: 4 mg via INTRAVENOUS
  Filled 2019-09-10: qty 2

## 2019-09-10 MED ORDER — MORPHINE SULFATE (PF) 4 MG/ML IV SOLN
4.0000 mg | Freq: Once | INTRAVENOUS | Status: AC
  Administered 2019-09-10: 4 mg via INTRAVENOUS
  Filled 2019-09-10: qty 1

## 2019-09-10 NOTE — ED Notes (Signed)
Pt is able to stand and ambulate without any assistance.

## 2019-09-10 NOTE — ED Notes (Signed)
Pt left before signing for AVS.

## 2019-09-10 NOTE — ED Notes (Signed)
Pt was able to ambulate without any assistance out of the building. Wheelchair offered, but pt felt comfortable without.

## 2019-09-10 NOTE — Discharge Instructions (Addendum)
Continue taking your pain medication as well as Robaxin to help with your pain.  As we discussed, today your CT scan did not show any signs of infection.  There is mention of kidney cysts.  These look benign.  There is nothing to do about them today.  This is most likely not causing your pain.  You can follow-up with your primary care doctor.  I have added on a few days of prednisone to up with acute pain.  Follow-up with your primary care doctor in the next 2 to 3 days for further evaluation.  Return to the emergency department for any worsening pain, nausea/vomiting, worsening back pain, numbness/weakness of your arms or legs, difficulty walking or any other worsening concerning symptoms.

## 2019-09-10 NOTE — ED Triage Notes (Addendum)
Per EMS- Patient reports a chronic right lower back pain and saw pain management 3 days ago. Patient was prescribed Percocet and received a steroid injection.  Patient states the pain returned last night after lifting a 5 lb jug of water.

## 2019-09-10 NOTE — ED Provider Notes (Signed)
[facility] DEPT Provider Note   CSN: [id number] Arrival date & time: 09/10/19  1453     History Chief Complaint  Patient presents with  . Back Pain    Crystal Bullock is a 54 y.o. female past medical history of chronic neck pain, hypertension, sickle cell trait, vitamin D deficiency who presents via EMS for evaluation of low back pain, abdominal pain that has been ongoing for about 4 to 5 days.  She states that pain initially started about 4 5 days ago.  She thought it was her chronic lower back pain and so she went to her doctor.  She had steroid injections as well as given a prescription for Percocet.  She states that she had some temporary improvement after the steroid injections but states that the pain has since returned.  She describes it as "a contraction type pain" in her lower back.  She states it does radiate around to the front of her abdomen.  She states that the pain also goes down posterior aspect of her right lower extremity.  No preceding trauma, injury, fall. She does endorse recently lifting a 5lb water filter replacement. She has been able to ambulate.  She reports taking Percocet at home with no improvement in her symptoms.  She denies any fever, chest pain, difficulty breathing, nausea/vomiting. Denies fevers, weight loss, numbness/weakness of upper and lower extremities, bowel/bladder incontinence, saddle anesthesia, history of back surgery, history of IVDA.   The history is provided by the patient.       Past Medical History:  Diagnosis Date  . Anxiety 10/06/2015  . Asthma   . Atypical chest pain 10/06/2015  . Chronic neck pain   . Fibroid   . Hypertension   . Palpitations 10/06/2015  . RBBB 10/06/2015  . Sarcoidosis   . Scoliosis   . Seasonal allergies   . Sickle cell trait ([place])   . Vitamin D deficiency 05/2019    Patient Active Problem List   Diagnosis Date Noted  . Neck pain, chronic 06/09/2019  . Tearfulness  02/22/2019  . Prediabetes 02/22/2019  . Hyperglycemia 02/22/2019  . Hemoglobin A1c less than 7.0% 02/22/2019  . Yeast infection 11/11/2018  . GERD (gastroesophageal reflux disease) 08/19/2018  . Mild tetrahydrocannabinol (THC) abuse 08/19/2018  . Hidradenitis suppurativa 06/05/2018  . Allergic rhinitis 05/08/2018  . OSA (obstructive sleep apnea) 05/13/2017  . Low back pain 02/24/2017  . Sarcoidosis 10/06/2015  . Essential hypertension 10/06/2015  . Asthma 10/06/2015  . Atypical chest pain 10/06/2015  . Anxiety 10/06/2015  . Palpitations 10/06/2015  . RBBB 10/06/2015    Past Surgical History:  Procedure Laterality Date  . AXILLARY LYMPH NODE DISSECTION    . CHOLECYSTECTOMY       OB History    Gravida  5   Para      Term      Preterm      AB      Living  3     SAB      TAB      Ectopic      Multiple      Live Births              Family History  Problem Relation Age of Onset  . Hypertension Mother   . Diabetes Mother     Social History   Tobacco Use  . Smoking status: Never Smoker  . Smokeless tobacco: Never Used  Vaping Use  . Vaping Use: Never  used  Substance Use Topics  . Alcohol use: Yes    Comment: occasional   . Drug use: Yes    Types: Marijuana    Comment: once a week    Home Medications Prior to Admission medications   Medication Sig Start Date End Date Taking? Authorizing Provider  albuterol (PROVENTIL) (2.5 MG/3ML) 0.083% nebulizer solution Take 3 mLs (2.5 mg total) by nebulization every 6 (six) hours as needed for wheezing or shortness of breath. 04/07/19 04/06/20  Vevelyn Francois, NP  albuterol (VENTOLIN HFA) 108 (90 Base) MCG/ACT inhaler INHALE 2 PUFFS INTO THE LUNGS EVERY 6 HOURS FOR UP TO 90 DOSES AS NEEDED FOR WHEEZING 07/09/19   Vevelyn Francois, NP  amLODipine (NORVASC) 10 MG tablet Take 1 tablet (10 mg total) by mouth daily. 04/07/19 07/13/19  Vevelyn Francois, NP  amoxicillin-clavulanate (AUGMENTIN) 875-125 MG tablet Take 1  tablet by mouth 2 (two) times daily for 10 days. 08/31/19 09/10/19  Azzie Glatter, FNP  busPIRone (BUSPAR) 7.5 MG tablet Take 1 tablet (7.5 mg total) by mouth 3 (three) times daily. 08/24/18   Lanae Boast, FNP  cetirizine (ZYRTEC) 10 MG tablet Take 1 tablet (10 mg total) by mouth daily as needed for allergies. 04/07/19   Vevelyn Francois, NP  esomeprazole (NEXIUM) 40 MG capsule TAKE 1 CAPSULE(40 MG) BY MOUTH DAILY 04/07/19   Vevelyn Francois, NP  hydrOXYzine (VISTARIL) 50 MG capsule Take as prescribed as AVS. 04/07/19   Vevelyn Francois, NP  Multiple Vitamins-Minerals (MULTIVITAMINS THER. W/MINERALS) TABS Take 1 tablet by mouth daily.    [provider]  nystatin ointment (MYCOSTATIN) Apply 1 application topically 2 (two) times daily. 04/07/19   Vevelyn Francois, NP  ondansetron (ZOFRAN ODT) 4 MG disintegrating tablet Take 1 tablet (4 mg total) by mouth every 8 (eight) hours as needed for nausea or vomiting. 04/07/19   Vevelyn Francois, NP  oxyCODONE-acetaminophen (PERCOCET) 10-325 MG tablet Take 1 tablet by mouth every 8 (eight) hours as needed for pain.    [provider]  predniSONE (DELTASONE) 20 MG tablet Take 2 tablets (40 mg total) by mouth daily for 4 days. 09/10/19 09/14/19  Volanda Napoleon, PA-C  pregabalin (LYRICA) 50 MG capsule Take 1 capsule (50 mg total) by mouth 2 (two) times daily. 07/13/19   Azzie Glatter, FNP  Vitamin D, Ergocalciferol, (DRISDOL) 1.25 MG (50000 UNIT) CAPS capsule Take 1 capsule (50,000 Units total) by mouth every 7 (seven) days. 06/10/19   Azzie Glatter, FNP    Allergies    Ivp dye [iodinated diagnostic agents]  Review of Systems   Review of Systems  Constitutional: Negative for fever.  Respiratory: Negative for cough and shortness of breath.   Cardiovascular: Negative for chest pain.  Gastrointestinal: Positive for abdominal pain. Negative for nausea and vomiting.  Genitourinary: Negative for dysuria and hematuria.  Musculoskeletal: Positive  for back pain.  Neurological: Negative for weakness, numbness and headaches.  All other systems reviewed and are negative.   Physical Exam Updated Vital Signs BP (!) 136/86   Pulse 76   Temp 97.8 F (36.6 C) (Oral)   Resp 19   Ht 5\' 8"  (1.727 m)   Wt (!) 111.1 kg   SpO2 98%   BMI 37.25 kg/m   Physical Exam Vitals and nursing note reviewed.  Constitutional:      Appearance: Normal appearance. She is well-developed.     Comments: Appears uncomfortable but no acute distress  HENT:  Head: Normocephalic and atraumatic.  Eyes:     General: Lids are normal.     Conjunctiva/sclera: Conjunctivae normal.     Pupils: Pupils are equal, round, and reactive to light.  Neck:     Comments: Full flexion/extension and lateral movement of neck fully intact. No bony midline tenderness. No deformities or crepitus.  Cardiovascular:     Rate and Rhythm: Normal rate and regular rhythm.     Pulses: Normal pulses.     Heart sounds: Normal heart sounds. No murmur heard.  No friction rub. No gallop.   Pulmonary:     Effort: Pulmonary effort is normal.     Breath sounds: Normal breath sounds.     Comments: Lungs clear to auscultation bilaterally.  Symmetric chest rise.  No wheezing, rales, rhonchi. Abdominal:     Palpations: Abdomen is soft. Abdomen is not rigid.     Tenderness: There is abdominal tenderness in the right lower quadrant, suprapubic area and left lower quadrant. There is no right CVA tenderness, left CVA tenderness or guarding.     Comments: Abdomen soft, nondistended.  Mild tenderness palpation of the lower abdomen diffusely no focal point.  No rigidity, guarding.  No CVA tenderness noted bilaterally.  Musculoskeletal:        General: Normal range of motion.     Cervical back: Full passive range of motion without pain.     Comments: No midline T-spine tenderness.  Diffuse tenderness palpation noted entire lower lumbar region that extends midline.  No deformity or crepitus noted.    Skin:    General: Skin is warm and dry.     Capillary Refill: Capillary refill takes less than 2 seconds.  Neurological:     Mental Status: She is alert and oriented to person, place, and time.     Comments: Follows commands, Moves all extremities  5/5 strength to BUE and BLE  Sensation intact throughout all major nerve distributions She is positive straight leg raise on right side.  Psychiatric:        Speech: Speech normal.     ED Results / Procedures / Treatments   Labs (all labs ordered are listed, but only abnormal results are displayed) Labs Reviewed  URINALYSIS, ROUTINE W REFLEX MICROSCOPIC - Abnormal; Notable for the following components:      Result Value   Color, Urine STRAW (*)    All other components within normal limits  COMPREHENSIVE METABOLIC PANEL - Abnormal; Notable for the following components:   Glucose, Bld 104 (*)    Creatinine, Ser 1.02 (*)    Total Bilirubin 0.2 (*)    All other components within normal limits  CBC WITH DIFFERENTIAL/PLATELET - Abnormal; Notable for the following components:   WBC 11.2 (*)    Hemoglobin 11.8 (*)    HCT 34.7 (*)    MCV 74.6 (*)    MCH 25.4 (*)    All other components within normal limits  I-STAT BETA HCG BLOOD, ED ([facility], [facility], [facility] ONLY)    EKG None  Radiology CT ABDOMEN PELVIS WO CONTRAST  Result Date: 09/10/2019 CLINICAL DATA:  Abdominal pain, acute, nonlocalized Patient reports chronic right low back pain. Patient reports recent steroid injection. EXAM: CT ABDOMEN AND PELVIS WITHOUT CONTRAST TECHNIQUE: Multidetector CT imaging of the abdomen and pelvis was performed following the standard protocol without IV contrast. COMPARISON:  CT 09/14/2017 FINDINGS: Lower chest: Upper normal heart size. No pleural fluid or focal airspace disease. Hepatobiliary: Mild hepatic steatosis. No evidence of  focal lesion on noncontrast exam. Clips in the gallbladder fossa postcholecystectomy. No biliary dilatation. Pancreas: No ductal  dilatation or inflammation. Spleen: Normal in size without focal abnormality. Adrenals/Urinary Tract: Normal adrenal glands. No hydronephrosis. No renal calculi. No perinephric edema. Low-density lesions in both kidneys are incompletely characterized on noncontrast exam but typical of cysts. One of the cysts in the upper left kidney has a coarse peripheral calcification. Both ureters are decompressed without stones along the course. Urinary bladder is partially distended. No bladder stone. No bladder wall thickening. Stomach/Bowel: Stomach is nondistended. There is no small bowel obstruction or inflammation. Scattered fecalization of small bowel contents without obstruction or inflammatory change. Normal appendix. Moderate colonic stool burden. Multifocal colonic diverticulosis most prominent in the sigmoid colon. No diverticulitis. Vascular/Lymphatic: Aortic atherosclerosis without aneurysm. Small retroperitoneal lymph nodes are not enlarged by size criteria. Reproductive: Uterus is unremarkable. Bilateral tubal ligation clips. No adnexal mass. Other: Small fat containing umbilical hernia. No free air, free fluid, or intra-abdominal fluid collection. Musculoskeletal: There are no acute or suspicious osseous abnormalities. No CT findings of adverse reaction to related to recent steroid injection. There is multilevel facet hypertrophy. IMPRESSION: 1. No renal stones or obstructive uropathy. 2. No acute abnormality in the abdomen/pelvis. 3. Colonic diverticulosis without diverticulitis. 4. Mild hepatic steatosis. Aortic Atherosclerosis (ICD10-I70.0). Electronically Signed   By: Keith Rake M.D.   On: 09/10/2019 18:06    Procedures Procedures (including critical care time)  Medications Ordered in ED Medications  morphine 4 MG/ML injection 4 mg (4 mg Intravenous Given 09/10/19 1554)  ondansetron (ZOFRAN) injection 4 mg (4 mg Intravenous Given 09/10/19 1554)  fentaNYL (SUBLIMAZE) injection 50 mcg (50 mcg  Intravenous Given 09/10/19 1722)  HYDROmorphone (DILAUDID) injection 0.5 mg (0.5 mg Intravenous Given 09/10/19 1843)    ED Course  I have reviewed the triage vital signs and the nursing notes.  Pertinent labs & imaging results that were available during my care of the patient were reviewed by me and considered in my medical decision making (see chart for details).    MDM Rules/Calculators/A&P                          54 year old female with possible history of chronic low back pain who presents for evaluation of back pain, abdominal pain that began about 4 to 5 days ago.  Describes it as a contraction type pain.  Seen by her pain management doctor and was given steroid injections as well as pain medication with minimal improvement.  No nausea/vomiting/fever.  No urinary complaints.  On initial arrival, she is afebrile, nontoxic-appearing.  Vital signs are stable.  On exam, she has diffuse tenderness in the lower lumbar region.  She also has diffuse tenderness noted to the lower abdomen.  Consider chronic back pain versus GI etiology versus intra-abdominal pathology.  History/physical exam not concerning for cauda equina, spinal abscess.  History/physical exam not concerning for dissection.  History/physical exam not concerning for ovarian torsion.  Patient with no neuro deficits or red flags.  Plan check urine, give pain medication.  I-STAT beta negative.  CMP shows normal BUN and creatinine.  UA negative for any infectious etiology.  CBC shows slight leukocytosis of 11.2.  Hemoglobin stable at 11.8.  Reevaluation.  Patient reports feeling somewhat better after analgesics.  She is still having some abdominal pain.  Repeat evaluation of her abdominal exam shows that she does have some tenderness noted diffusely in the lower abdomen, particularly  in the right lower quadrant.  I discussed with patient regarding her work-up and further treatment options here in the ED.  We engaged in shared  decision-making regarding further evaluation via CT on pelvis.  After discussion with patient, patient opted of CT and pelvis here which I feel is reasonable given her right lower quadrant pain to ensure there is no infectious etiology.  Unfortunately, she has a contrast allergy will plan for CT on pelvis without contrast.  CT abd/pelvis shows no acute abnormalities.  There is no evidence of any infectious etiology in the CT abdomen pelvis.  Normal appendix.  No evidence of reproductive abnormalities.  No MSK findings.  There is mention of an area on the left kidney that is consistent with a cyst.  No evidence of acute kidney stone.  I discussed results with patient.  She is still having some pain.  She has had some improvement with analgesics but has not been able to resolve completely.  She has been ambulatory here in the ED and is able to sit up on her own.  No weakness.  At this time, her exam is not concerning for cauda equina, spinal abscess.  No indication that she needs emergent MRI imaging at this time.  At this time, she is stable.  Patient states she recently got her prescription for Percocet and Robaxin.  Will add on a short course of prednisone to help with acute inflammation.  I suspect there may be some degree of sciatica with this as it does radiate down her right lower extremity and she has positive straight leg raise.  We will give a short course of prednisone for acute symptoms.  I did also discuss with patient regarding kidney cyst seen on CT scan.  At this time, do not feel that this is contributing to her symptoms.  Instructed her that she can follow-up with her primary care doctor for monitoring. At this time, patient exhibits no emergent life-threatening condition that require further evaluation in ED or admission. Patient had ample opportunity for questions and discussion. All patient's questions were answered with full understanding. Strict return precautions discussed. Patient  expresses understanding and agreement to plan.   Portions of this note were generated with Lobbyist. Dictation errors may occur despite best attempts at proofreading.   Final Clinical Impression(s) / ED Diagnoses Final diagnoses:  Acute low back pain, unspecified back pain laterality, unspecified whether sciatica present  Kidney cysts    Rx / DC Orders ED Discharge Orders         Ordered    predniSONE (DELTASONE) 20 MG tablet  Daily     Discontinue  Reprint     09/10/19 2000           Volanda Napoleon, PA-C 09/10/19 2217    Deno Etienne, DO 09/10/19 2257

## 2019-09-13 ENCOUNTER — Other Ambulatory Visit: Payer: Self-pay

## 2019-09-13 ENCOUNTER — Telehealth (INDEPENDENT_AMBULATORY_CARE_PROVIDER_SITE_OTHER): Payer: Medicare Other | Admitting: Family Medicine

## 2019-09-13 DIAGNOSIS — G5793 Unspecified mononeuropathy of bilateral lower limbs: Secondary | ICD-10-CM | POA: Diagnosis not present

## 2019-09-13 DIAGNOSIS — R7303 Prediabetes: Secondary | ICD-10-CM | POA: Diagnosis not present

## 2019-09-13 DIAGNOSIS — Z09 Encounter for follow-up examination after completed treatment for conditions other than malignant neoplasm: Secondary | ICD-10-CM

## 2019-09-13 DIAGNOSIS — I10 Essential (primary) hypertension: Secondary | ICD-10-CM

## 2019-09-13 DIAGNOSIS — L732 Hidradenitis suppurativa: Secondary | ICD-10-CM

## 2019-09-13 DIAGNOSIS — R7309 Other abnormal glucose: Secondary | ICD-10-CM | POA: Diagnosis not present

## 2019-09-13 DIAGNOSIS — M545 Low back pain, unspecified: Secondary | ICD-10-CM

## 2019-09-13 DIAGNOSIS — Z789 Other specified health status: Secondary | ICD-10-CM

## 2019-09-13 DIAGNOSIS — F419 Anxiety disorder, unspecified: Secondary | ICD-10-CM

## 2019-09-13 MED ORDER — PREDNISONE 10 MG PO TABS: ORAL_TABLET | ORAL | 0 refills | Status: DC

## 2019-09-13 NOTE — Progress Notes (Signed)
Virtual Visit via Telephone Note  I connected with Crystal Bullock on 09/19/19 at  3:40 PM EDT by telephone and verified that I am speaking with the correct person using two identifiers.   I discussed the limitations, risks, security and privacy concerns of performing an evaluation and management service by telephone and the availability of in person appointments. I also discussed with the patient that there may be a patient responsible charge related to this service. The patient expressed understanding and agreed to proceed.  Televisit Today Patient Location: Home Provider Location: Office  History of Present Illness:  Social History   Socioeconomic History  . Marital status: Single    Spouse name: Not on file  . Number of children: Not on file  . Years of education: Not on file  . Highest education level: Not on file  Occupational History  . Not on file  Tobacco Use  . Smoking status: Never Smoker  . Smokeless tobacco: Never Used  Vaping Use  . Vaping Use: Never used  Substance and Sexual Activity  . Alcohol use: Yes    Comment: occasional   . Drug use: Yes    Types: Marijuana    Comment: once a week  . Sexual activity: Yes    Birth control/protection: Condom  Other Topics Concern  . Not on file  Social History Narrative  . Not on file   Social Determinants of Health   Financial Resource Strain:   . Difficulty of Paying Living Expenses:   Food Insecurity:   . Worried About Charity fundraiser in the Last Year:   . Arboriculturist in the Last Year:   Transportation Needs:   . Film/video editor (Medical):   Marland Kitchen Lack of Transportation (Non-Medical):   Physical Activity:   . Days of Exercise per Week:   . Minutes of Exercise per Session:   Stress:   . Feeling of Stress :   Social Connections:   . Frequency of Communication with Friends and Family:   . Frequency of Social Gatherings with Friends and Family:   . Attends Religious Services:   . Active  Member of Clubs or Organizations:   . Attends Archivist Meetings:   Marland Kitchen Marital Status:   Intimate Partner Violence:   . Fear of Current or Ex-Partner:   . Emotionally Abused:   Marland Kitchen Physically Abused:   . Sexually Abused:     Past Surgical History:  Procedure Laterality Date  . AXILLARY LYMPH NODE DISSECTION    . CHOLECYSTECTOMY     Family History  Problem Relation Age of Onset  . Hypertension Mother   . Diabetes Mother     Past Medical History:  Diagnosis Date  . Anxiety 10/06/2015  . Asthma   . Atypical chest pain 10/06/2015  . Chronic neck pain   . Fibroid   . Hypertension   . Palpitations 10/06/2015  . RBBB 10/06/2015  . Sarcoidosis   . Scoliosis   . Seasonal allergies   . Sickle cell trait ([place])   . Vitamin D deficiency 05/2019    Allergies  Allergen Reactions  . Ivp Dye [Iodinated Diagnostic Agents]     Hives on hands and feet per pt 08/16/15 pt had scan w/ contrast and had a 4 hr premedication protocol done    Current Status: Since her last office visit, she has had an ED visit on 09/10/2019 for Low Back Pain. Today, she continues to have moderate back pain.  She states that no pain medications works for her at this time. She continues to follow up with Pain Management as needed. She denies fevers, chills, fatigue, recent infections, weight loss, and night sweats. She has not had any headaches, visual changes, dizziness, and falls. No chest pain, heart palpitations, cough and shortness of breath reported. Denies GI problems such as nausea, vomiting, diarrhea, and constipation. She has no reports of blood in stools, dysuria and hematuria. No depression or anxiety, and denies suicidal ideations, homicidal ideations, or auditory hallucinations. She is taking all medications as prescribed.   Observations/Objective:  Telephone Visit   Assessment and Plan:  1. Acute low back pain, unspecified back pain laterality, unspecified whether sciatica present We will  initiated steroid taper today. Monitor.  - predniSONE (DELTASONE) 10 MG tablet; Day #1: Take 6 tablets by mouth Day #2: Take 5 tablets by mouth Day #3: Take 4 tablets by mouth Day #4: Take 3 tablets by mouth Day #5: Take 2 tablets by mouth Day #6: Take 1 tablet, then complete.  Dispense: 21 tablet; Refill: 0  2. Essential hypertension She will continue to take medications as prescribed, to decrease high sodium intake, excessive alcohol intake, increase potassium intake, smoking cessation, and increase physical activity of at least 30 minutes of cardio activity daily. She will continue to follow Heart Healthy or DASH diet.  3. Prediabetes She will continue medication as prescribed, to decrease foods/beverages high in sugars and carbs and follow Heart Healthy or DASH diet. Increase physical activity to at least 30 minutes cardio exercise daily.   4. Hemoglobin A1c less than 7.0%  5. Neuropathy involving both lower extremities - predniSONE (DELTASONE) 10 MG tablet; Day #1: Take 6 tablets by mouth Day #2: Take 5 tablets by mouth Day #3: Take 4 tablets by mouth Day #4: Take 3 tablets by mouth Day #5: Take 2 tablets by mouth Day #6: Take 1 tablet, then complete.  Dispense: 21 tablet; Refill: 0  6. Hidradenitis suppurativa Stable today.   7. Under care of pain management specialist She will continue to follow up with Pain Management as needed.   8. Anxiety Stable.   9. Follow up Keep follow up appointment.  Meds ordered this encounter  Medications  . predniSONE (DELTASONE) 10 MG tablet    Sig: Day #1: Take 6 tablets by mouth Day #2: Take 5 tablets by mouth Day #3: Take 4 tablets by mouth Day #4: Take 3 tablets by mouth Day #5: Take 2 tablets by mouth Day #6: Take 1 tablet, then complete.    Dispense:  21 tablet    Refill:  0    No orders of the defined types were placed in this encounter.   Referral Orders  No referral(s) requested today    Kathe Becton,  MSN,  FNP-BC [facility] [address] [place], [place] [address] [phone]- fax   Follow Up Instructions:    I discussed the assessment and treatment plan with the patient. The patient was provided an opportunity to ask questions and all were answered. The patient agreed with the plan and demonstrated an understanding of the instructions.   The patient was advised to call back or seek an in-person evaluation if the symptoms worsen or if the condition fails to improve as anticipated.  I provided 20 minutes of non-face-to-face time during this encounter.   Azzie Glatter, FNP

## 2019-09-19 ENCOUNTER — Encounter: Payer: Self-pay | Admitting: Family Medicine

## 2019-10-08 DIAGNOSIS — Z79899 Other long term (current) drug therapy: Secondary | ICD-10-CM | POA: Diagnosis not present

## 2019-10-08 DIAGNOSIS — M545 Low back pain: Secondary | ICD-10-CM | POA: Diagnosis not present

## 2019-10-08 DIAGNOSIS — G894 Chronic pain syndrome: Secondary | ICD-10-CM | POA: Diagnosis not present

## 2019-10-13 ENCOUNTER — Ambulatory Visit: Payer: Self-pay | Admitting: Family Medicine

## 2019-10-20 ENCOUNTER — Ambulatory Visit: Payer: Self-pay | Admitting: Family Medicine

## 2019-10-26 ENCOUNTER — Other Ambulatory Visit: Payer: Self-pay

## 2019-10-26 ENCOUNTER — Encounter: Payer: Self-pay | Admitting: Family Medicine

## 2019-10-26 ENCOUNTER — Ambulatory Visit (INDEPENDENT_AMBULATORY_CARE_PROVIDER_SITE_OTHER): Payer: Medicare Other | Admitting: Family Medicine

## 2019-10-26 VITALS — BP 143/83 | HR 89 | Temp 98.4°F | Ht 67.0 in | Wt 246.4 lb

## 2019-10-26 DIAGNOSIS — L732 Hidradenitis suppurativa: Secondary | ICD-10-CM

## 2019-10-26 DIAGNOSIS — M545 Low back pain, unspecified: Secondary | ICD-10-CM

## 2019-10-26 DIAGNOSIS — I10 Essential (primary) hypertension: Secondary | ICD-10-CM

## 2019-10-26 DIAGNOSIS — Z7189 Other specified counseling: Secondary | ICD-10-CM

## 2019-10-26 DIAGNOSIS — F419 Anxiety disorder, unspecified: Secondary | ICD-10-CM | POA: Diagnosis not present

## 2019-10-26 DIAGNOSIS — Z09 Encounter for follow-up examination after completed treatment for conditions other than malignant neoplasm: Secondary | ICD-10-CM

## 2019-10-26 MED ORDER — AMOXICILLIN-POT CLAVULANATE 875-125 MG PO TABS: 1.0000 | ORAL_TABLET | Freq: Two times a day (BID) | ORAL | 0 refills | Status: DC

## 2019-10-26 NOTE — Progress Notes (Signed)
Patient Crystal Bullock and [facility]   Established Patient Office Visit  Subjective:  Patient ID: Crystal Bullock, female    DOB: May 27, 1965  Age: 54 y.o. MRN: [id number]  CC:  Chief Complaint  Patient presents with  . Follow-up    Pt states she needs refill on medication.    HPI Crystal Bullock is a 54 year old female who presents for Follow Up today.    Patient Active Problem List   Diagnosis Date Noted  . Neck pain, chronic 06/09/2019  . Tearfulness 02/22/2019  . Prediabetes 02/22/2019  . Hyperglycemia 02/22/2019  . Hemoglobin A1c less than 7.0% 02/22/2019  . Yeast infection 11/11/2018  . GERD (gastroesophageal reflux disease) 08/19/2018  . Mild tetrahydrocannabinol (THC) abuse 08/19/2018  . Hidradenitis suppurativa 06/05/2018  . Allergic rhinitis 05/08/2018  . OSA (obstructive sleep apnea) 05/13/2017  . Low back pain 02/24/2017  . Sarcoidosis 10/06/2015  . Essential hypertension 10/06/2015  . Asthma 10/06/2015  . Atypical chest pain 10/06/2015  . Anxiety 10/06/2015  . Palpitations 10/06/2015  . RBBB 10/06/2015    Current Status: Since her last office visit, he has had an ED visit on 09/10/2019 for Back Pain. Today,  is doing well with no complaints. She denies fevers, chills, fatigue, recent infections, weight loss, and night sweats. She has not had any headaches, visual changes, dizziness, and falls. No chest pain, heart palpitations, cough and shortness of breath reported. Denies GI problems such as nausea, vomiting, diarrhea, and constipation. She has no reports of blood in stools, dysuria and hematuria. No depression or anxiety, and denies suicidal ideations, homicidal ideations, or auditory hallucinations. She is taking all medications as prescribed. She denies pain today.    Past Medical History:  Diagnosis Date  . Anxiety 10/06/2015  . Asthma   . Atypical chest pain 10/06/2015  . Chronic neck pain   . Fibroid   .  Hypertension   . Palpitations 10/06/2015  . RBBB 10/06/2015  . Sarcoidosis   . Scoliosis   . Seasonal allergies   . Sickle cell trait ([place])   . Vitamin D deficiency 05/2019    Past Surgical History:  Procedure Laterality Date  . AXILLARY LYMPH NODE DISSECTION    . CHOLECYSTECTOMY      Family History  Problem Relation Age of Onset  . Hypertension Mother   . Diabetes Mother     Social History   Socioeconomic History  . Marital status: Single    Spouse name: Not on file  . Number of children: Not on file  . Years of education: Not on file  . Highest education level: Not on file  Occupational History  . Not on file  Tobacco Use  . Smoking status: Never Smoker  . Smokeless tobacco: Never Used  Vaping Use  . Vaping Use: Never used  Substance and Sexual Activity  . Alcohol use: Yes    Comment: occasional   . Drug use: Yes    Types: Marijuana    Comment: once a week  . Sexual activity: Yes    Birth control/protection: Condom  Other Topics Concern  . Not on file  Social History Narrative  . Not on file   Social Determinants of Health   Financial Resource Strain:   . Difficulty of Paying Living Expenses: Not on file  Food Insecurity:   . Worried About Charity fundraiser in the Last Year: Not on file  . Ran Out of Food in  the Last Year: Not on file  Transportation Needs:   . Lack of Transportation (Medical): Not on file  . Lack of Transportation (Non-Medical): Not on file  Physical Activity:   . Days of Exercise per Week: Not on file  . Minutes of Exercise per Session: Not on file  Stress:   . Feeling of Stress : Not on file  Social Connections:   . Frequency of Communication with Friends and Family: Not on file  . Frequency of Social Gatherings with Friends and Family: Not on file  . Attends Religious Services: Not on file  . Active Member of Clubs or Organizations: Not on file  . Attends Archivist Meetings: Not on file  . Marital Status: Not on  file  Intimate Partner Violence:   . Fear of Current or Ex-Partner: Not on file  . Emotionally Abused: Not on file  . Physically Abused: Not on file  . Sexually Abused: Not on file    Outpatient Medications Prior to Visit  Medication Sig Dispense Refill  . albuterol (PROVENTIL) (2.5 MG/3ML) 0.083% nebulizer solution Take 3 mLs (2.5 mg total) by nebulization every 6 (six) hours as needed for wheezing or shortness of breath. 75 mL 12  . albuterol (VENTOLIN HFA) 108 (90 Base) MCG/ACT inhaler INHALE 2 PUFFS INTO THE LUNGS EVERY 6 HOURS FOR UP TO 90 DOSES AS NEEDED FOR WHEEZING 6.7 g 0  . busPIRone (BUSPAR) 7.5 MG tablet Take 1 tablet (7.5 mg total) by mouth 3 (three) times daily. 60 tablet 2  . cetirizine (ZYRTEC) 10 MG tablet Take 1 tablet (10 mg total) by mouth daily as needed for allergies. 30 tablet 2  . esomeprazole (NEXIUM) 40 MG capsule TAKE 1 CAPSULE(40 MG) BY MOUTH DAILY 30 capsule 3  . hydrOXYzine (VISTARIL) 50 MG capsule Take as prescribed as AVS. 120 capsule 1  . Multiple Vitamins-Minerals (MULTIVITAMINS THER. W/MINERALS) TABS Take 1 tablet by mouth daily.    Marland Kitchen nystatin ointment (MYCOSTATIN) Apply 1 application topically 2 (two) times daily. 30 g 0  . ondansetron (ZOFRAN ODT) 4 MG disintegrating tablet Take 1 tablet (4 mg total) by mouth every 8 (eight) hours as needed for nausea or vomiting. 20 tablet 0  . oxyCODONE-acetaminophen (PERCOCET) 10-325 MG tablet Take 1 tablet by mouth every 8 (eight) hours as needed for pain.    . pregabalin (LYRICA) 50 MG capsule Take 1 capsule (50 mg total) by mouth 2 (two) times daily. 60 capsule 3  . Vitamin D, Ergocalciferol, (DRISDOL) 1.25 MG (50000 UNIT) CAPS capsule Take 1 capsule (50,000 Units total) by mouth every 7 (seven) days. 5 capsule 6  . amLODipine (NORVASC) 10 MG tablet Take 1 tablet (10 mg total) by mouth daily. 90 tablet 1  . predniSONE (DELTASONE) 10 MG tablet Day #1: Take 6 tablets by mouth Day #2: Take 5 tablets by mouth Day #3:  Take 4 tablets by mouth Day #4: Take 3 tablets by mouth Day #5: Take 2 tablets by mouth Day #6: Take 1 tablet, then complete. (Patient not taking: Reported on 10/26/2019) 21 tablet 0   No facility-administered medications prior to visit.    Allergies  Allergen Reactions  . Ivp Dye [Iodinated Diagnostic Agents]     Hives on hands and feet per pt 08/16/15 pt had scan w/ contrast and had a 4 hr premedication protocol done    ROS Review of Systems  Constitutional: Negative.   HENT: Negative.   Eyes: Negative.   Respiratory: Negative.  Cardiovascular: Negative.   Gastrointestinal: Positive for abdominal distention.  Endocrine: Negative.   Genitourinary: Negative.   Musculoskeletal: Positive for arthralgias (generalized joint pain).  Skin:       Recurrent Chronic Hidradenitis Suppurative bilateral axillary.    Allergic/Immunologic: Negative.   Neurological: Positive for dizziness (occasional ) and headaches (occasional ).  Hematological: Negative.   Psychiatric/Behavioral: Negative.     Objective:    Physical Exam Vitals and nursing note reviewed.  Constitutional:      Appearance: Normal appearance.  HENT:     Head: Normocephalic and atraumatic.     Nose: Nose normal.     Mouth/Throat:     Mouth: Mucous membranes are moist.     Pharynx: Oropharynx is clear.  Cardiovascular:     Rate and Rhythm: Normal rate and regular rhythm.     Pulses: Normal pulses.     Heart sounds: Normal heart sounds.  Pulmonary:     Effort: Pulmonary effort is normal.     Breath sounds: Normal breath sounds.  Abdominal:     General: Bowel sounds are normal.     Palpations: Abdomen is soft.  Musculoskeletal:        General: Normal range of motion.     Cervical back: Normal range of motion and neck supple.  Skin:    General: Skin is warm and dry.     Comments: Chronic Hidradenitis Suppurative.   Neurological:     General: No focal deficit present.     Mental Status: She is alert and  oriented to person, place, and time.  Psychiatric:        Mood and Affect: Mood normal.        Behavior: Behavior normal.        Thought Content: Thought content normal.        Judgment: Judgment normal.     BP (!) 143/83 (BP Location: Left Arm, Patient Position: Sitting, Cuff Size: Large)   Pulse 89   Temp 98.4 F (36.9 C)   Ht 5\' 7"  (1.702 m)   Wt 246 lb 6.4 oz (111.8 kg)   SpO2 97%   BMI 38.59 kg/m  Wt Readings from Last 3 Encounters:  10/26/19 246 lb 6.4 oz (111.8 kg)  09/10/19 (!) 245 lb (111.1 kg)  07/13/19 247 lb 3.2 oz (112.1 kg)    Health Maintenance Due  Topic Date Due  . Hepatitis C Screening  Never done  . COVID-19 Vaccine (1) Never done  . TETANUS/TDAP  Never done  . COLONOSCOPY  Never done  . PAP SMEAR-Modifier  10/04/2018  . MAMMOGRAM  05/13/2019    There are no preventive care reminders to display for this patient.  Lab Results  Component Value Date   TSH 0.866 06/08/2019   Lab Results  Component Value Date   WBC 11.2 (H) 09/10/2019   HGB 11.8 (L) 09/10/2019   HCT 34.7 (L) 09/10/2019   MCV 74.6 (L) 09/10/2019   PLT 288 09/10/2019   Lab Results  Component Value Date   NA 141 09/10/2019   K 3.5 09/10/2019   CO2 27 09/10/2019   GLUCOSE 104 (H) 09/10/2019   BUN 18 09/10/2019   CREATININE 1.02 (H) 09/10/2019   BILITOT 0.2 (L) 09/10/2019   ALKPHOS 55 09/10/2019   AST 19 09/10/2019   ALT 18 09/10/2019   PROT 7.2 09/10/2019   ALBUMIN 3.5 09/10/2019   CALCIUM 8.9 09/10/2019   ANIONGAP 10 09/10/2019   No results found for: CHOL No  results found for: HDL No results found for: LDLCALC No results found for: TRIG No results found for: [facility] Lab Results  Component Value Date   HGBA1C 6.7 (A) 06/08/2019      Assessment & Plan:   1. Hidradenitis suppurativa We will initiate antibiotic today.  - amoxicillin-clavulanate (AUGMENTIN) 875-125 MG tablet; Take 1 tablet by mouth 2 (two) times daily for 10 days.  Dispense: 20 tablet; Refill:  0  2. Acute low back pain, unspecified back pain laterality, unspecified whether sciatica present  3. Essential hypertension The current medical regimen is effective; blood pressure is stable at 143/83 today; continue present plan and medications as prescribed. She will continue to take medications as prescribed, to decrease high sodium intake, excessive alcohol intake, increase potassium intake, smoking cessation, and increase physical activity of at least 30 minutes of cardio activity daily. She will continue to follow Heart Healthy or DASH diet.  4. Anxiety Stable.  5. Educated about COVID-19 virus infection Patient continues to be unsure about taking vaccine.   6. Follow up She will follow up in 7 months.   Meds ordered this encounter  Medications  . amoxicillin-clavulanate (AUGMENTIN) 875-125 MG tablet    Sig: Take 1 tablet by mouth 2 (two) times daily for 10 days.    Dispense:  20 tablet    Refill:  0    No orders of the defined types were placed in this encounter.   Referral Orders  No referral(s) requested today    Kathe Becton,  MSN, FNP-BC [facility] [address] [place], [place] [address] [phone] fax    Problem List Items Addressed This Visit      Cardiovascular and Mediastinum   Essential hypertension     Musculoskeletal and Integument   Hidradenitis suppurativa - Primary   Relevant Medications   amoxicillin-clavulanate (AUGMENTIN) 875-125 MG tablet     Other   Anxiety   Low back pain    Other Visit Diagnoses    Educated about COVID-19 virus infection       Follow up          Meds ordered this encounter  Medications  . amoxicillin-clavulanate (AUGMENTIN) 875-125 MG tablet    Sig: Take 1 tablet by mouth 2 (two) times daily for 10 days.    Dispense:  20 tablet    Refill:  0    Follow-up: Return for Labs/OV.    Azzie Glatter,  FNP

## 2019-10-27 ENCOUNTER — Other Ambulatory Visit: Payer: Self-pay | Admitting: Nurse Practitioner

## 2019-10-27 DIAGNOSIS — J4520 Mild intermittent asthma, uncomplicated: Secondary | ICD-10-CM

## 2019-10-27 DIAGNOSIS — D869 Sarcoidosis, unspecified: Secondary | ICD-10-CM

## 2019-10-28 ENCOUNTER — Encounter: Payer: Self-pay | Admitting: Family Medicine

## 2019-11-09 DIAGNOSIS — Z79899 Other long term (current) drug therapy: Secondary | ICD-10-CM | POA: Diagnosis not present

## 2019-11-09 DIAGNOSIS — G894 Chronic pain syndrome: Secondary | ICD-10-CM | POA: Diagnosis not present

## 2019-11-09 DIAGNOSIS — M545 Low back pain: Secondary | ICD-10-CM | POA: Diagnosis not present

## 2019-11-22 ENCOUNTER — Other Ambulatory Visit: Payer: Self-pay | Admitting: Family Medicine

## 2019-11-22 ENCOUNTER — Telehealth: Payer: Self-pay

## 2019-11-22 DIAGNOSIS — L732 Hidradenitis suppurativa: Secondary | ICD-10-CM

## 2019-11-22 MED ORDER — CEPHALEXIN 500 MG PO CAPS: 500.0000 mg | ORAL_CAPSULE | Freq: Two times a day (BID) | ORAL | 0 refills | Status: AC

## 2019-11-28 ENCOUNTER — Other Ambulatory Visit: Payer: Self-pay | Admitting: Nurse Practitioner

## 2019-11-28 DIAGNOSIS — K219 Gastro-esophageal reflux disease without esophagitis: Secondary | ICD-10-CM

## 2019-11-29 NOTE — Telephone Encounter (Signed)
Please see medication request.

## 2019-12-01 NOTE — Telephone Encounter (Signed)
Hi

## 2019-12-06 ENCOUNTER — Other Ambulatory Visit: Payer: Self-pay | Admitting: Nurse Practitioner

## 2019-12-06 DIAGNOSIS — J309 Allergic rhinitis, unspecified: Secondary | ICD-10-CM

## 2019-12-06 MED ORDER — AMOXICILLIN-POT CLAVULANATE 875-125 MG PO TABS: 1.0000 | ORAL_TABLET | Freq: Two times a day (BID) | ORAL | 0 refills | Status: AC

## 2019-12-06 MED ORDER — CETIRIZINE HCL 10 MG PO TABS: 10.0000 mg | ORAL_TABLET | Freq: Every day | ORAL | 2 refills | Status: DC | PRN

## 2019-12-06 NOTE — Progress Notes (Signed)
   [facility] [place], [place]  [address] Phone:  [phone]   Fax:  [phone] Epidermal Cyst Patient complains of a subcutaneous nodule  This has been present for a few days.  Patient does have a history of epidermal inclusion cysts.  Pt states that Augmentin is the only medication that is effective and she was told that she could call an anbx.

## 2019-12-08 ENCOUNTER — Ambulatory Visit: Payer: Self-pay | Admitting: Family Medicine

## 2019-12-09 DIAGNOSIS — Z79899 Other long term (current) drug therapy: Secondary | ICD-10-CM | POA: Diagnosis not present

## 2019-12-09 DIAGNOSIS — M545 Low back pain, unspecified: Secondary | ICD-10-CM | POA: Diagnosis not present

## 2019-12-09 DIAGNOSIS — G894 Chronic pain syndrome: Secondary | ICD-10-CM | POA: Diagnosis not present

## 2019-12-13 ENCOUNTER — Other Ambulatory Visit: Payer: Self-pay | Admitting: Family Medicine

## 2019-12-13 DIAGNOSIS — B379 Candidiasis, unspecified: Secondary | ICD-10-CM

## 2019-12-15 ENCOUNTER — Other Ambulatory Visit: Payer: Self-pay | Admitting: Family Medicine

## 2019-12-15 ENCOUNTER — Encounter: Payer: Self-pay | Admitting: Family Medicine

## 2019-12-15 ENCOUNTER — Telehealth: Payer: Self-pay | Admitting: Family Medicine

## 2019-12-15 DIAGNOSIS — B379 Candidiasis, unspecified: Secondary | ICD-10-CM

## 2019-12-15 MED ORDER — FLUCONAZOLE 150 MG PO TABS: 150.0000 mg | ORAL_TABLET | Freq: Once | ORAL | 0 refills | Status: AC

## 2019-12-16 ENCOUNTER — Other Ambulatory Visit: Payer: Self-pay | Admitting: Family Medicine

## 2019-12-16 DIAGNOSIS — B379 Candidiasis, unspecified: Secondary | ICD-10-CM

## 2019-12-16 MED ORDER — FLUCONAZOLE 150 MG PO TABS: 150.0000 mg | ORAL_TABLET | Freq: Once | ORAL | 0 refills | Status: AC

## 2019-12-16 NOTE — Telephone Encounter (Signed)
Sent to provider 

## 2019-12-31 DIAGNOSIS — G4733 Obstructive sleep apnea (adult) (pediatric): Secondary | ICD-10-CM | POA: Diagnosis not present

## 2020-01-04 DIAGNOSIS — G894 Chronic pain syndrome: Secondary | ICD-10-CM | POA: Diagnosis not present

## 2020-01-04 DIAGNOSIS — Z79899 Other long term (current) drug therapy: Secondary | ICD-10-CM | POA: Diagnosis not present

## 2020-01-04 DIAGNOSIS — M545 Low back pain, unspecified: Secondary | ICD-10-CM | POA: Diagnosis not present

## 2020-02-01 ENCOUNTER — Encounter: Payer: Self-pay | Admitting: Family Medicine

## 2020-02-01 ENCOUNTER — Other Ambulatory Visit: Payer: Self-pay | Admitting: Family Medicine

## 2020-02-01 DIAGNOSIS — Z79899 Other long term (current) drug therapy: Secondary | ICD-10-CM | POA: Diagnosis not present

## 2020-02-01 DIAGNOSIS — M542 Cervicalgia: Secondary | ICD-10-CM | POA: Diagnosis not present

## 2020-02-01 DIAGNOSIS — M545 Low back pain, unspecified: Secondary | ICD-10-CM | POA: Diagnosis not present

## 2020-02-01 DIAGNOSIS — G894 Chronic pain syndrome: Secondary | ICD-10-CM | POA: Diagnosis not present

## 2020-02-01 DIAGNOSIS — B349 Viral infection, unspecified: Secondary | ICD-10-CM

## 2020-02-01 MED ORDER — AMOXICILLIN-POT CLAVULANATE 875-125 MG PO TABS: 1.0000 | ORAL_TABLET | Freq: Two times a day (BID) | ORAL | 0 refills | Status: DC

## 2020-02-21 ENCOUNTER — Other Ambulatory Visit: Payer: Self-pay

## 2020-02-21 ENCOUNTER — Encounter: Payer: Self-pay | Admitting: Family Medicine

## 2020-02-21 ENCOUNTER — Telehealth (INDEPENDENT_AMBULATORY_CARE_PROVIDER_SITE_OTHER): Payer: Medicare Other | Admitting: Family Medicine

## 2020-02-21 DIAGNOSIS — F419 Anxiety disorder, unspecified: Secondary | ICD-10-CM

## 2020-02-21 DIAGNOSIS — L732 Hidradenitis suppurativa: Secondary | ICD-10-CM

## 2020-02-21 DIAGNOSIS — T3695XA Adverse effect of unspecified systemic antibiotic, initial encounter: Secondary | ICD-10-CM

## 2020-02-21 DIAGNOSIS — Z09 Encounter for follow-up examination after completed treatment for conditions other than malignant neoplasm: Secondary | ICD-10-CM | POA: Diagnosis not present

## 2020-02-21 DIAGNOSIS — B349 Viral infection, unspecified: Secondary | ICD-10-CM | POA: Diagnosis not present

## 2020-02-21 DIAGNOSIS — B379 Candidiasis, unspecified: Secondary | ICD-10-CM | POA: Diagnosis not present

## 2020-02-21 MED ORDER — AMOXICILLIN-POT CLAVULANATE 875-125 MG PO TABS: 1.0000 | ORAL_TABLET | Freq: Two times a day (BID) | ORAL | 0 refills | Status: DC

## 2020-02-21 NOTE — Progress Notes (Signed)
Virtual Visit via Telephone Note  I connected with Crystal Bullock on 02/24/20 at 10:00 AM EST by telephone and verified that I am speaking with the correct person using two identifiers.  Location: Patient: Home Provider: Office   I discussed the limitations, risks, security and privacy concerns of performing an evaluation and management service by telephone and the availability of in person appointments. I also discussed with the patient that there may be a patient responsible charge related to this service. The patient expressed understanding and agreed to proceed.   History of Present Illness:  Patient Active Problem List   Diagnosis Date Noted  . Neck pain, chronic 06/09/2019  . Tearfulness 02/22/2019  . Prediabetes 02/22/2019  . Hyperglycemia 02/22/2019  . Hemoglobin A1c less than 7.0% 02/22/2019  . Yeast infection 11/11/2018  . GERD (gastroesophageal reflux disease) 08/19/2018  . Mild tetrahydrocannabinol (THC) abuse 08/19/2018  . Hidradenitis suppurativa 06/05/2018  . Allergic rhinitis 05/08/2018  . OSA (obstructive sleep apnea) 05/13/2017  . Low back pain 02/24/2017  . Sarcoidosis 10/06/2015  . Essential hypertension 10/06/2015  . Asthma 10/06/2015  . Atypical chest pain 10/06/2015  . Anxiety 10/06/2015  . Palpitations 10/06/2015  . RBBB 10/06/2015   Current Status: Since her last office visit, she is doing well with no complaints. She has c/o re-occurring boils in inner thighs. She denies fevers, chills, fatigue, recent infections, weight loss, and night sweats. She has not had any headaches, visual changes, dizziness, and falls. No chest pain, heart palpitations, cough and shortness of breath reported. Denies GI problems such as nausea, vomiting, diarrhea, and constipation. She has no reports of blood in stools, dysuria and hematuria. No depression or anxiety, and denies suicidal ideations, homicidal ideations, or auditory hallucinations. She is taking all  medications as prescribed. She denies pain today.    Observations/Objective:  Telephone Virtual Visit   Assessment and Plan:  1. Hidradenitis suppurativa - amoxicillin-clavulanate (AUGMENTIN) 875-125 MG tablet; Take 1 tablet by mouth 2 (two) times daily for 10 days.  Dispense: 20 tablet; Refill: 0  2. Viral infection - amoxicillin-clavulanate (AUGMENTIN) 875-125 MG tablet; Take 1 tablet by mouth 2 (two) times daily for 10 days.  Dispense: 20 tablet; Refill: 0  3. Antibiotic-induced yeast infection - fluconazole (DIFLUCAN) 150 MG tablet; Take 1 tablet (150 mg total) by mouth once for 1 dose.  Dispense: 1 tablet; Refill: 0  4. Anxiety  5. Follow up She will follow up in 3 months.   Meds ordered this encounter  Medications  . amoxicillin-clavulanate (AUGMENTIN) 875-125 MG tablet    Sig: Take 1 tablet by mouth 2 (two) times daily for 10 days.    Dispense:  20 tablet    Refill:  0  . fluconazole (DIFLUCAN) 150 MG tablet    Sig: Take 1 tablet (150 mg total) by mouth once for 1 dose.    Dispense:  1 tablet    Refill:  0    No orders of the defined types were placed in this encounter.   Referral Orders  No referral(s) requested today    Kathe Becton, MSN, ANE, FNP-BC [facility] [address] [place], [place] [address] [phone]- fax    I discussed the assessment and treatment plan with the patient. The patient was provided an opportunity to ask questions and all were answered. The patient agreed with the plan and demonstrated an understanding of the instructions.   The patient  was advised to call back or seek an in-person evaluation if the symptoms worsen or if the condition fails to improve as anticipated.  I provided 20 minutes of non-face-to-face time during this encounter.   Azzie Glatter, FNP

## 2020-02-24 ENCOUNTER — Encounter: Payer: Self-pay | Admitting: Family Medicine

## 2020-02-24 MED ORDER — FLUCONAZOLE 150 MG PO TABS: 150.0000 mg | ORAL_TABLET | Freq: Once | ORAL | 0 refills | Status: AC

## 2020-02-25 ENCOUNTER — Other Ambulatory Visit: Payer: Self-pay | Admitting: Nurse Practitioner

## 2020-02-25 DIAGNOSIS — J309 Allergic rhinitis, unspecified: Secondary | ICD-10-CM

## 2020-02-25 DIAGNOSIS — J4520 Mild intermittent asthma, uncomplicated: Secondary | ICD-10-CM

## 2020-02-25 DIAGNOSIS — D869 Sarcoidosis, unspecified: Secondary | ICD-10-CM

## 2020-02-26 ENCOUNTER — Other Ambulatory Visit: Payer: Self-pay | Admitting: Nurse Practitioner

## 2020-02-26 DIAGNOSIS — K219 Gastro-esophageal reflux disease without esophagitis: Secondary | ICD-10-CM

## 2020-03-08 ENCOUNTER — Ambulatory Visit (INDEPENDENT_AMBULATORY_CARE_PROVIDER_SITE_OTHER): Payer: Medicare Other | Admitting: Family Medicine

## 2020-03-08 ENCOUNTER — Other Ambulatory Visit: Payer: Self-pay

## 2020-03-08 ENCOUNTER — Encounter: Payer: Self-pay | Admitting: Family Medicine

## 2020-03-08 VITALS — BP 146/83 | HR 88 | Temp 97.9°F | Ht 67.0 in | Wt 251.0 lb

## 2020-03-08 DIAGNOSIS — M542 Cervicalgia: Secondary | ICD-10-CM

## 2020-03-08 DIAGNOSIS — G894 Chronic pain syndrome: Secondary | ICD-10-CM | POA: Diagnosis not present

## 2020-03-08 DIAGNOSIS — Z789 Other specified health status: Secondary | ICD-10-CM | POA: Diagnosis not present

## 2020-03-08 DIAGNOSIS — Z09 Encounter for follow-up examination after completed treatment for conditions other than malignant neoplasm: Secondary | ICD-10-CM

## 2020-03-08 DIAGNOSIS — F419 Anxiety disorder, unspecified: Secondary | ICD-10-CM

## 2020-03-08 DIAGNOSIS — M545 Low back pain, unspecified: Secondary | ICD-10-CM | POA: Diagnosis not present

## 2020-03-08 DIAGNOSIS — I10 Essential (primary) hypertension: Secondary | ICD-10-CM

## 2020-03-08 DIAGNOSIS — Z79899 Other long term (current) drug therapy: Secondary | ICD-10-CM | POA: Diagnosis not present

## 2020-03-08 DIAGNOSIS — G8929 Other chronic pain: Secondary | ICD-10-CM | POA: Diagnosis not present

## 2020-03-08 DIAGNOSIS — L732 Hidradenitis suppurativa: Secondary | ICD-10-CM | POA: Diagnosis not present

## 2020-03-08 NOTE — Progress Notes (Signed)
Patient [facility]   Established Patient Office Visit  Subjective:  Patient ID: Crystal Bullock, female    DOB: [birth year]  Age: 55 y.o. MRN: [id number]  CC:  Chief Complaint  Patient presents with  . Follow-up    Follow up , headaches, upper back pain , taking pain medication , heating pad     HPI Crystal Bullock is a 55 year old female who presents for Follow Up today.    Patient Active Problem List   Diagnosis Date Noted  . Neck pain, chronic 06/09/2019  . Tearfulness 02/22/2019  . Prediabetes 02/22/2019  . Hyperglycemia 02/22/2019  . Hemoglobin A1c less than 7.0% 02/22/2019  . Yeast infection 11/11/2018  . GERD (gastroesophageal reflux disease) 08/19/2018  . Mild tetrahydrocannabinol (THC) abuse 08/19/2018  . Hidradenitis suppurativa 06/05/2018  . Allergic rhinitis 05/08/2018  . OSA (obstructive sleep apnea) 05/13/2017  . Low back pain 02/24/2017  . Sarcoidosis 10/06/2015  . Essential hypertension 10/06/2015  . Asthma 10/06/2015  . Atypical chest pain 10/06/2015  . Anxiety 10/06/2015  . Palpitations 10/06/2015  . RBBB 10/06/2015    Current Status: Since her last office visit, she has c/o neck and head pain off and on X 6 months. She believes that it her mattress and pillows. She denies visual changes, chest pain, cough, shortness of breath, heart palpitations, and falls. She has occasional headaches and dizziness with position changes. Denies severe headaches, confusion, seizures, double vision, and blurred vision, nausea and vomiting. She denies fevers, chills, fatigue, recent infections, weight loss, and night sweats. Denies GI problems such as diarrhea, and constipation. She has no reports of blood in stools, dysuria and hematuria. No depression or anxiety reported today. She is taking all medications as prescribed.   Past Medical History:  Diagnosis Date  . Anxiety 10/06/2015  . Asthma   . Atypical chest pain  10/06/2015  . Chronic neck pain   . Fibroid   . Hypertension   . Palpitations 10/06/2015  . RBBB 10/06/2015  . Sarcoidosis   . Scoliosis   . Seasonal allergies   . Sickle cell trait ([place])   . Vitamin D deficiency 05/2019    Past Surgical History:  Procedure Laterality Date  . AXILLARY LYMPH NODE DISSECTION    . CHOLECYSTECTOMY      Family History  Problem Relation Age of Onset  . Hypertension Mother   . Diabetes Mother     Social History   Socioeconomic History  . Marital status: Single    Spouse name: Not on file  . Number of children: Not on file  . Years of education: Not on file  . Highest education level: Not on file  Occupational History  . Not on file  Tobacco Use  . Smoking status: Never Smoker  . Smokeless tobacco: Never Used  Vaping Use  . Vaping Use: Never used  Substance and Sexual Activity  . Alcohol use: Yes    Comment: occasional   . Drug use: Yes    Types: Marijuana    Comment: once a week  . Sexual activity: Yes    Birth control/protection: Condom  Other Topics Concern  . Not on file  Social History Narrative  . Not on file   Social Determinants of Health   Financial Resource Strain: Not on file  Food Insecurity: Not on file  Transportation Needs: Not on file  Physical Activity: Not on file  Stress: Not on file  Social Connections: Not on file  Intimate Partner Violence: Not on file    Outpatient Medications Prior to Visit  Medication Sig Dispense Refill  . albuterol (PROVENTIL) (2.5 MG/3ML) 0.083% nebulizer solution Take 3 mLs (2.5 mg total) by nebulization every 6 (six) hours as needed for wheezing or shortness of breath. 75 mL 12  . albuterol (VENTOLIN HFA) 108 (90 Base) MCG/ACT inhaler INHALE 2 PUFFS INTO THE LUNGS EVERY 6 HOURS FOR UP TO 90 DOSES AS NEEDED FOR WHEEZING 6.7 g 4  . amLODipine (NORVASC) 10 MG tablet Take 1 tablet (10 mg total) by mouth daily. 90 tablet 1  . busPIRone (BUSPAR) 7.5 MG tablet Take 1 tablet (7.5 mg  total) by mouth 3 (three) times daily. 60 tablet 2  . cetirizine (ZYRTEC) 10 MG tablet TAKE 1 TABLET(10 MG) BY MOUTH DAILY AS NEEDED FOR ALLERGIES 30 tablet 2  . esomeprazole (NEXIUM) 40 MG capsule TAKE 1 CAPSULE(40 MG) BY MOUTH DAILY 30 capsule 3  . hydrOXYzine (VISTARIL) 50 MG capsule Take as prescribed as AVS. 120 capsule 1  . Multiple Vitamins-Minerals (MULTIVITAMINS THER. W/MINERALS) TABS Take 1 tablet by mouth daily.    Marland Kitchen nystatin ointment (MYCOSTATIN) Apply 1 application topically 2 (two) times daily. 30 g 0  . ondansetron (ZOFRAN ODT) 4 MG disintegrating tablet Take 1 tablet (4 mg total) by mouth every 8 (eight) hours as needed for nausea or vomiting. 20 tablet 0  . oxyCODONE-acetaminophen (PERCOCET) 10-325 MG tablet Take 1 tablet by mouth every 8 (eight) hours as needed for pain.    . pregabalin (LYRICA) 50 MG capsule Take 1 capsule (50 mg total) by mouth 2 (two) times daily. 60 capsule 3  . Vitamin D, Ergocalciferol, (DRISDOL) 1.25 MG (50000 UNIT) CAPS capsule Take 1 capsule (50,000 Units total) by mouth every 7 (seven) days. 5 capsule 6  . esomeprazole (NEXIUM) 40 MG capsule TAKE 1 CAPSULE(40 MG) BY MOUTH DAILY 30 capsule 3   No facility-administered medications prior to visit.    Allergies  Allergen Reactions  . Ivp Dye [Iodinated Diagnostic Agents]     Hives on hands and feet per pt 08/16/15 pt had scan w/ contrast and had a 4 hr premedication protocol done    ROS Review of Systems  Constitutional: Negative.   HENT: Negative.   Eyes: Negative.   Respiratory: Negative.   Cardiovascular: Negative.   Gastrointestinal: Positive for abdominal distention (obese).  Endocrine: Negative.   Genitourinary: Negative.   Musculoskeletal: Positive for arthralgias (generalized joint pain).  Skin: Negative.   Allergic/Immunologic: Negative.   Neurological: Positive for dizziness (occasional ) and headaches (occasional ).  Hematological: Negative.   Psychiatric/Behavioral: The patient  is hyperactive.       Objective:    Physical Exam Vitals and nursing note reviewed.  Constitutional:      Appearance: Normal appearance.  HENT:     Head: Normocephalic and atraumatic.  Cardiovascular:     Rate and Rhythm: Normal rate and regular rhythm.     Pulses: Normal pulses.     Heart sounds: Normal heart sounds.  Pulmonary:     Effort: Pulmonary effort is normal.     Breath sounds: Normal breath sounds.  Abdominal:     General: Bowel sounds are normal.     Palpations: Abdomen is soft.  Musculoskeletal:        General: Normal range of motion.     Cervical back: Normal range of motion and neck supple.  Skin:    General: Skin  is warm and dry.  Neurological:     General: No focal deficit present.     Mental Status: She is alert and oriented to person, place, and time.  Psychiatric:        Mood and Affect: Mood normal.        Behavior: Behavior normal.        Thought Content: Thought content normal.        Judgment: Judgment normal.     BP (!) 146/83 (BP Location: Left Arm, Patient Position: Sitting, Cuff Size: Normal)   Pulse 88   Temp 97.9 F (36.6 C) (Temporal)   Ht 5\' 7"  (1.702 m)   Wt 251 lb (113.9 kg)   SpO2 99%   BMI 39.31 kg/m  Wt Readings from Last 3 Encounters:  03/08/20 251 lb (113.9 kg)  10/26/19 246 lb 6.4 oz (111.8 kg)  09/10/19 (!) 245 lb (111.1 kg)     Health Maintenance Due  Topic Date Due  . Hepatitis C Screening  Never done  . COVID-19 Vaccine (1) Never done  . TETANUS/TDAP  Never done  . COLONOSCOPY (Pts 45-28yrs Insurance coverage will need to be confirmed)  Never done  . PAP SMEAR-Modifier  10/04/2018  . MAMMOGRAM  05/13/2019    There are no preventive care reminders to display for this patient.  Lab Results  Component Value Date   TSH 0.866 06/08/2019   Lab Results  Component Value Date   WBC 11.2 (H) 09/10/2019   HGB 11.8 (L) 09/10/2019   HCT 34.7 (L) 09/10/2019   MCV 74.6 (L) 09/10/2019   PLT 288 09/10/2019   Lab  Results  Component Value Date   NA 141 09/10/2019   K 3.5 09/10/2019   CO2 27 09/10/2019   GLUCOSE 104 (H) 09/10/2019   BUN 18 09/10/2019   CREATININE 1.02 (H) 09/10/2019   BILITOT 0.2 (L) 09/10/2019   ALKPHOS 55 09/10/2019   AST 19 09/10/2019   ALT 18 09/10/2019   PROT 7.2 09/10/2019   ALBUMIN 3.5 09/10/2019   CALCIUM 8.9 09/10/2019   ANIONGAP 10 09/10/2019   No results found for: CHOL No results found for: HDL No results found for: LDLCALC No results found for: TRIG No results found for: [facility] Lab Results  Component Value Date   HGBA1C 6.7 (A) 06/08/2019   Assessment & Plan:   1. Under care of pain management specialist She will continue to follow up with pain management as needed.   2. Neck pain, chronic  3. Chronic low back pain, unspecified back pain laterality, unspecified whether sciatica present  4. Essential hypertension The current medical regimen is effective; blood pressure is stable at 146/83 today; continue present plan and medications as prescribed. She will continue to take medications as prescribed, to decrease high sodium intake, excessive alcohol intake, increase potassium intake, smoking cessation, and increase physical activity of at least 30 minutes of cardio activity daily. She will continue to follow Heart Healthy or DASH diet.  5. Anxiety  6. Follow up She will follow up in 6 months for Annual Physical and Labwork.   No orders of the defined types were placed in this encounter.  No orders of the defined types were placed in this encounter.   Referral Orders  No referral(s) requested today   Kathe Becton, MSN, ANE, FNP-BC [facility] [address] [place], [place] [address] [phone]- fax    Problem List  Items Addressed This Visit      Cardiovascular and Mediastinum   Essential hypertension     Other   Anxiety   Low back  pain   Neck pain, chronic    Other Visit Diagnoses    Under care of pain management specialist    -  Primary   Follow up          No orders of the defined types were placed in this encounter.   Follow-up: No follow-ups on file.    Azzie Glatter, FNP

## 2020-03-23 ENCOUNTER — Telehealth: Payer: Self-pay

## 2020-03-23 NOTE — Telephone Encounter (Signed)
Nurse with [facility] did a wellness screen on patient and wanted to report A1C 7.7.  Patient is not currently being treated for diabetes.

## 2020-03-28 ENCOUNTER — Encounter: Payer: Self-pay | Admitting: Family Medicine

## 2020-03-28 ENCOUNTER — Other Ambulatory Visit: Payer: Self-pay

## 2020-03-28 ENCOUNTER — Ambulatory Visit (INDEPENDENT_AMBULATORY_CARE_PROVIDER_SITE_OTHER): Payer: Medicare Other | Admitting: Family Medicine

## 2020-03-28 VITALS — BP 144/85 | HR 82 | Ht 67.0 in | Wt 253.0 lb

## 2020-03-28 DIAGNOSIS — Z09 Encounter for follow-up examination after completed treatment for conditions other than malignant neoplasm: Secondary | ICD-10-CM | POA: Diagnosis not present

## 2020-03-28 DIAGNOSIS — G8929 Other chronic pain: Secondary | ICD-10-CM

## 2020-03-28 DIAGNOSIS — F419 Anxiety disorder, unspecified: Secondary | ICD-10-CM

## 2020-03-28 DIAGNOSIS — M545 Low back pain, unspecified: Secondary | ICD-10-CM | POA: Diagnosis not present

## 2020-03-28 MED ORDER — KETOROLAC TROMETHAMINE 60 MG/2ML IM SOLN
60.0000 mg | Freq: Once | INTRAMUSCULAR | Status: AC
  Administered 2020-03-28: 60 mg via INTRAMUSCULAR

## 2020-03-28 NOTE — Patient Instructions (Signed)
Chronic Back Pain When back pain lasts longer than 3 months, it is called chronic back pain. Pain may get worse at certain times (flare-ups). There are things you can do at home to manage your pain. Follow these instructions at home: Pay attention to any changes in your symptoms. Take these actions to help with your pain: Managing pain and stiffness  If told, put ice on the painful area. Your doctor may tell you to use ice for 24-48 hours after the flare-up starts. To do this: ? Put ice in a plastic bag. ? Place a towel between your skin and the bag. ? Leave the ice on for 20 minutes, 2-3 times a day.  If told, put heat on the painful area. Do this as often as told by your doctor. Use the heat source that your doctor recommends, such as a moist heat pack or a heating pad. ? Place a towel between your skin and the heat source. ? Leave the heat on for 20-30 minutes. ? Take off the heat if your skin turns bright red. This is especially important if you are unable to feel pain, heat, or cold. You may have a greater risk of getting burned.  Soak in a warm bath. This can help relieve pain.      Activity  Avoid bending and other activities that make pain worse.  When standing: ? Keep your upper back and neck straight. ? Keep your shoulders pulled back. ? Avoid slouching.  When sitting: ? Keep your back straight. ? Relax your shoulders. Do not round your shoulders or pull them backward.  Do not sit or stand in one place for long periods of time.  Take short rest breaks during the day. Lying down or standing is usually better than sitting. Resting can help relieve pain.  When sitting or lying down for a long time, do some mild activity or stretching. This will help to prevent stiffness and pain.  Get regular exercise. Ask your doctor what activities are safe for you.  Do not lift anything that is heavier than 10 lb (4.5 kg) or the limit that you are told, until your doctor says that it  is safe.  To prevent injury when you lift things: ? Bend your knees. ? Keep the weight close to your body. ? Avoid twisting.  Sleep on a firm mattress. Try lying on your side with your knees slightly bent. If you lie on your back, put a pillow under your knees.   Medicines  Treatment may include medicines for pain and swelling taken by mouth or put on the skin, prescription pain medicine, or muscle relaxants.  Take over-the-counter and prescription medicines only as told by your doctor.  Ask your doctor if the medicine prescribed to you: ? Requires you to avoid driving or using machinery. ? Can cause trouble pooping (constipation). You may need to take these actions to prevent or treat trouble pooping:  Drink enough fluid to keep your pee (urine) pale yellow.  Take over-the-counter or prescription medicines.  Eat foods that are high in fiber. These include beans, whole grains, and fresh fruits and vegetables.  Limit foods that are high in fat and sugars. These include fried or sweet foods. General instructions  Do not use any products that contain nicotine or tobacco, such as cigarettes, e-cigarettes, and chewing tobacco. If you need help quitting, ask your doctor.  Keep all follow-up visits as told by your doctor. This is important. Contact a doctor if:    Your pain does not get better with rest or medicine.  Your pain gets worse, or you have new pain.  You have a high fever.  You lose weight very quickly.  You have trouble doing your normal activities. Get help right away if:  One or both of your legs or feet feel weak.  One or both of your legs or feet lose feeling (have numbness).  You have trouble controlling when you poop (have a bowel movement) or pee (urinate).  You have bad back pain and: ? You feel like you may vomit (nauseous), or you vomit. ? You have pain in your belly (abdomen). ? You have shortness of breath. ? You faint. Summary  When back pain  lasts longer than 3 months, it is called chronic back pain.  Pain may get worse at certain times (flare-ups).  Use ice and heat as told by your doctor. Your doctor may tell you to use ice after flare-ups. This information is not intended to replace advice given to you by your health care provider. Make sure you discuss any questions you have with your health care provider. Document Revised: 03/10/2019 Document Reviewed: 03/10/2019 Elsevier Patient Education  2021 [place]. Ketorolac Injection What is this medicine? KETOROLAC (kee toe ROLE ak) is a non-steroidal anti-inflammatory drug, also known as an NSAID. It treats pain, inflammation, and swelling. This medicine may be used for other purposes; ask your health care provider or pharmacist if you have questions. COMMON BRAND NAME(S): Toradol What should I tell my health care provider before I take this medicine? They need to know if you have any of these conditions:  bleeding disorder  coronary artery bypass graft (CABG) within the past 2 weeks  heart attack  heart disease  heart failure  high blood pressure  if you often drink alcohol  kidney disease  liver disease  lung or breathing disease (asthma)  receiving steroids like dexamethasone or prednisone  smoke tobacco cigarettes  stomach bleeding  stomach ulcers, other stomach or intestine problems  take medicine to treat or prevent blood clots  an unusual or allergic reaction to ketorolac, other medicines, foods, dyes, or preservatives  pregnant or trying to get pregnant  breast-feeding How should I use this medicine? This medicine is injected into a vein or muscle. It is given by a health care provider in a hospital or clinic setting. A special MedGuide will be given to you before each treatment. Be sure to read this information carefully each time. Talk to your health care provider about the use of this medicine in children. Special care may be  needed. Patients over 80 years of age may have a stronger reaction and need a smaller dose. Overdosage: If you think you have taken too much of this medicine contact a poison control center or emergency room at once. NOTE: This medicine is only for you. Do not share this medicine with others. What if I miss a dose? This does not apply. This medicine is not for regular use. What may interact with this medicine? Do not take this medicine with any of the following medications:  aspirin and aspirin-like medicines  cidofovir  methotrexate  NSAIDs, medicines for pain and inflammation, like ibuprofen or naproxen  pentoxifylline  probenecid This medicine may also interact with the following medications:  alcohol  alendronate  alprazolam  carbamazepine  diuretics  flavocoxid  fluoxetine  ginkgo  lithium  medicines for blood pressure like enalapril  medicines that affect platelets like pentoxifylline  medicines that treat or prevent blood clots like heparin, warfarin  muscle relaxants  pemetrexed  phenytoin  thiothixene This list may not describe all possible interactions. Give your health care provider a list of all the medicines, herbs, non-prescription drugs, or dietary supplements you use. Also tell them if you smoke, drink alcohol, or use illegal drugs. Some items may interact with your medicine. What should I watch for while using this medicine? Your condition will be monitored carefully while you are receiving this medicine. Do not use this medicine for more than 5 days. It is only used for short-term treatment of moderate to severe pain. The risk of side effects such as kidney damage and stomach bleeding are higher if used for more than 5 days. Do not take other medicines that contain aspirin, ibuprofen, or naproxen with this medicine. Side effects such as stomach upset, nausea, or ulcers may be more likely to occur. Many non-prescription medicines contain  aspirin, ibuprofen, or naproxen. Always read labels carefully. This medicine can cause serious ulcers and bleeding in the stomach. It can happen with no warning. Smoking, drinking alcohol, older age, and poor health can also increase risks. Call your health care provider right away if you have stomach pain or blood in your vomit or stool. Alcohol may interfere with the effect of this medicine. Avoid alcoholic drinks. This medicine may cause serious skin reactions. They can happen weeks to months after starting the medicine. Contact your health care provider right away if you notice fevers or flu-like symptoms with a rash. The rash may be red or purple and then turn into blisters or peeling of the skin. Or, you might notice a red rash with swelling of the face, lips or lymph nodes in your neck or under your arms. Talk to your health care provider if you are pregnant before taking this medicine. Taking this medicine between weeks 20 and 30 of pregnancy may harm your unborn baby. Your health care provider will monitor you closely if you need to take it. After 30 weeks of pregnancy, do not take this medicine. This medicine does not prevent a heart attack or stroke. This medicine may increase the chance of a heart attack or stroke. The chance may increase the longer you use this medicine or if you have heart disease. If you take aspirin to prevent a heart attack or stroke, talk to your health care provider about using this medicine. You may get drowsy or dizzy. Do not drive, use machinery, or do anything that needs mental alertness until you know how this medicine affects you. Do not stand up or sit up quickly, especially if you are an older patient. This reduces the risk of dizzy or fainting spells. What side effects may I notice from receiving this medicine? Side effects that you should report to your doctor or health care provider as soon as possible:  allergic reactions (skin rash, itching or hives;  swelling of the face, lips, or tongue)  bleeding (bloody or black, tarry stools; red or dark brown urine; spitting up blood or brown material that looks like coffee grounds; red spots on the skin; unusual bruising or bleeding from the eyes, gums, or nose)  heart attack (trouble breathing; pain or tightness in the chest, neck, back or arms; unusually weak or tired)  heart failure (trouble breathing; fast, irregular heartbeat; sudden weight gain; swelling of the ankles, feet, hands; unusually weak or tired)  kidney injury (trouble passing urine or change in the amount of  urine)  liver injury (dark yellow or brown urine; general ill feeling or flu-like symptoms; loss of appetite, right upper belly pain; unusually weak or tired, yellowing of the eyes or skin)  low red blood cell counts (trouble breathing; feeling faint; lightheaded, falls; unusually weak or tired)  rash, fever, and swollen lymph nodes  redness, blistering, peeling, or loosening of the skin, including inside the mouth  stroke (changes in vision; confusion; trouble speaking or understanding; severe headaches; sudden numbness or weakness of the face, arm or leg; trouble walking; dizziness; loss of balance or coordination) Side effects that usually do not require medical attention (report to your doctor or health care professional if they continue or are bothersome):  constipation  decreased hearing, ringing in the ears  diarrhea  headache  nausea  pain, redness, or irritation at site where injected  passing gas  stomach pain  upset stomach This list may not describe all possible side effects. Call your doctor for medical advice about side effects. You may report side effects to FDA at 1-800-FDA-1088. Where should I keep my medicine? This medicine is given in a hospital or clinic. It will not be stored at home. NOTE: This sheet is a summary. It may not cover all possible information. If you have questions about this  medicine, talk to your doctor, pharmacist, or health care provider.  2021 Elsevier/Gold Standard (2019-06-24 15:02:26)

## 2020-03-28 NOTE — Progress Notes (Signed)
Patient [facility]  Sick Visit  Subjective:  Patient ID: Crystal Bullock, female    DOB: 07-03-1965  Age: 55 y.o. MRN: [id number]  CC:  Chief Complaint  Patient presents with  . Abnormal Lab    HPI Crystal Bullock is a 55 year old female who presents for Sick Visit today.   Patient Active Problem List   Diagnosis Date Noted  . Neck pain, chronic 06/09/2019  . Tearfulness 02/22/2019  . Prediabetes 02/22/2019  . Hyperglycemia 02/22/2019  . Hemoglobin A1c less than 7.0% 02/22/2019  . Yeast infection 11/11/2018  . GERD (gastroesophageal reflux disease) 08/19/2018  . Mild tetrahydrocannabinol (THC) abuse 08/19/2018  . Hidradenitis suppurativa 06/05/2018  . Allergic rhinitis 05/08/2018  . OSA (obstructive sleep apnea) 05/13/2017  . Low back pain 02/24/2017  . Sarcoidosis 10/06/2015  . Essential hypertension 10/06/2015  . Asthma 10/06/2015  . Atypical chest pain 10/06/2015  . Anxiety 10/06/2015  . Palpitations 10/06/2015  . RBBB 10/06/2015   Current Status: Since her last office visit, she has c/o chronic back pain. She rates her pain level 10/10 today. She continues to follow up with Pain Management as needed. She has has a next appointment next week. She denies fevers, chills, fatigue, recent infections, weight loss, and night sweats. She has not had any headaches, visual changes, dizziness, and falls. No chest pain, heart palpitations, cough and shortness of breath reported. Denies GI problems such as nausea, vomiting, diarrhea, and constipation. She has no reports of blood in stools, dysuria and hematuria. No depression or anxiety, and denies suicidal ideations, homicidal ideations, or auditory hallucinations. She is taking all medications as prescribed. She denies pain today.    Past Medical History:  Diagnosis Date  . Anxiety 10/06/2015  . Asthma   . Atypical chest pain 10/06/2015  . Chronic neck pain   . Fibroid   .  Hypertension   . Palpitations 10/06/2015  . RBBB 10/06/2015  . Sarcoidosis   . Scoliosis   . Seasonal allergies   . Sickle cell trait ([place])   . Vitamin D deficiency 05/2019    Past Surgical History:  Procedure Laterality Date  . AXILLARY LYMPH NODE DISSECTION    . CHOLECYSTECTOMY      Family History  Problem Relation Age of Onset  . Hypertension Mother   . Diabetes Mother     Social History   Socioeconomic History  . Marital status: Single    Spouse name: Not on file  . Number of children: Not on file  . Years of education: Not on file  . Highest education level: Not on file  Occupational History  . Not on file  Tobacco Use  . Smoking status: Never Smoker  . Smokeless tobacco: Never Used  Vaping Use  . Vaping Use: Never used  Substance and Sexual Activity  . Alcohol use: Yes    Comment: occasional   . Drug use: Yes    Types: Marijuana    Comment: once a week  . Sexual activity: Yes    Birth control/protection: Condom  Other Topics Concern  . Not on file  Social History Narrative  . Not on file   Social Determinants of Health   Financial Resource Strain: Not on file  Food Insecurity: Not on file  Transportation Needs: Not on file  Physical Activity: Not on file  Stress: Not on file  Social Connections: Not on file  Intimate Partner Violence: Not on file  Outpatient Medications Prior to Visit  Medication Sig Dispense Refill  . albuterol (PROVENTIL) (2.5 MG/3ML) 0.083% nebulizer solution Take 3 mLs (2.5 mg total) by nebulization every 6 (six) hours as needed for wheezing or shortness of breath. 75 mL 12  . albuterol (VENTOLIN HFA) 108 (90 Base) MCG/ACT inhaler INHALE 2 PUFFS INTO THE LUNGS EVERY 6 HOURS FOR UP TO 90 DOSES AS NEEDED FOR WHEEZING 6.7 g 4  . amLODipine (NORVASC) 10 MG tablet Take 1 tablet (10 mg total) by mouth daily. (Patient not taking: Reported on 03/28/2020) 90 tablet 1  . busPIRone (BUSPAR) 7.5 MG tablet Take 1 tablet (7.5 mg total) by  mouth 3 (three) times daily. (Patient not taking: Reported on 03/28/2020) 60 tablet 2  . cetirizine (ZYRTEC) 10 MG tablet TAKE 1 TABLET(10 MG) BY MOUTH DAILY AS NEEDED FOR ALLERGIES (Patient not taking: Reported on 03/28/2020) 30 tablet 2  . esomeprazole (NEXIUM) 40 MG capsule TAKE 1 CAPSULE(40 MG) BY MOUTH DAILY (Patient not taking: Reported on 03/28/2020) 30 capsule 3  . hydrOXYzine (VISTARIL) 50 MG capsule Take as prescribed as AVS. (Patient not taking: Reported on 03/28/2020) 120 capsule 1  . Multiple Vitamins-Minerals (MULTIVITAMINS THER. W/MINERALS) TABS Take 1 tablet by mouth daily. (Patient not taking: Reported on 03/28/2020)    . nystatin ointment (MYCOSTATIN) Apply 1 application topically 2 (two) times daily. (Patient not taking: Reported on 03/28/2020) 30 g 0  . ondansetron (ZOFRAN ODT) 4 MG disintegrating tablet Take 1 tablet (4 mg total) by mouth every 8 (eight) hours as needed for nausea or vomiting. 20 tablet 0  . oxyCODONE-acetaminophen (PERCOCET) 10-325 MG tablet Take 1 tablet by mouth every 8 (eight) hours as needed for pain.    . Vitamin D, Ergocalciferol, (DRISDOL) 1.25 MG (50000 UNIT) CAPS capsule Take 1 capsule (50,000 Units total) by mouth every 7 (seven) days. (Patient not taking: Reported on 03/28/2020) 5 capsule 6  . pregabalin (LYRICA) 50 MG capsule Take 1 capsule (50 mg total) by mouth 2 (two) times daily. 60 capsule 3   No facility-administered medications prior to visit.    Allergies  Allergen Reactions  . Ivp Dye [Iodinated Diagnostic Agents]     Hives on hands and feet per pt 08/16/15 pt had scan w/ contrast and had a 4 hr premedication protocol done    ROS Review of Systems  Constitutional: Negative.   HENT: Negative.   Eyes: Negative.   Respiratory: Negative.   Cardiovascular: Negative.   Gastrointestinal: Negative.   Endocrine: Negative.   Genitourinary: Negative.   Musculoskeletal: Positive for arthralgias (generalized joint pain).  Allergic/Immunologic:  Negative.   Neurological: Positive for dizziness (occasional ) and headaches (occasional ).  Hematological: Negative.   Psychiatric/Behavioral: Negative.       Objective:    Physical Exam Vitals and nursing note reviewed.  Constitutional:      Appearance: Normal appearance.  HENT:     Head: Normocephalic and atraumatic.     Nose: Nose normal.     Mouth/Throat:     Mouth: Mucous membranes are moist.     Pharynx: Oropharynx is clear.  Cardiovascular:     Rate and Rhythm: Normal rate and regular rhythm.     Pulses: Normal pulses.     Heart sounds: Normal heart sounds.  Pulmonary:     Effort: Pulmonary effort is normal.     Breath sounds: Normal breath sounds.  Abdominal:     General: Bowel sounds are normal. There is distension (occasional ).  Palpations: Abdomen is soft.  Musculoskeletal:        General: Normal range of motion.     Cervical back: Normal range of motion and neck supple.  Skin:    General: Skin is warm and dry.  Neurological:     General: No focal deficit present.     Mental Status: She is alert and oriented to person, place, and time.  Psychiatric:        Mood and Affect: Mood normal.        Behavior: Behavior normal.        Thought Content: Thought content normal.        Judgment: Judgment normal.     BP (!) 144/85   Pulse 82   Ht 5\' 7"  (1.702 m)   Wt 253 lb (114.8 kg)   SpO2 98%   BMI 39.63 kg/m  Wt Readings from Last 3 Encounters:  03/28/20 253 lb (114.8 kg)  03/08/20 251 lb (113.9 kg)  10/26/19 246 lb 6.4 oz (111.8 kg)     Health Maintenance Due  Topic Date Due  . Hepatitis C Screening  Never done  . TETANUS/TDAP  Never done  . COLONOSCOPY (Pts 45-29yrs Insurance coverage will need to be confirmed)  Never done  . PAP SMEAR-Modifier  10/04/2018  . MAMMOGRAM  05/13/2019    There are no preventive care reminders to display for this patient.  Lab Results  Component Value Date   TSH 0.866 06/08/2019   Lab Results  Component  Value Date   WBC 11.2 (H) 09/10/2019   HGB 11.8 (L) 09/10/2019   HCT 34.7 (L) 09/10/2019   MCV 74.6 (L) 09/10/2019   PLT 288 09/10/2019   Lab Results  Component Value Date   NA 141 09/10/2019   K 3.5 09/10/2019   CO2 27 09/10/2019   GLUCOSE 104 (H) 09/10/2019   BUN 18 09/10/2019   CREATININE 1.02 (H) 09/10/2019   BILITOT 0.2 (L) 09/10/2019   ALKPHOS 55 09/10/2019   AST 19 09/10/2019   ALT 18 09/10/2019   PROT 7.2 09/10/2019   ALBUMIN 3.5 09/10/2019   CALCIUM 8.9 09/10/2019   ANIONGAP 10 09/10/2019   No results found for: CHOL No results found for: HDL No results found for: LDLCALC No results found for: TRIG No results found for: [facility] Lab Results  Component Value Date   HGBA1C 6.7 (A) 06/08/2019    Assessment & Plan:   1. Chronic low back pain, unspecified back pain laterality, unspecified whether sciatica present - ketorolac (TORADOL) injection 60 mg  2. Anxiety  3. Follow up She will keep follow up appointment.   Meds ordered this encounter  Medications  . ketorolac (TORADOL) injection 60 mg    No orders of the defined types were placed in this encounter.   Referral Orders  No referral(s) requested today   Kathe Becton, MSN, ANE, FNP-BC [facility] [address] [place], [place] [address] [phone]- fax   Problem List Items Addressed This Visit      Other   Anxiety   Low back pain - Primary    Other Visit Diagnoses    Follow up          Meds ordered this encounter  Medications  . ketorolac (TORADOL) injection 60 mg    Follow-up: No follow-ups on file.    Azzie Glatter, FNP

## 2020-04-01 ENCOUNTER — Encounter: Payer: Self-pay | Admitting: Family Medicine

## 2020-04-05 ENCOUNTER — Encounter: Payer: Self-pay | Admitting: Family Medicine

## 2020-04-05 ENCOUNTER — Other Ambulatory Visit: Payer: Self-pay | Admitting: Family Medicine

## 2020-04-05 DIAGNOSIS — L732 Hidradenitis suppurativa: Secondary | ICD-10-CM

## 2020-04-05 DIAGNOSIS — B349 Viral infection, unspecified: Secondary | ICD-10-CM

## 2020-04-05 MED ORDER — AMOXICILLIN-POT CLAVULANATE 875-125 MG PO TABS: 1.0000 | ORAL_TABLET | Freq: Two times a day (BID) | ORAL | 0 refills | Status: AC

## 2020-04-10 ENCOUNTER — Other Ambulatory Visit: Payer: Self-pay | Admitting: Family Medicine

## 2020-04-10 DIAGNOSIS — M545 Low back pain, unspecified: Secondary | ICD-10-CM | POA: Diagnosis not present

## 2020-04-10 DIAGNOSIS — G894 Chronic pain syndrome: Secondary | ICD-10-CM | POA: Diagnosis not present

## 2020-04-10 DIAGNOSIS — Z79899 Other long term (current) drug therapy: Secondary | ICD-10-CM | POA: Diagnosis not present

## 2020-04-10 DIAGNOSIS — E559 Vitamin D deficiency, unspecified: Secondary | ICD-10-CM

## 2020-04-10 MED ORDER — VITAMIN D (ERGOCALCIFEROL) 1.25 MG (50000 UNIT) PO CAPS: 50000.0000 [IU] | ORAL_CAPSULE | ORAL | 6 refills | Status: DC

## 2020-04-18 ENCOUNTER — Other Ambulatory Visit: Payer: Self-pay | Admitting: Family Medicine

## 2020-04-18 ENCOUNTER — Other Ambulatory Visit: Payer: Self-pay | Admitting: Nurse Practitioner

## 2020-04-18 ENCOUNTER — Encounter: Payer: Self-pay | Admitting: Family Medicine

## 2020-04-18 DIAGNOSIS — J309 Allergic rhinitis, unspecified: Secondary | ICD-10-CM

## 2020-04-18 DIAGNOSIS — J4520 Mild intermittent asthma, uncomplicated: Secondary | ICD-10-CM

## 2020-04-18 DIAGNOSIS — D869 Sarcoidosis, unspecified: Secondary | ICD-10-CM

## 2020-04-18 MED ORDER — FLUTICASONE PROPIONATE 50 MCG/ACT NA SUSP: 2.0000 | Freq: Every day | NASAL | 11 refills | Status: DC | PRN

## 2020-05-03 ENCOUNTER — Encounter: Payer: Self-pay | Admitting: Family Medicine

## 2020-05-03 ENCOUNTER — Other Ambulatory Visit: Payer: Self-pay | Admitting: Family Medicine

## 2020-05-03 ENCOUNTER — Other Ambulatory Visit: Payer: Self-pay

## 2020-05-03 DIAGNOSIS — M545 Low back pain, unspecified: Secondary | ICD-10-CM

## 2020-05-03 DIAGNOSIS — B379 Candidiasis, unspecified: Secondary | ICD-10-CM

## 2020-05-03 DIAGNOSIS — G5793 Unspecified mononeuropathy of bilateral lower limbs: Secondary | ICD-10-CM

## 2020-05-03 MED ORDER — NYSTATIN 100000 UNIT/GM EX OINT: 1.0000 "application " | TOPICAL_OINTMENT | Freq: Two times a day (BID) | CUTANEOUS | 1 refills | Status: DC

## 2020-05-08 DIAGNOSIS — M545 Low back pain, unspecified: Secondary | ICD-10-CM | POA: Diagnosis not present

## 2020-05-08 DIAGNOSIS — Z79899 Other long term (current) drug therapy: Secondary | ICD-10-CM | POA: Diagnosis not present

## 2020-05-08 DIAGNOSIS — G894 Chronic pain syndrome: Secondary | ICD-10-CM | POA: Diagnosis not present

## 2020-05-15 ENCOUNTER — Encounter: Payer: Self-pay | Admitting: Family Medicine

## 2020-05-15 ENCOUNTER — Other Ambulatory Visit: Payer: Self-pay | Admitting: Family Medicine

## 2020-05-15 DIAGNOSIS — L732 Hidradenitis suppurativa: Secondary | ICD-10-CM

## 2020-05-15 DIAGNOSIS — B349 Viral infection, unspecified: Secondary | ICD-10-CM

## 2020-05-22 ENCOUNTER — Other Ambulatory Visit: Payer: Self-pay | Admitting: Nurse Practitioner

## 2020-05-22 DIAGNOSIS — I10 Essential (primary) hypertension: Secondary | ICD-10-CM

## 2020-05-23 ENCOUNTER — Encounter: Payer: Self-pay | Admitting: Family Medicine

## 2020-05-24 ENCOUNTER — Ambulatory Visit: Payer: Self-pay | Admitting: Family Medicine

## 2020-06-02 ENCOUNTER — Other Ambulatory Visit: Payer: Self-pay | Admitting: Family Medicine

## 2020-06-02 DIAGNOSIS — B349 Viral infection, unspecified: Secondary | ICD-10-CM

## 2020-06-02 DIAGNOSIS — L732 Hidradenitis suppurativa: Secondary | ICD-10-CM

## 2020-06-06 ENCOUNTER — Encounter: Payer: Self-pay | Admitting: Family Medicine

## 2020-06-08 DIAGNOSIS — L732 Hidradenitis suppurativa: Secondary | ICD-10-CM | POA: Diagnosis not present

## 2020-06-08 DIAGNOSIS — G894 Chronic pain syndrome: Secondary | ICD-10-CM | POA: Diagnosis not present

## 2020-06-08 DIAGNOSIS — M545 Low back pain, unspecified: Secondary | ICD-10-CM | POA: Diagnosis not present

## 2020-06-08 DIAGNOSIS — R030 Elevated blood-pressure reading, without diagnosis of hypertension: Secondary | ICD-10-CM | POA: Diagnosis not present

## 2020-06-08 DIAGNOSIS — Z79899 Other long term (current) drug therapy: Secondary | ICD-10-CM | POA: Diagnosis not present

## 2020-06-12 ENCOUNTER — Other Ambulatory Visit: Payer: Self-pay

## 2020-06-12 ENCOUNTER — Encounter: Payer: Self-pay | Admitting: Nurse Practitioner

## 2020-06-12 ENCOUNTER — Telehealth (INDEPENDENT_AMBULATORY_CARE_PROVIDER_SITE_OTHER): Payer: Medicare Other | Admitting: Nurse Practitioner

## 2020-06-12 VITALS — Ht 67.5 in | Wt 250.0 lb

## 2020-06-12 DIAGNOSIS — L732 Hidradenitis suppurativa: Secondary | ICD-10-CM

## 2020-06-12 MED ORDER — AMOXICILLIN-POT CLAVULANATE 875-125 MG PO TABS
1.0000 | ORAL_TABLET | Freq: Two times a day (BID) | ORAL | 0 refills | Status: AC
Start: 2020-06-12 — End: 2020-06-22

## 2020-06-12 NOTE — Progress Notes (Signed)
   [address] [place], [place]  [address] Phone:  [phone]   Fax:  [phone] Virtual Visit via Video Note  I connected with [name] on 06/12/20 at  3:40 PM EDT by video and verified that I am speaking with the correct person using two identifiers.   I discussed the limitations, risks, security and privacy concerns of performing an evaluation and management service by telephone and the availability of in person appointments. I also discussed with the patient that there may be a patient responsible charge related to this service. The patient expressed understanding and agreed to proceed.  Patient home Provider Office  History of Present Illness:  Epidermal Cyst Patient complains of a subcutaneous nodule located over the upper arm.  Hx of surgery. This has been present for 1 week.  There has been redness, swelling and pain.  Patient does have a history of epidermal inclusion cysts.   Review of Systems  Constitutional: Positive for chills and fever.  Skin: Positive for rash.   Observations/Objective: Telephone visit  Assessment and Plan: Assessment  Primary Diagnosis & Pertinent Problem List: The encounter diagnosis was Hidradenitis suppurativa.  Visit Diagnosis: 1. Hidradenitis suppurativa  Flare up Augmentin 875 mg #20 BID     Follow Up Instructions:    I discussed the assessment and treatment plan with the patient. The patient was provided an opportunity to ask questions and all were answered. The patient agreed with the plan and demonstrated an understanding of the instructions.   The patient was advised to call back or seek an in-person evaluation if the symptoms worsen or if the condition fails to improve as anticipated.  I provided 5 minutes of video- face-to-face time during this encounter.   Vevelyn Francois, NP

## 2020-06-12 NOTE — Patient Instructions (Signed)
Hidradenitis Suppurativa Hidradenitis suppurativa is a long-term (chronic) skin disease. It is similar to a severe form of acne, but it affects areas of the body where acne would be unusual, especially areas of the body where skin rubs against skin and becomes moist. These include:  Underarms.  Groin.  Genital area.  Buttocks.  Upper thighs.  Breasts. Hidradenitis suppurativa may start out as small lumps or pimples caused by blocked sweat glands or hair follicles. Pimples may develop into deep sores that break open (rupture) and drain pus. Over time, affected areas of skin may thicken and become scarred. This condition is rare and does not spread from person to person (non-contagious). What are the causes? The exact cause of this condition is not known. It may be related to:  Female and female hormones.  An overactive disease-fighting system (immune system). The immune system may over-react to blocked hair follicles or sweat glands and cause swelling and pus-filled sores. What increases the risk? You are more likely to develop this condition if you:  Are female.  Are 11-55 years old.  Have a family history of hidradenitis suppurativa.  Have a personal history of acne.  Are overweight.  Smoke.  Take the medicine lithium. What are the signs or symptoms? The first symptoms are usually painful bumps in the skin, similar to pimples. The condition may get worse over time (progress), or it may only cause mild symptoms. If the disease progresses, symptoms may include:  Skin bumps getting bigger and growing deeper into the skin.  Bumps rupturing and draining pus.  Itchy, infected skin.  Skin getting thicker and scarred.  Tunnels under the skin (fistulas) where pus drains from a bump.  Pain during daily activities, such as pain during walking if your groin area is affected.  Emotional problems, such as stress or depression. This condition may affect your appearance and your  ability or willingness to wear certain clothes or do certain activities. How is this diagnosed? This condition is diagnosed by a health care provider who specializes in skin diseases (dermatologist). You may be diagnosed based on:  Your symptoms and medical history.  A physical exam.  Testing a pus sample for infection.  Blood tests. How is this treated? Your treatment will depend on how severe your symptoms are. The same treatment will not work for everybody with this condition. You may need to try several treatments to find what works best for you. Treatment may include:  Cleaning and bandaging (dressing) your wounds as needed.  Lifestyle changes, such as new skin care routines.  Taking medicines, such as: ? Antibiotics. ? Acne medicines. ? Medicines to reduce the activity of the immune system. ? A diabetes medicine (metformin). ? Birth control pills, for women. ? Steroids to reduce swelling and pain.  Working with a mental health care provider, if you experience emotional distress due to this condition. If you have severe symptoms that do not get better with medicine, you may need surgery. Surgery may involve:  Using a laser to clear the skin and remove hair follicles.  Opening and draining deep sores.  Removing the areas of skin that are diseased and scarred. Follow these instructions at home: Medicines  Take over-the-counter and prescription medicines only as told by your health care provider.  If you were prescribed an antibiotic medicine, take it as told by your health care provider. Do not stop taking the antibiotic even if your condition improves.   Skin care  If you have open wounds,   cover them with a clean dressing as told by your health care provider. Keep wounds clean by washing them gently with soap and water when you bathe.  Do not shave the areas where you get hidradenitis suppurativa.  Do not wear deodorant.  Wear loose-fitting clothes.  Try to avoid  getting overheated or sweaty. If you get sweaty or wet, change into clean, dry clothes as soon as you can.  To help relieve pain and itchiness, cover sore areas with a warm, clean washcloth (warm compress) for 5-10 minutes as often as needed.  If told by your health care provider, take a bleach bath twice a week: ? Fill your bathtub halfway with water. ? Pour in  cup of unscented household bleach. ? Soak in the tub for 5-10 minutes. ? Only soak from the neck down. Avoid water on your face and hair. ? Shower to rinse off the bleach from your skin. General instructions  Learn as much as you can about your disease so that you have an active role in your treatment. Work closely with your health care provider to find treatments that work for you.  If you are overweight, work with your health care provider to lose weight as recommended.  Do not use any products that contain nicotine or tobacco, such as cigarettes and e-cigarettes. If you need help quitting, ask your health care provider.  If you struggle with living with this condition, talk with your health care provider or work with a mental health care provider as recommended.  Keep all follow-up visits as told by your health care provider. This is important. Where to find more information  Hidradenitis Suppurativa Foundation, Inc.: https://www.hs-foundation.org/  American Academy of Dermatology: https://www.aad.org Contact a health care provider if you have:  A flare-up of hidradenitis suppurativa.  A fever or chills.  Trouble controlling your symptoms at home.  Trouble doing your daily activities because of your symptoms.  Trouble dealing with emotional problems related to your condition. Summary  Hidradenitis suppurativa is a long-term (chronic) skin disease. It is similar to a severe form of acne, but it affects areas of the body where acne would be unusual.  The first symptoms are usually painful bumps in the skin, similar  to pimples. The condition may only cause mild symptoms, or it may get worse over time (progress).  If you have open wounds, cover them with a clean dressing as told by your health care provider. Keep wounds clean by washing them gently with soap and water when you bathe.  Besides skin care, treatment may include medicines, laser treatment, and surgery. This information is not intended to replace advice given to you by your health care provider. Make sure you discuss any questions you have with your health care provider. Document Revised: 11/23/2019 Document Reviewed: 11/23/2019 Elsevier Patient Education  2021 [employer].  

## 2020-06-21 ENCOUNTER — Ambulatory Visit: Payer: Self-pay | Admitting: Nurse Practitioner

## 2020-07-03 ENCOUNTER — Other Ambulatory Visit: Payer: Self-pay | Admitting: Nurse Practitioner

## 2020-07-03 DIAGNOSIS — J4520 Mild intermittent asthma, uncomplicated: Secondary | ICD-10-CM

## 2020-07-03 DIAGNOSIS — D869 Sarcoidosis, unspecified: Secondary | ICD-10-CM

## 2020-07-03 MED ORDER — ALBUTEROL SULFATE HFA 108 (90 BASE) MCG/ACT IN AERS: INHALATION_SPRAY | RESPIRATORY_TRACT | 4 refills | Status: DC

## 2020-07-03 MED ORDER — ALBUTEROL SULFATE (2.5 MG/3ML) 0.083% IN NEBU: INHALATION_SOLUTION | RESPIRATORY_TRACT | 1 refills | Status: DC

## 2020-07-07 ENCOUNTER — Telehealth: Payer: Self-pay | Admitting: Nurse Practitioner

## 2020-07-07 NOTE — Telephone Encounter (Signed)
VM was full could not leave a message Pt was called to schedule her AWV

## 2020-07-17 ENCOUNTER — Other Ambulatory Visit: Payer: Self-pay | Admitting: Nurse Practitioner

## 2020-07-17 DIAGNOSIS — Z1231 Encounter for screening mammogram for malignant neoplasm of breast: Secondary | ICD-10-CM

## 2020-07-27 ENCOUNTER — Ambulatory Visit (INDEPENDENT_AMBULATORY_CARE_PROVIDER_SITE_OTHER): Payer: Medicare Other | Admitting: Nurse Practitioner

## 2020-07-27 VITALS — HR 84 | Temp 97.6°F | Resp 18 | Ht 67.0 in | Wt 249.0 lb

## 2020-07-27 DIAGNOSIS — Z0000 Encounter for general adult medical examination without abnormal findings: Secondary | ICD-10-CM | POA: Diagnosis not present

## 2020-07-27 NOTE — Patient Instructions (Addendum)
Crystal Bullock , Thank you for taking time to come for your Medicare Wellness Visit. I appreciate your ongoing commitment to your health goals. Please review the following plan we discussed and let me know if I can assist you in the future.   These are the goals we discussed:  Goals   None     This is a list of the screening recommended for you and due dates:  Health Maintenance  Topic Date Due  . COVID-19 Vaccine (1) Never done  . Pneumococcal Vaccination (1 - PCV) Never done  . Hepatitis C Screening: USPSTF Recommendation to screen - Ages 30-79 yo.  Never done  . Tetanus Vaccine  Never done  . Zoster (Shingles) Vaccine (1 of 2) Never done  . Colon Cancer Screening  Never done  . Pap Smear  10/04/2018  . Mammogram  05/13/2019  . HIV Screening  Completed  . HPV Vaccine  Aged Out    Fall Prevention in the Home, Adult Falls can cause injuries and can happen to people of all ages. There are many things you can do to make your home safe and to help prevent falls. Ask forhelp when making these changes. What actions can I take to prevent falls? General Instructions Use good lighting in all rooms. Replace any light bulbs that burn out. Turn on the lights in dark areas. Use night-lights. Keep items that you use often in easy-to-reach places. Lower the shelves around your home if needed. Set up your furniture so you have a clear path. Avoid moving your furniture around. Do not have throw rugs or other things on the floor that can make you trip. Avoid walking on wet floors. If any of your floors are uneven, fix them. Add color or contrast paint or tape to clearly mark and help you see: Grab bars or handrails. First and last steps of staircases. Where the edge of each step is. If you use a stepladder: Make sure that it is fully opened. Do not climb a closed stepladder. Make sure the sides of the stepladder are locked in place. Ask someone to hold the stepladder while you use it. Know  where your pets are when moving through your home. What can I do in the bathroom?     Keep the floor dry. Clean up any water on the floor right away. Remove soap buildup in the tub or shower. Use nonskid mats or decals on the floor of the tub or shower. Attach bath mats securely with double-sided, nonslip rug tape. If you need to sit down in the shower, use a plastic, nonslip stool. Install grab bars by the toilet and in the tub and shower. Do not use towel bars as grab bars. What can I do in the bedroom? Make sure that you have a light by your bed that is easy to reach. Do not use any sheets or blankets for your bed that hang to the floor. Have a firm chair with side arms that you can use for support when you get dressed. What can I do in the kitchen? Clean up any spills right away. If you need to reach something above you, use a step stool with a grab bar. Keep electrical cords out of the way. Do not use floor polish or wax that makes floors slippery. What can I do with my stairs? Do not leave any items on the stairs. Make sure that you have a light switch at the top and the bottom of the  stairs. Make sure that there are handrails on both sides of the stairs. Fix handrails that are broken or loose. Install nonslip stair treads on all your stairs. Avoid having throw rugs at the top or bottom of the stairs. Choose a carpet that does not hide the edge of the steps on the stairs. Check carpeting to make sure that it is firmly attached to the stairs. Fix carpet that is loose or worn. What can I do on the outside of my home? Use bright outdoor lighting. Fix the edges of walkways and driveways and fix any cracks. Remove anything that might make you trip as you walk through a door, such as a raised step or threshold. Trim any bushes or trees on paths to your home. Check to see if handrails are loose or broken and that both sides of all steps have handrails. Install guardrails along the  edges of any raised decks and porches. Clear paths of anything that can make you trip, such as tools or rocks. Have leaves, snow, or ice cleared regularly. Use sand or salt on paths during winter. Clean up any spills in your garage right away. This includes grease or oil spills. What other actions can I take? Wear shoes that: Have a low heel. Do not wear high heels. Have rubber bottoms. Feel good on your feet and fit well. Are closed at the toe. Do not wear open-toe sandals. Use tools that help you move around if needed. These include: Canes. Walkers. Scooters. Crutches. Review your medicines with your doctor. Some medicines can make you feel dizzy. This can increase your chance of falling. Ask your doctor what else you can do to help prevent falls. Where to find more information Centers for Disease Control and Prevention, [employer]: http://www.wolf.info/ National Institute on Aging: http://kim-miller.com/ Contact a doctor if: You are afraid of falling at home. You feel weak, drowsy, or dizzy at home. You fall at home. Summary There are many simple things that you can do to make your home safe and to help prevent falls. Ways to make your home safe include removing things that can make you trip and installing grab bars in the bathroom. Ask for help when making these changes in your home. This information is not intended to replace advice given to you by your health care provider. Make sure you discuss any questions you have with your healthcare provider. Document Revised: 09/01/2019 Document Reviewed: 09/01/2019 Elsevier Patient Education  [place] Maintenance, Female Adopting a healthy lifestyle and getting preventive care are important in promoting health and wellness. Ask your health care provider about: The right schedule for you to have regular tests and exams. Things you can do on your own to prevent diseases and keep yourself healthy. What should I know about diet, weight,  and exercise? Eat a healthy diet  Eat a diet that includes plenty of vegetables, fruits, low-fat dairy products, and lean protein. Do not eat a lot of foods that are high in solid fats, added sugars, or sodium.  Maintain a healthy weight Body mass index (BMI) is used to identify weight problems. It estimates body fat based on height and weight. Your health care provider can help determineyour BMI and help you achieve or maintain a healthy weight. Get regular exercise Get regular exercise. This is one of the most important things you can do for your health. Most adults should: Exercise for at least 150 minutes each week. The exercise should increase your heart rate and  make you sweat (moderate-intensity exercise). Do strengthening exercises at least twice a week. This is in addition to the moderate-intensity exercise. Spend less time sitting. Even light physical activity can be beneficial. Watch cholesterol and blood lipids Have your blood tested for lipids and cholesterol at 53 years of age, then havethis test every 5 years. Have your cholesterol levels checked more often if: Your lipid or cholesterol levels are high. You are older than 54 years of age. You are at high risk for heart disease. What should I know about cancer screening? Depending on your health history and family history, you may need to have cancer screening at various ages. This may include screening for: Breast cancer. Cervical cancer. Colorectal cancer. Skin cancer. Lung cancer. What should I know about heart disease, diabetes, and high blood pressure? Blood pressure and heart disease High blood pressure causes heart disease and increases the risk of stroke. This is more likely to develop in people who have high blood pressure readings, are of African descent, or are overweight. Have your blood pressure checked: Every 3-5 years if you are 62-51 years of age. Every year if you are 29 years old or  older. Diabetes Have regular diabetes screenings. This checks your fasting blood sugar level. Have the screening done: Once every three years after age 22 if you are at a normal weight and have a low risk for diabetes. More often and at a younger age if you are overweight or have a high risk for diabetes. What should I know about preventing infection? Hepatitis B If you have a higher risk for hepatitis B, you should be screened for this virus. Talk with your health care provider to find out if you are at risk forhepatitis B infection. Hepatitis C Testing is recommended for: Everyone born from [birth year] through 1963. Anyone with known risk factors for hepatitis C. Sexually transmitted infections (STIs) Get screened for STIs, including gonorrhea and chlamydia, if: You are sexually active and are younger than 53 years of age. You are older than 53 years of age and your health care provider tells you that you are at risk for this type of infection. Your sexual activity has changed since you were last screened, and you are at increased risk for chlamydia or gonorrhea. Ask your health care provider if you are at risk. Ask your health care provider about whether you are at high risk for HIV. Your health care provider may recommend a prescription medicine to help prevent HIV infection. If you choose to take medicine to prevent HIV, you should first get tested for HIV. You should then be tested every 3 months for as long as you are taking the medicine. Pregnancy If you are about to stop having your period (premenopausal) and you may become pregnant, seek counseling before you get pregnant. Take 400 to 800 micrograms (mcg) of folic acid every day if you become pregnant. Ask for birth control (contraception) if you want to prevent pregnancy. Osteoporosis and menopause Osteoporosis is a disease in which the bones lose minerals and strength with aging. This can result in bone fractures. If you are 32 years old  or older, or if you are at risk for osteoporosis and fractures, ask your health care provider if you should: Be screened for bone loss. Take a calcium or vitamin D supplement to lower your risk of fractures. Be given hormone replacement therapy (HRT) to treat symptoms of menopause. Follow these instructions at home: Lifestyle Do not use any products that  contain nicotine or tobacco, such as cigarettes, e-cigarettes, and chewing tobacco. If you need help quitting, ask your health care provider. Do not use street drugs. Do not share needles. Ask your health care provider for help if you need support or information about quitting drugs. Alcohol use Do not drink alcohol if: Your health care provider tells you not to drink. You are pregnant, may be pregnant, or are planning to become pregnant. If you drink alcohol: Limit how much you use to 0-1 drink a day. Limit intake if you are breastfeeding. Be aware of how much alcohol is in your drink. In the [place] one drink equals one 12 oz bottle of beer (355 mL), one 5 oz glass of wine (148 mL), or one 1 oz glass of hard liquor (44 mL). General instructions Schedule regular health, dental, and eye exams. Stay current with your vaccines. Tell your health care provider if: You often feel depressed. You have ever been abused or do not feel safe at home. Summary Adopting a healthy lifestyle and getting preventive care are important in promoting health and wellness. Follow your health care provider's instructions about healthy diet, exercising, and getting tested or screened for diseases. Follow your health care provider's instructions on monitoring your cholesterol and blood pressure. This information is not intended to replace advice given to you by your health care provider. Make sure you discuss any questions you have with your healthcare provider. Document Revised: 01/21/2018 Document Reviewed: 01/21/2018 Elsevier Patient Education  2022 [place].  Steps to Quit Smoking Smoking tobacco is the leading cause of preventable death. It can affect almost every organ in the body. Smoking puts you and those around you at risk for developing many serious chronic diseases. Quitting smoking can be difficult, but it is one of the best things that you can do for your health. It is nevertoo late to quit. How do I get ready to quit? When you decide to quit smoking, create a plan to help you succeed. Before you quit: Pick a date to quit. Set a date within the next 2 weeks to give you time to prepare. Write down the reasons why you are quitting. Keep this list in places where you will see it often. Tell your family, friends, and co-workers that you are quitting. Support from your loved ones can make quitting easier. Talk with your health care provider about your options for quitting smoking. Find out what treatment options are covered by your health insurance. Identify people, places, things, and activities that make you want to smoke (triggers). Avoid them. What first steps can I take to quit smoking? Throw away all cigarettes at home, at work, and in your car. Throw away smoking accessories, such as Scientist, research (medical). Clean your car. Make sure to empty the ashtray. Clean your home, including curtains and carpets. What strategies can I use to quit smoking? Talk with your health care provider about combining strategies, such as taking medicines while you are also receiving in-person counseling. Using these two strategies together makes you more likely to succeed in quitting than if you used either strategy on its own. If you are pregnant or breastfeeding, talk with your health care provider about finding counseling or other support strategies to quit smoking. Do not take medicine to help you quit smoking unless your health care provider tells you to do so. To quit smoking: Quit right away Quit smoking completely, instead of gradually reducing  how much you smoke over a period  of time. Research shows that stopping smoking right away is more successful than gradually quitting. Attend in-person counseling to help you build problem-solving skills. You are more likely to succeed in quitting if you attend counseling sessions regularly. Even short sessions of 10 minutes can be effective. Take medicine You may take medicines to help you quit smoking. Some medicines require a prescription and some you can purchase over-the-counter. Medicines may have nicotine in them to replace the nicotine in cigarettes. Medicines may: Help to stop cravings. Help to relieve withdrawal symptoms. Your health care provider may recommend: Nicotine patches, gum, or lozenges. Nicotine inhalers or sprays. Non-nicotine medicine that is taken by mouth. Find resources Find resources and support systems that can help you to quit smoking and remain smoke-free after you quit. These resources are most helpful when you use them often. They include: Online chats with a Social worker. Telephone quitlines. Printed Furniture conservator/restorer. Support groups or group counseling. Text messaging programs. Mobile phone apps or applications. Use apps that can help you stick to your quit plan by providing reminders, tips, and encouragement. There are many free apps for mobile devices as well as websites. Examples include Quit Guide from the [employer] and smokefree.gov What things can I do to make it easier to quit?  Reach out to your family and friends for support and encouragement. Call telephone quitlines (1-800-QUIT-NOW), reach out to support groups, or work with a counselor for support. Ask people who smoke to avoid smoking around you. Avoid places that trigger you to smoke, such as bars, parties, or smoke-break areas at work. Spend time with people who do not smoke. Lessen the stress in your life. Stress can be a smoking trigger for some people. To lessen stress, try: Exercising  regularly. Doing deep-breathing exercises. Doing yoga. Meditating. Performing a body scan. This involves closing your eyes, scanning your body from head to toe, and noticing which parts of your body are particularly tense. Try to relax the muscles in those areas. How will I feel when I quit smoking? Day 1 to 3 weeks Within the first 24 hours of quitting smoking, you may start to feel withdrawal symptoms. These symptoms are usually most noticeable 2-3 days after quitting, but they usually do not last for more than 2-3 weeks. You may experience these symptoms: Mood swings. Restlessness, anxiety, or irritability. Trouble concentrating. Dizziness. Strong cravings for sugary foods and nicotine. Mild weight gain. Constipation. Nausea. Coughing or a sore throat. Changes in how the medicines that you take for unrelated issues work in your body. Depression. Trouble sleeping (insomnia). Week 3 and afterward After the first 2-3 weeks of quitting, you may start to notice more positive results, such as: Improved sense of smell and taste. Decreased coughing and sore throat. Slower heart rate. Lower blood pressure. Clearer skin. The ability to breathe more easily. Fewer sick days. Quitting smoking can be very challenging. Do not get discouraged if you are not successful the first time. Some people need to make many attempts to quit before they achieve long-term success. Do your best to stick to your quit plan, and talk with your health care provider if you haveany questions or concerns. Summary Smoking tobacco is the leading cause of preventable death. Quitting smoking is one of the best things that you can do for your health. When you decide to quit smoking, create a plan to help you succeed. Quit smoking right away, not slowly over a period of time. When you start quitting, seek help from your  health care provider, family, or friends. This information is not intended to replace advice given to  you by your health care provider. Make sure you discuss any questions you have with your healthcare provider. Document Revised: 10/23/2018 Document Reviewed: 04/18/2018 Elsevier Patient Education  [place].

## 2020-07-27 NOTE — Progress Notes (Signed)
Subjective:   Crystal Bullock is a 55 y.o. female who presents for Medicare Annual (Subsequent) preventive examination.  Review of Systems    Review of Systems  Constitutional: Negative.   HENT: Negative.    Eyes: Negative.   Respiratory: Negative.    Cardiovascular: Negative.   Gastrointestinal: Negative.   Genitourinary: Negative.   Musculoskeletal: Negative.   Skin: Negative.   Neurological: Negative.   Endo/Heme/Allergies: Negative.   Psychiatric/Behavioral: Negative.     Cardiac Risk Factors include: hypertension;obesity (BMI >30kg/m2)     Objective:    Today's Vitals   07/27/20 1505  Pulse: 84  Resp: 18  Temp: 97.6 F (36.4 C)  SpO2: 99%  Weight: 249 lb (112.9 kg)  Height: 5\' 7"  (1.702 m)   Body mass index is 39 kg/m.  Advanced Directives 09/10/2019 07/07/2018 09/14/2017 01/13/2017 06/10/2016 11/13/2015 08/25/2015  Does Patient Have a Medical Advance Directive? No No Yes No No No No  Type of Advance Directive - Public librarian;Living will - - - -  Would patient like information on creating a medical advance directive? No - Patient declined No - Patient declined - No - Patient declined - No - patient declined information -    Current Medications (verified) Outpatient Encounter Medications as of 07/27/2020  Medication Sig   albuterol (PROVENTIL) (2.5 MG/3ML) 0.083% nebulizer solution USE 1 VIAL VIA NEBULIZER EVERY 6 HOURS AS NEEDED FOR WHEEZING OR SHORTNESS OF BREATH   albuterol (VENTOLIN HFA) 108 (90 Base) MCG/ACT inhaler INHALE 2 PUFFS INTO THE LUNGS EVERY 6 HOURS FOR UP TO 90 DOSES AS NEEDED FOR WHEEZING   amLODipine (NORVASC) 10 MG tablet TAKE 1 TABLET(10 MG) BY MOUTH DAILY   busPIRone (BUSPAR) 7.5 MG tablet Take 1 tablet (7.5 mg total) by mouth 3 (three) times daily.   cetirizine (ZYRTEC) 10 MG tablet TAKE 1 TABLET(10 MG) BY MOUTH DAILY AS NEEDED FOR ALLERGIES   esomeprazole (NEXIUM) 40 MG capsule TAKE 1 CAPSULE(40 MG) BY MOUTH DAILY    fluticasone (FLONASE) 50 MCG/ACT nasal spray Place 2 sprays into both nostrils daily as needed. allergies   hydrOXYzine (VISTARIL) 50 MG capsule Take as prescribed as AVS.   Multiple Vitamins-Minerals (MULTIVITAMINS THER. W/MINERALS) TABS Take 1 tablet by mouth daily.   nystatin ointment (MYCOSTATIN) Apply 1 application topically 2 (two) times daily.   ondansetron (ZOFRAN ODT) 4 MG disintegrating tablet Take 1 tablet (4 mg total) by mouth every 8 (eight) hours as needed for nausea or vomiting.   oxyCODONE-acetaminophen (PERCOCET) 10-325 MG tablet Take 1 tablet by mouth every 8 (eight) hours as needed for pain.   Vitamin D, Ergocalciferol, (DRISDOL) 1.25 MG (50000 UNIT) CAPS capsule Take 1 capsule (50,000 Units total) by mouth every 7 (seven) days.   No facility-administered encounter medications on file as of 07/27/2020.    Allergies (verified) Ivp dye [iodinated diagnostic agents]   History: Past Medical History:  Diagnosis Date   Anxiety 10/06/2015   Asthma    Atypical chest pain 10/06/2015   Chronic neck pain    Fibroid    Hypertension    Palpitations 10/06/2015   RBBB 10/06/2015   Sarcoidosis    Scoliosis    Seasonal allergies    Sickle cell trait ([place])    Vitamin D deficiency 05/2019   Past Surgical History:  Procedure Laterality Date   AXILLARY LYMPH NODE DISSECTION     CHOLECYSTECTOMY     Family History  Problem Relation Age of Onset   Hypertension  Mother    Diabetes Mother    Social History   Socioeconomic History   Marital status: Single    Spouse name: Not on file   Number of children: Not on file   Years of education: Not on file   Highest education level: Not on file  Occupational History   Not on file  Tobacco Use   Smoking status: Never   Smokeless tobacco: Never  Vaping Use   Vaping Use: Never used  Substance and Sexual Activity   Alcohol use: Yes    Comment: occasional    Drug use: Yes    Types: Marijuana    Comment: once a week   Sexual  activity: Yes    Birth control/protection: Condom  Other Topics Concern   Not on file  Social History Narrative   Not on file   Social Determinants of Health   Financial Resource Strain: Low Risk    Difficulty of Paying Living Expenses: Not hard at all  Food Insecurity: No Food Insecurity   Worried About Charity fundraiser in the Last Year: Never true   [place] in the Last Year: Never true  Transportation Needs: No Transportation Needs   Lack of Transportation (Medical): No   Lack of Transportation (Non-Medical): No  Physical Activity: Insufficiently Active   Days of Exercise per Week: 3 days   Minutes of Exercise per Session: 30 min  Stress: Stress Concern Present   Feeling of Stress : To some extent  Social Connections: Moderately Integrated   Frequency of Communication with Friends and Family: More than three times a week   Frequency of Social Gatherings with Friends and Family: Once a week   Attends Religious Services: More than 4 times per year   Active Member of [employer] or Organizations: Yes   Attends Music therapist: More than 4 times per year   Marital Status: Never married    Tobacco Counseling Counseling given: Yes   Clinical Intake:  Pre-visit preparation completed: No        BMI - recorded: 39 Nutritional Status: BMI > 30  Obese Nutritional Risks: None  How often do you need to have someone help you when you read instructions, pamphlets, or other written materials from your doctor or pharmacy?: 1 - Never What is the last grade level you completed in school?: 12th  Diabetic?no  Interpreter Needed?: No      Activities of Daily Living In your present state of health, do you have any difficulty performing the following activities: 07/27/2020  Hearing? N  Vision? N  Difficulty concentrating or making decisions? N  Walking or climbing stairs? Y  Dressing or bathing? N  Doing errands, shopping? N  Preparing Food and eating ? N   Using the Toilet? N  In the past six months, have you accidently leaked urine? N  Do you have problems with loss of bowel control? N  Managing your Medications? N  Managing your Finances? N  Housekeeping or managing your Housekeeping? N  Some recent data might be hidden    No care team member to display  Indicate any recent Medical Services you may have received from other than [facility] providers in the past year (date may be approximate).     Assessment:   This is a routine wellness examination for [place].  Hearing/Vision screen No results found.  Dietary issues and exercise activities discussed: Current Exercise Habits: Home exercise routine, Type of exercise: treadmill, Time (Minutes):  30, Frequency (Times/Week): 3, Weekly Exercise (Minutes/Week): 90, Intensity: Moderate, Exercise limited by: orthopedic condition(s)   Goals Addressed             This Visit's Progress    DIET - REDUCE FAT INTAKE         Depression Screen PHQ 2/9 Scores 07/27/2020 03/08/2020 10/26/2019 07/13/2019 06/08/2019 04/07/2019 11/10/2018  PHQ - 2 Score 1 0 0 0 0 1 0  PHQ- 9 Score - - - - - - -    Fall Risk Fall Risk  07/27/2020 03/08/2020 10/26/2019 07/13/2019 06/08/2019  Falls in the past year? 1 0 1 0 0  Number falls in past yr: 0 - 0 0 0  Injury with Fall? 1 - 0 - 0  Risk for fall due to : History of fall(s) - - - -  Follow up Education provided Falls evaluation completed - - -    FALL RISK PREVENTION PERTAINING TO THE HOME:  Any stairs in or around the home? Yes  If so, are there any without handrails? Yes  Home free of loose throw rugs in walkways, pet beds, electrical cords, etc? Yes  Adequate lighting in your home to reduce risk of falls? Yes   ASSISTIVE DEVICES UTILIZED TO PREVENT FALLS:  Life alert? No  Use of a cane, walker or w/c? No  Grab bars in the bathroom? No  Shower chair or bench in shower? No  Elevated toilet seat or a handicapped toilet? No   TIMED UP AND GO:  Was the test  performed? Yes .  Length of time to ambulate 10 feet: 3 sec.   Gait steady and fast without use of assistive device  Cognitive Function:        Immunizations  There is no immunization history on file for this patient.  TDAP status: Due, Education has been provided regarding the importance of this vaccine. Advised may receive this vaccine at local pharmacy or Health Dept. Aware to provide a copy of the vaccination record if obtained from local pharmacy or Health Dept. Verbalized acceptance and understanding.  Flu Vaccine status: Due, Education has been provided regarding the importance of this vaccine. Advised may receive this vaccine at local pharmacy or Health Dept. Aware to provide a copy of the vaccination record if obtained from local pharmacy or Health Dept. Verbalized acceptance and understanding.  Pneumococcal vaccine status: Due, Education has been provided regarding the importance of this vaccine. Advised may receive this vaccine at local pharmacy or Health Dept. Aware to provide a copy of the vaccination record if obtained from local pharmacy or Health Dept. Verbalized acceptance and understanding.  Covid-19 vaccine status: Declined, Education has been provided regarding the importance of this vaccine but patient still declined. Advised may receive this vaccine at local pharmacy or Health Dept.or vaccine clinic. Aware to provide a copy of the vaccination record if obtained from local pharmacy or Health Dept. Verbalized acceptance and understanding.  Qualifies for Shingles Vaccine? Yes   Zostavax completed No   Shingrix Completed?: No.    Education has been provided regarding the importance of this vaccine. Patient has been advised to call insurance company to determine out of pocket expense if they have not yet received this vaccine. Advised may also receive vaccine at local pharmacy or Health Dept. Verbalized acceptance and understanding.  Screening Tests Health Maintenance   Topic Date Due   COVID-19 Vaccine (1) Never done   Pneumococcal Vaccine 69-64 Years old (1 - PCV) Never done  Hepatitis C Screening  Never done   TETANUS/TDAP  Never done   Zoster Vaccines- Shingrix (1 of 2) Never done   COLONOSCOPY (Pts 45-24yrs Insurance coverage will need to be confirmed)  Never done   PAP SMEAR-Modifier  10/04/2018   MAMMOGRAM  05/13/2019   HIV Screening  Completed   HPV VACCINES  Aged Out    Health Maintenance  Health Maintenance Due  Topic Date Due   COVID-19 Vaccine (1) Never done   Pneumococcal Vaccine 34-62 Years old (1 - PCV) Never done   Hepatitis C Screening  Never done   TETANUS/TDAP  Never done   Zoster Vaccines- Shingrix (1 of 2) Never done   COLONOSCOPY (Pts 45-68yrs Insurance coverage will need to be confirmed)  Never done   PAP SMEAR-Modifier  10/04/2018   MAMMOGRAM  05/13/2019    Colorectal cancer screening: Type of screening: Colonoscopy. Completed 2021. Repeat every 5 years  Mammogram status: scheduled for August 1st. Pt provided with contact info and advised to call to schedule appt.   Bone Density: NA  Lung Cancer Screening: (Low Dose CT Chest recommended if Age 84-80 years, 30 pack-year currently smoking OR have quit w/in 15years.) does not qualify.   Lung Cancer Screening Referral: na  Additional Screening:  Hepatitis C Screening: does qualify; PCP to complete  Vision Screening: Recommended annual ophthalmology exams for early detection of glaucoma and other disorders of the eye. Is the patient up to date with their annual eye exam?  No  Who is the provider or what is the name of the office in which the patient attends annual eye exams? [facility] - scheduled tomorrow If pt is not established with a provider, would they like to be referred to a provider to establish care?  NA .   Dental Screening: Recommended annual dental exams for proper oral hygiene  Community Resource Referral / Chronic Care Management: CRR required  this visit?  No   CCM required this visit?  No      Plan:     I have personally reviewed and noted the following in the patient's chart:   Medical and social history Use of alcohol, tobacco or illicit drugs  Current medications and supplements including opioid prescriptions.  Functional ability and status Nutritional status Physical activity Advanced directives List of other physicians Hospitalizations, surgeries, and ER visits in previous 12 months Vitals Screenings to include cognitive, depression, and falls Referrals and appointments  In addition, I have reviewed and discussed with patient certain preventive protocols, quality metrics, and best practice recommendations. A written personalized care plan for preventive services as well as general preventive health recommendations were provided to patient.     Fenton Foy, NP   07/27/2020

## 2020-08-07 ENCOUNTER — Other Ambulatory Visit: Payer: Self-pay | Admitting: Nurse Practitioner

## 2020-08-07 ENCOUNTER — Telehealth: Payer: Self-pay

## 2020-08-07 MED ORDER — FLUCONAZOLE 150 MG PO TABS: 150.0000 mg | ORAL_TABLET | Freq: Once | ORAL | 0 refills | Status: AC

## 2020-08-07 NOTE — Telephone Encounter (Signed)
Pt asking for Yeast infection medication

## 2020-08-07 NOTE — Telephone Encounter (Signed)
Patient also sent mychart message, has been forwarded to provider. Awaiting response.

## 2020-08-07 NOTE — Progress Notes (Signed)
   [facility] [address] [place], [place]  [address] Phone:  [phone]   Fax:  [phone] 

## 2020-09-04 ENCOUNTER — Other Ambulatory Visit (HOSPITAL_COMMUNITY): Payer: Self-pay | Admitting: Nurse Practitioner

## 2020-09-04 IMAGING — US US THYROID
1 series · 14 of 25 positions shown · non-contrast
Comparison: None.

CLINICAL DATA: Neck pain

EXAM:
THYROID ULTRASOUND
TECHNIQUE: Ultrasound examination of the thyroid gland and adjacent soft
tissues was performed.

[Series 1: us thyroid · 14 of 45 slices shown]
[im 1/45]
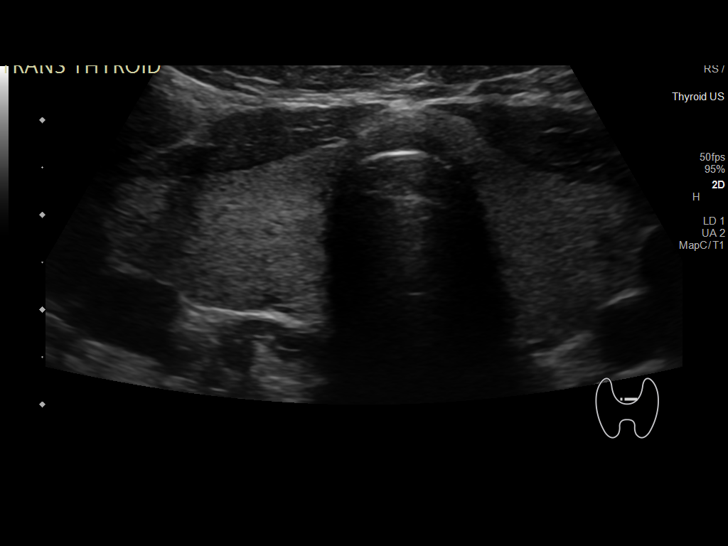
[im 4/45]
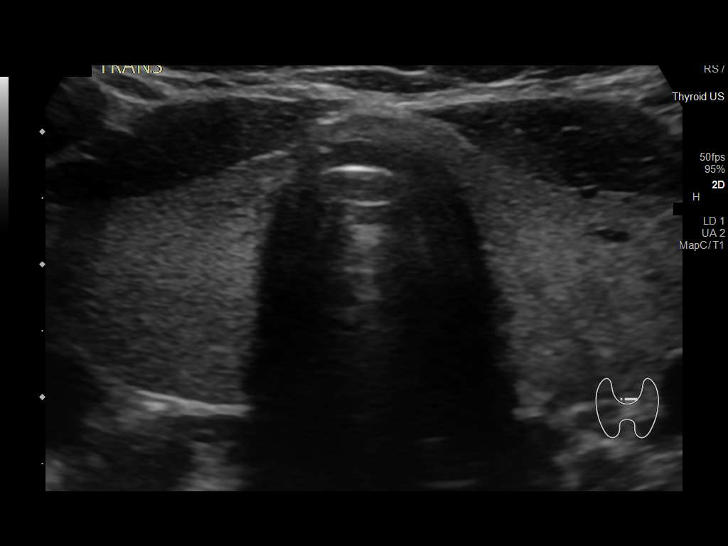
[im 8/45]
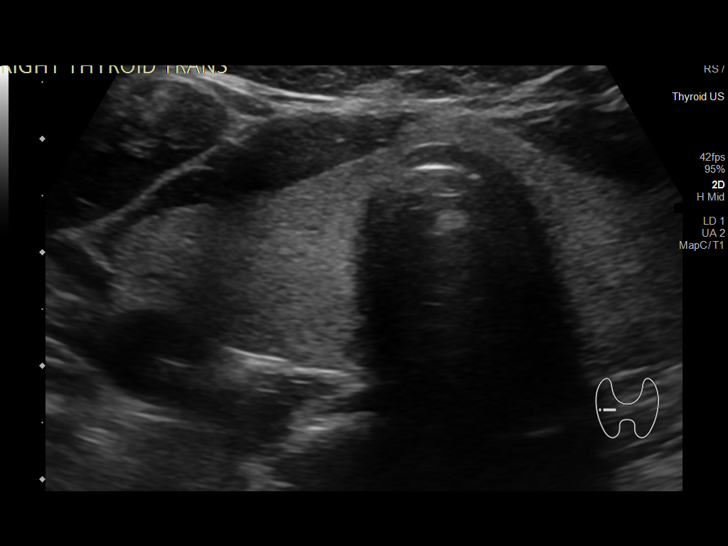
[im 12/45]
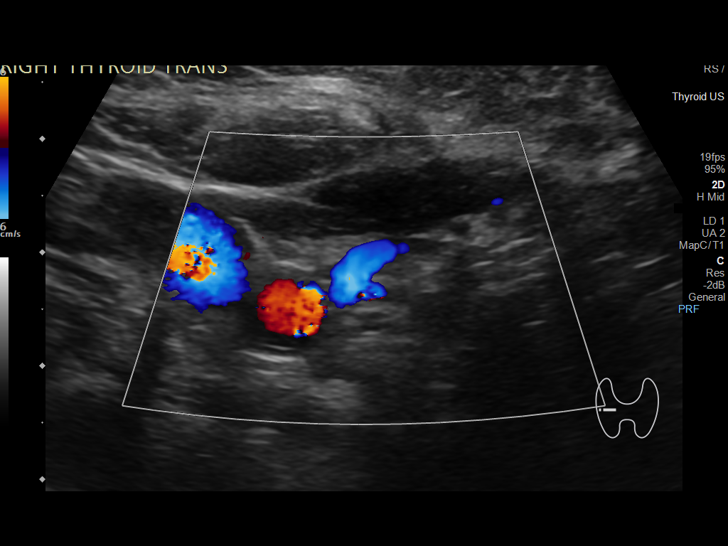
[im 15/45]
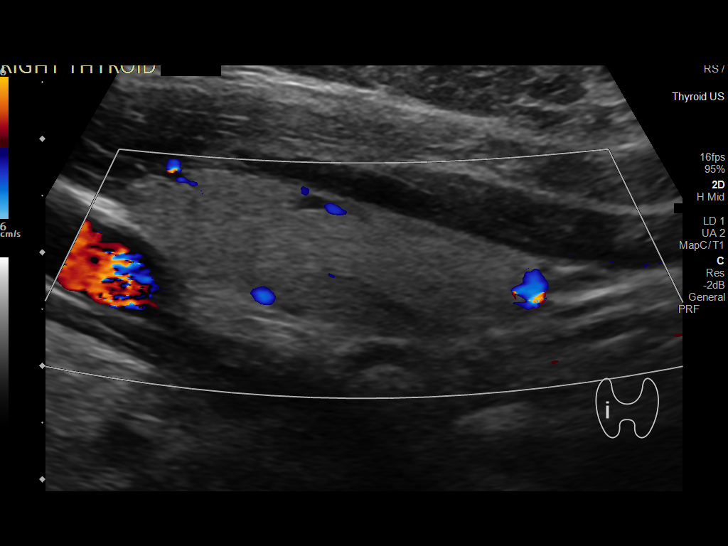
[im 17/45]
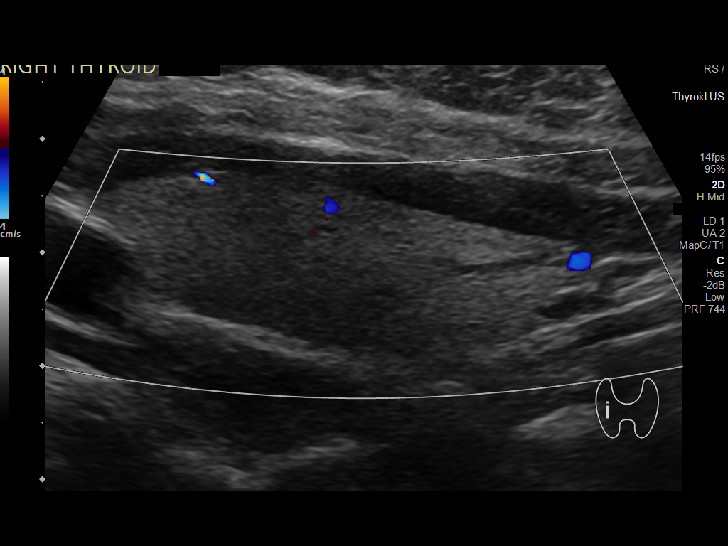
[im 21/45]
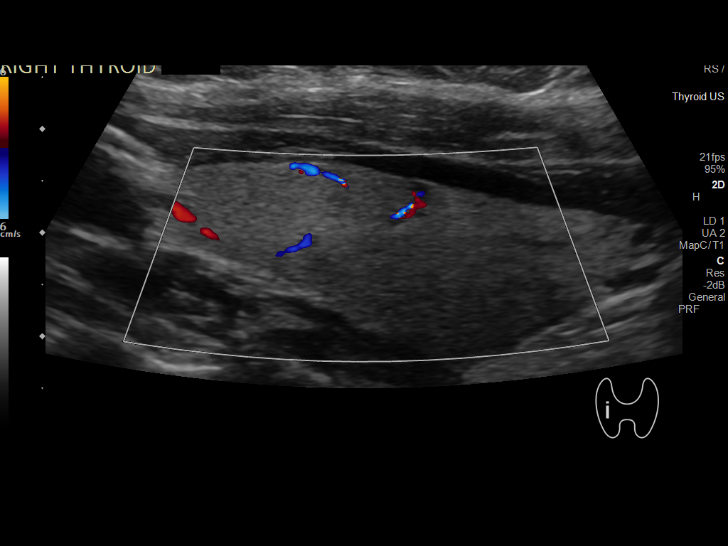
[im 24/45]
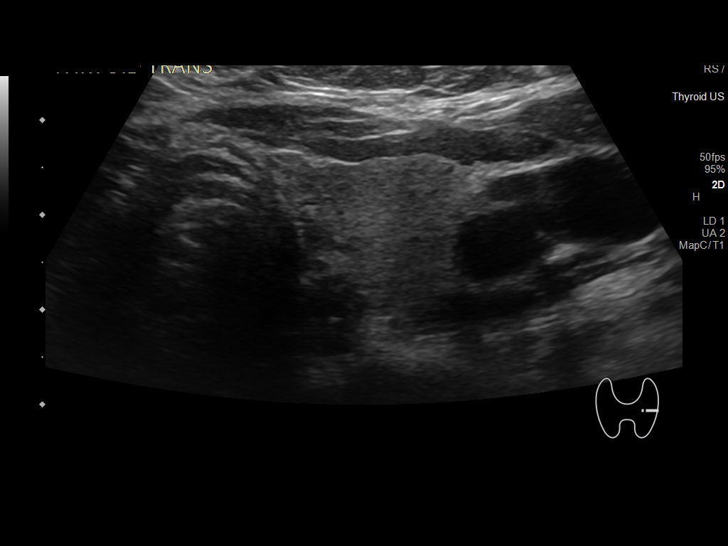
[im 28/45]
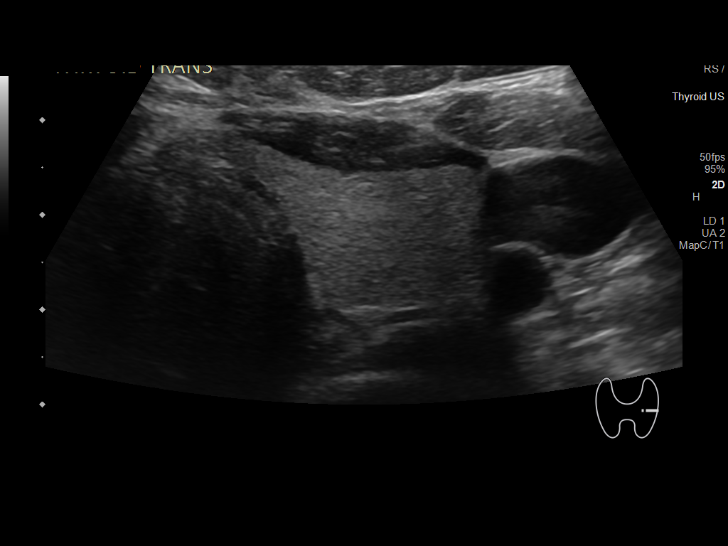
[im 30/45]
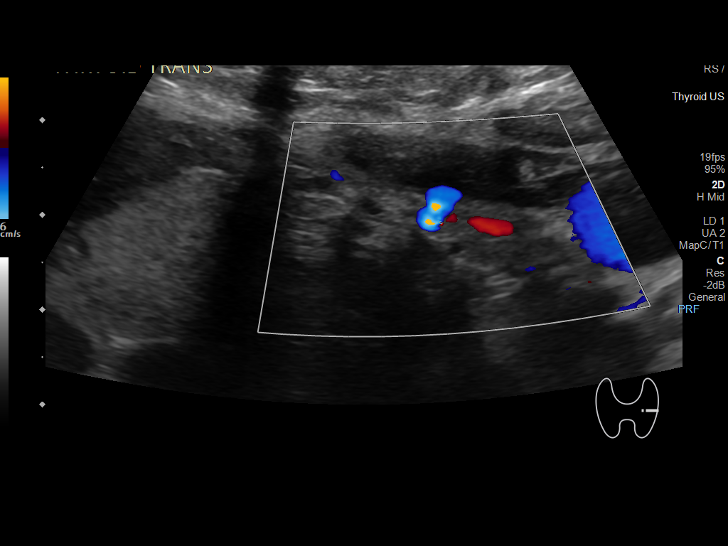
[im 34/45]
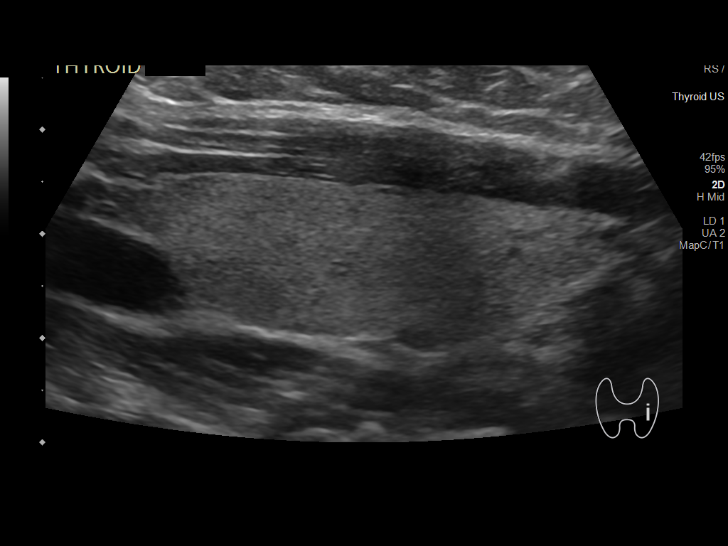
[im 37/45]
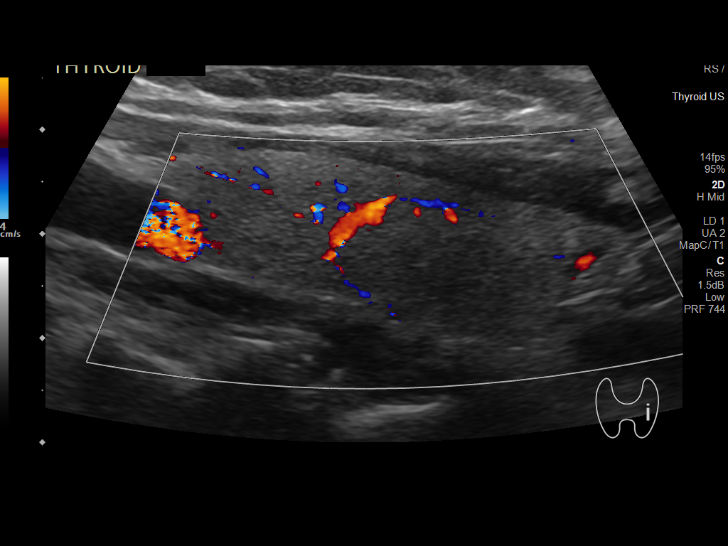
[im 41/45]
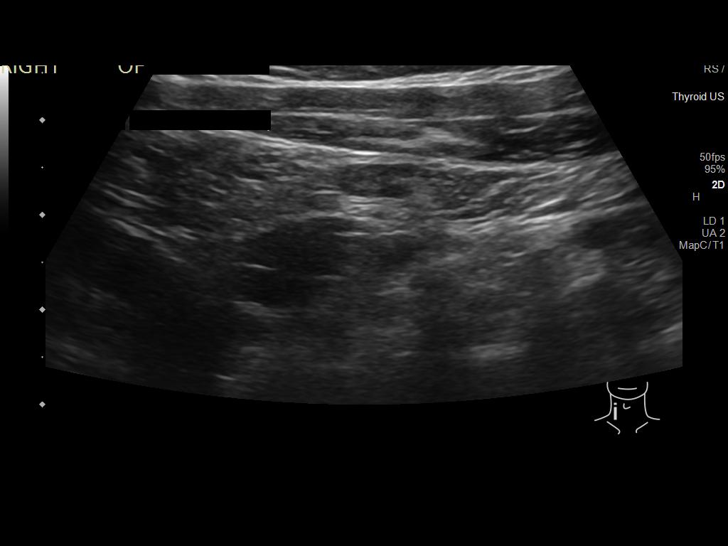
[im 45/45]
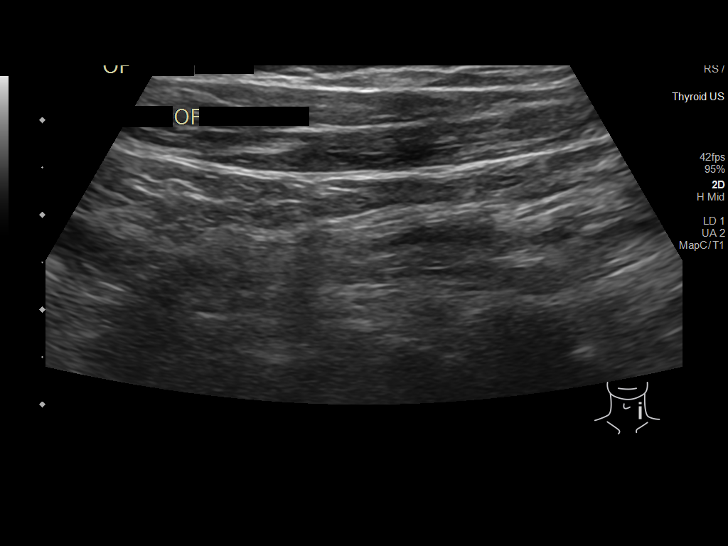

[14 of 25 positions shown; findings below may reference images not displayed]

FINDINGS: Parenchymal Echotexture: Mildly heterogenous

Isthmus: 0.3 cm

Right lobe: 5.3 x 1.6 x 2.1 cm

Left lobe: 5 x 1.4 x 2.1 cm

_________________________________________________________

Estimated total number of nodules >/= 1 cm: 0

Number of spongiform nodules >/=  2 cm not described below (TR1): 0

Number of mixed cystic and solid nodules >/= 1.5 cm not described
below (TR2): 0

_________________________________________________________

No discrete nodules are seen within the thyroid gland.
IMPRESSION: Mildly enlarged thyroid gland without evidence for distinct thyroid
nodule.

The above is in keeping with the ACR TI-RADS recommendations - [HOSPITAL] 0528;[DATE].

## 2020-09-04 MED ORDER — AMOXICILLIN-POT CLAVULANATE 875-125 MG PO TABS: 1.0000 | ORAL_TABLET | Freq: Two times a day (BID) | ORAL | 0 refills | Status: AC

## 2020-09-04 NOTE — Progress Notes (Signed)
   [facility] [address] [place],   [address] Phone:  [phone]   Fax:  [phone] 

## 2020-09-11 ENCOUNTER — Ambulatory Visit: Payer: Medicare Other

## 2020-09-22 ENCOUNTER — Other Ambulatory Visit: Payer: Self-pay | Admitting: Internal Medicine

## 2020-09-22 DIAGNOSIS — J45909 Unspecified asthma, uncomplicated: Secondary | ICD-10-CM

## 2020-10-30 ENCOUNTER — Ambulatory Visit: Payer: Medicare Other

## 2020-11-01 ENCOUNTER — Ambulatory Visit: Payer: Medicare Other

## 2020-11-27 ENCOUNTER — Other Ambulatory Visit: Payer: Self-pay | Admitting: Nurse Practitioner

## 2020-11-27 DIAGNOSIS — L732 Hidradenitis suppurativa: Secondary | ICD-10-CM

## 2020-11-27 MED ORDER — AMOXICILLIN-POT CLAVULANATE 875-125 MG PO TABS: 1.0000 | ORAL_TABLET | Freq: Two times a day (BID) | ORAL | 0 refills | Status: AC

## 2020-12-11 ENCOUNTER — Other Ambulatory Visit: Payer: Self-pay

## 2020-12-11 DIAGNOSIS — D869 Sarcoidosis, unspecified: Secondary | ICD-10-CM

## 2020-12-11 DIAGNOSIS — J4520 Mild intermittent asthma, uncomplicated: Secondary | ICD-10-CM

## 2020-12-11 MED ORDER — ALBUTEROL SULFATE HFA 108 (90 BASE) MCG/ACT IN AERS: INHALATION_SPRAY | RESPIRATORY_TRACT | 1 refills | Status: DC

## 2020-12-12 ENCOUNTER — Other Ambulatory Visit: Payer: Self-pay

## 2020-12-12 DIAGNOSIS — J4520 Mild intermittent asthma, uncomplicated: Secondary | ICD-10-CM

## 2020-12-12 DIAGNOSIS — D869 Sarcoidosis, unspecified: Secondary | ICD-10-CM

## 2020-12-12 MED ORDER — ALBUTEROL SULFATE (2.5 MG/3ML) 0.083% IN NEBU: INHALATION_SOLUTION | RESPIRATORY_TRACT | 1 refills | Status: DC

## 2020-12-15 ENCOUNTER — Encounter: Payer: Self-pay | Admitting: Nurse Practitioner

## 2020-12-15 ENCOUNTER — Telehealth: Payer: Self-pay | Admitting: Pulmonary Disease

## 2020-12-15 ENCOUNTER — Ambulatory Visit (HOSPITAL_COMMUNITY)
Admission: RE | Admit: 2020-12-15 | Discharge: 2020-12-15 | Disposition: A | Discharge status: Home / Self Care | Payer: Medicare Other | Source: Ambulatory Visit | Attending: Nurse Practitioner | Admitting: Nurse Practitioner

## 2020-12-15 ENCOUNTER — Ambulatory Visit (INDEPENDENT_AMBULATORY_CARE_PROVIDER_SITE_OTHER): Payer: Medicare Other | Admitting: Nurse Practitioner

## 2020-12-15 ENCOUNTER — Other Ambulatory Visit: Payer: Self-pay

## 2020-12-15 VITALS — BP 154/93 | HR 89 | Temp 97.3°F | Ht 67.0 in | Wt 241.0 lb

## 2020-12-15 DIAGNOSIS — B379 Candidiasis, unspecified: Secondary | ICD-10-CM

## 2020-12-15 DIAGNOSIS — R0989 Other specified symptoms and signs involving the circulatory and respiratory systems: Secondary | ICD-10-CM | POA: Insufficient documentation

## 2020-12-15 DIAGNOSIS — R7303 Prediabetes: Secondary | ICD-10-CM | POA: Diagnosis not present

## 2020-12-15 DIAGNOSIS — G4733 Obstructive sleep apnea (adult) (pediatric): Secondary | ICD-10-CM

## 2020-12-15 DIAGNOSIS — D869 Sarcoidosis, unspecified: Secondary | ICD-10-CM

## 2020-12-15 DIAGNOSIS — I10 Essential (primary) hypertension: Secondary | ICD-10-CM

## 2020-12-15 DIAGNOSIS — L732 Hidradenitis suppurativa: Secondary | ICD-10-CM | POA: Diagnosis not present

## 2020-12-15 DIAGNOSIS — Z6837 Body mass index (BMI) 37.0-37.9, adult: Secondary | ICD-10-CM

## 2020-12-15 DIAGNOSIS — J4521 Mild intermittent asthma with (acute) exacerbation: Secondary | ICD-10-CM

## 2020-12-15 DIAGNOSIS — R0602 Shortness of breath: Secondary | ICD-10-CM

## 2020-12-15 LAB — POCT GLYCOSYLATED HEMOGLOBIN (HGB A1C): Hemoglobin A1C: 6.1 % — AB (ref 4.0–5.6)

## 2020-12-15 MED ORDER — METHYLPREDNISOLONE 4 MG PO TBPK: ORAL_TABLET | ORAL | 0 refills | Status: AC

## 2020-12-15 MED ORDER — NYSTATIN 100000 UNIT/GM EX OINT: 1.0000 "application " | TOPICAL_OINTMENT | Freq: Two times a day (BID) | CUTANEOUS | 1 refills | Status: DC

## 2020-12-15 MED ORDER — AMOXICILLIN-POT CLAVULANATE 875-125 MG PO TABS: 1.0000 | ORAL_TABLET | Freq: Two times a day (BID) | ORAL | 0 refills | Status: DC

## 2020-12-15 NOTE — Telephone Encounter (Signed)
Lm for patient.  

## 2020-12-15 NOTE — Progress Notes (Signed)
[name], [place]  [address] Phone:  [phone]   Fax:  [phone]   Established Patient Office Visit  Subjective:  Patient ID: Crystal Bullock, female    DOB: May 12, 1965  Age: 55 y.o. MRN: [id number]  CC:  Chief Complaint  Patient presents with   Follow-up    Boil on inner tight,since Tuesday has put warm compression.  Patient stated she is having respiratory issues, neg COVID test., has been using nebulizer and inhaler.     HPI Crystal Bullock presents for follow up. A former patient of NP [place]. She  has a past medical history of Anxiety (10/06/2015), Asthma, Atypical chest pain (10/06/2015), Chronic neck pain, Fibroid, Hypertension, Palpitations (10/06/2015), RBBB (10/06/2015), Sarcoidosis, Scoliosis, Seasonal allergies, Sickle cell trait ([place]), and Vitamin D deficiency (05/2019).   She has HS and the anbx therapy is effective however she is needs additional anbx prescription. She is concern that she has a "sinus infection". She has been having wheezing.along with chest pain. She is using breathing treatment. She reports that when she lays flat she has shortness of breat and rattling. She has a history of pneumonia. She endorses fever and chills. She admits to not taking her cetrizine daily. She thought her allergies where just in the spring. However her symptoms are ongoing. She has sarcoidosis and is following up in [place] the specialist. She has OSA and reports not using her CPAP machine that his brand new due to her reading the possible side effects with use of the machine.   She is now primary care giver of 2 of her grandchildren. She is aware that she must make some changes her weight etc    Past Medical History:  Diagnosis Date   Anxiety 10/06/2015   Asthma    Atypical chest pain 10/06/2015   Chronic neck pain    Fibroid    Hypertension    Palpitations 10/06/2015   RBBB 10/06/2015   Sarcoidosis    Scoliosis     Seasonal allergies    Sickle cell trait ([place])    Vitamin D deficiency 05/2019    Past Surgical History:  Procedure Laterality Date   AXILLARY LYMPH NODE DISSECTION     CHOLECYSTECTOMY      Family History  Problem Relation Age of Onset   Hypertension Mother    Diabetes Mother     Social History   Socioeconomic History   Marital status: Single    Spouse name: Not on file   Number of children: Not on file   Years of education: Not on file   Highest education level: Not on file  Occupational History   Not on file  Tobacco Use   Smoking status: Never   Smokeless tobacco: Never  Vaping Use   Vaping Use: Never used  Substance and Sexual Activity   Alcohol use: Yes    Comment: occasional    Drug use: Yes    Types: Marijuana    Comment: once a week   Sexual activity: Yes    Birth control/protection: Condom  Other Topics Concern   Not on file  Social History Narrative   Not on file   Social Determinants of Health   Financial Resource Strain: Low Risk    Difficulty of Paying Living Expenses: Not hard at all  Food Insecurity: No Food Insecurity   Worried About Charity fundraiser in the Last Year: Never true   [employer]  in the Last Year: Never true  Transportation Needs: No Transportation Needs   Lack of Transportation (Medical): No   Lack of Transportation (Non-Medical): No  Physical Activity: Insufficiently Active   Days of Exercise per Week: 3 days   Minutes of Exercise per Session: 30 min  Stress: Stress Concern Present   Feeling of Stress : To some extent  Social Connections: Moderately Integrated   Frequency of Communication with Friends and Family: More than three times a week   Frequency of Social Gatherings with Friends and Family: Once a week   Attends Religious Services: More than 4 times per year   Active Member of [employer] or Organizations: Yes   Attends Music therapist: More than 4 times per year   Marital Status: Never married   Human resources officer Violence: Not At Risk   Fear of Current or Ex-Partner: No   Emotionally Abused: No   Physically Abused: No   Sexually Abused: No    Outpatient Medications Prior to Visit  Medication Sig Dispense Refill   albuterol (PROVENTIL) (2.5 MG/3ML) 0.083% nebulizer solution USE 1 VIAL VIA NEBULIZER EVERY 6 HOURS AS NEEDED FOR WHEEZING OR SHORTNESS OF BREATH 75 mL 1   albuterol (VENTOLIN HFA) 108 (90 Base) MCG/ACT inhaler INHALE 2 PUFFS INTO THE LUNGS EVERY 6 HOURS FOR UP TO 90 DOSES AS NEEDED FOR WHEEZING 6.7 g 1   amLODipine (NORVASC) 10 MG tablet TAKE 1 TABLET(10 MG) BY MOUTH DAILY 90 tablet 3   busPIRone (BUSPAR) 7.5 MG tablet Take 1 tablet (7.5 mg total) by mouth 3 (three) times daily. 60 tablet 2   cetirizine (ZYRTEC) 10 MG tablet TAKE 1 TABLET(10 MG) BY MOUTH DAILY AS NEEDED FOR ALLERGIES 30 tablet 2   esomeprazole (NEXIUM) 40 MG capsule TAKE 1 CAPSULE(40 MG) BY MOUTH DAILY 30 capsule 3   fluticasone (FLONASE) 50 MCG/ACT nasal spray Place 2 sprays into both nostrils daily as needed. allergies 9.9 mL 11   hydrOXYzine (VISTARIL) 50 MG capsule Take as prescribed as AVS. 120 capsule 1   Multiple Vitamins-Minerals (MULTIVITAMINS THER. W/MINERALS) TABS Take 1 tablet by mouth daily.     ondansetron (ZOFRAN ODT) 4 MG disintegrating tablet Take 1 tablet (4 mg total) by mouth every 8 (eight) hours as needed for nausea or vomiting. 20 tablet 0   oxyCODONE-acetaminophen (PERCOCET) 10-325 MG tablet Take 1 tablet by mouth every 8 (eight) hours as needed for pain.     Vitamin D, Ergocalciferol, (DRISDOL) 1.25 MG (50000 UNIT) CAPS capsule Take 1 capsule (50,000 Units total) by mouth every 7 (seven) days. 5 capsule 6   nystatin ointment (MYCOSTATIN) Apply 1 application topically 2 (two) times daily. 30 g 1   cyclobenzaprine (FLEXERIL) 10 MG tablet Take 10 mg by mouth 3 (three) times daily as needed.     gabapentin (NEURONTIN) 300 MG capsule Take 300 mg by mouth 2 (two) times daily as needed.      ibuprofen (ADVIL) 800 MG tablet Take 800 mg by mouth 3 (three) times daily.     LINZESS 72 MCG capsule Take 72 mcg by mouth daily.     No facility-administered medications prior to visit.    Allergies  Allergen Reactions   Ivp Dye [Iodinated Diagnostic Agents]     Hives on hands and feet per pt 08/16/15 pt had scan w/ contrast and had a 4 hr premedication protocol done    ROS Review of Systems    Objective:    Physical Exam Constitutional:  Appearance: She is obese.  HENT:     Head: Normocephalic and atraumatic.  Cardiovascular:     Rate and Rhythm: Normal rate and regular rhythm.     Pulses: Normal pulses.     Heart sounds: Normal heart sounds.  Pulmonary:     Effort: Pulmonary effort is normal.     Breath sounds: Rhonchi present.  Musculoskeletal:        General: Normal range of motion.     Cervical back: Normal range of motion.  Skin:    Capillary Refill: Capillary refill takes less than 2 seconds.     Comments: Abscess to left inner thigh  Neurological:     General: No focal deficit present.     Mental Status: She is alert and oriented to person, place, and time.  Psychiatric:        Mood and Affect: Mood normal.        Behavior: Behavior normal.        Thought Content: Thought content normal.        Judgment: Judgment normal.    BP (!) 154/93   Pulse 89   Temp (!) 97.3 F (36.3 C)   Ht 5\' 7"  (1.702 m)   Wt 241 lb (109.3 kg)   SpO2 99%   BMI 37.75 kg/m  Wt Readings from Last 3 Encounters:  12/15/20 241 lb (109.3 kg)  07/27/20 249 lb (112.9 kg)  06/12/20 250 lb (113.4 kg)     Health Maintenance Due  Topic Date Due   COVID-19 Vaccine (1) Never done   Pneumococcal Vaccine 42-19 Years old (1 - PCV) Never done   Hepatitis C Screening  Never done   TETANUS/TDAP  Never done   Zoster Vaccines- Shingrix (1 of 2) Never done   COLONOSCOPY (Pts 45-75yrs Insurance coverage will need to be confirmed)  Never done   PAP SMEAR-Modifier  10/04/2018    MAMMOGRAM  05/13/2019    There are no preventive care reminders to display for this patient.  Lab Results  Component Value Date   TSH 0.866 06/08/2019   Lab Results  Component Value Date   WBC 11.2 (H) 09/10/2019   HGB 11.8 (L) 09/10/2019   HCT 34.7 (L) 09/10/2019   MCV 74.6 (L) 09/10/2019   PLT 288 09/10/2019   Lab Results  Component Value Date   NA 141 09/10/2019   K 3.5 09/10/2019   CO2 27 09/10/2019   GLUCOSE 104 (H) 09/10/2019   BUN 18 09/10/2019   CREATININE 1.02 (H) 09/10/2019   BILITOT 0.2 (L) 09/10/2019   ALKPHOS 55 09/10/2019   AST 19 09/10/2019   ALT 18 09/10/2019   PROT 7.2 09/10/2019   ALBUMIN 3.5 09/10/2019   CALCIUM 8.9 09/10/2019   ANIONGAP 10 09/10/2019   No results found for: CHOL No results found for: HDL No results found for: LDLCALC No results found for: TRIG No results found for: CHOLHDL Lab Results  Component Value Date   HGBA1C 6.1 (A) 12/15/2020      Assessment & Plan:   Problem List Items Addressed This Visit       Cardiovascular and Mediastinum   Essential hypertension Persistent Encouraged on going compliance with current medication regimen Encouraged home monitoring and recording BP <130/80 Eating a heart-healthy diet with less salt Encouraged regular physical activity  Recommend Weight loss       Respiratory   Asthma Worsening    Relevant Medications   methylPREDNISolone (MEDROL) 4 MG TBPK tablet  OSA (obstructive sleep apnea) Discussion related to the importance of compliance and possible risk OSA left untreated     Musculoskeletal and Integument   Hidradenitis suppurativa Recurrent   Relevant Medications   nystatin ointment (MYCOSTATIN)   amoxicillin-clavulanate (AUGMENTIN) 875-125 MG tablet     Other   Sarcoidosis   Relevant Orders   DG Chest 2 View (Completed)   Prediabetes - Primary Consider home glucose monitoring Weight loss at least 5% of current body weight is can be achieved with lifestyle  modification dietary changes and regular daily exercise Encourage blood pressure control goal <120/80 and maintaining total cholesterol <200 Follow-up every 3 to 6 months for reevaluation Education material provided    Relevant Orders   HgB A1c (Completed)   Comp. Metabolic Panel (12) (Completed)   Other Visit Diagnoses     Candidiasis       Relevant Medications   nystatin ointment (MYCOSTATIN)   Rhonchi at left lung base        Relevant Orders   DG Chest 2 View (Completed)   CBC with Differential/Platelet (Completed)   Shortness of breath    Worsening     Relevant Medications   methylPREDNISolone (MEDROL) 4 MG TBPK tablet   Class 2 severe obesity with serious comorbidity and body mass index (BMI) of 37.0 to 37.9 in adult, unspecified obesity type ([place])     Obesity with BMI and comorbidities as noted above.  Discussed proper diet (low fat, low sodium, high fiber) with patient.   Discussed need for regular exercise (3 times per week, 20 minutes per session) with patient.        Meds ordered this encounter  Medications   nystatin ointment (MYCOSTATIN)    Sig: Apply 1 application topically 2 (two) times daily.    Dispense:  30 g    Refill:  1   amoxicillin-clavulanate (AUGMENTIN) 875-125 MG tablet    Sig: Take 1 tablet by mouth 2 (two) times daily.    Dispense:  20 tablet    Refill:  0    Order Specific Question:   Supervising Provider    Answer:   Tresa Garter [[id number]]   methylPREDNISolone (MEDROL) 4 MG TBPK tablet    Sig: Follow package instructions.    Dispense:  21 tablet    Refill:  0    Do not add to the "Automatic Refill" notification system.    Order Specific Question:   Supervising Provider    Answer:   Tresa Garter [[id number]]    Follow-up: Return in about 3 months (around 03/17/2021) for Follow up HTN [id number].    Vevelyn Francois, NP

## 2020-12-15 NOTE — Patient Instructions (Signed)
Prediabetes Eating Plan ?Prediabetes is a condition that causes blood sugar (glucose) levels to be higher than normal. This increases the risk for developing type 2 diabetes (type 2 diabetes mellitus). Working with a health care provider or nutrition specialist (dietitian) to make diet and lifestyle changes can help prevent the onset of diabetes. These changes may help you: ?Control your blood glucose levels. ?Improve your cholesterol levels. ?Manage your blood pressure. ?What are tips for following this plan? ?Reading food labels ?Read food labels to check the amount of fat, salt (sodium), and sugar in prepackaged foods. Avoid foods that have: ?Saturated fats. ?Trans fats. ?Added sugars. ?Avoid foods that have more than 300 milligrams (mg) of sodium per serving. Limit your sodium intake to less than 2,300 mg each day. ?Shopping ?Avoid buying pre-made and processed foods. ?Avoid buying drinks with added sugar. ?Cooking ?Cook with olive oil. Do not use butter, lard, or ghee. ?Bake, broil, grill, steam, or boil foods. Avoid frying. ?Meal planning ? ?Work with your dietitian to create an eating plan that is right for you. This may include tracking how many calories you take in each day. Use a food diary, notebook, or mobile application to track what you eat at each meal. ?Consider following a Mediterranean diet. This includes: ?Eating several servings of fresh fruits and vegetables each day. ?Eating fish at least twice a week. ?Eating one serving each day of whole grains, beans, nuts, and seeds. ?Using olive oil instead of other fats. ?Limiting alcohol. ?Limiting red meat. ?Using nonfat or low-fat dairy products. ?Consider following a plant-based diet. This includes dietary choices that focus on eating mostly vegetables and fruit, grains, beans, nuts, and seeds. ?If you have high blood pressure, you may need to limit your sodium intake or follow a diet such as the DASH (Dietary Approaches to Stop Hypertension) eating  plan. The DASH diet aims to lower high blood pressure. ?Lifestyle ?Set weight loss goals with help from your health care team. It is recommended that most people with prediabetes lose 7% of their body weight. ?Exercise for at least 30 minutes 5 or more days a week. ?Attend a support group or seek support from a mental health counselor. ?Take over-the-counter and prescription medicines only as told by your health care provider. ?What foods are recommended? ?Fruits ?Berries. Bananas. Apples. Oranges. Grapes. Papaya. Mango. Pomegranate. Kiwi. Grapefruit. Cherries. ?Vegetables ?Lettuce. Spinach. Peas. Beets. Cauliflower. Cabbage. Broccoli. Carrots. Tomatoes. Squash. Eggplant. Herbs. Peppers. Onions. Cucumbers. Brussels sprouts. ?Grains ?Whole grains, such as whole-wheat or whole-grain breads, crackers, cereals, and pasta. Unsweetened oatmeal. Bulgur. Barley. Quinoa. Brown rice. Corn or whole-wheat flour tortillas or taco shells. ?Meats and other proteins ?Seafood. Poultry without skin. Lean cuts of pork and beef. Tofu. Eggs. Nuts. Beans. ?Dairy ?Low-fat or fat-free dairy products, such as yogurt, cottage cheese, and cheese. ?Beverages ?Water. Tea. Coffee. Sugar-free or diet soda. Seltzer water. Low-fat or nonfat milk. Milk alternatives, such as soy or almond milk. ?Fats and oils ?Olive oil. Canola oil. Sunflower oil. Grapeseed oil. Avocado. Walnuts. ?Sweets and desserts ?Sugar-free or low-fat pudding. Sugar-free or low-fat ice cream and other frozen treats. ?Seasonings and condiments ?Herbs. Sodium-free spices. Mustard. Relish. Low-salt, low-sugar ketchup. Low-salt, low-sugar barbecue sauce. Low-fat or fat-free mayonnaise. ?The items listed above may not be a complete list of recommended foods and beverages. Contact a dietitian for more information. ?What foods are not recommended? ?Fruits ?Fruits canned with syrup. ?Vegetables ?Canned vegetables. Frozen vegetables with butter or cream sauce. ?Grains ?Refined white  flour and flour   products, such as bread, pasta, snack foods, and cereals. Meats and other proteins Fatty cuts of meat. Poultry with skin. Breaded or fried meat. Processed meats. Dairy Full-fat yogurt, cheese, or milk. Beverages Sweetened drinks, such as iced tea and soda. Fats and oils Butter. Lard. Ghee. Sweets and desserts Baked goods, such as cake, cupcakes, pastries, cookies, and cheesecake. Seasonings and condiments Spice mixes with added salt. Ketchup. Barbecue sauce. Mayonnaise. The items listed above may not be a complete list of foods and beverages that are not recommended. Contact a dietitian for more information. Where to find more information American Diabetes Association: www.diabetes.org Summary You may need to make diet and lifestyle changes to help prevent the onset of diabetes. These changes can help you control blood sugar, improve cholesterol levels, and manage blood pressure. Set weight loss goals with help from your health care team. It is recommended that most people with prediabetes lose 7% of their body weight. Consider following a Mediterranean diet. This includes eating plenty of fresh fruits and vegetables, whole grains, beans, nuts, seeds, fish, and low-fat dairy, and using olive oil instead of other fats. This information is not intended to replace advice given to you by your health care provider. Make sure you discuss any questions you have with your health care provider. Document Revised: 04/29/2019 Document Reviewed: 04/29/2019 Elsevier Patient Education  [place].  Sleep Apnea Sleep apnea affects breathing during sleep. It causes breathing to stop for 10 seconds or more, or to become shallow. People with sleep apnea usually snore loudly. It can also increase the risk of: Heart attack. Stroke. Being very overweight (obese). Diabetes. Heart failure. Irregular heartbeat. High blood pressure. The goal of treatment is to help you breathe  normally again. What are the causes? The most common cause of this condition is a collapsed or blocked airway. There are three kinds of sleep apnea: Obstructive sleep apnea. This is caused by a blocked or collapsed airway. Central sleep apnea. This happens when the brain does not send the right signals to the muscles that control breathing. Mixed sleep apnea. This is a combination of obstructive and central sleep apnea. What increases the risk? Being overweight. Smoking. Having a small airway. Being older. Being female. Drinking alcohol. Taking medicines to calm yourself (sedatives or tranquilizers). Having family members with the condition. Having a tongue or tonsils that are larger than normal. What are the signs or symptoms? Trouble staying asleep. Loud snoring. Headaches in the morning. Waking up gasping. Dry mouth or sore throat in the morning. Being sleepy or tired during the day. If you are sleepy or tired during the day, you may also: Not be able to focus your mind (concentrate). Forget things. Get angry a lot and have mood swings. Feel sad (depressed). Have changes in your personality. Have less interest in sex, if you are female. Be unable to have an erection, if you are female. How is this treated?  Sleeping on your side. Using a medicine to get rid of mucus in your nose (decongestant). Avoiding the use of alcohol, medicines to help you relax, or certain pain medicines (narcotics). Losing weight, if needed. Changing your diet. Quitting smoking. Using a machine to open your airway while you sleep, such as: An oral appliance. This is a mouthpiece that shifts your lower jaw forward. A CPAP device. This device blows air through a mask when you breathe out (exhale). An EPAP device. This has valves that you put in each nostril. A BIPAP device. This  device blows air through a mask when you breathe in (inhale) and breathe out. Having surgery if other treatments do not  work. Follow these instructions at home: Lifestyle Make changes that your doctor recommends. Eat a healthy diet. Lose weight if needed. Avoid alcohol, medicines to help you relax, and some pain medicines. Do not smoke or use any products that contain nicotine or tobacco. If you need help quitting, ask your doctor. General instructions Take over-the-counter and prescription medicines only as told by your doctor. If you were given a machine to use while you sleep, use it only as told by your doctor. If you are having surgery, make sure to tell your doctor you have sleep apnea. You may need to bring your device with you. Keep all follow-up visits. Contact a doctor if: The machine that you were given to use during sleep bothers you or does not seem to be working. You do not get better. You get worse. Get help right away if: Your chest hurts. You have trouble breathing in enough air. You have an uncomfortable feeling in your back, arms, or stomach. You have trouble talking. One side of your body feels weak. A part of your face is hanging down. These symptoms may be an emergency. Get help right away. Call your local emergency services (911 in the [place] Do not wait to see if the symptoms will go away. Do not drive yourself to the hospital. Summary This condition affects breathing during sleep. The most common cause is a collapsed or blocked airway. The goal of treatment is to help you breathe normally while you sleep. This information is not intended to replace advice given to you by your health care provider. Make sure you discuss any questions you have with your health care provider. Document Revised: 09/06/2020 Document Reviewed: 01/07/2020 Elsevier Patient Education  2022 [employer].  Sarcoidosis Sarcoidosis is a disease that can cause inflammation in many areas of the body. It most often affects the lungs (pulmonary sarcoidosis). Sarcoidosis can also affect the lymph nodes,  liver, eyes, skin, heart, or any other body tissue. Normally, cells that are part of the body's disease-fighting system (immune system) attack harmful substances in the body, such as germs. This immune system response causes inflammation. After the harmful substance is destroyed, the inflammation goes away. When you have sarcoidosis, your immune system causes inflammation even when there are no harmful substances, and the inflammation does not go away. Sarcoidosis also causes cells from your immune system to form small lumps (granulomas) in the affected area of your body. What are the causes? The exact cause of sarcoidosis is not known.  If you have a family history of this disease (genetic predisposition), the immune system response that leads to inflammation may be triggered by something in your environment, such as: Bacteria or viruses. Metals. Chemicals. Dust. Mold or mildew. What increases the risk? You may be more likely to develop this condition if you: Have a family history of the disease. Are African American. Are of Northern European descent. Are 75-53 years old. Are female. Work as a Airline pilot. Work in an environment where you are exposed to metals, chemicals, mold or mildew, or insecticides. What are the signs or symptoms? Some people with sarcoidosis have no symptoms. Others have very mild symptoms. The symptoms usually depend on the organ that is affected. Sarcoidosis most often affects the lungs, which may lead to symptoms such as: Chest pain. Coughing. Wheezing. Shortness of breath. Other common symptoms include: Night  sweats. Fever. Weight loss. Tiredness (fatigue). Swollen lymph nodes. Joint pain. How is this diagnosed? This condition may be diagnosed based on: Your symptoms and medical history. A physical exam. Imaging tests such as: Chest X-ray. CT scan. MRI. PET scan. Lung function tests. These tests evaluate your breathing and check for problems that  may be related to sarcoidosis. A procedure to remove a tissue sample for testing (biopsy). You may have a biopsy of lung tissue if that is where you are having symptoms. You may have tests to check for any complications of the condition. These tests may include: Eye exams. MRI of the heart or brain. Echocardiogram. ECG (electrocardiogram). How is this treated? In some cases, sarcoidosis does not require a specific treatment because it causes no symptoms or only mild symptoms. If your symptoms bother you or are severe, you may be prescribed medicines to reduce inflammation or relieve symptoms. These medicines may include: Prednisone. This is a steroid that reduces inflammation related to sarcoidosis. Hydroxychloroquine. This may be used to treat sarcoidosis that affects the skin, eyes, or brain. Certain medicines that affect the immune system. These can help with sarcoidosis in the joints, eyes, skin, or lungs. Medicines that you breathe in (inhalers). Inhalers can help you breathe if sarcoidosis affects your lungs. Follow these instructions at home:  Do not use any products that contain nicotine or tobacco. These products include cigarettes, chewing tobacco, and vaping devices, such as e-cigarettes. If you need help quitting, ask your health care provider. Avoid secondhand smoke and irritating dust or chemicals. Stay indoors on days when air quality is poor in your area. Return to your normal activities as told by your health care provider. Ask your health care provider what activities are safe for you. Take or use over-the-counter and prescription medicines only as told by your health care provider. Keep all follow-up visits. This is important. Where to find more information National Heart, Lung, and Blood Institute: https://wilson-eaton.com/ Contact a health care provider if: You have vision problems. You have a dry cough that does not go away. You have an irregular heartbeat. You have pain or  aches in your joints, hands, or feet. You have an unexplained rash. Get help right away if: You have chest pain. You have trouble breathing. These symptoms may represent a serious problem that is an emergency. Do not wait to see if the symptoms will go away. Get medical help right away. Call your local emergency services (911 in the [place] Do not drive yourself to the hospital. Summary Sarcoidosis is a disease that can cause inflammation in many body areas of the body. It most often affects the lungs (pulmonary sarcoidosis). It can also affect the lymph nodes, liver, eyes, skin, heart, or any other body tissue. When you have sarcoidosis, cells from your immune system form small lumps (granulomas) in the affected area of your body. Sarcoidosis sometimes does not require a specific treatment because it causes no symptoms or only mild symptoms. If your symptoms bother you or are severe, you may be prescribed medicines to reduce inflammation or relieve symptoms. This information is not intended to replace advice given to you by your health care provider. Make sure you discuss any questions you have with your health care provider. Document Revised: 11/30/2019 Document Reviewed: 11/30/2019 Elsevier Patient Education  2022 [employer].

## 2020-12-16 LAB — CBC WITH DIFFERENTIAL/PLATELET
Basophils Absolute: 0.1 10*3/uL (ref 0.0–0.2)
Basos: 1 %
EOS (ABSOLUTE): 0.1 10*3/uL (ref 0.0–0.4)
Eos: 1 %
Hematocrit: 40.4 % (ref 34.0–46.6)
Hemoglobin: 13.4 g/dL (ref 11.1–15.9)
Immature Grans (Abs): 0 10*3/uL (ref 0.0–0.1)
Immature Granulocytes: 0 %
Lymphocytes Absolute: 2 10*3/uL (ref 0.7–3.1)
Lymphs: 29 %
MCH: 25.6 pg — ABNORMAL LOW (ref 26.6–33.0)
MCHC: 33.2 g/dL (ref 31.5–35.7)
MCV: 77 fL — ABNORMAL LOW (ref 79–97)
Monocytes Absolute: 0.7 10*3/uL (ref 0.1–0.9)
Monocytes: 10 %
Neutrophils Absolute: 4 10*3/uL (ref 1.4–7.0)
Neutrophils: 59 %
Platelets: 239 10*3/uL (ref 150–450)
RBC: 5.23 x10E6/uL (ref 3.77–5.28)
RDW: 15.3 % (ref 11.7–15.4)
WBC: 6.8 10*3/uL (ref 3.4–10.8)

## 2020-12-16 LAB — COMP. METABOLIC PANEL (12)
AST: 33 IU/L (ref 0–40)
Albumin/Globulin Ratio: 1.3 (ref 1.2–2.2)
Albumin: 4.3 g/dL (ref 3.8–4.9)
Alkaline Phosphatase: 62 IU/L (ref 44–121)
BUN/Creatinine Ratio: 10 (ref 9–23)
BUN: 11 mg/dL (ref 6–24)
Bilirubin Total: 0.3 mg/dL (ref 0.0–1.2)
Calcium: 9.8 mg/dL (ref 8.7–10.2)
Chloride: 99 mmol/L (ref 96–106)
Creatinine, Ser: 1.06 mg/dL — ABNORMAL HIGH (ref 0.57–1.00)
Globulin, Total: 3.4 g/dL (ref 1.5–4.5)
Glucose: 94 mg/dL (ref 70–99)
Potassium: 3.9 mmol/L (ref 3.5–5.2)
Sodium: 142 mmol/L (ref 134–144)
Total Protein: 7.7 g/dL (ref 6.0–8.5)
eGFR: 63 mL/min/{1.73_m2} (ref >59)

## 2020-12-18 NOTE — Telephone Encounter (Signed)
ATC patient, LMTCB 

## 2020-12-18 NOTE — Telephone Encounter (Signed)
Spoke with the pt  She has been scheduled for ov this wk  Ill close encounter

## 2020-12-20 ENCOUNTER — Ambulatory Visit: Payer: Medicare Other | Admitting: Adult Health

## 2021-01-10 NOTE — Telephone Encounter (Signed)
Spoke to the patient and she is aware of the rules for benzodiazepine. Pt states that since change of the weather she is having to do her breathing treatment at least 3 times day. Pt also states that she wasn't taking the Vistaril medication and didn't know what it was for. I advised the pt the Vistaril medication was for her anxiety. Pt stated that she had found the Vistaril medication and was going to start taking it, to see if it would help with her jitters. Please advise

## 2021-01-19 ENCOUNTER — Other Ambulatory Visit: Payer: Self-pay | Admitting: Nurse Practitioner

## 2021-01-19 ENCOUNTER — Telehealth: Payer: Self-pay

## 2021-01-19 DIAGNOSIS — L732 Hidradenitis suppurativa: Secondary | ICD-10-CM

## 2021-01-19 MED ORDER — AMOXICILLIN-POT CLAVULANATE 875-125 MG PO TABS: 1.0000 | ORAL_TABLET | Freq: Two times a day (BID) | ORAL | 0 refills | Status: DC

## 2021-01-19 NOTE — Telephone Encounter (Signed)
Spoke to the patient and she states that she has about 3 to 4 boils under her arm and she is needing the Augmentin medication filled again. Does the patient need to be seen for this?

## 2021-01-19 NOTE — Telephone Encounter (Signed)
Patient called she is requesting a rx refill for her antibiotics, patient is also requesting a call [phone]

## 2021-02-02 ENCOUNTER — Encounter (HOSPITAL_COMMUNITY): Payer: Self-pay | Admitting: Emergency Medicine

## 2021-02-02 ENCOUNTER — Emergency Department (HOSPITAL_COMMUNITY)
Admission: EM | Admit: 2021-02-02 | Discharge: 2021-02-02 | Disposition: A | Discharge status: Home / Self Care | Payer: Medicare Other | Attending: Emergency Medicine | Admitting: Emergency Medicine

## 2021-02-02 DIAGNOSIS — J45909 Unspecified asthma, uncomplicated: Secondary | ICD-10-CM | POA: Diagnosis not present

## 2021-02-02 DIAGNOSIS — R0602 Shortness of breath: Secondary | ICD-10-CM | POA: Diagnosis present

## 2021-02-02 DIAGNOSIS — R000 Tachycardia, unspecified: Secondary | ICD-10-CM | POA: Diagnosis not present

## 2021-02-02 DIAGNOSIS — I10 Essential (primary) hypertension: Secondary | ICD-10-CM | POA: Diagnosis not present

## 2021-02-02 DIAGNOSIS — Z79899 Other long term (current) drug therapy: Secondary | ICD-10-CM | POA: Diagnosis not present

## 2021-02-02 DIAGNOSIS — J45901 Unspecified asthma with (acute) exacerbation: Secondary | ICD-10-CM

## 2021-02-02 MED ORDER — ALBUTEROL SULFATE HFA 108 (90 BASE) MCG/ACT IN AERS
1.0000 | INHALATION_SPRAY | Freq: Once | RESPIRATORY_TRACT | Status: AC
  Administered 2021-02-02: 19:00:00 1 via RESPIRATORY_TRACT
  Filled 2021-02-02: qty 6.7

## 2021-02-02 MED ORDER — IPRATROPIUM-ALBUTEROL 0.5-2.5 (3) MG/3ML IN SOLN
3.0000 mL | Freq: Once | RESPIRATORY_TRACT | Status: AC
  Administered 2021-02-02: 18:00:00 3 mL via RESPIRATORY_TRACT
  Filled 2021-02-02: qty 3

## 2021-02-02 NOTE — Discharge Instructions (Addendum)
You came to the emergency department today to be evaluated for your shortness of breath and chest tightness.  You are found to have wheezing on your physical exam.  You were given a breathing treatment with improvement in your symptoms.  I have given you a inhaler.  Please use this inhaler for wheezing or shortness of breath every 4-6 hours as needed.  Get help right away if: Your peak flow reading is less than 50% of your personal best. This is in the red zone, which means "danger." You have trouble breathing. You develop chest pain or discomfort. Your medicines no longer seem to be helping. You are coughing up bloody mucus. You have a fever and your symptoms suddenly get worse. You have trouble swallowing. You feel very tired, and breathing becomes tiring.
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
## 2021-02-02 NOTE — ED Notes (Signed)
Pt expressed relief following nebulizer treatment.

## 2021-02-11 ENCOUNTER — Other Ambulatory Visit: Payer: Self-pay

## 2021-02-11 ENCOUNTER — Encounter (HOSPITAL_COMMUNITY): Payer: Self-pay

## 2021-02-11 ENCOUNTER — Emergency Department (HOSPITAL_COMMUNITY): Payer: Medicare Other

## 2021-02-11 ENCOUNTER — Emergency Department (HOSPITAL_COMMUNITY)
Admission: EM | Admit: 2021-02-11 | Discharge: 2021-02-11 | Disposition: A | Discharge status: Home / Self Care | Payer: Medicare Other | Attending: Emergency Medicine | Admitting: Emergency Medicine

## 2021-02-11 DIAGNOSIS — Y92009 Unspecified place in unspecified non-institutional (private) residence as the place of occurrence of the external cause: Secondary | ICD-10-CM | POA: Diagnosis not present

## 2021-02-11 DIAGNOSIS — S46911A Strain of unspecified muscle, fascia and tendon at shoulder and upper arm level, right arm, initial encounter: Secondary | ICD-10-CM | POA: Diagnosis not present

## 2021-02-11 DIAGNOSIS — S4991XA Unspecified injury of right shoulder and upper arm, initial encounter: Secondary | ICD-10-CM | POA: Diagnosis present

## 2021-02-11 MED ORDER — IBUPROFEN 600 MG PO TABS
600.0000 mg | ORAL_TABLET | Freq: Four times a day (QID) | ORAL | 0 refills | Status: DC | PRN
Start: 2021-02-11 — End: 2022-03-04

## 2021-02-11 MED ORDER — CYCLOBENZAPRINE HCL 10 MG PO TABS: 10.0000 mg | ORAL_TABLET | Freq: Three times a day (TID) | ORAL | 0 refills | Status: DC | PRN

## 2021-02-11 MED ORDER — IBUPROFEN 800 MG PO TABS
800.0000 mg | ORAL_TABLET | Freq: Once | ORAL | Status: AC
  Administered 2021-02-11: 800 mg via ORAL
  Filled 2021-02-11: qty 1
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
## 2021-03-19 ENCOUNTER — Other Ambulatory Visit: Payer: Self-pay

## 2021-03-19 ENCOUNTER — Encounter: Payer: Self-pay | Admitting: Nurse Practitioner

## 2021-03-19 ENCOUNTER — Ambulatory Visit (INDEPENDENT_AMBULATORY_CARE_PROVIDER_SITE_OTHER): Payer: Medicare Other | Admitting: Nurse Practitioner

## 2021-03-19 VITALS — BP 149/88 | HR 84 | Temp 98.0°F | Ht 67.0 in | Wt 246.4 lb

## 2021-03-19 DIAGNOSIS — J4520 Mild intermittent asthma, uncomplicated: Secondary | ICD-10-CM

## 2021-03-19 DIAGNOSIS — F419 Anxiety disorder, unspecified: Secondary | ICD-10-CM

## 2021-03-19 DIAGNOSIS — L732 Hidradenitis suppurativa: Secondary | ICD-10-CM | POA: Diagnosis not present

## 2021-03-19 DIAGNOSIS — J309 Allergic rhinitis, unspecified: Secondary | ICD-10-CM

## 2021-03-19 DIAGNOSIS — D573 Sickle-cell trait: Secondary | ICD-10-CM

## 2021-03-19 DIAGNOSIS — I10 Essential (primary) hypertension: Secondary | ICD-10-CM | POA: Diagnosis not present

## 2021-03-19 DIAGNOSIS — R7303 Prediabetes: Secondary | ICD-10-CM

## 2021-03-19 DIAGNOSIS — D869 Sarcoidosis, unspecified: Secondary | ICD-10-CM | POA: Diagnosis not present

## 2021-03-19 DIAGNOSIS — Z13220 Encounter for screening for lipoid disorders: Secondary | ICD-10-CM

## 2021-03-19 DIAGNOSIS — Z6837 Body mass index (BMI) 37.0-37.9, adult: Secondary | ICD-10-CM

## 2021-03-19 DIAGNOSIS — R82998 Other abnormal findings in urine: Secondary | ICD-10-CM

## 2021-03-19 LAB — POCT URINALYSIS DIP (CLINITEK)
Bilirubin, UA: NEGATIVE
Blood, UA: NEGATIVE
Glucose, UA: NEGATIVE mg/dL
Ketones, POC UA: NEGATIVE mg/dL
Nitrite, UA: NEGATIVE
POC PROTEIN,UA: 30 — AB
Spec Grav, UA: >=1.03 — AB (ref 1.010–1.025)
Urobilinogen, UA: 0.2 E.U./dL
pH, UA: 6 (ref 5.0–8.0)

## 2021-03-19 LAB — POCT GLYCOSYLATED HEMOGLOBIN (HGB A1C)
HbA1c POC (<> result, manual entry): 6.3 % (ref 4.0–5.6)
HbA1c, POC (controlled diabetic range): 6.3 % (ref 0.0–7.0)
HbA1c, POC (prediabetic range): 6.3 % (ref 5.7–6.4)
Hemoglobin A1C: 6.3 % — AB (ref 4.0–5.6)

## 2021-03-19 MED ORDER — ALBUTEROL SULFATE (2.5 MG/3ML) 0.083% IN NEBU: INHALATION_SOLUTION | RESPIRATORY_TRACT | 1 refills | Status: DC

## 2021-03-19 MED ORDER — CETIRIZINE HCL 10 MG PO TABS: ORAL_TABLET | ORAL | 2 refills | Status: DC

## 2021-03-19 MED ORDER — ALBUTEROL SULFATE HFA 108 (90 BASE) MCG/ACT IN AERS: INHALATION_SPRAY | RESPIRATORY_TRACT | 1 refills | Status: DC

## 2021-03-19 MED ORDER — AMOXICILLIN-POT CLAVULANATE 875-125 MG PO TABS: 1.0000 | ORAL_TABLET | Freq: Two times a day (BID) | ORAL | 0 refills | Status: AC

## 2021-03-19 NOTE — Patient Instructions (Addendum)
Hypertension, Adult Hypertension is another name for high blood pressure. High blood pressure forces your heart to work harder to pump blood. This can cause problems over time. There are two numbers in a blood pressure reading. There is a top number (systolic) over a bottom number (diastolic). It is best to have a blood pressure that is below 120/80. Healthy choices can help lower your blood pressure, or you may need medicine to help lower it. What are the causes? The cause of this condition is not known. Some conditions may be related to high blood pressure. What increases the risk? Smoking. Having type 2 diabetes mellitus, high cholesterol, or both. Not getting enough exercise or physical activity. Being overweight. Having too much fat, sugar, calories, or salt (sodium) in your diet. Drinking too much alcohol. Having long-term (chronic) kidney disease. Having a family history of high blood pressure. Age. Risk increases with age. Race. You may be at higher risk if you are African American. Gender. Men are at higher risk than women before age 29. After age 92, women are at higher risk than men. Having obstructive sleep apnea. Stress. What are the signs or symptoms? High blood pressure may not cause symptoms. Very high blood pressure (hypertensive crisis) may cause: Headache. Feelings of worry or nervousness (anxiety). Shortness of breath. Nosebleed. A feeling of being sick to your stomach (nausea). Throwing up (vomiting). Changes in how you see. Very bad chest pain. Seizures. How is this treated? This condition is treated by making healthy lifestyle changes, such as: Eating healthy foods. Exercising more. Drinking less alcohol. Your health care provider may prescribe medicine if lifestyle changes are not enough to get your blood pressure under control, and if: Your top number is above 130. Your bottom number is above 80. Your personal target blood pressure may vary. Follow  these instructions at home: Eating and drinking  If told, follow the DASH eating plan. To follow this plan: Fill one half of your plate at each meal with fruits and vegetables. Fill one fourth of your plate at each meal with whole grains. Whole grains include whole-wheat pasta, brown rice, and whole-grain bread. Eat or drink low-fat dairy products, such as skim milk or low-fat yogurt. Fill one fourth of your plate at each meal with low-fat (lean) proteins. Low-fat proteins include fish, chicken without skin, eggs, beans, and tofu. Avoid fatty meat, cured and processed meat, or chicken with skin. Avoid pre-made or processed food. Eat less than 1,500 mg of salt each day. Do not drink alcohol if: Your doctor tells you not to drink. You are pregnant, may be pregnant, or are planning to become pregnant. If you drink alcohol: Limit how much you use to: 0-1 drink a day for women. 0-2 drinks a day for men. Be aware of how much alcohol is in your drink. In the [place] one drink equals one 12 oz bottle of beer (355 mL), one 5 oz glass of wine (148 mL), or one 1 oz glass of hard liquor (44 mL). Lifestyle  Work with your doctor to stay at a healthy weight or to lose weight. Ask your doctor what the best weight is for you. Get at least 30 minutes of exercise most days of the week. This may include walking, swimming, or biking. Get at least 30 minutes of exercise that strengthens your muscles (resistance exercise) at least 3 days a week. This may include lifting weights or doing Pilates. Do not use any products that contain nicotine or tobacco, such  as cigarettes, e-cigarettes, and chewing tobacco. If you need help quitting, ask your doctor. Check your blood pressure at home as told by your doctor. Keep all follow-up visits as told by your doctor. This is important. Medicines Take over-the-counter and prescription medicines only as told by your doctor. Follow directions carefully. Do not skip doses of  blood pressure medicine. The medicine does not work as well if you skip doses. Skipping doses also puts you at risk for problems. Ask your doctor about side effects or reactions to medicines that you should watch for. Contact a doctor if you: Think you are having a reaction to the medicine you are taking. Have headaches that keep coming back (recurring). Feel dizzy. Have swelling in your ankles. Have trouble with your vision. Get help right away if you: Get a very bad headache. Start to feel mixed up (confused). Feel weak or numb. Feel faint. Have very bad pain in your: Chest. Belly (abdomen). Throw up more than once. Have trouble breathing. Summary Hypertension is another name for high blood pressure. High blood pressure forces your heart to work harder to pump blood. For most people, a normal blood pressure is less than 120/80. Making healthy choices can help lower blood pressure. If your blood pressure does not get lower with healthy choices, you may need to take medicine. This information is not intended to replace advice given to you by your health care provider. Make sure you discuss any questions you have with your health care provider. Document Revised: 10/08/2017 Document Reviewed: 10/08/2017 Elsevier Patient Education  2022 [place], Cimicifuga racemosa oral dosage forms What is this medication? BLACK COHOSH (blak  KOH hosh) or Cimicifuga racemosa is a dietary supplement. It is promoted to relieve symptoms of menopause, such as hot flashes. The FDA has not approved this supplement for any medical use. This supplement may be used for other purposes; ask your health care provider or pharmacist if you have questions. This medicine may be used for other purposes; ask your health care provider or pharmacist if you have questions. What should I tell my care team before I take this medication? They need to know if you have any of these conditions: breast  cancer cervical, ovarian or uterine cancer high blood pressure infertility liver disease menstrual changes or irregular periods unusual vaginal or uterine bleeding an unusual or allergic reaction to black cohosh, soybeans, tartrazine dye (yellow dye number 5), other medicines, foods, dyes, or preservatives pregnant or trying to get pregnant breast-feeding How should I use this medication? Take this herb by mouth with a glass of water. Follow the directions on the package labeling, or talk to your health care professional. Do not use for longer than 6 months without the advice of a health care professional. Do not use if you are pregnant or breast-feeding. Talk to your obstetrician-gynecologist or certified nurse-midwife. This herb is not for use in children under the age of 30 years. Overdosage: If you think you have taken too much of this medicine contact a poison control center or emergency room at once. NOTE: This medicine is only for you. Do not share this medicine with others. What if I miss a dose? If you miss a dose, take it as soon as you can. If it is almost time for your next dose, take only that dose. Do not take double or extra doses. What may interact with this medication? atorvastatin cisplatin fertility treatments This list may not describe all  possible interactions. Give your health care provider a list of all the medicines, herbs, non-prescription drugs, or dietary supplements you use. Also tell them if you smoke, drink alcohol, or use illegal drugs. Some items may interact with your medicine. What should I watch for while using this medication? Since this herb is derived from a plant, allergic reactions are possible. Stop using this herb if you develop a rash. You may need to see your health care professional, or inform them that this occurred. Report any unusual side effects promptly. If you are taking this herb for menstrual or menopausal symptoms, visit your doctor or  health care professional for regular checks on your progress. You should have a complete check-up every 6 months. You will need a regular breast and pelvic exam while on this therapy. Follow the advice of your doctor or health care professional. Women should inform their doctor if they wish to become pregnant or think they might be pregnant. If you have any reason to think you are pregnant, stop taking this herb at once and contact your doctor or health care professional. Herbal or dietary supplements are not regulated like medicines. Rigid quality control standards are not required for dietary supplements. The purity and strength of these products can vary. The safety and effect of this dietary supplement for a certain disease or illness is not well known. This product is not intended to diagnose, treat, cure or prevent any disease. The Food and Drug Administration suggests the following to help consumers protect themselves: Always read product labels and follow directions. Natural does not mean a product is safe for humans to take. Look for products that include USP after the ingredient name. This means that the manufacturer followed the standards of the [place].S[place] Pharmacopoeia. Supplements made or sold by a nationally known food or drug company are more likely to be made under tight controls. You can write to the company for more information about how the product was made. What side effects may I notice from receiving this medication? Side effects that you should report to your doctor or health care professional as soon as possible: allergic reactions like skin rash, itching or hives, swelling of the face, lips, or tongue breathing problems dizziness palpitations signs and symptoms of liver injury like dark yellow or brown urine; general ill feeling or flu-like symptoms; light-colored stools; loss of appetite; nausea; right upper belly pain; unusually weak or tired; yellowing of the eyes or skin unusual  vaginal bleeding Side effects that usually do not require medical attention (report to your doctor or health care professional if they continue or are bothersome): breast tenderness headache nausea upset stomach This list may not describe all possible side effects. Call your doctor for medical advice about side effects. You may report side effects to FDA at 1-800-FDA-1088. Where should I keep my medication? Keep out of the reach of children. Store at room temperature between 15 and 30 degrees C (59 and 86 degrees C). Throw away any unused herb after the expiration date. NOTE: This sheet is a summary. It may not cover all possible information. If you have questions about this medicine, talk to your doctor, pharmacist, or health care provider.  2022 Elsevier/Gold Standard (2015-08-09 00:00:00)

## 2021-03-19 NOTE — Progress Notes (Signed)
[address], [place]  [address] Phone:  [phone]   Fax:  [phone]   Established Patient Office Visit  Subjective:  Patient ID: Crystal Bullock, female    DOB: 04-07-1965  Age: 56 y.o. MRN: [id number]  CC:  Chief Complaint  Patient presents with   Follow-up    Pt is here today for her 3 month follow up visit. Pt states that she has some boils on her inner thigh and is needing some more antibiotics. Pt has also been having right shoulder pains since February 02, 2021    HPI Crystal Bullock presents for follow up. She  has a past medical history of Anxiety (10/06/2015), Asthma, Atypical chest pain (10/06/2015), Chronic neck pain, Fibroid, Hypertension, Palpitations (10/06/2015), RBBB (10/06/2015), Sarcoidosis, Scoliosis, Seasonal allergies, Sickle cell trait ([place]), and Vitamin D deficiency (05/2019).   She continues to have flare up. She does follow up with dermatology in [facility]. She was told she has bad skin. She is SP surgery. She no longer is drinking alcohol. She has had problems since 18 yrs. She has these symptoms every 3 months. Augmentin is very effective. She notices this after on tablet.   She feels like the SOB causes anxiety. She is not taking Buspar. She feels like the anxiety is getting worse. She is unable to be around crowds. She reports that the lorazepam  She has some expired. She felt like they are calming.   She feels like she is going through menopause for several years. The hot flashes for a few seconds however can be frequent as Q 15 minutes. She feels like this is getting worse. She has tried black cohosh in the past however only a few doses and discontinued. She is looking for natural remedies.    Past Medical History:  Diagnosis Date   Anxiety 10/06/2015   Asthma    Atypical chest pain 10/06/2015   Chronic neck pain    Fibroid    Hypertension    Palpitations 10/06/2015   RBBB 10/06/2015    Sarcoidosis    Scoliosis    Seasonal allergies    Sickle cell trait ([place])    Vitamin D deficiency 05/2019    Past Surgical History:  Procedure Laterality Date   AXILLARY LYMPH NODE DISSECTION     CHOLECYSTECTOMY      Family History  Problem Relation Age of Onset   Hypertension Mother    Diabetes Mother     Social History   Socioeconomic History   Marital status: Single    Spouse name: Not on file   Number of children: Not on file   Years of education: Not on file   Highest education level: Not on file  Occupational History   Not on file  Tobacco Use   Smoking status: Never   Smokeless tobacco: Never  Vaping Use   Vaping Use: Never used  Substance and Sexual Activity   Alcohol use: Yes    Comment: occasional    Drug use: Yes    Types: Marijuana    Comment: once a week   Sexual activity: Yes    Birth control/protection: Condom  Other Topics Concern   Not on file  Social History Narrative   Not on file   Social Determinants of Health   Financial Resource Strain: Low Risk    Difficulty of Paying Living Expenses: Not hard at all  Food Insecurity: No Food Insecurity   Worried  About Running Out of Food in the Last Year: Never true   Ran Out of Food in the Last Year: Never true  Transportation Needs: No Transportation Needs   Lack of Transportation (Medical): No   Lack of Transportation (Non-Medical): No  Physical Activity: Insufficiently Active   Days of Exercise per Week: 3 days   Minutes of Exercise per Session: 30 min  Stress: Stress Concern Present   Feeling of Stress : To some extent  Social Connections: Moderately Integrated   Frequency of Communication with Friends and Family: More than three times a week   Frequency of Social Gatherings with Friends and Family: Once a week   Attends Religious Services: More than 4 times per year   Active Member of [employer] or Organizations: Yes   Attends Music therapist: More than 4 times per year    Marital Status: Never married  Human resources officer Violence: Not At Risk   Fear of Current or Ex-Partner: No   Emotionally Abused: No   Physically Abused: No   Sexually Abused: No    Outpatient Medications Prior to Visit  Medication Sig Dispense Refill   albuterol (PROVENTIL) (2.5 MG/3ML) 0.083% nebulizer solution USE 1 VIAL VIA NEBULIZER EVERY 6 HOURS AS NEEDED FOR WHEEZING OR SHORTNESS OF BREATH 75 mL 1   albuterol (VENTOLIN HFA) 108 (90 Base) MCG/ACT inhaler INHALE 2 PUFFS INTO THE LUNGS EVERY 6 HOURS FOR UP TO 90 DOSES AS NEEDED FOR WHEEZING 6.7 g 1   amLODipine (NORVASC) 10 MG tablet TAKE 1 TABLET(10 MG) BY MOUTH DAILY 90 tablet 3   baclofen (LIORESAL) 10 MG tablet Take 10 mg by mouth 2 (two) times daily as needed.     busPIRone (BUSPAR) 7.5 MG tablet Take 1 tablet (7.5 mg total) by mouth 3 (three) times daily. 60 tablet 2   cetirizine (ZYRTEC) 10 MG tablet TAKE 1 TABLET(10 MG) BY MOUTH DAILY AS NEEDED FOR ALLERGIES 30 tablet 2   Cholecalciferol (VITAMIN D3) 25 MCG (1000 UT) CAPS      cyclobenzaprine (FLEXERIL) 10 MG tablet Take 1 tablet (10 mg total) by mouth 3 (three) times daily as needed for muscle spasms. 20 tablet 0   esomeprazole (NEXIUM) 40 MG capsule TAKE 1 CAPSULE(40 MG) BY MOUTH DAILY 30 capsule 3   fluconazole (DIFLUCAN) 150 MG tablet fluconazole 150 mg tablet  TAKE 1 TABLET BY MOUTH AS DIRECTED AS NEEDED     fluticasone (FLONASE) 50 MCG/ACT nasal spray Place 2 sprays into both nostrils daily as needed. allergies 9.9 mL 11   hydrOXYzine (VISTARIL) 50 MG capsule Take as prescribed as AVS. 120 capsule 1   ibuprofen (ADVIL) 600 MG tablet Take 1 tablet (600 mg total) by mouth every 6 (six) hours as needed for moderate pain. 30 tablet 0   ibuprofen (ADVIL) 800 MG tablet Take 800 mg by mouth 3 (three) times daily.     LINZESS 72 MCG capsule Take 72 mcg by mouth daily.     LORazepam (ATIVAN) 1 MG tablet      minocycline (DYNACIN) 100 MG tablet minocycline 100 mg tablet  TAKE 1  TABLET BY MOUTH TWICE DAILY 1 HOUR BEFORE A MEAL AND 2 HOURS AFTER A MEAL     Multiple Vitamins-Minerals (MULTIVITAMINS THER. W/MINERALS) TABS Take 1 tablet by mouth daily.     nystatin ointment (MYCOSTATIN) Apply 1 application topically 2 (two) times daily. 30 g 1   omeprazole (PRILOSEC) 20 MG capsule      ondansetron (ZOFRAN ODT)  4 MG disintegrating tablet Take 1 tablet (4 mg total) by mouth every 8 (eight) hours as needed for nausea or vomiting. 20 tablet 0   oxyCODONE-acetaminophen (PERCOCET) 10-325 MG tablet Take 1 tablet by mouth every 8 (eight) hours as needed for pain.     tranexamic acid (LYSTEDA) 650 MG TABS tablet      vitamin C (ASCORBIC ACID) 500 MG tablet      Vitamin D, Ergocalciferol, (DRISDOL) 1.25 MG (50000 UNIT) CAPS capsule Take 1 capsule (50,000 Units total) by mouth every 7 (seven) days. 5 capsule 6   amoxicillin-clavulanate (AUGMENTIN) 875-125 MG tablet Take 1 tablet by mouth 2 (two) times daily. 20 tablet 0   amoxicillin-clavulanate (AUGMENTIN) 875-125 MG tablet      gabapentin (NEURONTIN) 300 MG capsule Take 300 mg by mouth 2 (two) times daily as needed.     No facility-administered medications prior to visit.    Allergies  Allergen Reactions   Ivp Dye [Iodinated Contrast Media]     Hives on hands and feet per pt 08/16/15 pt had scan w/ contrast and had a 4 hr premedication protocol done    ROS Review of Systems  HENT:  Positive for rhinorrhea.   Respiratory:         She has noticed that walking specific distances She uses the inhaler and nebulizer She is unable to walk long distances.   She feels like it related to wearing the mask   Cardiovascular:  Positive for chest pain (when the SOB gets worse.).  Neurological:  Positive for headaches (annual allergies).     Objective:    Physical Exam Constitutional:      Appearance: She is obese.  HENT:     Head: Normocephalic and atraumatic.     Nose: Nose normal.     Mouth/Throat:     Mouth: Mucous  membranes are moist.  Cardiovascular:     Rate and Rhythm: Normal rate and regular rhythm.     Pulses: Normal pulses.     Heart sounds: Normal heart sounds.  Pulmonary:     Effort: Pulmonary effort is normal.     Breath sounds: Normal breath sounds.  Abdominal:     Palpations: Abdomen is soft.  Musculoskeletal:        General: Normal range of motion.     Cervical back: Normal range of motion.  Skin:    General: Skin is warm and dry.     Capillary Refill: Capillary refill takes less than 2 seconds.  Neurological:     General: No focal deficit present.     Mental Status: She is alert and oriented to person, place, and time.    BP (!) 149/88    Pulse 84    Temp 98 F (36.7 C)    Ht _0  (1.702 m)    Wt 246 lb 6.4 oz (111.8 kg)    LMP 08/24/2015 Comment: irregular periods per pt 09/12/15   SpO2 98%    BMI 38.59 kg/m  Wt Readings from Last 3 Encounters:  03/19/21 246 lb 6.4 oz (111.8 kg)  12/15/20 241 lb (109.3 kg)  07/27/20 249 lb (112.9 kg)     Health Maintenance Due  Topic Date Due   COLONOSCOPY (Pts 45-42yr Insurance coverage will need to be confirmed)  Never done   MAMMOGRAM  05/13/2019    There are no preventive care reminders to display for this patient.  Lab Results  Component Value Date   TSH 0.866 06/08/2019  Lab Results  Component Value Date   WBC 6.8 12/15/2020   HGB 13.4 12/15/2020   HCT 40.4 12/15/2020   MCV 77 (L) 12/15/2020   PLT 239 12/15/2020   Lab Results  Component Value Date   NA 142 12/15/2020   K 3.9 12/15/2020   CO2 27 09/10/2019   GLUCOSE 94 12/15/2020   BUN 11 12/15/2020   CREATININE 1.06 (H) 12/15/2020   BILITOT 0.3 12/15/2020   ALKPHOS 62 12/15/2020   AST 33 12/15/2020   ALT 18 09/10/2019   PROT 7.7 12/15/2020   ALBUMIN 4.3 12/15/2020   CALCIUM 9.8 12/15/2020   ANIONGAP 10 09/10/2019   EGFR 63 12/15/2020   No results found for: CHOL No results found for: HDL No results found for: LDLCALC No results found for: TRIG No  results found for: CHOLHDL Lab Results  Component Value Date   HGBA1C 6.3 (A) 03/19/2021   HGBA1C 6.3 03/19/2021   HGBA1C 6.3 03/19/2021   HGBA1C 6.3 03/19/2021      Assessment & Plan:   Problem List Items Addressed This Visit       Cardiovascular and Mediastinum   Essential hypertension Persistent  Encouraged on going compliance with current medication regimen Encouraged home monitoring and recording BP <130/80 Eating a heart-healthy diet with less salt Encouraged regular physical activity  Recommend Weight loss     Relevant Medications   tranexamic acid (LYSTEDA) 650 MG TABS tablet   Other Relevant Orders   HgB A1c (Completed)   POCT URINALYSIS DIP (CLINITEK)     Respiratory   Asthma Persistent    Allergic rhinitis     Musculoskeletal and Integument   Hidradenitis suppurativa - Primary Persistent  Seen by dermatology in the past no real solution Refilled Augmentin     Relevant Medications   fluconazole (DIFLUCAN) 150 MG tablet     Other   Sarcoidosis Persistent follows up with pulmonology    Anxiety Worsening  Requesting ongoing treatment with benzo    Relevant Medications   LORazepam (ATIVAN) 1 MG tablet   Prediabetes Stable Consider home glucose monitoring Weight loss at least 5% of current body weight is can be achieved with lifestyle modification dietary changes and regular daily exercise Encourage blood pressure control goal <120/80 and maintaining total cholesterol <200 Follow-up every 3 to 6 months for reevaluation Education material provided    Relevant Orders   HgB A1c (Completed)   Class 2 severe obesity with serious comorbidity and body mass index (BMI) of 37.0 to 37.9 in adult, unspecified obesity type ([place])    Sickle cell trait ([place])    No orders of the defined types were placed in this encounter.   Follow-up: Return in about 4 months (around 07/17/2021) for Follow up HTN [id number].    Vevelyn Francois, NP

## 2021-03-20 LAB — COMP. METABOLIC PANEL (12)
AST: 19 IU/L (ref 0–40)
Albumin/Globulin Ratio: 1.2 (ref 1.2–2.2)
Albumin: 4.1 g/dL (ref 3.8–4.9)
Alkaline Phosphatase: 73 IU/L (ref 44–121)
BUN/Creatinine Ratio: 15 (ref 9–23)
BUN: 14 mg/dL (ref 6–24)
Bilirubin Total: 0.3 mg/dL (ref 0.0–1.2)
Calcium: 9.9 mg/dL (ref 8.7–10.2)
Chloride: 105 mmol/L (ref 96–106)
Creatinine, Ser: 0.93 mg/dL (ref 0.57–1.00)
Globulin, Total: 3.3 g/dL (ref 1.5–4.5)
Glucose: 124 mg/dL — ABNORMAL HIGH (ref 70–99)
Potassium: 3.8 mmol/L (ref 3.5–5.2)
Sodium: 144 mmol/L (ref 134–144)
Total Protein: 7.4 g/dL (ref 6.0–8.5)
eGFR: 73 mL/min/{1.73_m2} (ref >59)

## 2021-03-20 LAB — LIPID PANEL
Chol/HDL Ratio: 3.4 ratio (ref 0.0–4.4)
Cholesterol, Total: 144 mg/dL (ref 100–199)
HDL: 42 mg/dL (ref >39)
LDL Chol Calc (NIH): 75 mg/dL (ref 0–99)
Triglycerides: 157 mg/dL — ABNORMAL HIGH (ref 0–149)
VLDL Cholesterol Cal: 27 mg/dL (ref 5–40)

## 2021-03-21 LAB — URINE CULTURE

## 2021-04-20 ENCOUNTER — Other Ambulatory Visit: Payer: Self-pay | Admitting: Nurse Practitioner

## 2021-04-20 DIAGNOSIS — L732 Hidradenitis suppurativa: Secondary | ICD-10-CM

## 2021-04-20 MED ORDER — FLUCONAZOLE 150 MG PO TABS: 150.0000 mg | ORAL_TABLET | Freq: Once | ORAL | 0 refills | Status: DC

## 2021-04-20 MED ORDER — AMOXICILLIN-POT CLAVULANATE 875-125 MG PO TABS: 1.0000 | ORAL_TABLET | Freq: Two times a day (BID) | ORAL | 0 refills | Status: AC

## 2021-04-27 ENCOUNTER — Telehealth: Payer: Self-pay

## 2021-04-27 NOTE — Telephone Encounter (Signed)
Patient contacted to schedule mammogram.  ? ?LVM for pt to call back as soon as possible.  ? ? ?RE: Mobile Mammo event located at: ? ?[email or web address]  ?      ?[address]    ?[place] [address]    ? ?Date: April 7th  ? ?

## 2021-05-18 ENCOUNTER — Ambulatory Visit
Admission: RE | Admit: 2021-05-18 | Discharge: 2021-05-18 | Disposition: A | Discharge status: Home / Self Care | Payer: Medicare Other | Source: Ambulatory Visit | Attending: Nurse Practitioner | Admitting: Nurse Practitioner

## 2021-05-18 DIAGNOSIS — Z1231 Encounter for screening mammogram for malignant neoplasm of breast: Secondary | ICD-10-CM

## 2021-05-28 ENCOUNTER — Encounter: Payer: Self-pay | Admitting: Nurse Practitioner

## 2021-05-28 ENCOUNTER — Ambulatory Visit (INDEPENDENT_AMBULATORY_CARE_PROVIDER_SITE_OTHER): Payer: Medicare Other | Admitting: Nurse Practitioner

## 2021-05-28 VITALS — BP 146/96 | HR 78 | Temp 98.4°F | Ht 67.0 in | Wt 247.4 lb

## 2021-05-28 DIAGNOSIS — D860 Sarcoidosis of lung: Secondary | ICD-10-CM

## 2021-05-28 DIAGNOSIS — L02411 Cutaneous abscess of right axilla: Secondary | ICD-10-CM

## 2021-05-28 DIAGNOSIS — J4520 Mild intermittent asthma, uncomplicated: Secondary | ICD-10-CM

## 2021-05-28 DIAGNOSIS — D869 Sarcoidosis, unspecified: Secondary | ICD-10-CM

## 2021-05-28 DIAGNOSIS — L732 Hidradenitis suppurativa: Secondary | ICD-10-CM

## 2021-05-28 DIAGNOSIS — L0291 Cutaneous abscess, unspecified: Secondary | ICD-10-CM

## 2021-05-28 DIAGNOSIS — B379 Candidiasis, unspecified: Secondary | ICD-10-CM

## 2021-05-28 MED ORDER — NYSTATIN 100000 UNIT/GM EX OINT: 1.0000 "application " | TOPICAL_OINTMENT | Freq: Two times a day (BID) | CUTANEOUS | 1 refills | Status: DC

## 2021-05-28 MED ORDER — ALBUTEROL SULFATE HFA 108 (90 BASE) MCG/ACT IN AERS: INHALATION_SPRAY | RESPIRATORY_TRACT | 1 refills | Status: DC

## 2021-05-28 MED ORDER — AMOXICILLIN-POT CLAVULANATE 875-125 MG PO TABS: 1.0000 | ORAL_TABLET | Freq: Two times a day (BID) | ORAL | 0 refills | Status: DC

## 2021-05-28 NOTE — Progress Notes (Signed)
? ?Kenneth ?[place], [place]  [address] ?Phone:  [phone]   Fax:  [phone] ?Subjective:  ? Patient ID: Crystal Bullock, female    DOB: [birth year], 56 y.o.   MRN: [id number] ? ?Chief Complaint  ?Patient presents with  ? Follow-up  ?  Patient is here today for medication refills on her antibiotic for her boils and her zyrtec allergy medication.  ? ?HPI ?Crystal Bullock 56 y.o. female  has a past medical history of Anxiety (10/06/2015), Asthma, Atypical chest pain (10/06/2015), Chronic neck pain, Fibroid, Hypertension, Palpitations (10/06/2015), RBBB (10/06/2015), Sarcoidosis, Scoliosis, Seasonal allergies, Sickle cell trait ([place]), and Vitamin D deficiency (05/2019). To the [facility] for medication refill. ? ?States that she needs medication refill of Augmentin for boils under right arm and on left thigh. Endorses having boils x 1 wk. States that she has had them for several years, since delivery of first child, they occur every month prior to cycle. Has visited dermatologist and general surgery in the past. Was diagnosed with HS when she was 33. Indicates that Augmentin is the only antibiotic that works well with chronic abscess formation.  ? ?When questioned about elevated B/P ,states that B/P is elevated due to increased pain related to chronic condition and has not taken pain medication today. Currently compliant with all medications. Also requesting refill of nystatin and albuterol. ? ?Denies any other concerns today. Denies any fatigue, chest pain, shortness of breath, HA or dizziness. Denies any blurred vision, numbness or tingling. ? ? ?Past Medical History:  ?Diagnosis Date  ? Anxiety 10/06/2015  ? Asthma   ? Atypical chest pain 10/06/2015  ? Chronic neck pain   ? Fibroid   ? Hypertension   ? Palpitations 10/06/2015  ? RBBB 10/06/2015  ? Sarcoidosis   ? Scoliosis   ? Seasonal allergies   ? Sickle cell trait ([place])   ? Vitamin D deficiency 05/2019  ? ? ?Past Surgical  History:  ?Procedure Laterality Date  ? AXILLARY LYMPH NODE DISSECTION    ? CHOLECYSTECTOMY    ? ? ?Family History  ?Problem Relation Age of Onset  ? Hypertension Mother   ? Diabetes Mother   ? Breast cancer Neg Hx   ? ? ?Social History  ? ?Socioeconomic History  ? Marital status: Single  ?  Spouse name: Not on file  ? Number of children: Not on file  ? Years of education: Not on file  ? Highest education level: Not on file  ?Occupational History  ? Not on file  ?Tobacco Use  ? Smoking status: Never  ? Smokeless tobacco: Never  ?Vaping Use  ? Vaping Use: Never used  ?Substance and Sexual Activity  ? Alcohol use: Yes  ?  Comment: occasional   ? Drug use: Yes  ?  Types: Marijuana  ?  Comment: once a week  ? Sexual activity: Yes  ?  Birth control/protection: Condom  ?Other Topics Concern  ? Not on file  ?Social History Narrative  ? Not on file  ? ?Social Determinants of Health  ? ?Financial Resource Strain: Low Risk   ? Difficulty of Paying Living Expenses: Not hard at all  ?Food Insecurity: No Food Insecurity  ? Worried About Charity fundraiser in the Last Year: Never true  ? Ran Out of Food in the Last Year: Never true  ?Transportation Needs: No Transportation Needs  ? Lack of Transportation (Medical): No  ? Lack of Transportation (Non-Medical):  No  ?Physical Activity: Insufficiently Active  ? Days of Exercise per Week: 3 days  ? Minutes of Exercise per Session: 30 min  ?Stress: Stress Concern Present  ? Feeling of Stress : To some extent  ?Social Connections: Moderately Integrated  ? Frequency of Communication with Friends and Family: More than three times a week  ? Frequency of Social Gatherings with Friends and Family: Once a week  ? Attends Religious Services: More than 4 times per year  ? Active Member of Clubs or Organizations: Yes  ? Attends Archivist Meetings: More than 4 times per year  ? Marital Status: Never married  ?Intimate Partner Violence: Not At Risk  ? Fear of Current or Ex-Partner: No   ? Emotionally Abused: No  ? Physically Abused: No  ? Sexually Abused: No  ? ? ?Outpatient Medications Prior to Visit  ?Medication Sig Dispense Refill  ? albuterol (PROVENTIL) (2.5 MG/3ML) 0.083% nebulizer solution USE 1 VIAL VIA NEBULIZER EVERY 6 HOURS AS NEEDED FOR WHEEZING OR SHORTNESS OF BREATH 75 mL 1  ? amLODipine (NORVASC) 10 MG tablet TAKE 1 TABLET(10 MG) BY MOUTH DAILY 90 tablet 3  ? baclofen (LIORESAL) 10 MG tablet Take 10 mg by mouth 2 (two) times daily as needed.    ? busPIRone (BUSPAR) 7.5 MG tablet Take 1 tablet (7.5 mg total) by mouth 3 (three) times daily. 60 tablet 2  ? cetirizine (ZYRTEC) 10 MG tablet TAKE 1 TABLET(10 MG) BY MOUTH DAILY AS NEEDED FOR ALLERGIES 30 tablet 2  ? Cholecalciferol (VITAMIN D3) 25 MCG (1000 UT) CAPS     ? cyclobenzaprine (FLEXERIL) 10 MG tablet Take 1 tablet (10 mg total) by mouth 3 (three) times daily as needed for muscle spasms. 20 tablet 0  ? esomeprazole (NEXIUM) 40 MG capsule TAKE 1 CAPSULE(40 MG) BY MOUTH DAILY 30 capsule 3  ? fluticasone (FLONASE) 50 MCG/ACT nasal spray Place 2 sprays into both nostrils daily as needed. allergies 9.9 mL 11  ? hydrOXYzine (VISTARIL) 50 MG capsule Take as prescribed as AVS. 120 capsule 1  ? ibuprofen (ADVIL) 600 MG tablet Take 1 tablet (600 mg total) by mouth every 6 (six) hours as needed for moderate pain. 30 tablet 0  ? ibuprofen (ADVIL) 800 MG tablet Take 800 mg by mouth 3 (three) times daily.    ? LINZESS 72 MCG capsule Take 72 mcg by mouth daily.    ? LORazepam (ATIVAN) 1 MG tablet     ? minocycline (DYNACIN) 100 MG tablet minocycline 100 mg tablet ? TAKE 1 TABLET BY MOUTH TWICE DAILY 1 HOUR BEFORE A MEAL AND 2 HOURS AFTER A MEAL    ? Multiple Vitamins-Minerals (MULTIVITAMINS THER. W/MINERALS) TABS Take 1 tablet by mouth daily.    ? omeprazole (PRILOSEC) 20 MG capsule     ? ondansetron (ZOFRAN ODT) 4 MG disintegrating tablet Take 1 tablet (4 mg total) by mouth every 8 (eight) hours as needed for nausea or vomiting. 20 tablet 0  ?  oxyCODONE-acetaminophen (PERCOCET) 10-325 MG tablet Take 1 tablet by mouth every 8 (eight) hours as needed for pain.    ? tranexamic acid (LYSTEDA) 650 MG TABS tablet     ? vitamin C (ASCORBIC ACID) 500 MG tablet     ? Vitamin D, Ergocalciferol, (DRISDOL) 1.25 MG (50000 UNIT) CAPS capsule Take 1 capsule (50,000 Units total) by mouth every 7 (seven) days. 5 capsule 6  ? albuterol (VENTOLIN HFA) 108 (90 Base) MCG/ACT inhaler INHALE 2 PUFFS INTO  THE LUNGS EVERY 6 HOURS FOR UP TO 90 DOSES AS NEEDED FOR WHEEZING 6.7 g 1  ? nystatin ointment (MYCOSTATIN) Apply 1 application topically 2 (two) times daily. 30 g 1  ? ?No facility-administered medications prior to visit.  ? ? ?Allergies  ?Allergen Reactions  ? Iodinated Contrast Media Rash  ?  Hives on hands and feet per pt ?08/16/15 pt had scan w/ contrast and had a 4 hr premedication protocol done  ? ? ?Review of Systems  ?Constitutional:  Negative for chills, fever and malaise/fatigue.  ?Respiratory:  Negative for cough and shortness of breath.   ?Cardiovascular:  Negative for chest pain, palpitations and leg swelling.  ?Gastrointestinal:  Negative for abdominal pain, blood in stool, constipation, diarrhea, nausea and vomiting.  ?Skin:  Negative for itching and rash.  ?     See HPI  ?Neurological: Negative.   ?Psychiatric/Behavioral:  Negative for depression. The patient is not nervous/anxious.   ?All other systems reviewed and are negative. ? ?   ?Objective:  ?  ?Physical Exam ?Constitutional:   ?   General: She is not in acute distress. ?   Appearance: Normal appearance. She is obese.  ?HENT:  ?   Head: Normocephalic.  ?Cardiovascular:  ?   Rate and Rhythm: Normal rate and regular rhythm.  ?   Pulses: Normal pulses.  ?   Heart sounds: Normal heart sounds.  ?   Comments: No obvious peripheral edema ?Pulmonary:  ?   Effort: Pulmonary effort is normal.  ?   Breath sounds: Normal breath sounds.  ?Musculoskeletal:     ?   General: No swelling, tenderness, deformity or signs of  injury. Normal range of motion.  ?   Right lower leg: No edema.  ?   Left lower leg: No edema.  ?Skin: ?   General: Skin is warm and dry.  ?   Capillary Refill: Capillary refill takes less than 2 sec

## 2021-05-28 NOTE — Patient Instructions (Signed)
You were seen today in the [facility] for medication refill.  You were prescribed medications, please take as directed. Please follow up in 3 mths for reevaluation of chronic illness. ?

## 2021-06-04 ENCOUNTER — Ambulatory Visit: Payer: Self-pay | Admitting: Nurse Practitioner

## 2021-06-29 ENCOUNTER — Telehealth: Payer: Self-pay

## 2021-06-29 ENCOUNTER — Other Ambulatory Visit: Payer: Self-pay | Admitting: Nurse Practitioner

## 2021-06-29 DIAGNOSIS — L732 Hidradenitis suppurativa: Secondary | ICD-10-CM

## 2021-06-29 MED ORDER — AMOXICILLIN-POT CLAVULANATE 875-125 MG PO TABS: 1.0000 | ORAL_TABLET | Freq: Two times a day (BID) | ORAL | 0 refills | Status: DC

## 2021-06-29 NOTE — Telephone Encounter (Signed)
amoxicillin 

## 2021-07-03 ENCOUNTER — Other Ambulatory Visit: Payer: Self-pay | Admitting: Nurse Practitioner

## 2021-07-03 ENCOUNTER — Telehealth: Payer: Self-pay | Admitting: Nurse Practitioner

## 2021-07-03 DIAGNOSIS — B379 Candidiasis, unspecified: Secondary | ICD-10-CM

## 2021-07-03 MED ORDER — NYSTATIN 100000 UNIT/GM EX OINT: 1.0000 "application " | TOPICAL_OINTMENT | Freq: Two times a day (BID) | CUTANEOUS | 1 refills | Status: DC

## 2021-07-03 NOTE — Telephone Encounter (Signed)
Patient requests refill on Nystatin ointment. Please send to [facility] on [address]

## 2021-07-13 ENCOUNTER — Other Ambulatory Visit: Payer: Self-pay

## 2021-07-13 DIAGNOSIS — I10 Essential (primary) hypertension: Secondary | ICD-10-CM

## 2021-07-13 MED ORDER — AMLODIPINE BESYLATE 10 MG PO TABS: ORAL_TABLET | ORAL | 3 refills | Status: DC

## 2021-07-19 ENCOUNTER — Ambulatory Visit: Payer: Self-pay | Admitting: Nurse Practitioner

## 2021-07-20 ENCOUNTER — Emergency Department (HOSPITAL_COMMUNITY)
Admission: EM | Admit: 2021-07-20 | Discharge: 2021-07-20 | Disposition: A | Discharge status: Home / Self Care | Payer: Medicare Other | Attending: Emergency Medicine | Admitting: Emergency Medicine

## 2021-07-20 ENCOUNTER — Emergency Department (HOSPITAL_COMMUNITY): Payer: Medicare Other

## 2021-07-20 ENCOUNTER — Encounter (HOSPITAL_COMMUNITY): Payer: Self-pay | Admitting: Emergency Medicine

## 2021-07-20 DIAGNOSIS — J45909 Unspecified asthma, uncomplicated: Secondary | ICD-10-CM | POA: Diagnosis not present

## 2021-07-20 DIAGNOSIS — R079 Chest pain, unspecified: Secondary | ICD-10-CM | POA: Diagnosis not present

## 2021-07-20 DIAGNOSIS — Z79899 Other long term (current) drug therapy: Secondary | ICD-10-CM | POA: Diagnosis not present

## 2021-07-20 DIAGNOSIS — L0291 Cutaneous abscess, unspecified: Secondary | ICD-10-CM

## 2021-07-20 DIAGNOSIS — L02212 Cutaneous abscess of back [any part, except buttock]: Secondary | ICD-10-CM | POA: Diagnosis present

## 2021-07-20 DIAGNOSIS — N9489 Other specified conditions associated with female genital organs and menstrual cycle: Secondary | ICD-10-CM | POA: Insufficient documentation

## 2021-07-20 DIAGNOSIS — I10 Essential (primary) hypertension: Secondary | ICD-10-CM | POA: Diagnosis not present

## 2021-07-20 DIAGNOSIS — E876 Hypokalemia: Secondary | ICD-10-CM | POA: Diagnosis not present

## 2021-07-20 DIAGNOSIS — Z7951 Long term (current) use of inhaled steroids: Secondary | ICD-10-CM | POA: Insufficient documentation

## 2021-07-20 LAB — CBC
HCT: 35.8 % — ABNORMAL LOW (ref 36.0–46.0)
Hemoglobin: 12.3 g/dL (ref 12.0–15.0)
MCH: 25.5 pg — ABNORMAL LOW (ref 26.0–34.0)
MCHC: 34.4 g/dL (ref 30.0–36.0)
MCV: 74.1 fL — ABNORMAL LOW (ref 80.0–100.0)
Platelets: 276 10*3/uL (ref 150–400)
RBC: 4.83 MIL/uL (ref 3.87–5.11)
RDW: 14.3 % (ref 11.5–15.5)
WBC: 11.5 10*3/uL — ABNORMAL HIGH (ref 4.0–10.5)
nRBC: 0 % (ref 0.0–0.2)

## 2021-07-20 LAB — TROPONIN I (HIGH SENSITIVITY)
Troponin I (High Sensitivity): 5 ng/L (ref <18)
Troponin I (High Sensitivity): 5 ng/L (ref <18)

## 2021-07-20 LAB — I-STAT BETA HCG BLOOD, ED (MC, WL, AP ONLY): I-stat hCG, quantitative: <5 m[IU]/mL (ref <5)

## 2021-07-20 LAB — BASIC METABOLIC PANEL
Anion gap: 12 (ref 5–15)
BUN: 17 mg/dL (ref 6–20)
CO2: 23 mmol/L (ref 22–32)
Calcium: 9.2 mg/dL (ref 8.9–10.3)
Chloride: 101 mmol/L (ref 98–111)
Creatinine, Ser: 1.13 mg/dL — ABNORMAL HIGH (ref 0.44–1.00)
GFR, Estimated: 58 mL/min — ABNORMAL LOW (ref >60)
Glucose, Bld: 223 mg/dL — ABNORMAL HIGH (ref 70–99)
Potassium: 3.1 mmol/L — ABNORMAL LOW (ref 3.5–5.1)
Sodium: 136 mmol/L (ref 135–145)

## 2021-07-20 MED ORDER — LIDOCAINE-EPINEPHRINE (PF) 2 %-1:200000 IJ SOLN
10.0000 mL | Freq: Once | INTRAMUSCULAR | Status: AC
  Administered 2021-07-20: 10 mL
  Filled 2021-07-20: qty 20

## 2021-07-20 MED ORDER — POTASSIUM CHLORIDE CRYS ER 20 MEQ PO TBCR
40.0000 meq | EXTENDED_RELEASE_TABLET | Freq: Once | ORAL | Status: AC
  Administered 2021-07-20: 40 meq via ORAL
  Filled 2021-07-20: qty 2

## 2021-07-20 NOTE — ED Provider Notes (Signed)
[facility] EMERGENCY DEPARTMENT Provider Note   CSN: [id number] Arrival date & time: 07/20/21  0241     History  Chief Complaint  Patient presents with   Abscess   Chest Pain    Crystal Bullock is a 56 y.o. female.  56 year old female with past medical history of hidradenitis suppurativa, hypertension, asthma, sarcoidosis presents today for evaluation of chest pain, as well as an abscess on her back.  She states she has recurrent abscesses in this location as well as other locations.  She is without fever, chills, or other complaints.  She is concerned that the abscess is causing her chest pain.  She is having some shortness of breath with her chest pain, the pain does not radiate anywhere else, denies lightheadedness, palpitations, nausea, or vomiting.  No prior cardiac history with the exception of hypertension.  The history is provided by the patient. No language interpreter was used.       Home Medications Prior to Admission medications   Medication Sig Start Date End Date Taking? Authorizing Provider  albuterol (PROVENTIL) (2.5 MG/3ML) 0.083% nebulizer solution USE 1 VIAL VIA NEBULIZER EVERY 6 HOURS AS NEEDED FOR WHEEZING OR SHORTNESS OF BREATH 03/19/21   Vevelyn Francois, NP  albuterol (VENTOLIN HFA) 108 (90 Base) MCG/ACT inhaler INHALE 2 PUFFS INTO THE LUNGS EVERY 6 HOURS FOR UP TO 90 DOSES AS NEEDED FOR WHEEZING 05/28/21   Passmore, Jake Church I, NP  amLODipine (NORVASC) 10 MG tablet TAKE 1 TABLET(10 MG) BY MOUTH DAILY 07/13/21   Bo Merino I, NP  amoxicillin-clavulanate (AUGMENTIN) 875-125 MG tablet Take 1 tablet by mouth 2 (two) times daily. 06/29/21   Passmore, Jake Church I, NP  baclofen (LIORESAL) 10 MG tablet Take 10 mg by mouth 2 (two) times daily as needed. 03/02/21   [provider]  busPIRone (BUSPAR) 7.5 MG tablet Take 1 tablet (7.5 mg total) by mouth 3 (three) times daily. 08/24/18   Lanae Boast, FNP  cetirizine (ZYRTEC) 10 MG tablet TAKE 1  TABLET(10 MG) BY MOUTH DAILY AS NEEDED FOR ALLERGIES 03/19/21   Vevelyn Francois, NP  Cholecalciferol (VITAMIN D3) 25 MCG (1000 UT) CAPS     [provider]  cyclobenzaprine (FLEXERIL) 10 MG tablet Take 1 tablet (10 mg total) by mouth 3 (three) times daily as needed for muscle spasms. 02/11/21   Domenic Moras, PA-C  esomeprazole (NEXIUM) 40 MG capsule TAKE 1 CAPSULE(40 MG) BY MOUTH DAILY 02/26/20   Vevelyn Francois, NP  fluticasone [facility]) 50 MCG/ACT nasal spray Place 2 sprays into both nostrils daily as needed. allergies 04/18/20   Azzie Glatter, FNP  hydrOXYzine (VISTARIL) 50 MG capsule Take as prescribed as AVS. 04/07/19   Vevelyn Francois, NP  ibuprofen (ADVIL) 600 MG tablet Take 1 tablet (600 mg total) by mouth every 6 (six) hours as needed for moderate pain. 02/11/21   Domenic Moras, PA-C  ibuprofen (ADVIL) 800 MG tablet Take 800 mg by mouth 3 (three) times daily. 03/02/21   [provider]  LINZESS 72 MCG capsule Take 72 mcg by mouth daily. 12/06/20   [provider]  LORazepam (ATIVAN) 1 MG tablet     [provider]  minocycline (DYNACIN) 100 MG tablet minocycline 100 mg tablet  TAKE 1 TABLET BY MOUTH TWICE DAILY 1 HOUR BEFORE A MEAL AND 2 HOURS AFTER A MEAL    [provider]  Multiple Vitamins-Minerals (MULTIVITAMINS THER. W/MINERALS) TABS Take 1 tablet by mouth daily.  [provider]  nystatin ointment (MYCOSTATIN) Apply 1 application. topically 2 (two) times daily. 07/03/21   Passmore, Jake Church I, NP  omeprazole (PRILOSEC) 20 MG capsule     [provider]  ondansetron (ZOFRAN ODT) 4 MG disintegrating tablet Take 1 tablet (4 mg total) by mouth every 8 (eight) hours as needed for nausea or vomiting. 04/07/19   Vevelyn Francois, NP  oxyCODONE-acetaminophen (PERCOCET) 10-325 MG tablet Take 1 tablet by mouth every 8 (eight) hours as needed for pain.    [provider]  tranexamic acid (LYSTEDA) 650 MG TABS tablet     [provider]  vitamin C (ASCORBIC ACID) 500 MG tablet     [provider]  Vitamin D, Ergocalciferol, (DRISDOL) 1.25 MG (50000 UNIT) CAPS capsule Take 1 capsule (50,000 Units total) by mouth every 7 (seven) days. 04/10/20   Azzie Glatter, FNP      Allergies    Iodinated contrast media    Review of Systems   Review of Systems  Constitutional:  Negative for chills and fever.  Respiratory:  Positive for shortness of breath.   Cardiovascular:  Positive for chest pain. Negative for palpitations and leg swelling.  Skin:  Positive for wound.  Neurological:  Negative for light-headedness.  All other systems reviewed and are negative.   Physical Exam Updated Vital Signs BP (!) 159/94   Pulse 95   Temp 98.1 F (36.7 C) (Oral)   Resp 20   LMP 08/24/2015 Comment: irregular periods per pt 09/12/15  SpO2 97%  Physical Exam Vitals and nursing note reviewed.  Constitutional:      General: She is not in acute distress.    Appearance: Normal appearance. She is not ill-appearing.  HENT:     Head: Normocephalic and atraumatic.     Nose: Nose normal.  Eyes:     General: No scleral icterus.    Extraocular Movements: Extraocular movements intact.     Conjunctiva/sclera: Conjunctivae normal.  Cardiovascular:     Rate and Rhythm: Normal rate and regular rhythm.     Pulses: Normal pulses.  Pulmonary:     Effort: Pulmonary effort is normal. No respiratory distress.     Breath sounds: Normal breath sounds. No wheezing or rales.  Abdominal:     General: There is no distension.     Tenderness: There is no abdominal tenderness.  Musculoskeletal:        General: Normal range of motion.     Cervical back: Normal range of motion.  Skin:    General: Skin is warm and dry.     Comments: 1 cm abscess noted to mid back.  Area of fluctuance present.  Without surrounding erythema.  Neurological:     General: No focal deficit present.     Mental Status: She is alert. Mental status is at  baseline.     ED Results / Procedures / Treatments   Labs (all labs ordered are listed, but only abnormal results are displayed) Labs Reviewed  BASIC METABOLIC PANEL - Abnormal; Notable for the following components:      Result Value   Potassium 3.1 (*)    Glucose, Bld 223 (*)    Creatinine, Ser 1.13 (*)    GFR, Estimated 58 (*)    All other components within normal limits  CBC - Abnormal; Notable for the following components:   WBC 11.5 (*)    HCT 35.8 (*)    MCV 74.1 (*)    [facility]  25.5 (*)    All other components within normal limits  I-STAT BETA HCG BLOOD, ED ([facility], [facility], [facility] ONLY)  TROPONIN I (HIGH SENSITIVITY)  TROPONIN I (HIGH SENSITIVITY)    EKG EKG Interpretation  Date/Time:  Friday July 20 2021 03:01:26 EDT Ventricular Rate:  89 PR Interval:  160 QRS Duration: 152 QT Interval:  442 QTC Calculation: 537 R Axis:   96 Text Interpretation: Normal sinus rhythm Right bundle branch block No acute changes When compared with ECG of 02-Feb-2021 18:02, PREVIOUS ECG IS PRESENT Confirmed by Addison Lank [phone]) on 07/20/2021 3:05:00 AM  Radiology DG Chest 2 View  Result Date: 07/20/2021 CLINICAL DATA:  Chest pain, shortness of breath. EXAM: CHEST - 2 VIEW COMPARISON:  None Available. FINDINGS: The heart size and mediastinal contours are within normal limits. No consolidation, effusion, or pneumothorax. No acute osseous abnormality. IMPRESSION: No active cardiopulmonary disease. Electronically Signed   By: Brett Fairy M.D.   On: 07/20/2021 03:46    Procedures .Marland KitchenIncision and Drainage  Date/Time: 07/20/2021 6:17 AM  Performed by: Evlyn Courier, PA-C Authorized by: Evlyn Courier, PA-C   Consent:    Consent obtained:  Verbal   Consent given by:  Patient   Risks discussed:  Bleeding, incomplete drainage, pain and damage to other organs   Alternatives discussed:  No treatment Universal protocol:    Procedure explained and questions answered to patient or proxy's satisfaction: yes      Relevant documents present and verified: yes     Test results available : yes     Patient identity confirmed:  Verbally with patient Location:    Type:  Abscess   Location:  Trunk   Trunk location:  Back Pre-procedure details:    Skin preparation:  Betadine Anesthesia:    Anesthesia method:  Local infiltration   Local anesthetic:  Lidocaine 2% WITH epi Procedure type:    Complexity:  Simple Procedure details:    Incision types:  Single straight   Incision depth:  Subcutaneous   Wound management:  Probed and deloculated, irrigated with saline and extensive cleaning   Drainage:  Purulent   Drainage amount:  Moderate   Wound treatment:  Wound left open   Packing materials:  None Post-procedure details:    Procedure completion:  Tolerated well, no immediate complications     Medications Ordered in ED Medications - No data to display  ED Course/ Medical Decision Making/ A&P                           Medical Decision Making Amount and/or Complexity of Data Reviewed Labs: ordered. Radiology: ordered.  Risk Prescription drug management.   Medical Decision Making / ED Course   This patient presents to the ED for concern of chest pain, abscess, this involves an extensive number of treatment options, and is a complaint that carries with it a high risk of complications and morbidity.  The differential diagnosis includes abscess, ACS, pneumonia, PE, MSK pain  MDM: 56 year old female presents with chest pain, and abscess to back.  Abscess has an area of fluctuance and it is ready to be drained.  Patient is without significant cardiac disease with the exception of hypertension.  CBC with mild leukocytosis at 11.5, without anemia.  BMP shows hypokalemia with potassium of 3.1 glucose elevated at 223, and creatinine of 1.13 otherwise without acute findings.  Initial troponin of 5.  Chest x-ray without acute cardiopulmonary process.  EKG without acute  ischemic changes. Repeat troponin  pending.  Abscess incised and drained.  Repeat troponin flat.  Doubt ACS.  Patient following drainage reports improvement in pain overall including chest pain.  Patient is on antibiotic for the abscess that was drained which she just started 3 days ago.  She is on Augmentin.  Discussed continuing this antibiotic and following up with her PCP.  Return precautions discussed.  Patient voices understanding and is in agreement with plan.   Lab Tests: -I ordered, reviewed, and interpreted labs.   The pertinent results include:   Labs Reviewed  BASIC METABOLIC PANEL - Abnormal; Notable for the following components:      Result Value   Potassium 3.1 (*)    Glucose, Bld 223 (*)    Creatinine, Ser 1.13 (*)    GFR, Estimated 58 (*)    All other components within normal limits  CBC - Abnormal; Notable for the following components:   WBC 11.5 (*)    HCT 35.8 (*)    MCV 74.1 (*)    MCH 25.5 (*)    All other components within normal limits  I-STAT BETA HCG BLOOD, ED ([facility], [facility], [facility] ONLY)  TROPONIN I (HIGH SENSITIVITY)  TROPONIN I (HIGH SENSITIVITY)      EKG  EKG Interpretation  Date/Time:  Friday July 20 2021 03:01:26 EDT Ventricular Rate:  89 PR Interval:  160 QRS Duration: 152 QT Interval:  442 QTC Calculation: 537 R Axis:   96 Text Interpretation: Normal sinus rhythm Right bundle branch block No acute changes When compared with ECG of 02-Feb-2021 18:02, PREVIOUS ECG IS PRESENT Confirmed by Addison Lank [phone]) on 07/20/2021 3:05:00 AM         Imaging Studies ordered: I ordered imaging studies including chest x-ray I independently visualized and interpreted imaging. I agree with the radiologist interpretation   Medicines ordered and prescription drug management: No orders of the defined types were placed in this encounter.   -I have reviewed the patients home medicines and have made adjustments as needed  Reevaluation: After the interventions noted above, I reevaluated the  patient and found that they have :improved  Co morbidities that complicate the patient evaluation  Past Medical History:  Diagnosis Date   Anxiety 10/06/2015   Asthma    Atypical chest pain 10/06/2015   Chronic neck pain    Fibroid    Hypertension    Palpitations 10/06/2015   RBBB 10/06/2015   Sarcoidosis    Scoliosis    Seasonal allergies    Sickle cell trait ([place])    Vitamin D deficiency 05/2019      Dispostion: Patient reports improvement in symptoms overall.  Troponin negative.  Doubt ACS.  Potassium repleted with 40 mEq p.o.  Discussed follow-up with PCP.  Patient voices understanding and is in agreement with plan.  Final Clinical Impression(s) / ED Diagnoses Final diagnoses:  None    Rx / DC Orders ED Discharge Orders     None         Evlyn Courier, PA-C 07/20/21 [id number]    Fatima Blank, MD 07/20/21 [phone]

## 2021-07-20 NOTE — ED Triage Notes (Signed)
Pt also endorses pain to center/eft side of chest. States that it feels like pressure and causes pain with breathing. Denies any nausea or vomiting.

## 2021-07-20 NOTE — ED Triage Notes (Signed)
Pt reported to ED with c/o abscess on back that has been present since Monday. Reports hx of recurrent abscesses.

## 2021-07-20 NOTE — Discharge Instructions (Addendum)
Your work-up today was reassuring from the standpoint of your chest pain.  No concerning cause of your chest pain was identified.  Your abscess was drained in the emergency room.  You reported improvement in your chest pain following drainage of the abscess.  Recommend you follow-up with your primary care provider to have this site reevaluated and for further work-up and management of your chest pain.  If you have any concerning symptoms please return to the emergency room.  Continue taking the Augmentin that was previously prescribed to you 3 days ago.  If you have any worsening symptoms please return to the emergency room.

## 2021-07-21 ENCOUNTER — Emergency Department (HOSPITAL_COMMUNITY): Payer: Medicare Other

## 2021-07-21 ENCOUNTER — Emergency Department (HOSPITAL_COMMUNITY)
Admission: EM | Admit: 2021-07-21 | Discharge: 2021-07-22 | Disposition: A | Discharge status: Home / Self Care | Payer: Medicare Other | Attending: Emergency Medicine | Admitting: Emergency Medicine

## 2021-07-21 ENCOUNTER — Encounter (HOSPITAL_COMMUNITY): Payer: Self-pay | Admitting: Emergency Medicine

## 2021-07-21 ENCOUNTER — Other Ambulatory Visit: Payer: Self-pay

## 2021-07-21 DIAGNOSIS — R079 Chest pain, unspecified: Secondary | ICD-10-CM | POA: Insufficient documentation

## 2021-07-21 DIAGNOSIS — I10 Essential (primary) hypertension: Secondary | ICD-10-CM | POA: Insufficient documentation

## 2021-07-21 DIAGNOSIS — J45909 Unspecified asthma, uncomplicated: Secondary | ICD-10-CM | POA: Insufficient documentation

## 2021-07-21 DIAGNOSIS — R1012 Left upper quadrant pain: Secondary | ICD-10-CM | POA: Diagnosis not present

## 2021-07-21 LAB — BASIC METABOLIC PANEL
Anion gap: 10 (ref 5–15)
BUN: 14 mg/dL (ref 6–20)
CO2: 25 mmol/L (ref 22–32)
Calcium: 9.3 mg/dL (ref 8.9–10.3)
Chloride: 101 mmol/L (ref 98–111)
Creatinine, Ser: 0.91 mg/dL (ref 0.44–1.00)
GFR, Estimated: >60 mL/min (ref >60)
Glucose, Bld: 160 mg/dL — ABNORMAL HIGH (ref 70–99)
Potassium: 3.2 mmol/L — ABNORMAL LOW (ref 3.5–5.1)
Sodium: 136 mmol/L (ref 135–145)

## 2021-07-21 LAB — CBC
HCT: 34.9 % — ABNORMAL LOW (ref 36.0–46.0)
Hemoglobin: 12.3 g/dL (ref 12.0–15.0)
MCH: 25.8 pg — ABNORMAL LOW (ref 26.0–34.0)
MCHC: 35.2 g/dL (ref 30.0–36.0)
MCV: 73.2 fL — ABNORMAL LOW (ref 80.0–100.0)
Platelets: 289 10*3/uL (ref 150–400)
RBC: 4.77 MIL/uL (ref 3.87–5.11)
RDW: 14.3 % (ref 11.5–15.5)
WBC: 9 10*3/uL (ref 4.0–10.5)
nRBC: 0 % (ref 0.0–0.2)

## 2021-07-21 LAB — TROPONIN I (HIGH SENSITIVITY)
Troponin I (High Sensitivity): 6 ng/L (ref <18)
Troponin I (High Sensitivity): 6 ng/L (ref <18)

## 2021-07-21 NOTE — ED Provider Triage Note (Signed)
Emergency Medicine Provider Triage Evaluation Note  Crystal Bullock , a 56 y.o. female  was evaluated in triage.  Pt complains of chest pain x 5 days.  Described as a dull sensation to the substernal area without any radiation.  Similar episodes in the past when she was diagnosed with fatty liver.  Currently taking antibiotics along with ibuprofen or Tylenol to help with pain control from her boil and chest pain.  There has not been any improvement in symptoms.  Exacerbated with deep inspiration and ambulation.  No prior history of CAD, no family history of CAD, no prior history of blood clots.  Review of Systems  Positive: Pain, shortness of breath Negative: Leg swelling, fever, cough  Physical Exam  LMP 08/24/2015 Comment: irregular periods per pt 09/12/15 Gen:   Awake, no distress   Resp:  Normal effort  MSK:   Moves extremities without difficulty  Other:    Medical Decision Making  Medically screening exam initiated at 7:32 PM.  Appropriate orders placed.  Crystal Bullock was informed that the remainder of the evaluation will be completed by another provider, this initial triage assessment does not replace that evaluation, and the importance of remaining in the ED until their evaluation is complete.     Janeece Fitting, PA-C 07/21/21 [id number]

## 2021-07-21 NOTE — ED Triage Notes (Signed)
Pt reported to ED with c/o pain to entire left side of chest/ Pt states pain has been ongoing and is accompanied by shortness of breath. Pt endorses nausea and feeling lightheaded, stating she feels that she is about to faint.

## 2021-07-22 DIAGNOSIS — R079 Chest pain, unspecified: Secondary | ICD-10-CM | POA: Diagnosis not present

## 2021-07-22 MED ORDER — SUCRALFATE 1 G PO TABS: 1.0000 g | ORAL_TABLET | Freq: Four times a day (QID) | ORAL | 0 refills | Status: DC | PRN

## 2021-07-22 MED ORDER — ALUM & MAG HYDROXIDE-SIMETH 200-200-20 MG/5ML PO SUSP
30.0000 mL | Freq: Once | ORAL | Status: AC
  Administered 2021-07-22: 30 mL via ORAL
  Filled 2021-07-22: qty 30

## 2021-07-22 NOTE — ED Notes (Signed)
Patient verbalizes understanding of discharge instructions. Opportunity for questioning and answers were provided. Armband removed by staff, pt discharged from ED via wheelchair.  

## 2021-07-22 NOTE — Discharge Instructions (Signed)
You were evaluated in the Emergency Department and after careful evaluation, we did not find any emergent condition requiring admission or further testing in the hospital.  Your exam/testing today is overall reassuring.  Symptoms seem to be due to your acid reflux.  Recommend taking your Nexium medication daily to prevent pain.  You can use the Carafate medication as needed for immediate relief.  Please return to the Emergency Department if you experience any worsening of your condition.   Thank you for allowing [place] to be a part of your care.

## 2021-07-22 NOTE — ED Provider Notes (Signed)
[facility] Emergency Department Provider Note MRN:  [id number]  Arrival date & time: 07/22/21     Chief Complaint   Chest and abdominal pain History of Present Illness   Crystal Bullock is a 56 y.o. year-old female with a history of asthma, hypertension, sarcoidosis presenting to the ED with chief complaint of chest and abdominal pain.  Patient explains that for the past 3 to 4 days she has been having pain to the left upper quadrant abdomen radiating into the chest.  Started immediately after drinking a cup of coffee with cream.  Feels very similar to her acid reflux in the past.  Denies shortness of breath except when the pain is present.  No leg pain or swelling recently, no history of blood clots.  Review of Systems  A thorough review of systems was obtained and all systems are negative except as noted in the HPI and PMH.   Patient's Health History    Past Medical History:  Diagnosis Date   Anxiety 10/06/2015   Asthma    Atypical chest pain 10/06/2015   Chronic neck pain    Fibroid    Hypertension    Palpitations 10/06/2015   RBBB 10/06/2015   Sarcoidosis    Scoliosis    Seasonal allergies    Sickle cell trait ([place])    Vitamin D deficiency 05/2019    Past Surgical History:  Procedure Laterality Date   AXILLARY LYMPH NODE DISSECTION     CHOLECYSTECTOMY      Family History  Problem Relation Age of Onset   Hypertension Mother    Diabetes Mother    Breast cancer Neg Hx     Social History   Socioeconomic History   Marital status: Single    Spouse name: Not on file   Number of children: Not on file   Years of education: Not on file   Highest education level: Not on file  Occupational History   Not on file  Tobacco Use   Smoking status: Never   Smokeless tobacco: Never  Vaping Use   Vaping Use: Never used  Substance and Sexual Activity   Alcohol use: Yes    Comment: occasional    Drug use: Yes    Types: Marijuana    Comment:  once a week   Sexual activity: Yes    Birth control/protection: Condom  Other Topics Concern   Not on file  Social History Narrative   Not on file   Social Determinants of Health   Financial Resource Strain: Low Risk  (07/27/2020)   Overall Financial Resource Strain (CARDIA)    Difficulty of Paying Living Expenses: Not hard at all  Food Insecurity: No Food Insecurity (07/27/2020)   Hunger Vital Sign    Worried About Running Out of Food in the Last Year: Never true    Ran Out of Food in the Last Year: Never true  Transportation Needs: No Transportation Needs (07/27/2020)   PRAPARE - Hydrologist (Medical): No    Lack of Transportation (Non-Medical): No  Physical Activity: Insufficiently Active (07/27/2020)   Exercise Vital Sign    Days of Exercise per Week: 3 days    Minutes of Exercise per Session: 30 min  Stress: Stress Concern Present (07/27/2020)   [place]    Feeling of Stress : To some extent  Social Connections: Moderately Integrated (07/27/2020)   Social Connection and Isolation Panel [NHANES]  Frequency of Communication with Friends and Family: More than three times a week    Frequency of Social Gatherings with Friends and Family: Once a week    Attends Religious Services: More than 4 times per year    Active Member of [employer] or Organizations: Yes    Attends Archivist Meetings: More than 4 times per year    Marital Status: Never married  Intimate Partner Violence: Not At Risk (07/27/2020)   Humiliation, Afraid, Rape, and Kick questionnaire    Fear of Current or Ex-Partner: No    Emotionally Abused: No    Physically Abused: No    Sexually Abused: No     Physical Exam   Vitals:   07/21/21 2236 07/22/21 0217  BP: (!) 148/84 (!) 160/90  Pulse: 81 90  Resp: 18 18  Temp:    SpO2: 100% 96%    CONSTITUTIONAL: Well-appearing, NAD NEURO/PSYCH:  Alert and oriented x  3, no focal deficits EYES:  eyes equal and reactive ENT/NECK:  no LAD, no JVD CARDIO: Regular rate, well-perfused, normal S1 and S2 PULM:  CTAB no wheezing or rhonchi GI/GU:  non-distended, non-tender MSK/SPINE:  No gross deformities, no edema SKIN:  no rash, atraumatic   *Additional and/or pertinent findings included in MDM below  Diagnostic and Interventional Summary    EKG Interpretation  Date/Time:  Saturday July 21 2021 19:27:01 EDT Ventricular Rate:  85 PR Interval:  156 QRS Duration: 152 QT Interval:  442 QTC Calculation: 525 R Axis:   92 Text Interpretation: Normal sinus rhythm Right bundle branch block Abnormal ECG When compared with ECG of 20-Jul-2021 03:01, PREVIOUS ECG IS PRESENT Confirmed by Gerlene Fee [phone]) on 07/22/2021 3:39:10 AM       Labs Reviewed  BASIC METABOLIC PANEL - Abnormal; Notable for the following components:      Result Value   Potassium 3.2 (*)    Glucose, Bld 160 (*)    All other components within normal limits  CBC - Abnormal; Notable for the following components:   HCT 34.9 (*)    MCV 73.2 (*)    MCH 25.8 (*)    All other components within normal limits  TROPONIN I (HIGH SENSITIVITY)  TROPONIN I (HIGH SENSITIVITY)    DG Chest 1 View  Final Result      Medications  alum & mag hydroxide-simeth (MAALOX/MYLANTA) 200-200-20 MG/5ML suspension 30 mL (has no administration in time range)     Procedures  /  Critical Care Procedures  ED Course and Medical Decision Making  Initial Impression and Ddx Patient presenting with abdominal pain, chest pain worse after meals.  Seems consistent with her prior GERD.  Other considerations include ACS, PE, dissection, all feeling much less likely.  EKG is unchanged from prior, chest x-ray normal, labs revealing no significant blood count or electrolyte disturbance, troponin negative x2.  No tachypnea or tachycardia, no history or risk factors for blood clots, highly doubt PE.  Doubt cardiopulmonary  process, will send home with Carafate and she will start taking her Nexium daily instead of as needed.  Past medical/surgical history that increases complexity of ED encounter: GERD  Interpretation of Diagnostics I personally reviewed the EKG and my interpretation is as follows: Sinus rhythm, bundle branch block, unchanged from prior  See above  Patient Reassessment and Ultimate Disposition/Management     Discharge  Patient management required discussion with the following services or consulting groups:  None  Complexity of Problems Addressed Acute illness or injury  that poses threat of life of bodily function  Additional Data Reviewed and Analyzed Further history obtained from: Past medical history and medications listed in the EMR, Prior ED visit notes, and Prior labs/imaging results  Additional Factors Impacting ED Encounter Risk Prescriptions  Barth Kirks. [place] Small, [place] [email or web address]  Final Clinical Impressions(s) / ED Diagnoses     ICD-10-CM   1. Chest pain, unspecified type  R07.9     2. LUQ abdominal pain  R10.12       ED Discharge Orders          Ordered    sucralfate (CARAFATE) 1 g tablet  4 times daily PRN        07/22/21 0452             Discharge Instructions Discussed with and Provided to Patient:    Discharge Instructions      You were evaluated in the Emergency Department and after careful evaluation, we did not find any emergent condition requiring admission or further testing in the hospital.  Your exam/testing today is overall reassuring.  Symptoms seem to be due to your acid reflux.  Recommend taking your Nexium medication daily to prevent pain.  You can use the Carafate medication as needed for immediate relief.  Please return to the Emergency Department if you experience any worsening of your condition.   Thank you for allowing [place] to be a part of your care.      Maudie Flakes, MD 07/22/21 [phone]

## 2021-07-24 ENCOUNTER — Ambulatory Visit: Payer: Self-pay | Admitting: Nurse Practitioner

## 2021-08-02 ENCOUNTER — Ambulatory Visit (INDEPENDENT_AMBULATORY_CARE_PROVIDER_SITE_OTHER): Payer: Medicare Other | Admitting: Nurse Practitioner

## 2021-08-02 DIAGNOSIS — Z0000 Encounter for general adult medical examination without abnormal findings: Secondary | ICD-10-CM

## 2021-08-02 NOTE — Progress Notes (Signed)
Subjective:   Crystal Bullock is a 56 y.o. female who presents for an Initial Medicare Annual Wellness Visit.    I connected with  [name] on 08/02/21 by  telephone  enabled telemedicine application and verified that I am speaking with the correct person using two identifiers.  Patient Location: Home  Provider Location: Office/Clinic  I discussed the limitations of evaluation and management by telemedicine. The patient expressed understanding and agreed to proceed.   Review of Systems           Objective:    There were no vitals filed for this visit. There is no height or weight on file to calculate BMI.     07/21/2021    7:28 PM 07/20/2021    2:50 AM 02/11/2021    1:16 PM 09/10/2019    3:10 PM 07/07/2018    5:28 PM 09/14/2017   11:58 AM 01/13/2017    4:54 PM  Advanced Directives  Does Patient Have a Medical Advance Directive? No No No No No Yes No  Type of Teacher, early years/pre;Living will   Would patient like information on creating a medical advance directive?   No - Patient declined No - Patient declined No - Patient declined  No - Patient declined    Current Medications (verified) Outpatient Encounter Medications as of 08/02/2021  Medication Sig   albuterol (PROVENTIL) (2.5 MG/3ML) 0.083% nebulizer solution USE 1 VIAL VIA NEBULIZER EVERY 6 HOURS AS NEEDED FOR WHEEZING OR SHORTNESS OF BREATH (Patient taking differently: Take 2.5 mg by nebulization every 6 (six) hours as needed for shortness of breath or wheezing.)   albuterol (VENTOLIN HFA) 108 (90 Base) MCG/ACT inhaler INHALE 2 PUFFS INTO THE LUNGS EVERY 6 HOURS FOR UP TO 90 DOSES AS NEEDED FOR WHEEZING (Patient taking differently: Inhale 2 puffs into the lungs every 6 (six) hours as needed for shortness of breath or wheezing.)   amLODipine (NORVASC) 10 MG tablet TAKE 1 TABLET(10 MG) BY MOUTH DAILY (Patient taking differently: Take 10 mg by mouth daily.)    amoxicillin-clavulanate (AUGMENTIN) 875-125 MG tablet Take 1 tablet by mouth 2 (two) times daily. (Patient taking differently: Take 1 tablet by mouth See admin instructions. Bid x 10 days)   baclofen (LIORESAL) 10 MG tablet Take 10 mg by mouth 2 (two) times daily as needed for muscle spasms.   busPIRone (BUSPAR) 7.5 MG tablet Take 1 tablet (7.5 mg total) by mouth 3 (three) times daily. (Patient taking differently: Take 7.5 mg by mouth 2 (two) times daily as needed (anxiety).)   cetirizine (ZYRTEC) 10 MG tablet TAKE 1 TABLET(10 MG) BY MOUTH DAILY AS NEEDED FOR ALLERGIES (Patient taking differently: Take 10 mg by mouth daily.)   cyclobenzaprine (FLEXERIL) 10 MG tablet Take 1 tablet (10 mg total) by mouth 3 (three) times daily as needed for muscle spasms.   esomeprazole (NEXIUM) 40 MG capsule TAKE 1 CAPSULE(40 MG) BY MOUTH DAILY (Patient taking differently: Take 40 mg by mouth daily.)   fluticasone (FLONASE) 50 MCG/ACT nasal spray Place 2 sprays into both nostrils daily as needed. allergies (Patient taking differently: Place 2 sprays into both nostrils daily.)   hydrOXYzine (VISTARIL) 50 MG capsule Take as prescribed as AVS. (Patient not taking: Reported on 07/22/2021)   ibuprofen (ADVIL) 600 MG tablet Take 1 tablet (600 mg total) by mouth every 6 (six) hours as needed for moderate pain. (Patient not taking: Reported on 07/22/2021)   LINZESS 72  MCG capsule Take 72 mcg by mouth daily.   Multiple Vitamins-Minerals (MULTIVITAMINS THER. W/MINERALS) TABS Take 1 tablet by mouth daily.   NON FORMULARY CPAP at bedtime   nystatin ointment (MYCOSTATIN) Apply 1 application. topically 2 (two) times daily.   ondansetron (ZOFRAN ODT) 4 MG disintegrating tablet Take 1 tablet (4 mg total) by mouth every 8 (eight) hours as needed for nausea or vomiting.   oxyCODONE-acetaminophen (PERCOCET) 10-325 MG tablet Take 1 tablet by mouth every 8 (eight) hours as needed for pain.   sucralfate (CARAFATE) 1 g tablet Take 1 tablet (1 g  total) by mouth 4 (four) times daily as needed.   vitamin C (ASCORBIC ACID) 500 MG tablet Take 500 mg by mouth daily.   Vitamin D, Ergocalciferol, (DRISDOL) 1.25 MG (50000 UNIT) CAPS capsule Take 1 capsule (50,000 Units total) by mouth every 7 (seven) days. (Patient taking differently: Take 50,000 Units by mouth every Saturday.)   No facility-administered encounter medications on file as of 08/02/2021.    Allergies (verified) Iodinated contrast media   History: Past Medical History:  Diagnosis Date   Anxiety 10/06/2015   Asthma    Atypical chest pain 10/06/2015   Chronic neck pain    Fibroid    Hypertension    Palpitations 10/06/2015   RBBB 10/06/2015   Sarcoidosis    Scoliosis    Seasonal allergies    Sickle cell trait ([place])    Vitamin D deficiency 05/2019   Past Surgical History:  Procedure Laterality Date   AXILLARY LYMPH NODE DISSECTION     CHOLECYSTECTOMY     Family History  Problem Relation Age of Onset   Hypertension Mother    Diabetes Mother    Breast cancer Neg Hx    Social History   Socioeconomic History   Marital status: Single    Spouse name: Not on file   Number of children: Not on file   Years of education: Not on file   Highest education level: Not on file  Occupational History   Not on file  Tobacco Use   Smoking status: Never   Smokeless tobacco: Never  Vaping Use   Vaping Use: Never used  Substance and Sexual Activity   Alcohol use: Yes    Comment: occasional    Drug use: Yes    Types: Marijuana    Comment: once a week   Sexual activity: Yes    Birth control/protection: Condom  Other Topics Concern   Not on file  Social History Narrative   Not on file   Social Determinants of Health   Financial Resource Strain: Low Risk  (07/27/2020)   Overall Financial Resource Strain (CARDIA)    Difficulty of Paying Living Expenses: Not hard at all  Food Insecurity: No Food Insecurity (07/27/2020)   Hunger Vital Sign    Worried About Running Out of  Food in the Last Year: Never true    Ran Out of Food in the Last Year: Never true  Transportation Needs: No Transportation Needs (07/27/2020)   PRAPARE - Hydrologist (Medical): No    Lack of Transportation (Non-Medical): No  Physical Activity: Insufficiently Active (07/27/2020)   Exercise Vital Sign    Days of Exercise per Week: 3 days    Minutes of Exercise per Session: 30 min  Stress: Stress Concern Present (07/27/2020)   [place]    Feeling of Stress : To some extent  Social Connections:  Moderately Integrated (07/27/2020)   Social Connection and Isolation Panel [NHANES]    Frequency of Communication with Friends and Family: More than three times a week    Frequency of Social Gatherings with Friends and Family: Once a week    Attends Religious Services: More than 4 times per year    Active Member of [employer] or Organizations: Yes    Attends Music therapist: More than 4 times per year    Marital Status: Never married    Tobacco Counseling Counseling given: Not Answered   Clinical Intake:                 Diabetic? Patient is not a diabetic         Activities of Daily Living     No data to display           Patient Care Team: Teena Dunk, NP as PCP - General (Nurse Practitioner)  Indicate any recent Medical Services you may have received from other than [facility] providers in the past year (date may be approximate).     Assessment:   This is a routine wellness examination for [place].  Hearing/Vision screen No results found.  Dietary issues and exercise activities discussed:     Goals Addressed   None   Depression Screen    05/28/2021    4:13 PM 03/19/2021   10:13 AM 12/15/2020    9:53 AM 07/27/2020    3:11 PM 03/08/2020    8:53 AM 10/26/2019    9:50 AM 07/13/2019   10:22 AM  PHQ 2/9 Scores  PHQ - 2 Score 0 0 0 1 0 0 0    Fall Risk     05/28/2021    4:13 PM 03/19/2021   10:13 AM 12/15/2020    9:52 AM 07/27/2020    3:16 PM 03/08/2020    8:53 AM  Fall Risk   Falls in the past year? 0 1 0 1 0  Number falls in past yr: 0 0 0 0   Injury with Fall? 0 1 0 1   Risk for fall due to : No Fall Risks   History of fall(s)   Follow up Falls evaluation completed   Education provided Falls evaluation completed    [place]:  Any stairs in or around the home? Yes  If so, are there any without handrails? Yes  Home free of loose throw rugs in walkways, pet beds, electrical cords, etc? Yes  Adequate lighting in your home to reduce risk of falls? Yes   ASSISTIVE DEVICES UTILIZED TO PREVENT FALLS:  Life alert? No  Use of a cane, walker or w/c? Yes  Grab bars in the bathroom? No  Shower chair or bench in shower? Yes  Elevated toilet seat or a handicapped toilet? Yes   TIMED UP AND GO:  Was the test performed? No .      Cognitive Function:        Immunizations  There is no immunization history on file for this patient.  TDAP status: Due, Education has been provided regarding the importance of this vaccine. Advised may receive this vaccine at local pharmacy or Health Dept. Aware to provide a copy of the vaccination record if obtained from local pharmacy or Health Dept. Verbalized acceptance and understanding.  Flu Vaccine status: Due, Education has been provided regarding the importance of this vaccine. Advised may receive this vaccine at local pharmacy or Health Dept. Aware to  provide a copy of the vaccination record if obtained from local pharmacy or Health Dept. Verbalized acceptance and understanding.  Pneumococcal vaccine status: Due, Education has been provided regarding the importance of this vaccine. Advised may receive this vaccine at local pharmacy or Health Dept. Aware to provide a copy of the vaccination record if obtained from local pharmacy or Health Dept. Verbalized acceptance and  understanding.  Covid-19 vaccine status: Declined, Education has been provided regarding the importance of this vaccine but patient still declined. Advised may receive this vaccine at local pharmacy or Health Dept.or vaccine clinic. Aware to provide a copy of the vaccination record if obtained from local pharmacy or Health Dept. Verbalized acceptance and understanding.  Qualifies for Shingles Vaccine? Yes   Zostavax completed No   Shingrix Completed?: Yes  Screening Tests Health Maintenance  Topic Date Due   Zoster Vaccines- Shingrix (1 of 2) Never done   PAP SMEAR-Modifier  10/04/2018   COLONOSCOPY (Pts 45-34yr Insurance coverage will need to be confirmed)  03/19/2022 (Originally 10/07/2012)   TETANUS/TDAP  03/19/2022 (Originally 10/08/1986)   Hepatitis C Screening  03/19/2022 (Originally 10/07/1985)   MAMMOGRAM  05/19/2023   HIV Screening  Completed   HPV VACCINES  Aged Out   COVID-19 Vaccine  Discontinued    Health Maintenance  Health Maintenance Due  Topic Date Due   Zoster Vaccines- Shingrix (1 of 2) Never done   PAP SMEAR-Modifier  10/04/2018    Colorectal cancer screening: Type of screening: Colonoscopy. Completed yes. Repeat every 10 years Mammogram status: Completed yes. Repeat every year 2 years  Patient has never had a Bone Density performed.  Lung Cancer Screening: (Low Dose CT Chest recommended if Age 54-80years, 30 pack-year currently smoking OR have quit w/in 15years.) does not qualify.   Lung Cancer Screening Referral: No referral needed  Additional Screening:  Hepatitis C Screening: does qualify; Completed never performed  Vision Screening: Recommended annual ophthalmology exams for early detection of glaucoma and other disorders of the eye. Is the patient up to date with their annual eye exam?   patient has an upcoming appointment for this year Who is the provider or what is the name of the office in which the patient attends annual eye exams? Patient  couldn't remember the name of her eye provider. If pt is not established with a provider, would they like to be referred to a provider to establish care?  Patient is established with a PCP .   Dental Screening: Recommended annual dental exams for proper oral hygiene  Community Resource Referral / Chronic Care Management: CRR required this visit?  No   CCM required this visit?  Yes      Plan:     I have personally reviewed and noted the following in the patient's chart:   Medical and social history Use of alcohol, tobacco or illicit drugs  Current medications and supplements including opioid prescriptions. Patient is currently taking opioid prescriptions. Information provided to patient regarding non-opioid alternatives. Patient advised to discuss non-opioid treatment plan with their provider. Functional ability and status Nutritional status Physical activity Advanced directives List of other physicians Hospitalizations, surgeries, and ER visits in previous 12 months Vitals Screenings to include cognitive, depression, and falls Referrals and appointments  In addition, I have reviewed and discussed with patient certain preventive protocols, quality metrics, and best practice recommendations. A written personalized care plan for preventive services as well as general preventive health recommendations were provided to patient.     ACaryl Pina  L Christophe Rising, CMA   08/02/2021   Nurse Notes: I spent 20 mins via telephone visit with the patient today.    Ms. Sabourin , Thank you for taking time to come for your Medicare Wellness Visit. I appreciate your ongoing commitment to your health goals. Please review the following plan we discussed and let me know if I can assist you in the future.   These are the goals we discussed:  Goals      DIET - REDUCE FAT INTAKE        This is a list of the screening recommended for you and due dates:  Health Maintenance  Topic Date Due   Zoster  (Shingles) Vaccine (1 of 2) Never done   Pap Smear  10/04/2018   Colon Cancer Screening  03/19/2022*   Tetanus Vaccine  03/19/2022*   Hepatitis C Screening: USPSTF Recommendation to screen - Ages 18-79 yo.  03/19/2022*   Mammogram  05/19/2023   HIV Screening  Completed   HPV Vaccine  Aged Out   COVID-19 Vaccine  Discontinued  *Topic was postponed. The date shown is not the original due date.

## 2021-08-06 ENCOUNTER — Other Ambulatory Visit: Payer: Self-pay

## 2021-08-06 ENCOUNTER — Telehealth: Payer: Self-pay | Admitting: Nurse Practitioner

## 2021-08-06 DIAGNOSIS — L732 Hidradenitis suppurativa: Secondary | ICD-10-CM

## 2021-08-07 ENCOUNTER — Ambulatory Visit: Payer: Self-pay | Admitting: Nurse Practitioner

## 2021-08-07 MED ORDER — AMOXICILLIN-POT CLAVULANATE 875-125 MG PO TABS: 1.0000 | ORAL_TABLET | Freq: Two times a day (BID) | ORAL | 0 refills | Status: DC

## 2021-08-27 ENCOUNTER — Ambulatory Visit: Payer: Self-pay | Admitting: Nurse Practitioner

## 2021-09-13 ENCOUNTER — Emergency Department (HOSPITAL_COMMUNITY)
Admission: EM | Admit: 2021-09-13 | Discharge: 2021-09-13 | Disposition: A | Discharge status: Home / Self Care | Payer: Medicare Other | Attending: Emergency Medicine | Admitting: Emergency Medicine

## 2021-09-13 ENCOUNTER — Emergency Department (HOSPITAL_COMMUNITY): Payer: Medicare Other

## 2021-09-13 ENCOUNTER — Encounter (HOSPITAL_COMMUNITY): Payer: Self-pay

## 2021-09-13 DIAGNOSIS — M5442 Lumbago with sciatica, left side: Secondary | ICD-10-CM | POA: Insufficient documentation

## 2021-09-13 DIAGNOSIS — M5416 Radiculopathy, lumbar region: Secondary | ICD-10-CM | POA: Diagnosis not present

## 2021-09-13 DIAGNOSIS — M545 Low back pain, unspecified: Secondary | ICD-10-CM | POA: Diagnosis present

## 2021-09-13 DIAGNOSIS — M5441 Lumbago with sciatica, right side: Secondary | ICD-10-CM | POA: Diagnosis not present

## 2021-09-13 LAB — URINALYSIS, ROUTINE W REFLEX MICROSCOPIC
Bilirubin Urine: NEGATIVE
Glucose, UA: NEGATIVE mg/dL
Hgb urine dipstick: NEGATIVE
Ketones, ur: NEGATIVE mg/dL
Leukocytes,Ua: NEGATIVE
Nitrite: NEGATIVE
Protein, ur: NEGATIVE mg/dL
Specific Gravity, Urine: 1.016 (ref 1.005–1.030)
pH: 6 (ref 5.0–8.0)

## 2021-09-13 LAB — CBC
HCT: 37.2 % (ref 36.0–46.0)
Hemoglobin: 12.9 g/dL (ref 12.0–15.0)
MCH: 25.4 pg — ABNORMAL LOW (ref 26.0–34.0)
MCHC: 34.7 g/dL (ref 30.0–36.0)
MCV: 73.4 fL — ABNORMAL LOW (ref 80.0–100.0)
Platelets: 257 10*3/uL (ref 150–400)
RBC: 5.07 MIL/uL (ref 3.87–5.11)
RDW: 14.4 % (ref 11.5–15.5)
WBC: 9 10*3/uL (ref 4.0–10.5)
nRBC: 0 % (ref 0.0–0.2)

## 2021-09-13 LAB — BASIC METABOLIC PANEL
Anion gap: 6 (ref 5–15)
BUN: 13 mg/dL (ref 6–20)
CO2: 27 mmol/L (ref 22–32)
Calcium: 9.1 mg/dL (ref 8.9–10.3)
Chloride: 103 mmol/L (ref 98–111)
Creatinine, Ser: 0.88 mg/dL (ref 0.44–1.00)
GFR, Estimated: >60 mL/min (ref >60)
Glucose, Bld: 127 mg/dL — ABNORMAL HIGH (ref 70–99)
Potassium: 3.3 mmol/L — ABNORMAL LOW (ref 3.5–5.1)
Sodium: 136 mmol/L (ref 135–145)

## 2021-09-13 MED ORDER — OXYCODONE-ACETAMINOPHEN 5-325 MG PO TABS: 1.0000 | ORAL_TABLET | Freq: Four times a day (QID) | ORAL | 0 refills | Status: DC | PRN

## 2021-09-13 MED ORDER — SODIUM CHLORIDE 0.9 % IV SOLN: Freq: Once | INTRAVENOUS | Status: AC

## 2021-09-13 MED ORDER — METHOCARBAMOL 500 MG PO TABS: 500.0000 mg | ORAL_TABLET | Freq: Four times a day (QID) | ORAL | 0 refills | Status: AC | PRN

## 2021-09-13 MED ORDER — METHOCARBAMOL 1000 MG/10ML IJ SOLN: 500.0000 mg | Freq: Once | INTRAMUSCULAR | Status: DC

## 2021-09-13 MED ORDER — DEXAMETHASONE SODIUM PHOSPHATE 10 MG/ML IJ SOLN
10.0000 mg | Freq: Once | INTRAMUSCULAR | Status: AC
  Administered 2021-09-13: 10 mg via INTRAVENOUS
  Filled 2021-09-13: qty 1

## 2021-09-13 MED ORDER — HYDROMORPHONE HCL 1 MG/ML IJ SOLN
1.0000 mg | Freq: Once | INTRAMUSCULAR | Status: AC
  Administered 2021-09-13: 1 mg via INTRAVENOUS
  Filled 2021-09-13: qty 1

## 2021-09-13 MED ORDER — METHYLPREDNISOLONE 4 MG PO TBPK: ORAL_TABLET | ORAL | 0 refills | Status: DC

## 2021-09-13 MED ORDER — METHOCARBAMOL 1000 MG/10ML IJ SOLN
500.0000 mg | Freq: Once | INTRAVENOUS | Status: AC
  Administered 2021-09-13: 500 mg via INTRAVENOUS
  Filled 2021-09-13: qty 500

## 2021-09-13 MED ORDER — LORAZEPAM 2 MG/ML IJ SOLN
1.0000 mg | Freq: Once | INTRAMUSCULAR | Status: AC | PRN
  Administered 2021-09-13: 1 mg via INTRAVENOUS
  Filled 2021-09-13: qty 1

## 2021-09-13 MED ORDER — OXYCODONE-ACETAMINOPHEN 5-325 MG PO TABS
2.0000 | ORAL_TABLET | Freq: Once | ORAL | Status: AC
  Administered 2021-09-13: 2 via ORAL
  Filled 2021-09-13: qty 2

## 2021-09-13 NOTE — ED Notes (Signed)
Pt requesting taxi [place]. States she has no family that lives near here. Messaged Education officer, museum

## 2021-09-13 NOTE — ED Triage Notes (Signed)
Pt BIBA from home for severe back pain radiating down both legs. Threw back out last week, attempting home remedies including percocet 3 pills 3x/day, baclofen, ibuprofen, and heat with no relief. Dull back pain and tingling down back of legs into feet when still, sharp pain with movement. Worse the last 3 days. Had some constipation and successful laxative use last night. Endorses numbness and weakness in bilateral feet.  160/112 hx htn missed some meds Hr 67 18 rr 96% RA

## 2021-09-13 NOTE — ED Notes (Signed)
Patient transported to MRI 

## 2021-09-13 NOTE — ED Provider Notes (Signed)
[facility] DEPT Provider Note   CSN: [id number] Arrival date & time: 09/13/21  0109     History  Chief Complaint  Patient presents with   Back Pain    Crystal Bullock is a 56 y.o. female.  HPI Patient reports he has been having a lot of lower back pain for over a week.  She has been having a lot of aching and burning in the low back with radiation down the back of both of her legs into the feet.  Pain is made much worse by movement.  She reports that she has been trying to take ibuprofen at home.  She is also taken baclofen and Percocet.  She reports that she felt like she got constipated after she took the Percocet.  He did take laxatives and managed to have a bowel movement last night.  She reports however the past 3 days the pain in her back has gotten worse and is not manageable by any of the above medications.  She reports she started to feel that there is numbness in her feet.  Denies any loss of sensation in her genital or rectal region.  No incontinence of bowel or bladder    Home Medications Prior to Admission medications   Medication Sig Start Date End Date Taking? Authorizing Provider  methocarbamol (ROBAXIN) 500 MG tablet Take 1 tablet (500 mg total) by mouth every 6 (six) hours as needed for muscle spasms. 09/13/21  Yes Charlesetta Shanks, MD  methylPREDNISolone (MEDROL DOSEPAK) 4 MG TBPK tablet Take per Dosepak instruction. 09/13/21  Yes Charlesetta Shanks, MD  oxyCODONE-acetaminophen (PERCOCET) 5-325 MG tablet Take 1-2 tablets by mouth every 6 (six) hours as needed. 09/13/21  Yes Charlesetta Shanks, MD  albuterol (PROVENTIL) (2.5 MG/3ML) 0.083% nebulizer solution USE 1 VIAL VIA NEBULIZER EVERY 6 HOURS AS NEEDED FOR WHEEZING OR SHORTNESS OF BREATH Patient taking differently: Take 2.5 mg by nebulization every 6 (six) hours as needed for shortness of breath or wheezing. 03/19/21   Vevelyn Francois, NP  albuterol (VENTOLIN HFA) 108 (90 Base) MCG/ACT inhaler  INHALE 2 PUFFS INTO THE LUNGS EVERY 6 HOURS FOR UP TO 90 DOSES AS NEEDED FOR WHEEZING Patient taking differently: Inhale 2 puffs into the lungs every 6 (six) hours as needed for shortness of breath or wheezing. 05/28/21   Passmore, Jake Church I, NP  amLODipine (NORVASC) 10 MG tablet TAKE 1 TABLET(10 MG) BY MOUTH DAILY Patient taking differently: Take 10 mg by mouth daily. 07/13/21   Bo Merino I, NP  amoxicillin-clavulanate (AUGMENTIN) 875-125 MG tablet Take 1 tablet by mouth 2 (two) times daily. 08/07/21   Passmore, Jake Church I, NP  baclofen (LIORESAL) 10 MG tablet Take 10 mg by mouth 2 (two) times daily as needed for muscle spasms. 03/02/21   [provider]  busPIRone (BUSPAR) 7.5 MG tablet Take 1 tablet (7.5 mg total) by mouth 3 (three) times daily. Patient taking differently: Take 7.5 mg by mouth 2 (two) times daily as needed (anxiety). 08/24/18   Lanae Boast, FNP  cetirizine (ZYRTEC) 10 MG tablet TAKE 1 TABLET(10 MG) BY MOUTH DAILY AS NEEDED FOR ALLERGIES Patient taking differently: Take 10 mg by mouth daily. 03/19/21   Vevelyn Francois, NP  cyclobenzaprine (FLEXERIL) 10 MG tablet Take 1 tablet (10 mg total) by mouth 3 (three) times daily as needed for muscle spasms. 02/11/21   Domenic Moras, PA-C  esomeprazole (NEXIUM) 40 MG capsule TAKE 1 CAPSULE(40 MG) BY MOUTH DAILY Patient taking differently: Take  40 mg by mouth daily. 02/26/20   Vevelyn Francois, NP  fluticasone (FLONASE) 50 MCG/ACT nasal spray Place 2 sprays into both nostrils daily as needed. allergies Patient taking differently: Place 2 sprays into both nostrils daily. 04/18/20   Azzie Glatter, FNP  hydrOXYzine (VISTARIL) 50 MG capsule Take as prescribed as AVS. 04/07/19   Vevelyn Francois, NP  ibuprofen (ADVIL) 600 MG tablet Take 1 tablet (600 mg total) by mouth every 6 (six) hours as needed for moderate pain. 02/11/21   Domenic Moras, PA-C  LINZESS 72 MCG capsule Take 72 mcg by mouth daily. 12/06/20   [provider]  Multiple  Vitamins-Minerals (MULTIVITAMINS THER. W/MINERALS) TABS Take 1 tablet by mouth daily.    [provider]  NON FORMULARY CPAP at bedtime    [provider]  nystatin ointment (MYCOSTATIN) Apply 1 application. topically 2 (two) times daily. 07/03/21   Passmore, Jake Church I, NP  ondansetron (ZOFRAN ODT) 4 MG disintegrating tablet Take 1 tablet (4 mg total) by mouth every 8 (eight) hours as needed for nausea or vomiting. 04/07/19   Vevelyn Francois, NP  oxyCODONE-acetaminophen (PERCOCET) 10-325 MG tablet Take 1 tablet by mouth every 8 (eight) hours as needed for pain.    [provider]  sucralfate (CARAFATE) 1 g tablet Take 1 tablet (1 g total) by mouth 4 (four) times daily as needed. 07/22/21   Maudie Flakes, MD  vitamin C (ASCORBIC ACID) 500 MG tablet Take 500 mg by mouth daily.    [provider]  Vitamin D, Ergocalciferol, (DRISDOL) 1.25 MG (50000 UNIT) CAPS capsule Take 1 capsule (50,000 Units total) by mouth every 7 (seven) days. Patient taking differently: Take 50,000 Units by mouth every Saturday. 04/10/20   Azzie Glatter, FNP      Allergies    Iodinated contrast media    Review of Systems   Review of Systems 10 systems reviewed negative except as per HPI Physical Exam Updated Vital Signs BP (!) 146/83   Pulse 78   Temp 98 F (36.7 C)   Resp 20   Ht '5\' 7"'$  (1.702 m)   Wt 109.8 kg   LMP 08/24/2015 Comment: irregular periods per pt 09/12/15  SpO2 99%   BMI 37.90 kg/m  Physical Exam Constitutional:      Comments: Alert nontoxic clear mental status.  Patient is a lot of pain lying on her side with difficulty changing position.  No respiratory distress  HENT:     Head: Normocephalic and atraumatic.     Mouth/Throat:     Pharynx: Oropharynx is clear.  Eyes:     Extraocular Movements: Extraocular movements intact.  Cardiovascular:     Rate and Rhythm: Normal rate and regular rhythm.  Pulmonary:     Effort: Pulmonary effort is normal.     Breath  sounds: Normal breath sounds.  Abdominal:     General: There is no distension.     Palpations: Abdomen is soft.     Tenderness: There is no abdominal tenderness. There is no guarding.  Musculoskeletal:     Cervical back: Neck supple.     Comments: Patient endorses pain to palpation in her lumbar spine area and over the SI region in her buttocks.  No soft tissue abnormalities to palpation or visual inspection.  No peripheral edema.  Dorsalis pedis pulses are 2+ symmetric.  Feet are warm and dry.  Patient expresses severe pain with trying to reposition from lying on her side  to flat to sitting.  Neurological:     General: No focal deficit present.     Mental Status: She is oriented to person, place, and time.     Comments: Upon first exam, before pain control, patient felt it was difficult to perform strength testing for lower extremities.  This did seem to be pain limited.  Psychiatric:        Mood and Affect: Mood normal.     ED Results / Procedures / Treatments   Labs (all labs ordered are listed, but only abnormal results are displayed) Labs Reviewed  BASIC METABOLIC PANEL - Abnormal; Notable for the following components:      Result Value   Potassium 3.3 (*)    Glucose, Bld 127 (*)    All other components within normal limits  CBC - Abnormal; Notable for the following components:   MCV 73.4 (*)    MCH 25.4 (*)    All other components within normal limits  URINALYSIS, ROUTINE W REFLEX MICROSCOPIC    EKG None  Radiology No results found.  Procedures Procedures    Medications Ordered in ED Medications  0.9 %  sodium chloride infusion (0 mLs Intravenous Stopped 09/13/21 1121)  dexamethasone (DECADRON) injection 10 mg (10 mg Intravenous Given 09/13/21 1024)  HYDROmorphone (DILAUDID) injection 1 mg (1 mg Intravenous Given 09/13/21 1024)  LORazepam (ATIVAN) injection 1 mg (1 mg Intravenous Given 09/13/21 1119)  methocarbamol (ROBAXIN) 500 mg in dextrose 5 % 50 mL IVPB (0 mg  Intravenous Stopped 09/13/21 1119)  oxyCODONE-acetaminophen (PERCOCET/ROXICET) 5-325 MG per tablet 2 tablet (2 tablets Oral Given 09/13/21 1517)    ED Course/ Medical Decision Making/ A&P                           Medical Decision Making Amount and/or Complexity of Data Reviewed Labs: ordered. Radiology: ordered.  Risk Prescription drug management.   Patient presents with severe lower back pain and bilateral radiculopathy.  At this time this appears most likely to be musculoskeletal with possible disc herniation or nerve impingement.  Patient has good distal pulses and warm dry extremities, low suspicion for vascular etiology.  She does not have any abdominal pain or GU or GI symptoms aside from constipation that is now resolved.  Low suspicion for intra-abdominal source of back and buttock and lower extremity pain.  Currently based on severity of pain and progression over the past week we will proceed with MRI.  Lan for pain control with Decadron, Dilaudid and Robaxin.  Patient is significantly proved pain control with reassessment.  She reports she is comfortable for the first time in over a week.  MRI shows several areas of disc protrusion and L5 nerve root impingement on the right.  Nerve root impingement on several levels but no cauda equina and no significant canal stenosis.  Uterine fibroids identified that were present on prior ultrasound studies.  After MRI and good pain control, patient was able to reposition and sit up at the side of the bed.  She is able to bear weight and ambulate independently.  Patient can reposition fairly well from a seated position to supine and back up again at this time, with adequate pain control patient has intact neurologic function.  Consult: Reviewed with Dr. Glenford Peers at [facility] neurosurgery.  Reviewed MRI results and at this time patient is appropriate for continued treatment with Medrol Dosepak, pain medication and muscle relaxant and outpatient  follow-up at the  office.  I have reviewed the plan with the patient.  At this time we will proceed with outpatient steroid therapy, Percocet and Robaxin for pain control.  Patient is counseled on the use of Colace and MiraLAX to avoid constipation.  We have reviewed return precautions.  At this time appropriate for discharge       Final Clinical Impression(s) / ED Diagnoses Final diagnoses:  Lumbar radiculopathy  Acute bilateral low back pain with bilateral sciatica    Rx / DC Orders ED Discharge Orders          Ordered    methylPREDNISolone (MEDROL DOSEPAK) 4 MG TBPK tablet        09/13/21 1505    oxyCODONE-acetaminophen (PERCOCET) 5-325 MG tablet  Every 6 hours PRN        09/13/21 1505    methocarbamol (ROBAXIN) 500 MG tablet  Every 6 hours PRN        09/13/21 1505              Charlesetta Shanks, MD 09/20/21 2117

## 2021-09-13 NOTE — Discharge Instructions (Signed)
1.  Call [facility] neurosurgery to schedule your follow-up as soon as possible.  Your case was reviewed with Dr. Sallye Lat.  2.  Fill your prescriptions and start your Medrol Dosepak tomorrow as prescribed.  You may take 1-2 Percocet tablets every 6 hours as needed for additional pain control.  Your pain should start improving within the next 12 to 24 hours on steroids.  Decrease your use of Percocet as soon as possible.  Use that is addictive and causes constipation.  If you are using the Percocet, take Colace twice daily.  This is a stool softener.  If you are not having a regular bowel movement also take MiraLAX every day.  You may take Robaxin as prescribed every 6 hours as a muscle relaxer.  Your medications will be sedating.  Do not drive or do any other dangerous activities that could result in injury. 3.  Return if you have new weakness or numbness.  Return if you have bowel or bladder dysfunction or other concerning changes.

## 2021-09-26 ENCOUNTER — Encounter: Payer: Self-pay | Admitting: Nurse Practitioner

## 2021-09-26 ENCOUNTER — Ambulatory Visit (INDEPENDENT_AMBULATORY_CARE_PROVIDER_SITE_OTHER): Payer: Medicare Other | Admitting: Nurse Practitioner

## 2021-09-26 VITALS — BP 154/97 | HR 87 | Temp 98.1°F | Ht 67.0 in | Wt 245.8 lb

## 2021-09-26 DIAGNOSIS — K219 Gastro-esophageal reflux disease without esophagitis: Secondary | ICD-10-CM

## 2021-09-26 DIAGNOSIS — M5416 Radiculopathy, lumbar region: Secondary | ICD-10-CM

## 2021-09-26 DIAGNOSIS — I10 Essential (primary) hypertension: Secondary | ICD-10-CM

## 2021-09-26 DIAGNOSIS — R9389 Abnormal findings on diagnostic imaging of other specified body structures: Secondary | ICD-10-CM

## 2021-09-26 DIAGNOSIS — J4520 Mild intermittent asthma, uncomplicated: Secondary | ICD-10-CM

## 2021-09-26 DIAGNOSIS — D869 Sarcoidosis, unspecified: Secondary | ICD-10-CM | POA: Diagnosis not present

## 2021-09-26 DIAGNOSIS — J309 Allergic rhinitis, unspecified: Secondary | ICD-10-CM

## 2021-09-26 DIAGNOSIS — L732 Hidradenitis suppurativa: Secondary | ICD-10-CM

## 2021-09-26 MED ORDER — ALBUTEROL SULFATE (2.5 MG/3ML) 0.083% IN NEBU: INHALATION_SOLUTION | RESPIRATORY_TRACT | 1 refills | Status: DC

## 2021-09-26 MED ORDER — AMOXICILLIN-POT CLAVULANATE 875-125 MG PO TABS
1.0000 | ORAL_TABLET | Freq: Two times a day (BID) | ORAL | 0 refills | Status: DC
Start: 2021-09-26 — End: 2021-11-19

## 2021-09-26 MED ORDER — FLUTICASONE PROPIONATE 50 MCG/ACT NA SUSP: 2.0000 | Freq: Every day | NASAL | 11 refills | Status: DC | PRN

## 2021-09-26 MED ORDER — ALBUTEROL SULFATE HFA 108 (90 BASE) MCG/ACT IN AERS: INHALATION_SPRAY | RESPIRATORY_TRACT | 1 refills | Status: DC

## 2021-09-26 MED ORDER — CETIRIZINE HCL 10 MG PO TABS: ORAL_TABLET | ORAL | 2 refills | Status: DC

## 2021-09-26 MED ORDER — AMLODIPINE BESYLATE 10 MG PO TABS: ORAL_TABLET | ORAL | 3 refills | Status: DC

## 2021-09-26 MED ORDER — CLINDAMYCIN PHOSPHATE 1 % EX GEL: Freq: Two times a day (BID) | CUTANEOUS | 0 refills | Status: DC

## 2021-09-26 MED ORDER — ESOMEPRAZOLE MAGNESIUM 40 MG PO CPDR
40.0000 mg | DELAYED_RELEASE_CAPSULE | Freq: Every day | ORAL | 0 refills | Status: DC
Start: 2021-09-26 — End: 2023-01-29

## 2021-09-26 NOTE — Assessment & Plan Note (Signed)
-   Ambulatory referral to Neurosurgery  2. Abnormal MRI  - Ambulatory referral to Neurosurgery  3. Sarcoidosis  - albuterol (PROVENTIL) (2.5 MG/3ML) 0.083% nebulizer solution; USE 1 VIAL VIA NEBULIZER EVERY 6 HOURS AS NEEDED FOR WHEEZING OR SHORTNESS OF BREATH  Dispense: 75 mL; Refill: 1 - albuterol (VENTOLIN HFA) 108 (90 Base) MCG/ACT inhaler; INHALE 2 PUFFS INTO THE LUNGS EVERY 6 HOURS FOR UP TO 90 DOSES AS NEEDED FOR WHEEZING  Dispense: 6.7 g; Refill: 1  4. Mild intermittent asthma without complication  - albuterol (PROVENTIL) (2.5 MG/3ML) 0.083% nebulizer solution; USE 1 VIAL VIA NEBULIZER EVERY 6 HOURS AS NEEDED FOR WHEEZING OR SHORTNESS OF BREATH  Dispense: 75 mL; Refill: 1 - albuterol (VENTOLIN HFA) 108 (90 Base) MCG/ACT inhaler; INHALE 2 PUFFS INTO THE LUNGS EVERY 6 HOURS FOR UP TO 90 DOSES AS NEEDED FOR WHEEZING  Dispense: 6.7 g; Refill: 1  5. Essential hypertension  - amLODipine (NORVASC) 10 MG tablet; TAKE 1 TABLET(10 MG) BY MOUTH DAILY  Dispense: 90 tablet; Refill: 3  6. Allergic rhinitis, unspecified seasonality, unspecified trigger  - cetirizine (ZYRTEC) 10 MG tablet; TAKE 1 TABLET(10 MG) BY MOUTH DAILY AS NEEDED FOR ALLERGIES  Dispense: 30 tablet; Refill: 2 - fluticasone (FLONASE) 50 MCG/ACT nasal spray; Place 2 sprays into both nostrils daily as needed. allergies  Dispense: 9.9 mL; Refill: 11  7. Gastroesophageal reflux disease without esophagitis  - esomeprazole (NEXIUM) 40 MG capsule; Take 1 capsule (40 mg total) by mouth daily.  Dispense: 90 capsule; Refill: 0   8. Hidradenitis suppurativa  - clindamycin (CLINDAGEL) 1 % gel; Apply topically 2 (two) times daily.  Dispense: 30 g; Refill: 0 - amoxicillin-clavulanate (AUGMENTIN) 875-125 MG tablet; Take 1 tablet by mouth 2 (two) times daily.  Dispense: 20 tablet; Refill: 0   Follow up:  Follow up in 6 months or sooner if needed

## 2021-09-26 NOTE — Progress Notes (Signed)
$'@Patient's$  ID: [name], female    DOB: [birth year], 56 y.o.   MRN: [id number]  Chief Complaint  Patient presents with   Follow-up    Pt states she is here for a follow up. Pt is requesting referral to neurologist and refill on all medications     Referring provider: No ref. provider found   HPI  Cambrie Sonnenfeld 56 y.o. female  has a past medical history of Anxiety (10/06/2015), Asthma, Atypical chest pain (10/06/2015), Chronic neck pain, Fibroid, Hypertension, Palpitations (10/06/2015), RBBB (10/06/2015), Sarcoidosis, Scoliosis, Seasonal allergies, Sickle cell trait ([place]), and Vitamin D deficiency (05/2019).   Patient presents today for ED follow up. She was seen in the ED on 09/13/21 for lumbar radiculopathy . She advised that she would need neurosurgery follow up, but no referral has been placed. We will place referral today. Patient also complains today of chronic hydradnitis to bilateral axilla. Requesting refill on antibiotics. Will order clindamycin gel as well. Denies f/c/s, n/v/d, hemoptysis, PND, leg swelling Denies chest pain or edema    Allergies  Allergen Reactions   Iodinated Contrast Media Rash    Hives on hands and feet per pt 08/16/15 pt had scan w/ contrast and had a 4 hr premedication protocol done     There is no immunization history on file for this patient.  Past Medical History:  Diagnosis Date   Anxiety 10/06/2015   Asthma    Atypical chest pain 10/06/2015   Chronic neck pain    Fibroid    Hypertension    Palpitations 10/06/2015   RBBB 10/06/2015   Sarcoidosis    Scoliosis    Seasonal allergies    Sickle cell trait ([place])    Vitamin D deficiency 05/2019    Tobacco History: Social History   Tobacco Use  Smoking Status Never  Smokeless Tobacco Never   Counseling given: Not Answered   Outpatient Encounter Medications as of 09/26/2021  Medication Sig   baclofen (LIORESAL) 10 MG tablet Take 10 mg by mouth 2 (two) times daily as needed for  muscle spasms.   busPIRone (BUSPAR) 7.5 MG tablet Take 1 tablet (7.5 mg total) by mouth 3 (three) times daily. (Patient taking differently: Take 7.5 mg by mouth 2 (two) times daily as needed (anxiety).)   clindamycin (CLINDAGEL) 1 % gel Apply topically 2 (two) times daily.   cyclobenzaprine (FLEXERIL) 10 MG tablet Take 1 tablet (10 mg total) by mouth 3 (three) times daily as needed for muscle spasms.   hydrOXYzine (VISTARIL) 50 MG capsule Take as prescribed as AVS.   ibuprofen (ADVIL) 600 MG tablet Take 1 tablet (600 mg total) by mouth every 6 (six) hours as needed for moderate pain.   LINZESS 72 MCG capsule Take 72 mcg by mouth daily.   methocarbamol (ROBAXIN) 500 MG tablet Take 1 tablet (500 mg total) by mouth every 6 (six) hours as needed for muscle spasms.   methylPREDNISolone (MEDROL DOSEPAK) 4 MG TBPK tablet Take per Dosepak instruction.   Multiple Vitamins-Minerals (MULTIVITAMINS THER. W/MINERALS) TABS Take 1 tablet by mouth daily.   NON FORMULARY CPAP at bedtime   nystatin ointment (MYCOSTATIN) Apply 1 application. topically 2 (two) times daily.   ondansetron (ZOFRAN ODT) 4 MG disintegrating tablet Take 1 tablet (4 mg total) by mouth every 8 (eight) hours as needed for nausea or vomiting.   oxyCODONE-acetaminophen (PERCOCET) 10-325 MG tablet Take 1 tablet by mouth every 8 (eight) hours as needed for pain.   oxyCODONE-acetaminophen (PERCOCET) 5-325 MG  tablet Take 1-2 tablets by mouth every 6 (six) hours as needed.   sucralfate (CARAFATE) 1 g tablet Take 1 tablet (1 g total) by mouth 4 (four) times daily as needed.   vitamin C (ASCORBIC ACID) 500 MG tablet Take 500 mg by mouth daily.   Vitamin D, Ergocalciferol, (DRISDOL) 1.25 MG (50000 UNIT) CAPS capsule Take 1 capsule (50,000 Units total) by mouth every 7 (seven) days. (Patient taking differently: Take 50,000 Units by mouth every Saturday.)   [DISCONTINUED] albuterol (PROVENTIL) (2.5 MG/3ML) 0.083% nebulizer solution USE 1 VIAL VIA  NEBULIZER EVERY 6 HOURS AS NEEDED FOR WHEEZING OR SHORTNESS OF BREATH (Patient taking differently: Take 2.5 mg by nebulization every 6 (six) hours as needed for shortness of breath or wheezing.)   [DISCONTINUED] albuterol (VENTOLIN HFA) 108 (90 Base) MCG/ACT inhaler INHALE 2 PUFFS INTO THE LUNGS EVERY 6 HOURS FOR UP TO 90 DOSES AS NEEDED FOR WHEEZING (Patient taking differently: Inhale 2 puffs into the lungs every 6 (six) hours as needed for shortness of breath or wheezing.)   [DISCONTINUED] amLODipine (NORVASC) 10 MG tablet TAKE 1 TABLET(10 MG) BY MOUTH DAILY (Patient taking differently: Take 10 mg by mouth daily.)   [DISCONTINUED] amoxicillin-clavulanate (AUGMENTIN) 875-125 MG tablet Take 1 tablet by mouth 2 (two) times daily.   [DISCONTINUED] cetirizine (ZYRTEC) 10 MG tablet TAKE 1 TABLET(10 MG) BY MOUTH DAILY AS NEEDED FOR ALLERGIES (Patient taking differently: Take 10 mg by mouth daily.)   [DISCONTINUED] esomeprazole (NEXIUM) 40 MG capsule TAKE 1 CAPSULE(40 MG) BY MOUTH DAILY (Patient taking differently: Take 40 mg by mouth daily.)   [DISCONTINUED] fluticasone (FLONASE) 50 MCG/ACT nasal spray Place 2 sprays into both nostrils daily as needed. allergies (Patient taking differently: Place 2 sprays into both nostrils daily.)   albuterol (PROVENTIL) (2.5 MG/3ML) 0.083% nebulizer solution USE 1 VIAL VIA NEBULIZER EVERY 6 HOURS AS NEEDED FOR WHEEZING OR SHORTNESS OF BREATH   albuterol (VENTOLIN HFA) 108 (90 Base) MCG/ACT inhaler INHALE 2 PUFFS INTO THE LUNGS EVERY 6 HOURS FOR UP TO 90 DOSES AS NEEDED FOR WHEEZING   amLODipine (NORVASC) 10 MG tablet TAKE 1 TABLET(10 MG) BY MOUTH DAILY   amoxicillin-clavulanate (AUGMENTIN) 875-125 MG tablet Take 1 tablet by mouth 2 (two) times daily.   cetirizine (ZYRTEC) 10 MG tablet TAKE 1 TABLET(10 MG) BY MOUTH DAILY AS NEEDED FOR ALLERGIES   esomeprazole (NEXIUM) 40 MG capsule Take 1 capsule (40 mg total) by mouth daily.   fluticasone (FLONASE) 50 MCG/ACT nasal spray  Place 2 sprays into both nostrils daily as needed. allergies   No facility-administered encounter medications on file as of 09/26/2021.     Review of Systems  Review of Systems  Constitutional: Negative.   HENT: Negative.    Cardiovascular: Negative.   Gastrointestinal: Negative.   Allergic/Immunologic: Negative.   Neurological: Negative.   Psychiatric/Behavioral: Negative.         Physical Exam  BP (!) 154/97 (BP Location: Left Arm, Patient Position: Sitting, Cuff Size: Large)   Pulse 87   Temp 98.1 F (36.7 C)   Ht '5\' 7"'$  (1.702 m)   Wt 245 lb 12.8 oz (111.5 kg)   LMP 08/24/2015 Comment: irregular periods per pt 09/12/15  SpO2 100%   BMI 38.50 kg/m   Wt Readings from Last 5 Encounters:  09/26/21 245 lb 12.8 oz (111.5 kg)  09/13/21 242 lb (109.8 kg)  07/20/21 252 lb (114.3 kg)  05/28/21 247 lb 6.4 oz (112.2 kg)  03/19/21 246 lb 6.4 oz (111.8 kg)  Physical Exam Vitals and nursing note reviewed.  Constitutional:      General: She is not in acute distress.    Appearance: She is well-developed.  Cardiovascular:     Rate and Rhythm: Normal rate and regular rhythm.  Pulmonary:     Effort: Pulmonary effort is normal.     Breath sounds: Normal breath sounds.  Neurological:     Mental Status: She is alert and oriented to person, place, and time.      Lab Results:  CBC    Component Value Date/Time   WBC 9.0 09/13/2021 1027   RBC 5.07 09/13/2021 1027   HGB 12.9 09/13/2021 1027   HGB 13.4 12/15/2020 1055   HCT 37.2 09/13/2021 1027   HCT 40.4 12/15/2020 1055   PLT 257 09/13/2021 1027   PLT 239 12/15/2020 1055   MCV 73.4 (L) 09/13/2021 1027   MCV 77 (L) 12/15/2020 1055   MCH 25.4 (L) 09/13/2021 1027   MCHC 34.7 09/13/2021 1027   RDW 14.4 09/13/2021 1027   RDW 15.3 12/15/2020 1055   LYMPHSABS 2.0 12/15/2020 1055   MONOABS 0.7 09/10/2019 1534   EOSABS 0.1 12/15/2020 1055   BASOSABS 0.1 12/15/2020 1055    BMET    Component Value Date/Time   NA 136  09/13/2021 1027   NA 144 03/19/2021 1124   K 3.3 (L) 09/13/2021 1027   CL 103 09/13/2021 1027   CO2 27 09/13/2021 1027   GLUCOSE 127 (H) 09/13/2021 1027   BUN 13 09/13/2021 1027   BUN 14 03/19/2021 1124   CREATININE 0.88 09/13/2021 1027   CALCIUM 9.1 09/13/2021 1027   GFRNONAA >60 09/13/2021 1027   GFRAA >60 09/10/2019 1534    BNP No results found for: "BNP"  ProBNP    Component Value Date/Time   PROBNP 32.0 05/12/2009 1140    Imaging: MR LUMBAR SPINE WO CONTRAST  Result Date: 09/13/2021 CLINICAL DATA:  Low back pain.  Cauda equina syndrome suspected. EXAM: MRI LUMBAR SPINE WITHOUT CONTRAST TECHNIQUE: Multiplanar, multisequence MR imaging of the lumbar spine was performed. No intravenous contrast was administered. COMPARISON:  Lumbar spine radiographs 01/15/2017. Lumbar MRI 11/27/2004 FINDINGS: Segmentation:  5 lumbar vertebra. Alignment:  Normal Vertebrae:  Normal bone marrow.  Negative for fracture or mass Conus medullaris and cauda equina: Conus extends to the L1-2 level. Conus and cauda equina appear normal. Paraspinal and other soft tissues: Small bilateral renal cysts. No retroperitoneal adenopathy 4 cm mass in the pelvis to the right of midline. On sagittal images this appears to be associated with the uterine fundus and is most consistent with uterine fibroid. This is confirmed on prior ultrasound 09/18/2017 Disc levels: L1-2: Negative L2-3: Negative L3-4: Small right foraminal disc protrusion. Mild flattening of the right L3 nerve root. Central canal patent. Early facet degeneration L4-5: Small to moderate central disc protrusion indenting the thecal sac but not causing any significant central canal stenosis. Small right foraminal disc protrusion with displacement of the right L4 nerve root. L5-S1: Right foraminal disc protrusion with impingement of the right L5 nerve root. Bilateral facet degeneration. IMPRESSION: Small right foraminal disc protrusion at L3-4 Central disc  protrusion and right foraminal disc protrusion at L4-5. Right foraminal disc protrusion L5-S1 with right L5 nerve root impingement 4 cm mass in the pelvis consistent with uterine fibroid as noted on prior ultrasound. Electronically Signed   By: Franchot Gallo M.D.   On: 09/13/2021 12:15     Assessment & Plan:   Lumbar radiculopathy -  Ambulatory referral to Neurosurgery  2. Abnormal MRI  - Ambulatory referral to Neurosurgery  3. Sarcoidosis  - albuterol (PROVENTIL) (2.5 MG/3ML) 0.083% nebulizer solution; USE 1 VIAL VIA NEBULIZER EVERY 6 HOURS AS NEEDED FOR WHEEZING OR SHORTNESS OF BREATH  Dispense: 75 mL; Refill: 1 - albuterol (VENTOLIN HFA) 108 (90 Base) MCG/ACT inhaler; INHALE 2 PUFFS INTO THE LUNGS EVERY 6 HOURS FOR UP TO 90 DOSES AS NEEDED FOR WHEEZING  Dispense: 6.7 g; Refill: 1  4. Mild intermittent asthma without complication  - albuterol (PROVENTIL) (2.5 MG/3ML) 0.083% nebulizer solution; USE 1 VIAL VIA NEBULIZER EVERY 6 HOURS AS NEEDED FOR WHEEZING OR SHORTNESS OF BREATH  Dispense: 75 mL; Refill: 1 - albuterol (VENTOLIN HFA) 108 (90 Base) MCG/ACT inhaler; INHALE 2 PUFFS INTO THE LUNGS EVERY 6 HOURS FOR UP TO 90 DOSES AS NEEDED FOR WHEEZING  Dispense: 6.7 g; Refill: 1  5. Essential hypertension  - amLODipine (NORVASC) 10 MG tablet; TAKE 1 TABLET(10 MG) BY MOUTH DAILY  Dispense: 90 tablet; Refill: 3  6. Allergic rhinitis, unspecified seasonality, unspecified trigger  - cetirizine (ZYRTEC) 10 MG tablet; TAKE 1 TABLET(10 MG) BY MOUTH DAILY AS NEEDED FOR ALLERGIES  Dispense: 30 tablet; Refill: 2 - fluticasone (FLONASE) 50 MCG/ACT nasal spray; Place 2 sprays into both nostrils daily as needed. allergies  Dispense: 9.9 mL; Refill: 11  7. Gastroesophageal reflux disease without esophagitis  - esomeprazole (NEXIUM) 40 MG capsule; Take 1 capsule (40 mg total) by mouth daily.  Dispense: 90 capsule; Refill: 0   8. Hidradenitis suppurativa  - clindamycin (CLINDAGEL) 1 % gel; Apply  topically 2 (two) times daily.  Dispense: 30 g; Refill: 0 - amoxicillin-clavulanate (AUGMENTIN) 875-125 MG tablet; Take 1 tablet by mouth 2 (two) times daily.  Dispense: 20 tablet; Refill: 0   Follow up:  Follow up in 6 months or sooner if needed     Fenton Foy, NP 09/26/2021

## 2021-09-26 NOTE — Patient Instructions (Addendum)
1. Lumbar radiculopathy  - Ambulatory referral to Neurosurgery  2. Abnormal MRI  - Ambulatory referral to Neurosurgery  3. Sarcoidosis  - albuterol (PROVENTIL) (2.5 MG/3ML) 0.083% nebulizer solution; USE 1 VIAL VIA NEBULIZER EVERY 6 HOURS AS NEEDED FOR WHEEZING OR SHORTNESS OF BREATH  Dispense: 75 mL; Refill: 1 - albuterol (VENTOLIN HFA) 108 (90 Base) MCG/ACT inhaler; INHALE 2 PUFFS INTO THE LUNGS EVERY 6 HOURS FOR UP TO 90 DOSES AS NEEDED FOR WHEEZING  Dispense: 6.7 g; Refill: 1  4. Mild intermittent asthma without complication  - albuterol (PROVENTIL) (2.5 MG/3ML) 0.083% nebulizer solution; USE 1 VIAL VIA NEBULIZER EVERY 6 HOURS AS NEEDED FOR WHEEZING OR SHORTNESS OF BREATH  Dispense: 75 mL; Refill: 1 - albuterol (VENTOLIN HFA) 108 (90 Base) MCG/ACT inhaler; INHALE 2 PUFFS INTO THE LUNGS EVERY 6 HOURS FOR UP TO 90 DOSES AS NEEDED FOR WHEEZING  Dispense: 6.7 g; Refill: 1  5. Essential hypertension  - amLODipine (NORVASC) 10 MG tablet; TAKE 1 TABLET(10 MG) BY MOUTH DAILY  Dispense: 90 tablet; Refill: 3  6. Allergic rhinitis, unspecified seasonality, unspecified trigger  - cetirizine (ZYRTEC) 10 MG tablet; TAKE 1 TABLET(10 MG) BY MOUTH DAILY AS NEEDED FOR ALLERGIES  Dispense: 30 tablet; Refill: 2 - fluticasone (FLONASE) 50 MCG/ACT nasal spray; Place 2 sprays into both nostrils daily as needed. allergies  Dispense: 9.9 mL; Refill: 11  7. Gastroesophageal reflux disease without esophagitis  - esomeprazole (NEXIUM) 40 MG capsule; Take 1 capsule (40 mg total) by mouth daily.  Dispense: 90 capsule; Refill: 0   8. Hidradenitis suppurativa  - clindamycin (CLINDAGEL) 1 % gel; Apply topically 2 (two) times daily.  Dispense: 30 g; Refill: 0 - amoxicillin-clavulanate (AUGMENTIN) 875-125 MG tablet; Take 1 tablet by mouth 2 (two) times daily.  Dispense: 20 tablet; Refill: 0   Follow up:  Follow up in 6 months or sooner if needed

## 2021-10-22 ENCOUNTER — Other Ambulatory Visit: Payer: Self-pay | Admitting: Nurse Practitioner

## 2021-10-22 MED ORDER — LINZESS 72 MCG PO CAPS: 72.0000 ug | ORAL_CAPSULE | Freq: Every day | ORAL | 0 refills | Status: DC

## 2021-11-19 ENCOUNTER — Other Ambulatory Visit: Payer: Self-pay | Admitting: Nurse Practitioner

## 2021-11-19 DIAGNOSIS — L732 Hidradenitis suppurativa: Secondary | ICD-10-CM

## 2021-11-19 MED ORDER — AMOXICILLIN-POT CLAVULANATE 875-125 MG PO TABS: 1.0000 | ORAL_TABLET | Freq: Two times a day (BID) | ORAL | 0 refills | Status: DC

## 2021-11-23 ENCOUNTER — Other Ambulatory Visit: Payer: Self-pay | Admitting: Nurse Practitioner

## 2021-11-23 DIAGNOSIS — L732 Hidradenitis suppurativa: Secondary | ICD-10-CM

## 2021-11-23 MED ORDER — THERA M PLUS PO TABS: 1.0000 | ORAL_TABLET | Freq: Every day | ORAL | 0 refills | Status: DC

## 2021-11-23 MED ORDER — CLINDAMYCIN PHOSPHATE 1 % EX GEL: Freq: Two times a day (BID) | CUTANEOUS | 0 refills | Status: AC

## 2021-11-26 NOTE — Telephone Encounter (Signed)
Called pt and she advised that she does need the diflucan refilled . Please advise if you would like pt to be seen. [place]

## 2021-12-22 ENCOUNTER — Emergency Department (HOSPITAL_COMMUNITY): Payer: Medicare Other

## 2021-12-22 ENCOUNTER — Other Ambulatory Visit: Payer: Self-pay

## 2021-12-22 ENCOUNTER — Observation Stay (HOSPITAL_COMMUNITY)
Admission: EM | Admit: 2021-12-22 | Discharge: 2021-12-23 | Disposition: A | Discharge status: Home / Self Care | Payer: Medicare Other | Attending: Internal Medicine | Admitting: Internal Medicine

## 2021-12-22 ENCOUNTER — Encounter (HOSPITAL_COMMUNITY): Payer: Self-pay | Admitting: Emergency Medicine

## 2021-12-22 DIAGNOSIS — D721 Eosinophilia, unspecified: Secondary | ICD-10-CM | POA: Diagnosis not present

## 2021-12-22 DIAGNOSIS — R944 Abnormal results of kidney function studies: Secondary | ICD-10-CM | POA: Diagnosis not present

## 2021-12-22 DIAGNOSIS — I10 Essential (primary) hypertension: Secondary | ICD-10-CM | POA: Insufficient documentation

## 2021-12-22 DIAGNOSIS — J45909 Unspecified asthma, uncomplicated: Secondary | ICD-10-CM | POA: Diagnosis not present

## 2021-12-22 DIAGNOSIS — N179 Acute kidney failure, unspecified: Secondary | ICD-10-CM | POA: Diagnosis not present

## 2021-12-22 DIAGNOSIS — D509 Iron deficiency anemia, unspecified: Secondary | ICD-10-CM | POA: Insufficient documentation

## 2021-12-22 DIAGNOSIS — R1032 Left lower quadrant pain: Secondary | ICD-10-CM | POA: Diagnosis present

## 2021-12-22 DIAGNOSIS — Z79899 Other long term (current) drug therapy: Secondary | ICD-10-CM | POA: Insufficient documentation

## 2021-12-22 DIAGNOSIS — K5792 Diverticulitis of intestine, part unspecified, without perforation or abscess without bleeding: Principal | ICD-10-CM | POA: Diagnosis present

## 2021-12-22 DIAGNOSIS — K5732 Diverticulitis of large intestine without perforation or abscess without bleeding: Secondary | ICD-10-CM | POA: Diagnosis not present

## 2021-12-22 DIAGNOSIS — E559 Vitamin D deficiency, unspecified: Secondary | ICD-10-CM | POA: Insufficient documentation

## 2021-12-22 LAB — COMPREHENSIVE METABOLIC PANEL
ALT: 20 U/L (ref 0–44)
AST: 21 U/L (ref 15–41)
Albumin: 3.4 g/dL — ABNORMAL LOW (ref 3.5–5.0)
Alkaline Phosphatase: 50 U/L (ref 38–126)
Anion gap: 10 (ref 5–15)
BUN: 11 mg/dL (ref 6–20)
CO2: 28 mmol/L (ref 22–32)
Calcium: 9.3 mg/dL (ref 8.9–10.3)
Chloride: 100 mmol/L (ref 98–111)
Creatinine, Ser: 1.03 mg/dL — ABNORMAL HIGH (ref 0.44–1.00)
GFR, Estimated: >60 mL/min (ref >60)
Glucose, Bld: 144 mg/dL — ABNORMAL HIGH (ref 70–99)
Potassium: 3.3 mmol/L — ABNORMAL LOW (ref 3.5–5.1)
Sodium: 138 mmol/L (ref 135–145)
Total Bilirubin: 0.5 mg/dL (ref 0.3–1.2)
Total Protein: 7.2 g/dL (ref 6.5–8.1)

## 2021-12-22 LAB — CBC WITH DIFFERENTIAL/PLATELET
Abs Immature Granulocytes: 0.08 10*3/uL — ABNORMAL HIGH (ref 0.00–0.07)
Basophils Absolute: 0.1 10*3/uL (ref 0.0–0.1)
Basophils Relative: 1 %
Eosinophils Absolute: 0.6 10*3/uL — ABNORMAL HIGH (ref 0.0–0.5)
Eosinophils Relative: 5 %
HCT: 33.3 % — ABNORMAL LOW (ref 36.0–46.0)
Hemoglobin: 11.7 g/dL — ABNORMAL LOW (ref 12.0–15.0)
Immature Granulocytes: 1 %
Lymphocytes Relative: 18 %
Lymphs Abs: 2.3 10*3/uL (ref 0.7–4.0)
MCH: 25.3 pg — ABNORMAL LOW (ref 26.0–34.0)
MCHC: 35.1 g/dL (ref 30.0–36.0)
MCV: 72.1 fL — ABNORMAL LOW (ref 80.0–100.0)
Monocytes Absolute: 0.8 10*3/uL (ref 0.1–1.0)
Monocytes Relative: 6 %
Neutro Abs: 9.2 10*3/uL — ABNORMAL HIGH (ref 1.7–7.7)
Neutrophils Relative %: 69 %
Platelets: 279 10*3/uL (ref 150–400)
RBC: 4.62 MIL/uL (ref 3.87–5.11)
RDW: 14.1 % (ref 11.5–15.5)
WBC: 13 10*3/uL — ABNORMAL HIGH (ref 4.0–10.5)
nRBC: 0 % (ref 0.0–0.2)

## 2021-12-22 LAB — URINALYSIS, ROUTINE W REFLEX MICROSCOPIC
Bilirubin Urine: NEGATIVE
Glucose, UA: NEGATIVE mg/dL
Hgb urine dipstick: NEGATIVE
Ketones, ur: NEGATIVE mg/dL
Leukocytes,Ua: NEGATIVE
Nitrite: NEGATIVE
Protein, ur: NEGATIVE mg/dL
Specific Gravity, Urine: 1.014 (ref 1.005–1.030)
pH: 6 (ref 5.0–8.0)

## 2021-12-22 LAB — LACTIC ACID, PLASMA: Lactic Acid, Venous: 1.2 mmol/L (ref 0.5–1.9)

## 2021-12-22 LAB — HEMOGLOBIN A1C
Hgb A1c MFr Bld: 6.2 % — ABNORMAL HIGH (ref 4.8–5.6)
Mean Plasma Glucose: 131.24 mg/dL

## 2021-12-22 LAB — LIPASE, BLOOD: Lipase: 36 U/L (ref 11–51)

## 2021-12-22 LAB — HIV ANTIBODY (ROUTINE TESTING W REFLEX): HIV Screen 4th Generation wRfx: NONREACTIVE

## 2021-12-22 MED ORDER — HYDROMORPHONE HCL 1 MG/ML IJ SOLN
1.0000 mg | Freq: Once | INTRAMUSCULAR | Status: AC
  Administered 2021-12-22: 1 mg via INTRAVENOUS
  Filled 2021-12-22: qty 1

## 2021-12-22 MED ORDER — KETOROLAC TROMETHAMINE 30 MG/ML IJ SOLN
30.0000 mg | Freq: Three times a day (TID) | INTRAMUSCULAR | Status: DC
  Administered 2021-12-22 – 2021-12-23 (×5): 30 mg via INTRAVENOUS
  Filled 2021-12-22 (×5): qty 1

## 2021-12-22 MED ORDER — ENOXAPARIN SODIUM 40 MG/0.4ML IJ SOSY
40.0000 mg | PREFILLED_SYRINGE | INTRAMUSCULAR | Status: DC
  Administered 2021-12-22 – 2021-12-23 (×2): 40 mg via SUBCUTANEOUS
  Filled 2021-12-22 (×2): qty 0.4

## 2021-12-22 MED ORDER — ONDANSETRON HCL 4 MG PO TABS: 4.0000 mg | ORAL_TABLET | Freq: Four times a day (QID) | ORAL | Status: DC | PRN

## 2021-12-22 MED ORDER — LORATADINE 10 MG PO TABS
10.0000 mg | ORAL_TABLET | Freq: Every day | ORAL | Status: DC
  Administered 2021-12-23: 10 mg via ORAL
  Filled 2021-12-22: qty 1

## 2021-12-22 MED ORDER — PIPERACILLIN-TAZOBACTAM 3.375 G IVPB 30 MIN
3.3750 g | Freq: Once | INTRAVENOUS | Status: AC
  Administered 2021-12-22: 3.375 g via INTRAVENOUS
  Filled 2021-12-22: qty 50

## 2021-12-22 MED ORDER — ONDANSETRON HCL 4 MG/2ML IJ SOLN
4.0000 mg | Freq: Once | INTRAMUSCULAR | Status: DC
  Filled 2021-12-22: qty 2

## 2021-12-22 MED ORDER — LACTATED RINGERS IV BOLUS
1000.0000 mL | Freq: Once | INTRAVENOUS | Status: AC
  Administered 2021-12-22: 1000 mL via INTRAVENOUS

## 2021-12-22 MED ORDER — METHOCARBAMOL 500 MG PO TABS: 500.0000 mg | ORAL_TABLET | Freq: Four times a day (QID) | ORAL | Status: DC | PRN

## 2021-12-22 MED ORDER — CLINDAMYCIN PHOSPHATE 1 % EX GEL
Freq: Two times a day (BID) | CUTANEOUS | Status: DC
  Filled 2021-12-22: qty 30

## 2021-12-22 MED ORDER — ACETAMINOPHEN 325 MG PO TABS
650.0000 mg | ORAL_TABLET | Freq: Four times a day (QID) | ORAL | Status: DC | PRN
  Administered 2021-12-22: 650 mg via ORAL
  Filled 2021-12-22: qty 2

## 2021-12-22 MED ORDER — CLINDAMYCIN PHOSPHATE 1 % EX GEL: Freq: Two times a day (BID) | CUTANEOUS | Status: DC | PRN

## 2021-12-22 MED ORDER — AMLODIPINE BESYLATE 10 MG PO TABS
10.0000 mg | ORAL_TABLET | Freq: Every day | ORAL | Status: DC
  Administered 2021-12-23: 10 mg via ORAL
  Filled 2021-12-22: qty 1

## 2021-12-22 MED ORDER — ONDANSETRON HCL 4 MG/2ML IJ SOLN
4.0000 mg | Freq: Four times a day (QID) | INTRAMUSCULAR | Status: DC | PRN
  Administered 2021-12-22: 4 mg via INTRAVENOUS

## 2021-12-22 MED ORDER — PIPERACILLIN-TAZOBACTAM 3.375 G IVPB
3.3750 g | Freq: Three times a day (TID) | INTRAVENOUS | Status: DC
  Administered 2021-12-22 – 2021-12-23 (×4): 3.375 g via INTRAVENOUS
  Filled 2021-12-22 (×4): qty 50

## 2021-12-22 MED ORDER — ALBUTEROL SULFATE (2.5 MG/3ML) 0.083% IN NEBU: 2.5000 mg | INHALATION_SOLUTION | Freq: Four times a day (QID) | RESPIRATORY_TRACT | Status: DC | PRN

## 2021-12-22 MED ORDER — HYDROCODONE-ACETAMINOPHEN 5-325 MG PO TABS
1.0000 | ORAL_TABLET | ORAL | Status: DC | PRN
  Administered 2021-12-22: 2 via ORAL
  Filled 2021-12-22: qty 2

## 2021-12-22 MED ORDER — PANTOPRAZOLE SODIUM 40 MG PO TBEC
40.0000 mg | DELAYED_RELEASE_TABLET | Freq: Every day | ORAL | Status: DC
  Administered 2021-12-22 – 2021-12-23 (×2): 40 mg via ORAL
  Filled 2021-12-22 (×2): qty 1

## 2021-12-22 MED ORDER — ACETAMINOPHEN 650 MG RE SUPP: 650.0000 mg | Freq: Four times a day (QID) | RECTAL | Status: DC | PRN

## 2021-12-22 MED ORDER — FLUTICASONE PROPIONATE 50 MCG/ACT NA SUSP: 2.0000 | Freq: Every day | NASAL | Status: DC | PRN

## 2021-12-22 MED ORDER — HYDROMORPHONE HCL 1 MG/ML IJ SOLN
0.5000 mg | INTRAMUSCULAR | Status: DC | PRN
  Administered 2021-12-22: 0.5 mg via INTRAVENOUS
  Administered 2021-12-23 (×2): 1 mg via INTRAVENOUS
  Filled 2021-12-22 (×3): qty 1

## 2021-12-22 NOTE — ED Notes (Signed)
Pt's Sa02 was 85% on RA. Placed pt on 3L 02 per [place]. Pt's Sa02 now 99%

## 2021-12-22 NOTE — ED Provider Notes (Signed)
7:48 AM Care assumed from Dr. Dayna Barker, at time of transfer of care, patient is awaiting callback for admission for recurrent diverticulitis    Bassam Dresch, Gwenyth Allegra, MD 12/22/21 1555

## 2021-12-22 NOTE — ED Provider Notes (Signed)
[facility] EMERGENCY DEPARTMENT Provider Note   CSN: [id number] Arrival date & time: 12/22/21  0210     History  Chief Complaint  Patient presents with   Near Syncope   Abdominal Pain    Crystal Bullock is a 56 y.o. female.  56 year old female who presents the ER today with left quadrant pain severe in nature started earlier today got so bad that she thought she did not pass out she called EMS and brought here for further evaluation.  Patient states she has a history of diverticulitis this feels similar but seems to be worse.  She states in the past she is taking Augmentin, pain medicine and steroids.  She tried taking oxycodone before, but it did not help much.  Some nausea no vomiting.  She states that she did take a dose of a stool softener because she took the Percocet and did not want to get constipation so had loose stools but no diarrhea. Fever of 101 PTA.    Near Syncope Associated symptoms include abdominal pain.  Abdominal Pain      Home Medications Prior to Admission medications   Medication Sig Start Date End Date Taking? Authorizing Provider  albuterol (PROVENTIL) (2.5 MG/3ML) 0.083% nebulizer solution USE 1 VIAL VIA NEBULIZER EVERY 6 HOURS AS NEEDED FOR WHEEZING OR SHORTNESS OF BREATH Patient taking differently: Take 2.5 mg by nebulization every 6 (six) hours as needed for wheezing or shortness of breath. 09/26/21  Yes Fenton Foy, NP  albuterol (VENTOLIN HFA) 108 (90 Base) MCG/ACT inhaler INHALE 2 PUFFS INTO THE LUNGS EVERY 6 HOURS FOR UP TO 90 DOSES AS NEEDED FOR WHEEZING Patient taking differently: Inhale 2 puffs into the lungs every 6 (six) hours as needed for shortness of breath. 09/26/21  Yes Fenton Foy, NP  amLODipine (NORVASC) 10 MG tablet TAKE 1 TABLET(10 MG) BY MOUTH DAILY Patient taking differently: Take 10 mg by mouth daily. 09/26/21  Yes Fenton Foy, NP  amoxicillin-clavulanate (AUGMENTIN) 875-125 MG tablet Take 1  tablet by mouth 2 (two) times daily. 11/19/21  Yes Fenton Foy, NP  baclofen (LIORESAL) 10 MG tablet Take 10 mg by mouth 2 (two) times daily as needed for muscle spasms. 03/02/21  Yes [provider]  busPIRone (BUSPAR) 7.5 MG tablet Take 1 tablet (7.5 mg total) by mouth 3 (three) times daily. Patient taking differently: Take 7.5 mg by mouth 2 (two) times daily as needed (anxiety). 08/24/18  Yes Lanae Boast, FNP  cetirizine (ZYRTEC) 10 MG tablet TAKE 1 TABLET(10 MG) BY MOUTH DAILY AS NEEDED FOR ALLERGIES Patient taking differently: Take 10 mg by mouth daily as needed for allergies. TAKE 1 TABLET(10 MG) BY MOUTH DAILY AS NEEDED FOR ALLERGIES 09/26/21  Yes Fenton Foy, NP  clindamycin (CLINDAGEL) 1 % gel Apply topically 2 (two) times daily. 11/23/21  Yes Fenton Foy, NP  esomeprazole (NEXIUM) 40 MG capsule Take 1 capsule (40 mg total) by mouth daily. 09/26/21 12/25/21 Yes Fenton Foy, NP  ibuprofen (ADVIL) 600 MG tablet Take 1 tablet (600 mg total) by mouth every 6 (six) hours as needed for moderate pain. 02/11/21  Yes Domenic Moras, PA-C  LINZESS 72 MCG capsule Take 1 capsule (72 mcg total) by mouth daily. 10/22/21  Yes Fenton Foy, NP  methocarbamol (ROBAXIN) 500 MG tablet Take 1 tablet (500 mg total) by mouth every 6 (six) hours as needed for muscle spasms. 09/13/21  Yes Charlesetta Shanks, MD  Multiple Vitamins-Minerals (MULTIVITAMINS  THER. W/MINERALS) TABS tablet Take 1 tablet by mouth daily. 11/23/21  Yes Fenton Foy, NP  nystatin ointment (MYCOSTATIN) Apply 1 application. topically 2 (two) times daily. 07/03/21  Yes Passmore, Jake Church I, NP  oxyCODONE-acetaminophen (PERCOCET) 10-325 MG tablet Take 1 tablet by mouth every 8 (eight) hours as needed for pain.   Yes [provider]  sucralfate (CARAFATE) 1 g tablet Take 1 tablet (1 g total) by mouth 4 (four) times daily as needed. Patient taking differently: Take 1 g by mouth 4 (four) times daily as needed (for  diverticuolitis). 07/22/21  Yes Maudie Flakes, MD  vitamin C (ASCORBIC ACID) 500 MG tablet Take 500 mg by mouth daily.   Yes [provider]  Vitamin D, Ergocalciferol, (DRISDOL) 1.25 MG (50000 UNIT) CAPS capsule Take 1 capsule (50,000 Units total) by mouth every 7 (seven) days. Patient taking differently: Take 50,000 Units by mouth every Wednesday. 04/10/20  Yes Azzie Glatter, FNP  cyclobenzaprine (FLEXERIL) 10 MG tablet Take 1 tablet (10 mg total) by mouth 3 (three) times daily as needed for muscle spasms. 02/11/21   Domenic Moras, PA-C  fluticasone (FLONASE) 50 MCG/ACT nasal spray Place 2 sprays into both nostrils daily as needed. allergies 09/26/21   Fenton Foy, NP  hydrOXYzine (VISTARIL) 50 MG capsule Take as prescribed as AVS. Patient not taking: Reported on 12/22/2021 04/07/19   Vevelyn Francois, NP  NON FORMULARY CPAP at bedtime Patient not taking: Reported on 12/22/2021    [provider]      Allergies    Bee venom and Iodinated contrast media    Review of Systems   Review of Systems  Cardiovascular:  Positive for near-syncope.  Gastrointestinal:  Positive for abdominal pain.    Physical Exam Updated Vital Signs BP 136/86   Pulse 82   Temp 98.1 F (36.7 C) (Oral)   Resp 18   LMP 08/24/2015 Comment: irregular periods per pt 09/12/15  SpO2 97%  Physical Exam Vitals and nursing note reviewed.  Constitutional:      Appearance: She is well-developed.  HENT:     Head: Normocephalic and atraumatic.  Cardiovascular:     Rate and Rhythm: Normal rate and regular rhythm.  Pulmonary:     Effort: No respiratory distress.     Breath sounds: No stridor.  Abdominal:     General: Bowel sounds are normal. There is no distension.     Tenderness: There is abdominal tenderness in the left lower quadrant. There is no guarding or rebound.  Musculoskeletal:        General: Normal range of motion.     Cervical back: Normal range of motion.  Skin:    General: Skin  is warm and dry.  Neurological:     General: No focal deficit present.     Mental Status: She is alert.     ED Results / Procedures / Treatments   Labs (all labs ordered are listed, but only abnormal results are displayed) Labs Reviewed  CBC WITH DIFFERENTIAL/PLATELET - Abnormal; Notable for the following components:      Result Value   WBC 13.0 (*)    Hemoglobin 11.7 (*)    HCT 33.3 (*)    MCV 72.1 (*)    MCH 25.3 (*)    Neutro Abs 9.2 (*)    Eosinophils Absolute 0.6 (*)    Abs Immature Granulocytes 0.08 (*)    All other components within normal limits  COMPREHENSIVE METABOLIC PANEL - Abnormal;  Notable for the following components:   Potassium 3.3 (*)    Glucose, Bld 144 (*)    Creatinine, Ser 1.03 (*)    Albumin 3.4 (*)    All other components within normal limits  LACTIC ACID, PLASMA  LIPASE, BLOOD  URINALYSIS, ROUTINE W REFLEX MICROSCOPIC    EKG EKG Interpretation  Date/Time:  Saturday December 22 2021 02:24:29 EST Ventricular Rate:  88 PR Interval:  163 QRS Duration: 151 QT Interval:  408 QTC Calculation: 494 R Axis:   87 Text Interpretation: Sinus rhythm Probable left atrial enlargement Right bundle branch block Similar to multiple previous ECGs Confirmed by Merrily Pew [phone]) on 12/22/2021 2:25:24 AM  Radiology CT ABDOMEN PELVIS WO CONTRAST  Result Date: 12/22/2021 CLINICAL DATA:  56 year old female with lower abdominal pain since 1700 hours. Nausea. EXAM: CT ABDOMEN AND PELVIS WITHOUT CONTRAST TECHNIQUE: Multidetector CT imaging of the abdomen and pelvis was performed following the standard protocol without IV contrast. RADIATION DOSE REDUCTION: This exam was performed according to the departmental dose-optimization program which includes automated exposure control, adjustment of the mA and/or kV according to patient size and/or use of iterative reconstruction technique. COMPARISON:  Noncontrast CT Abdomen and Pelvis 09/10/2019 and earlier. FINDINGS: Lower  chest: Cardiac size is stable and within normal limits. No pericardial or pleural effusion. Lower lung volumes with mild lung base atelectasis. Hepatobiliary: Stable noncontrast liver.  Chronic cholecystectomy. Pancreas: Negative noncontrast pancreas. Spleen: Negative. Adrenals/Urinary Tract: Negative adrenal glands. Mild nephrolithiasis. Chronic right lower pole renal cyst does not appear significantly changed since 2017 and benign (no follow-up imaging recommended). No hydronephrosis. Decompressed ureters. Chronic right hemipelvis phlebolith. Negative bladder. Stomach/Bowel: Negative rectum. Diverticulosis throughout the sigmoid colon and descending colon, from the splenic flexure to the distal sigmoid. Beginning on series 3, image 57 a 6-7 cm segment of thickened and inflamed large bowel at the junction of the descending and sigmoid colon is demonstrated (coronal image 76) with numerous diverticula in the region. Moderate to severe associated bowel wall thickening, with loss of the bowel lumen. No extraluminal gas identified. Trace fluid layering in the left gutter. No dilated upstream large bowel. Transverse and right colon are negative, with normal appendix arising on series 3, image 70. Decompressed terminal ileum. No dilated small bowel. Decompressed stomach and duodenum. No free air or free fluid. Vascular/Lymphatic: Normal caliber abdominal aorta. Calcified aortic atherosclerosis. No lymphadenopathy identified. Reproductive: Stable noncontrast appearance, bilateral tubal ligation clips. Other: No pelvis free fluid. Musculoskeletal: Chronic degenerative retrolisthesis of L4 on L5 with progressed disc degeneration and new vacuum disc since 2021. No acute osseous abnormality identified. IMPRESSION: 1. Positive for a 7 cm segment of moderate to severe Acute Diverticulitis in the distal descending colon, junction with the sigmoid. No free air, abscess, or bowel obstruction. Recommend up to date colon cancer  screening following resolution of the acute process as carcinoma can sometimes present in a similar fashion. 2. No other acute or inflammatory process identified in the noncontrast abdomen or pelvis. Mild nephrolithiasis.  Aortic Atherosclerosis (ICD10-I70.0). Electronically Signed   By: Genevie Ann M.D.   On: 12/22/2021 05:51    Procedures Procedures    Medications Ordered in ED Medications  HYDROmorphone (DILAUDID) injection 1 mg (1 mg Intravenous Given 12/22/21 0336)  lactated ringers bolus 1,000 mL (0 mLs Intravenous Stopped 12/22/21 0451)  HYDROmorphone (DILAUDID) injection 1 mg (1 mg Intravenous Given 12/22/21 0509)  HYDROmorphone (DILAUDID) injection 1 mg (1 mg Intravenous Given 12/22/21 0724)    ED Course/ Medical  Decision Making/ A&P                           Medical Decision Making Amount and/or Complexity of Data Reviewed Labs: ordered. Radiology: ordered.  Risk Prescription drug management. Decision regarding hospitalization.   CT scan done and reviewed by myself and does have evidence of some haziness in the left lower quadrant.  Confirmed by radiology to have colitis without abscess or perforation.  Antibiotic started but pain unable to be controlled for any period of time with oral pain medicine only the IV Dilaudid is working.  She is requesting steroids however I will feel like this is appropriate and can likely make things worse so we will hold off on that.  After multiple rounds of pain medicines and multiple reevaluations I discussed admit her to the hospital for further pain control until the antibiotics had a chance to cool things off.  She was agreeable.  I discussed with hospitalist for admission.   Final Clinical Impression(s) / ED Diagnoses Final diagnoses:  Diverticulitis    Rx / DC Orders ED Discharge Orders     None         Camisha Srey, Corene Cornea, MD 12/22/21 [phone]

## 2021-12-22 NOTE — Progress Notes (Signed)
Pharmacy Antibiotic Note  Crystal Bullock is a 56 y.o. female admitted on 12/22/2021 presenting with abdominal pain, concern for intra-abdominal infection.  Pharmacy has been consulted for zosyn dosing.  Plan: Zosyn 3.375g IV every 8 hours (extended 4h infusion) Monitor renal function, clinical progression and LOT     Temp (24hrs), Avg:98.7 F (37.1 C), Min:98.1 F (36.7 C), Max:99.2 F (37.3 C)  Recent Labs  Lab 12/22/21 0335  WBC 13.0*  CREATININE 1.03*  LATICACIDVEN 1.2    CrCl cannot be calculated (Unknown ideal weight.).    Allergies  Allergen Reactions   Bee Venom Anaphylaxis   Iodinated Contrast Media Rash    Hives on hands and feet per pt 08/16/15 pt had scan w/ contrast and had a 4 hr premedication protocol done    Bertis Ruddy, PharmD Clinical Pharmacist ED Pharmacist Phone # [phone] 12/22/2021 9:57 AM

## 2021-12-22 NOTE — ED Triage Notes (Signed)
Patient with lower abdominal pain, started around 5pm last evening.  She took a Ambulance person and tried to go to bed.  Lower abdominal pain continued and got worse.  Patient does have some nausea, no vomiting.  Patient states that she was having the feeling of nearly passing out.  She did have some dizziness with the pain.  Patient is CAOx4, history of diverticulitis.

## 2021-12-22 NOTE — Hospital Course (Addendum)
Crystal Bullock is a 56 y.o. female  has a past medical history of Anxiety (10/06/2015), Asthma, Atypical chest pain (10/06/2015), Chronic neck pain, Fibroid, Hypertension, Palpitations (10/06/2015), RBBB (10/06/2015), Sarcoidosis, Scoliosis, Seasonal allergies, Sickle cell trait ([place]), and Vitamin D deficiency (05/2019) as well as diverticulitis in 2017 who presents for LLQ pain.    Patient states that her pain started about 2 days prior to admission after eating. She states she tried home remedy of baking soda which helped briefly. She also tried hydrocodone and linzess which gave her temporary relief. On the day of admission, she woke up and her pain was severe. Her pain got progressively worse prompting her to call EMS. She had a bowel movement the night prior to admission. She also notes fever to 101F, chills, and nausea. She denies emesis, diarrhea, or constipation or dyspnea.   Of note, patient states that she follows with [facility] and has had annual colonoscopies from 2017-2020 after her last episode of acute diverticulitis.   She had a recent appointment 10/2021 and was told she did not need another colonoscopy for another 5 years.   In the ED, she was afebrile HDS, mild leukocytosis to 13. Microcytic anemia 11.7. Mild eosinophilia. CT A/P notable for 7 cm segment of moderate to severe Acute Diverticulitis in the distal descending colon, junction with the sigmoid. No free air, abscess, or bowel obstruction.  PMH: Asthma PRN albuterol Zyrtec Amlodipine '10mg'$  qd Augmentin and clindamycin gel for HS Linzess Percoet for chronic back pain

## 2021-12-22 NOTE — H&P (Cosign Needed Addendum)
Date: 12/22/2021               Patient Name:  Crystal Bullock MRN: [id number]  DOB: 1965-07-12 Age / Sex: 56 y.o., female   PCP: Fenton Foy, NP         Medical Service: Internal Medicine Teaching Service         Attending Physician: Dr. Angelica Pou, MD    First Contact: Dr. Vena Rua  Pager: [phone]  Second Contact: Dr. Buddy Duty  Pager: [phone]       After Hours (After 5p/  First Contact Pager: [phone]  weekends / holidays): Second Contact Pager: [phone]   Chief Complaint: LLQ abdominal pain   History of Present Illness:   Crystal Bullock is a 56 y.o. female  has a past medical history of Anxiety (10/06/2015), Asthma, Atypical chest pain (10/06/2015), Chronic neck pain, Fibroid, Hypertension, Palpitations (10/06/2015), RBBB (10/06/2015), Sarcoidosis, Scoliosis, Seasonal allergies, Sickle cell trait ([place]), and Vitamin D deficiency (05/2019) as well as diverticulitis in 2017 who presents for LLQ pain.    Patient states that her pain started about 2 days prior to admission after eating. She states she tried home remedy of baking soda which helped briefly. She also tried hydrocodone and linzess which gave her temporary relief. On the day of admission, she woke up and her pain was severe. Her pain got progressively worse prompting her to call EMS. She had a bowel movement the night prior to admission. She also notes fever to 101F, chills, and nausea. She denies emesis, diarrhea, or constipation or dyspnea.   Of note, patient states that she follows with [facility] and has had annual colonoscopies from 2017-2020 after her last episode of acute diverticulitis.   She had a recent appointment 10/2021 and was told she did not need another colonoscopy for another 5 years.   In the ED, she was afebrile HDS, mild leukocytosis to 13. Microcytic anemia 11.7. Mild eosinophilia. CT A/P notable for 7 cm segment of moderate to severe Acute Diverticulitis in the distal  descending colon, junction with the sigmoid. No free air, abscess, or bowel obstruction.  PMH: Asthma PRN albuterol Zyrtec Amlodipine '10mg'$  qd clindamycin gel Augmentin  for HS Linzess Percoet for chronic back pain   Meds:  Current Meds  Medication Sig   albuterol (PROVENTIL) (2.5 MG/3ML) 0.083% nebulizer solution USE 1 VIAL VIA NEBULIZER EVERY 6 HOURS AS NEEDED FOR WHEEZING OR SHORTNESS OF BREATH (Patient taking differently: Take 2.5 mg by nebulization every 6 (six) hours as needed for wheezing or shortness of breath.)   albuterol (VENTOLIN HFA) 108 (90 Base) MCG/ACT inhaler INHALE 2 PUFFS INTO THE LUNGS EVERY 6 HOURS FOR UP TO 90 DOSES AS NEEDED FOR WHEEZING (Patient taking differently: Inhale 2 puffs into the lungs every 6 (six) hours as needed for shortness of breath.)   amLODipine (NORVASC) 10 MG tablet TAKE 1 TABLET(10 MG) BY MOUTH DAILY (Patient taking differently: Take 10 mg by mouth daily.)   amoxicillin-clavulanate (AUGMENTIN) 875-125 MG tablet Take 1 tablet by mouth 2 (two) times daily.   baclofen (LIORESAL) 10 MG tablet Take 10 mg by mouth 2 (two) times daily as needed for muscle spasms.   busPIRone (BUSPAR) 7.5 MG tablet Take 1 tablet (7.5 mg total) by mouth 3 (three) times daily. (Patient taking differently: Take 7.5 mg by mouth 2 (two) times daily as needed (anxiety).)   cetirizine (ZYRTEC) 10 MG tablet TAKE 1 TABLET(10 MG) BY MOUTH  DAILY AS NEEDED FOR ALLERGIES (Patient taking differently: Take 10 mg by mouth daily as needed for allergies. TAKE 1 TABLET(10 MG) BY MOUTH DAILY AS NEEDED FOR ALLERGIES)   clindamycin (CLINDAGEL) 1 % gel Apply topically 2 (two) times daily.   esomeprazole (NEXIUM) 40 MG capsule Take 1 capsule (40 mg total) by mouth daily.   ibuprofen (ADVIL) 600 MG tablet Take 1 tablet (600 mg total) by mouth every 6 (six) hours as needed for moderate pain.   LINZESS 72 MCG capsule Take 1 capsule (72 mcg total) by mouth daily.   methocarbamol (ROBAXIN) 500 MG tablet  Take 1 tablet (500 mg total) by mouth every 6 (six) hours as needed for muscle spasms.   Multiple Vitamins-Minerals (MULTIVITAMINS THER. W/MINERALS) TABS tablet Take 1 tablet by mouth daily.   nystatin ointment (MYCOSTATIN) Apply 1 application. topically 2 (two) times daily.   oxyCODONE-acetaminophen (PERCOCET) 10-325 MG tablet Take 1 tablet by mouth every 8 (eight) hours as needed for pain.   sucralfate (CARAFATE) 1 g tablet Take 1 tablet (1 g total) by mouth 4 (four) times daily as needed. (Patient taking differently: Take 1 g by mouth 4 (four) times daily as needed (for diverticuolitis).)   vitamin C (ASCORBIC ACID) 500 MG tablet Take 500 mg by mouth daily.   Vitamin D, Ergocalciferol, (DRISDOL) 1.25 MG (50000 UNIT) CAPS capsule Take 1 capsule (50,000 Units total) by mouth every 7 (seven) days. (Patient taking differently: Take 50,000 Units by mouth every Wednesday.)     Allergies: Allergies as of 12/22/2021 - Review Complete 12/22/2021  Allergen Reaction Noted   Bee venom Anaphylaxis 12/22/2021   Iodinated contrast media Rash 04/24/2011   Past Medical History:  Diagnosis Date   Anxiety 10/06/2015   Asthma    Atypical chest pain 10/06/2015   Chronic neck pain    Fibroid    Hypertension    Palpitations 10/06/2015   RBBB 10/06/2015   Sarcoidosis    Scoliosis    Seasonal allergies    Sickle cell trait ([place])    Vitamin D deficiency 05/2019    Family History:  Family History  Problem Relation Age of Onset   Hypertension Mother    Diabetes Mother    Breast cancer Neg Hx      Social History:  Social History   Tobacco Use   Smoking status: Never   Smokeless tobacco: Never  Vaping Use   Vaping Use: Never used  Substance Use Topics   Alcohol use: Yes    Alcohol/week: 1.0 standard drink of alcohol    Types: 1 Glasses of wine per week    Comment: occasional/holidays   Drug use: Not Currently    Types: Marijuana    Comment: once a week     Review of Systems: A complete  ROS was negative except as per HPI.   Physical Exam: Blood pressure 117/82, pulse 86, temperature 98.9 F (37.2 C), temperature source Oral, resp. rate 19, last menstrual period 08/24/2015, SpO2 100 %. Physical Exam  Constitutional: Well-developed, well-nourished, and uncomfortable  HENT:  Head: Normocephalic and atraumatic. .  Cardiovascular: Normal rate, regular rhythm, intact distal pulses. No gallop and no friction rub.  No murmur heard. No lower extremity edema  Pulmonary: Non labored breathing on room air, no wheezing or rales  Axilla: Linear areas of hyperpigmentation, no drainage, or erythema  Abdominal: Soft. Normal bowel sounds. Non distended. TTP in LLQ, mild TTP in LUQ  Musculoskeletal: Normal range of motion.  General: No tenderness or edema.  Neurological: Alert and oriented to person, place, and time. Non focal  Skin: Skin is warm and dry.    EKG: personally reviewed my interpretation is inverted T waves III, avF, V3, st depression II, V4, V5. Maybe biphasic t waves. RBBB. Stable from prior   CT ABDOMEN PELVIS WO CONTRAST  Result Date: 12/22/2021 CLINICAL DATA:  56 year old female with lower abdominal pain since 1700 hours. Nausea. EXAM: CT ABDOMEN AND PELVIS WITHOUT CONTRAST TECHNIQUE: Multidetector CT imaging of the abdomen and pelvis was performed following the standard protocol without IV contrast. RADIATION DOSE REDUCTION: This exam was performed according to the departmental dose-optimization program which includes automated exposure control, adjustment of the mA and/or kV according to patient size and/or use of iterative reconstruction technique. COMPARISON:  Noncontrast CT Abdomen and Pelvis 09/10/2019 and earlier. FINDINGS: Lower chest: Cardiac size is stable and within normal limits. No pericardial or pleural effusion. Lower lung volumes with mild lung base atelectasis. Hepatobiliary: Stable noncontrast liver.  Chronic cholecystectomy. Pancreas: Negative  noncontrast pancreas. Spleen: Negative. Adrenals/Urinary Tract: Negative adrenal glands. Mild nephrolithiasis. Chronic right lower pole renal cyst does not appear significantly changed since 2017 and benign (no follow-up imaging recommended). No hydronephrosis. Decompressed ureters. Chronic right hemipelvis phlebolith. Negative bladder. Stomach/Bowel: Negative rectum. Diverticulosis throughout the sigmoid colon and descending colon, from the splenic flexure to the distal sigmoid. Beginning on series 3, image 57 a 6-7 cm segment of thickened and inflamed large bowel at the junction of the descending and sigmoid colon is demonstrated (coronal image 76) with numerous diverticula in the region. Moderate to severe associated bowel wall thickening, with loss of the bowel lumen. No extraluminal gas identified. Trace fluid layering in the left gutter. No dilated upstream large bowel. Transverse and right colon are negative, with normal appendix arising on series 3, image 70. Decompressed terminal ileum. No dilated small bowel. Decompressed stomach and duodenum. No free air or free fluid. Vascular/Lymphatic: Normal caliber abdominal aorta. Calcified aortic atherosclerosis. No lymphadenopathy identified. Reproductive: Stable noncontrast appearance, bilateral tubal ligation clips. Other: No pelvis free fluid. Musculoskeletal: Chronic degenerative retrolisthesis of L4 on L5 with progressed disc degeneration and new vacuum disc since 2021. No acute osseous abnormality identified. IMPRESSION: 1. Positive for a 7 cm segment of moderate to severe Acute Diverticulitis in the distal descending colon, junction with the sigmoid. No free air, abscess, or bowel obstruction. Recommend up to date colon cancer screening following resolution of the acute process as carcinoma can sometimes present in a similar fashion. 2. No other acute or inflammatory process identified in the noncontrast abdomen or pelvis. Mild nephrolithiasis.  Aortic  Atherosclerosis (ICD10-I70.0). Electronically Signed   By: Genevie Ann M.D.   On: 12/22/2021 05:51     Assessment & Plan by Problem: Principal Problem:   Diverticulitis  Crystal Bullock is a 56 y.o. female  has a past medical history of Anxiety (10/06/2015), Asthma, Atypical chest pain (10/06/2015), Chronic neck pain, Fibroid, Hypertension, Palpitations (10/06/2015), RBBB (10/06/2015), Sarcoidosis, Scoliosis, Seasonal allergies, Sickle cell trait ([place]), and Vitamin D deficiency (05/2019) as well as diverticulitis in 2017 who presents for LLQ pain found to have acute uncomplicated diverticulitis.    #Acute uncomplicated diverticulitis Had diverticulitis in 2017. Reportedly has had 3 colonoscopies since that time with [facility] (Dr. B). Patient required admission due to requiring IV pain medicine for pain relief and inability to tolerate PO. Her CT of the AP showed no evidence of abscess, perf/microperforation. She was given  IV fluids. Started on pain control with IV HM, norco, and IV toradol.  -Continue IV antibiotics transition to PO once able to eat  -IV fluids -ADAT -Pain control with above regimen  #elevated Scr Possibly due to hypovolemia due to poor oral intake. IV fluids.  -F/u BMP in the AM   #Microcytic anemia  Mild, has history of sickle cell trait. Will follow up iron panel as well.   #Asthma #Mild eosinophilia #sarcoidosis  Continue home albuterol and zyrtec  #Anxiety  Not on atarax at home    Dispo: Admit patient to Inpatient with expected length of stay greater than 2 midnights.  Signed: Rick Duff, MD 12/22/2021, 1:47 PM  After 5pm on weekdays and 1pm on weekends: On Call pager: [phone]

## 2021-12-23 DIAGNOSIS — K5732 Diverticulitis of large intestine without perforation or abscess without bleeding: Secondary | ICD-10-CM | POA: Diagnosis not present

## 2021-12-23 LAB — IRON AND TIBC
Iron: 33 ug/dL (ref 28–170)
Saturation Ratios: 14 % (ref 10.4–31.8)
TIBC: 244 ug/dL — ABNORMAL LOW (ref 250–450)
UIBC: 211 ug/dL

## 2021-12-23 LAB — CBC
HCT: 28.7 % — ABNORMAL LOW (ref 36.0–46.0)
Hemoglobin: 10 g/dL — ABNORMAL LOW (ref 12.0–15.0)
MCH: 25.5 pg — ABNORMAL LOW (ref 26.0–34.0)
MCHC: 34.8 g/dL (ref 30.0–36.0)
MCV: 73.2 fL — ABNORMAL LOW (ref 80.0–100.0)
Platelets: 216 10*3/uL (ref 150–400)
RBC: 3.92 MIL/uL (ref 3.87–5.11)
RDW: 14 % (ref 11.5–15.5)
WBC: 9.4 10*3/uL (ref 4.0–10.5)
nRBC: 0 % (ref 0.0–0.2)

## 2021-12-23 LAB — COMPREHENSIVE METABOLIC PANEL
ALT: 16 U/L (ref 0–44)
AST: 18 U/L (ref 15–41)
Albumin: 2.7 g/dL — ABNORMAL LOW (ref 3.5–5.0)
Alkaline Phosphatase: 44 U/L (ref 38–126)
Anion gap: 7 (ref 5–15)
BUN: 9 mg/dL (ref 6–20)
CO2: 30 mmol/L (ref 22–32)
Calcium: 8.5 mg/dL — ABNORMAL LOW (ref 8.9–10.3)
Chloride: 100 mmol/L (ref 98–111)
Creatinine, Ser: 1.12 mg/dL — ABNORMAL HIGH (ref 0.44–1.00)
GFR, Estimated: 58 mL/min — ABNORMAL LOW (ref >60)
Glucose, Bld: 188 mg/dL — ABNORMAL HIGH (ref 70–99)
Potassium: 3 mmol/L — ABNORMAL LOW (ref 3.5–5.1)
Sodium: 137 mmol/L (ref 135–145)
Total Bilirubin: 0.3 mg/dL (ref 0.3–1.2)
Total Protein: 6 g/dL — ABNORMAL LOW (ref 6.5–8.1)

## 2021-12-23 LAB — VITAMIN D 25 HYDROXY (VIT D DEFICIENCY, FRACTURES): Vit D, 25-Hydroxy: 57.43 ng/mL (ref 30–100)

## 2021-12-23 LAB — FERRITIN: Ferritin: 74 ng/mL (ref 11–307)

## 2021-12-23 LAB — MAGNESIUM: Magnesium: 2 mg/dL (ref 1.7–2.4)

## 2021-12-23 MED ORDER — METRONIDAZOLE 500 MG PO TABS: 500.0000 mg | ORAL_TABLET | Freq: Three times a day (TID) | ORAL | 0 refills | Status: AC

## 2021-12-23 MED ORDER — POTASSIUM CHLORIDE CRYS ER 20 MEQ PO TBCR
40.0000 meq | EXTENDED_RELEASE_TABLET | Freq: Once | ORAL | Status: AC
  Administered 2021-12-23: 40 meq via ORAL
  Filled 2021-12-23: qty 2

## 2021-12-23 MED ORDER — LINACLOTIDE 72 MCG PO CAPS
72.0000 ug | ORAL_CAPSULE | Freq: Every day | ORAL | Status: DC
  Administered 2021-12-23: 72 ug via ORAL
  Filled 2021-12-23: qty 1

## 2021-12-23 MED ORDER — CIPROFLOXACIN HCL 500 MG PO TABS: 500.0000 mg | ORAL_TABLET | Freq: Two times a day (BID) | ORAL | 0 refills | Status: AC

## 2021-12-23 MED ORDER — LACTATED RINGERS IV SOLN: INTRAVENOUS | Status: DC

## 2021-12-23 MED ORDER — POLYETHYLENE GLYCOL 3350 17 G PO PACK
17.0000 g | PACK | Freq: Every day | ORAL | Status: DC
  Administered 2021-12-23: 17 g via ORAL
  Filled 2021-12-23: qty 1

## 2021-12-23 NOTE — Progress Notes (Signed)
Patient discharged to home with family via wheelchair with all belongings.

## 2021-12-23 NOTE — Progress Notes (Signed)
Discharge instructions given to pt. Pt verbalized understanding of all teaching and had no further questions. Patient currently waiting in room for her ride to come pick her up

## 2021-12-23 NOTE — Care Management CC44 (Signed)
Condition Code 44 Documentation Completed  Patient Details  Name: Arion Morgan MRN: [id number] Date of Birth: 1965/03/02   Condition Code 44 given:  Yes Patient signature on Condition Code 44 notice:  Yes Documentation of 2 MD's agreement:  Yes Code 44 added to claim:  Yes    Bartholomew Crews, RN 12/23/2021, 1:54 PM

## 2021-12-23 NOTE — Care Management Obs Status (Signed)
[facility] NOTIFICATION   Patient Details  Name: Crystal Bullock MRN: [id number] Date of Birth: 10-21-1965   Medicare Observation Status Notification Given:  Yes    Bartholomew Crews, RN 12/23/2021, 1:54 PM

## 2021-12-23 NOTE — Discharge Summary (Addendum)
Name: Crystal Bullock MRN: [id number] DOB: September 27, 1965 56 y.o. PCP: Fenton Foy, NP  Date of Admission: 12/22/2021  2:10 AM Date of Discharge:  12/23/2021 Attending Physician: Dr. Jimmye Norman  DISCHARGE DIAGNOSIS:  Primary Problem: Diverticulitis   Hospital Problems: Acute, uncomplicated recurrent diverticulitis of the descending colon AKI, resolved Microcytic anemia   DISCHARGE MEDICATIONS:   Allergies as of 12/23/2021       Reactions   Bee Venom Anaphylaxis   Iodinated Contrast Media Rash   Hives on hands and feet per pt 08/16/15 pt had scan w/ contrast and had a 4 hr premedication protocol done        Medication List     TAKE these medications    albuterol (2.5 MG/3ML) 0.083% nebulizer solution Commonly known as: PROVENTIL USE 1 VIAL VIA NEBULIZER EVERY 6 HOURS AS NEEDED FOR WHEEZING OR SHORTNESS OF BREATH What changed:  how much to take how to take this when to take this reasons to take this additional instructions   albuterol 108 (90 Base) MCG/ACT inhaler Commonly known as: VENTOLIN HFA INHALE 2 PUFFS INTO THE LUNGS EVERY 6 HOURS FOR UP TO 90 DOSES AS NEEDED FOR WHEEZING What changed:  how much to take how to take this when to take this reasons to take this additional instructions   amLODipine 10 MG tablet Commonly known as: NORVASC TAKE 1 TABLET(10 MG) BY MOUTH DAILY What changed:  how much to take how to take this when to take this additional instructions   ascorbic acid 500 MG tablet Commonly known as: VITAMIN C Take 500 mg by mouth daily.   baclofen 10 MG tablet Commonly known as: LIORESAL Take 10 mg by mouth 2 (two) times daily as needed for muscle spasms.   busPIRone 7.5 MG tablet Commonly known as: BUSPAR Take 1 tablet (7.5 mg total) by mouth 3 (three) times daily. What changed:  when to take this reasons to take this   cetirizine 10 MG tablet Commonly known as: ZYRTEC TAKE 1 TABLET(10 MG) BY MOUTH DAILY AS NEEDED FOR  ALLERGIES What changed:  how much to take how to take this when to take this reasons to take this   ciprofloxacin 500 MG tablet Commonly known as: Cipro Take 1 tablet (500 mg total) by mouth 2 (two) times daily for 9 days.   clindamycin 1 % gel Commonly known as: Clindagel Apply topically 2 (two) times daily.   cyclobenzaprine 10 MG tablet Commonly known as: FLEXERIL Take 1 tablet (10 mg total) by mouth 3 (three) times daily as needed for muscle spasms.   esomeprazole 40 MG capsule Commonly known as: NEXIUM Take 1 capsule (40 mg total) by mouth daily.   fluticasone 50 MCG/ACT nasal spray Commonly known as: FLONASE Place 2 sprays into both nostrils daily as needed. allergies   hydrOXYzine 50 MG capsule Commonly known as: VISTARIL Take as prescribed as AVS.   ibuprofen 600 MG tablet Commonly known as: ADVIL Take 1 tablet (600 mg total) by mouth every 6 (six) hours as needed for moderate pain.   Linzess 72 MCG capsule Generic drug: linaclotide Take 1 capsule (72 mcg total) by mouth daily.   methocarbamol 500 MG tablet Commonly known as: ROBAXIN Take 1 tablet (500 mg total) by mouth every 6 (six) hours as needed for muscle spasms.   metroNIDAZOLE 500 MG tablet Commonly known as: Flagyl Take 1 tablet (500 mg total) by mouth 3 (three) times daily for 9 days.   multivitamins ther. w/minerals  Tabs tablet Take 1 tablet by mouth daily.   NON FORMULARY CPAP at bedtime   nystatin ointment Commonly known as: MYCOSTATIN Apply 1 application. topically 2 (two) times daily.   oxyCODONE-acetaminophen 10-325 MG tablet Commonly known as: PERCOCET Take 1 tablet by mouth every 8 (eight) hours as needed for pain.   sucralfate 1 g tablet Commonly known as: Carafate Take 1 tablet (1 g total) by mouth 4 (four) times daily as needed. What changed: reasons to take this   Vitamin D (Ergocalciferol) 1.25 MG (50000 UNIT) Caps capsule Commonly known as: DRISDOL Take 1 capsule  (50,000 Units total) by mouth every 7 (seven) days. What changed: when to take this     DISPOSITION AND FOLLOW-UP:  Ms.Princes Callan Yontz was discharged from [facility] in stable and improved condition. At the hospital follow up visit please address:  Acute, uncomplicated recurrent diverticulitis: Discharged with cipro/flagyl to complete 10 days of treatment. Has pain medications at home and is tolerating a diet. Recommend follow up with GI  AKI: f/u BMP as she resumes eating and drinking  Follow-up Recommendations: Consults: Gastroenterology Labs: bmp Studies: none New Medications:  Ciprofloxacin 500 mg bid x 9 days Flagyl 500 mg tid x 9 days  Follow-up Appointments:  Follow-up Information     Fenton Foy, NP. Schedule an appointment as soon as possible for a visit.   Specialties: Pulmonary Disease, Endocrinology Why: make an appointment for a hospital follow up Contact information: [place]. [address] [place] [address] [phone]         Gastroenterology, [facility] [place]. Call.   Why: make an appointment for a follow up of diverticulitis Contact information: [place] [address] [phone]                 HOSPITAL COURSE:  Patient Summary: Acute uncomplicated diverticulitis Had diverticulitis in 2017. Reportedly has had 3 colonoscopies since that time with [facility] (Dr. B). Patient required admission now due to requiring IV pain medicine for pain relief and inability to tolerate PO. Her CT of the AP showed no evidence of abscess, perf/microperforation. CT was positive for a 7 cm segment of moderate to severe acute diverticulitis in the distal descending colon, junction with the sigmoid. She was given IV fluids and was started on pain control with IV dilaudid norco, and IV toradol, however, after a couple doses of dilaudid her pain was well controlled and she did not require any further pain medications. She  was started on IV zosyn and will be discharged with ciprofloxacin and flagyl to complete a 10 day course of antibiotics. Recommend she follow up with [facility] at discharge.  AKI Serum creatinine was elevated very slightly on admission. Patient was given IV fluids and her PO intake improved with pain control as noted above.  Microcytic anemia Hb stable though she has had a gradual decrease in the past few months.  No GIB.  The patient has a hx of sickle cell trait. Ferritin was normal at 74 (though in setting of acute infection; it is probably lower in actuality) , iron normal at 33, TIBC decreased and iron sat normal at 14%.  Her albumin was also noted to be low.  Please f/u.  DISCHARGE INSTRUCTIONS:   Discharge Instructions     Call MD for:  persistant nausea and vomiting   Complete by: As directed    Call MD for:  severe uncontrolled pain   Complete  by: As directed    Call MD for:  temperature >100.4   Complete by: As directed    Diet - low sodium heart healthy   Complete by: As directed    Discharge instructions   Complete by: As directed    Dear Ms. Hedwig Morton were hospitalized because you had another episode of diverticulitis. We started you on antibiotics while you were in the hospital and you will need to take them for 9 more days to make sure you get a total of 10 days worth of treatment.  You should take flagyl 500 mg three times a day and ciprofloxacin 500 mg twice a day - both of these will be taken for 9 more days. You should use the percocet you have at home when your pain is severe, but otherwise you can take things like tylenol and ibuprofen/motrin - just be sure to not take these on any empty stomach!  I recommend you call your GI doctor to set up a follow up appointment for your diverticulitis. The phone number for them is: [phone]. You should also see your primary care physician in the next week or two for a hospital follow up.   We are so glad you are feeling  better! Take care!   Increase activity slowly   Complete by: As directed        SUBJECTIVE:  The patient was seen and evaluated on the day of discharge. Her abdominal pain is significantly improved compared to yesterday, but she does still have some discomfort. She is eating well and having no n/v/d.   Discharge Vitals:   BP 117/73 (BP Location: Right Arm)   Pulse 78   Temp 98.2 F (36.8 C) (Oral)   Resp 18   Ht '5\' 8"'$  (1.727 m)   Wt 112.7 kg   LMP 08/24/2015 Comment: irregular periods per pt 09/12/15  SpO2 93%   BMI 37.78 kg/m   OBJECTIVE:  General: Pleasant, well-appearing female laying in bed. No acute distress. CV: RRR. No murmur. No LE edema Pulmonary: Lungs CTAB. Normal effort. No wheezing or rales. Abdominal: Soft, nondistended. Normal bowel sounds. Mild tenderness to palpation in LLQ.  Skin: Warm and dry. Neuro: A&Ox3. Moves all extremities. No focal deficit. Psych: Normal mood and affect    Pertinent Labs, Studies, and Procedures:     Latest Ref Rng & Units 12/23/2021    1:47 AM 12/22/2021    3:35 AM 09/13/2021   10:27 AM  CBC  WBC 4.0 - 10.5 K/uL 9.4  13.0  9.0   Hemoglobin 12.0 - 15.0 g/dL 10.0  11.7  12.9   Hematocrit 36.0 - 46.0 % 28.7  33.3  37.2   Platelets 150 - 400 K/uL 216  279  257        Latest Ref Rng & Units 12/23/2021    1:47 AM 12/22/2021    3:35 AM 09/13/2021   10:27 AM  CMP  Glucose 70 - 99 mg/dL 188  144  127   BUN 6 - 20 mg/dL '9  11  13   '$ Creatinine 0.44 - 1.00 mg/dL 1.12  1.03  0.88   Sodium 135 - 145 mmol/L 137  138  136   Potassium 3.5 - 5.1 mmol/L 3.0  3.3  3.3   Chloride 98 - 111 mmol/L 100  100  103   CO2 22 - 32 mmol/L '30  28  27   '$ Calcium 8.9 - 10.3 mg/dL 8.5  9.3  9.1  Total Protein 6.5 - 8.1 g/dL 6.0  7.2    Total Bilirubin 0.3 - 1.2 mg/dL 0.3  0.5    Alkaline Phos 38 - 126 U/L 44  50    AST 15 - 41 U/L 18  21    ALT 0 - 44 U/L 16  20      CT ABDOMEN PELVIS WO CONTRAST  Result Date: 12/22/2021 CLINICAL DATA:   56 year old female with lower abdominal pain since 1700 hours. Nausea. EXAM: CT ABDOMEN AND PELVIS WITHOUT CONTRAST TECHNIQUE: Multidetector CT imaging of the abdomen and pelvis was performed following the standard protocol without IV contrast. RADIATION DOSE REDUCTION: This exam was performed according to the departmental dose-optimization program which includes automated exposure control, adjustment of the mA and/or kV according to patient size and/or use of iterative reconstruction technique. COMPARISON:  Noncontrast CT Abdomen and Pelvis 09/10/2019 and earlier. FINDINGS: Lower chest: Cardiac size is stable and within normal limits. No pericardial or pleural effusion. Lower lung volumes with mild lung base atelectasis. Hepatobiliary: Stable noncontrast liver.  Chronic cholecystectomy. Pancreas: Negative noncontrast pancreas. Spleen: Negative. Adrenals/Urinary Tract: Negative adrenal glands. Mild nephrolithiasis. Chronic right lower pole renal cyst does not appear significantly changed since 2017 and benign (no follow-up imaging recommended). No hydronephrosis. Decompressed ureters. Chronic right hemipelvis phlebolith. Negative bladder. Stomach/Bowel: Negative rectum. Diverticulosis throughout the sigmoid colon and descending colon, from the splenic flexure to the distal sigmoid. Beginning on series 3, image 57 a 6-7 cm segment of thickened and inflamed large bowel at the junction of the descending and sigmoid colon is demonstrated (coronal image 76) with numerous diverticula in the region. Moderate to severe associated bowel wall thickening, with loss of the bowel lumen. No extraluminal gas identified. Trace fluid layering in the left gutter. No dilated upstream large bowel. Transverse and right colon are negative, with normal appendix arising on series 3, image 70. Decompressed terminal ileum. No dilated small bowel. Decompressed stomach and duodenum. No free air or free fluid. Vascular/Lymphatic: Normal caliber  abdominal aorta. Calcified aortic atherosclerosis. No lymphadenopathy identified. Reproductive: Stable noncontrast appearance, bilateral tubal ligation clips. Other: No pelvis free fluid. Musculoskeletal: Chronic degenerative retrolisthesis of L4 on L5 with progressed disc degeneration and new vacuum disc since 2021. No acute osseous abnormality identified. IMPRESSION: 1. Positive for a 7 cm segment of moderate to severe Acute Diverticulitis in the distal descending colon, junction with the sigmoid. No free air, abscess, or bowel obstruction. Recommend up to date colon cancer screening following resolution of the acute process as carcinoma can sometimes present in a similar fashion. 2. No other acute or inflammatory process identified in the noncontrast abdomen or pelvis. Mild nephrolithiasis.  Aortic Atherosclerosis (ICD10-I70.0). Electronically Signed   By: Genevie Ann M.D.   On: 12/22/2021 05:51     Signed: Buddy Duty, D.O.  Internal Medicine Resident, PGY-2 [facility]  Pager: [phone] 1:29 PM, 12/23/2021

## 2021-12-24 ENCOUNTER — Other Ambulatory Visit: Payer: Self-pay

## 2021-12-24 MED ORDER — THERA M PLUS PO TABS: 1.0000 | ORAL_TABLET | Freq: Every day | ORAL | 0 refills | Status: DC

## 2021-12-24 MED ORDER — VITAMIN C 500 MG PO TABS
500.0000 mg | ORAL_TABLET | Freq: Every day | ORAL | 0 refills | Status: DC
  Filled 2021-12-24 – 2021-12-26 (×2): qty 100, 100d supply, fill #0

## 2021-12-24 NOTE — Telephone Encounter (Signed)
From: [name] To: Office of Fenton Foy, NP Sent: 12/21/2021 8:11 PM EST Subject: Medication Renewal Request  Refills have been requested for the following medications:   vitamin C (ASCORBIC ACID) 500 MG tablet  Preferred pharmacy: [facility] Delivery method: Arlyss Gandy

## 2021-12-24 NOTE — Telephone Encounter (Signed)
From: [name] To: Office of Fenton Foy, NP Sent: 12/21/2021 8:10 PM EST Subject: Medication Renewal Request  Refills have been requested for the following medications:   Multiple Vitamins-Minerals (MULTIVITAMINS THER. W/MINERALS) TABS tablet Kriste Basque Nichols]  Preferred pharmacy: [facility] Delivery method: Arlyss Gandy

## 2021-12-26 ENCOUNTER — Other Ambulatory Visit: Payer: Self-pay

## 2021-12-27 ENCOUNTER — Other Ambulatory Visit: Payer: Self-pay

## 2022-01-16 ENCOUNTER — Telehealth: Payer: Self-pay | Admitting: Nurse Practitioner

## 2022-01-16 NOTE — Telephone Encounter (Signed)
Patient was told at discharge to follow with GI. Has she scheduled an appointment with them yet? If she needs an appointment for follow up for [place] to place that referral then please schedule with me or Fola. Thanks.

## 2022-01-16 NOTE — Telephone Encounter (Signed)
Caller & Relationship to patient:  patient MRN #  [id number]   Call Back Number:   Date of Last Office Visit: 12/24/2021     Date of Next Office Visit: 03/29/2022    Medication(s) to be Refilled: argamentin    Preferred Pharmacy:   ** Please notify patient to allow 48-72 hours to process** **Let patient know to contact pharmacy at the end of the day to make sure medication is ready. ** **If patient has not been seen in a year or longer, book an appointment **Advise to use MyChart for refill requests OR to contact their pharmacy

## 2022-01-17 ENCOUNTER — Other Ambulatory Visit: Payer: Self-pay | Admitting: Nurse Practitioner

## 2022-01-17 MED ORDER — AMOXICILLIN-POT CLAVULANATE 875-125 MG PO TABS: 1.0000 | ORAL_TABLET | Freq: Two times a day (BID) | ORAL | 0 refills | Status: DC

## 2022-01-17 NOTE — Telephone Encounter (Signed)
Please call to clarify what the refill request is for. Thanks.

## 2022-01-23 ENCOUNTER — Other Ambulatory Visit: Payer: Self-pay | Admitting: Nurse Practitioner

## 2022-01-23 DIAGNOSIS — D869 Sarcoidosis, unspecified: Secondary | ICD-10-CM

## 2022-01-23 DIAGNOSIS — J4520 Mild intermittent asthma, uncomplicated: Secondary | ICD-10-CM

## 2022-02-14 ENCOUNTER — Other Ambulatory Visit (HOSPITAL_COMMUNITY): Payer: Self-pay

## 2022-02-14 MED ORDER — OZEMPIC (0.25 OR 0.5 MG/DOSE) 2 MG/3ML ~~LOC~~ SOPN
0.2500 mg | PEN_INJECTOR | SUBCUTANEOUS | 1 refills | Status: DC
  Filled 2022-02-14: qty 3, 42d supply, fill #0
  Filled 2022-03-29: qty 3, 28d supply, fill #1

## 2022-03-04 ENCOUNTER — Encounter: Payer: Self-pay | Admitting: Nurse Practitioner

## 2022-03-04 ENCOUNTER — Ambulatory Visit (INDEPENDENT_AMBULATORY_CARE_PROVIDER_SITE_OTHER): Payer: 59 | Admitting: Nurse Practitioner

## 2022-03-04 VITALS — BP 128/78 | HR 80 | Ht 67.75 in | Wt 244.0 lb

## 2022-03-04 DIAGNOSIS — M5416 Radiculopathy, lumbar region: Secondary | ICD-10-CM

## 2022-03-04 DIAGNOSIS — D869 Sarcoidosis, unspecified: Secondary | ICD-10-CM

## 2022-03-04 DIAGNOSIS — E559 Vitamin D deficiency, unspecified: Secondary | ICD-10-CM

## 2022-03-04 DIAGNOSIS — I10 Essential (primary) hypertension: Secondary | ICD-10-CM | POA: Diagnosis not present

## 2022-03-04 DIAGNOSIS — Z13220 Encounter for screening for lipoid disorders: Secondary | ICD-10-CM

## 2022-03-04 MED ORDER — ONDANSETRON HCL 4 MG PO TABS: 4.0000 mg | ORAL_TABLET | Freq: Three times a day (TID) | ORAL | 0 refills | Status: DC | PRN

## 2022-03-04 MED ORDER — AMOXICILLIN-POT CLAVULANATE 875-125 MG PO TABS: 1.0000 | ORAL_TABLET | Freq: Two times a day (BID) | ORAL | 0 refills | Status: DC

## 2022-03-04 MED ORDER — VITAMIN D (ERGOCALCIFEROL) 1.25 MG (50000 UNIT) PO CAPS: 50000.0000 [IU] | ORAL_CAPSULE | ORAL | 6 refills | Status: DC

## 2022-03-04 NOTE — Patient Instructions (Signed)
1. Vitamin D deficiency  - Vitamin D, Ergocalciferol, (DRISDOL) 1.25 MG (50000 UNIT) CAPS capsule; Take 1 capsule (50,000 Units total) by mouth every 7 (seven) days.  Dispense: 5 capsule; Refill: 6  2. Lumbar radiculopathy  - For home use only DME Hospital bed  3. Sarcoidosis  - For home use only DME Hospital bed  4. Essential hypertension  - CBC - Comprehensive metabolic panel  5. Lipid screening  - Lipid Panel  Follow up:  Follow up in 6 months

## 2022-03-04 NOTE — Assessment & Plan Note (Signed)
-  Vitamin D, Ergocalciferol, (DRISDOL) 1.25 MG (50000 UNIT) CAPS capsule; Take 1 capsule (50,000 Units total) by mouth every 7 (seven) days.  Dispense: 5 capsule; Refill: 6  2. Lumbar radiculopathy  - For home use only DME Hospital bed  3. Sarcoidosis  - For home use only DME Hospital bed  4. Essential hypertension  - CBC - Comprehensive metabolic panel  5. Lipid screening  - Lipid Panel  Follow up:  Follow up in 6 months

## 2022-03-04 NOTE — Progress Notes (Signed)
$'@Patient'g$  ID: Crystal Bullock, female    DOB: 1965/05/15, 57 y.o.   MRN: [id number]  Chief Complaint  Patient presents with   Medication Refill    Need refills and needs an order for hospital bed. She is fasting for labs.     Referring provider: Fenton Foy, NP   HPI  Crystal Bullock 57 y.o. female  has a past medical history of Anxiety (10/06/2015), Asthma, Atypical chest pain (10/06/2015), Chronic neck pain, Fibroid, Hypertension, Palpitations (10/06/2015), RBBB (10/06/2015), Sarcoidosis, Scoliosis, Seasonal allergies, Sickle cell trait ([place]), and Vitamin D deficiency (05/2019).     Patient presents for follow-up visit today.  Overall she has been doing well.  Blood pressure stable in office today.  Patient is compliant with meds.  Patient is requesting hospital bed - states that she has to reposition frequently.  Patient does have sarcoidosis and lumbar radiculopathy.  Patient does have to sleep with the head of the bed raised at night. Denies f/c/s, n/v/d, hemoptysis, PND, leg swelling Denies chest pain or edema     Allergies  Allergen Reactions   Bee Venom Anaphylaxis   Iodinated Contrast Media Rash    Hives on hands and feet per pt 08/16/15 pt had scan w/ contrast and had a 4 hr premedication protocol done     There is no immunization history on file for this patient.  Past Medical History:  Diagnosis Date   Anxiety 10/06/2015   Asthma    Atypical chest pain 10/06/2015   Chronic neck pain    Fibroid    Hypertension    Palpitations 10/06/2015   RBBB 10/06/2015   Sarcoidosis    Scoliosis    Seasonal allergies    Sickle cell trait ([place])    Vitamin D deficiency 05/2019    Tobacco History: Social History   Tobacco Use  Smoking Status Never  Smokeless Tobacco Never   Counseling given: Not Answered   Outpatient Encounter Medications as of 03/04/2022  Medication Sig   albuterol (PROVENTIL) (2.5 MG/3ML) 0.083% nebulizer solution USE 1 VIAL VIA  NEBULIZER EVERY 6 HOURS AS NEEDED FOR WHEEZING OR SHORTNESS OF BREATH (Patient taking differently: Take 2.5 mg by nebulization every 6 (six) hours as needed for wheezing or shortness of breath.)   albuterol (VENTOLIN HFA) 108 (90 Base) MCG/ACT inhaler INHALE 2 PUFFS INTO THE LUNGS EVERY 6 HOURS FOR UP TO 90 DOSES AS NEEDED FOR WHEEZING (Patient taking differently: Inhale 2 puffs into the lungs every 6 (six) hours as needed for shortness of breath.)   amLODipine (NORVASC) 10 MG tablet TAKE 1 TABLET(10 MG) BY MOUTH DAILY (Patient taking differently: Take 10 mg by mouth daily.)   ascorbic acid (VITAMIN C) 500 MG tablet Take 1 tablet (500 mg total) by mouth daily.   baclofen (LIORESAL) 10 MG tablet Take 10 mg by mouth 2 (two) times daily as needed for muscle spasms.   busPIRone (BUSPAR) 7.5 MG tablet Take 1 tablet (7.5 mg total) by mouth 3 (three) times daily. (Patient taking differently: Take 7.5 mg by mouth 2 (two) times daily as needed (anxiety).)   cetirizine (ZYRTEC) 10 MG tablet TAKE 1 TABLET(10 MG) BY MOUTH DAILY AS NEEDED FOR ALLERGIES (Patient taking differently: Take 10 mg by mouth daily as needed for allergies. TAKE 1 TABLET(10 MG) BY MOUTH DAILY AS NEEDED FOR ALLERGIES)   clindamycin (CLINDAGEL) 1 % gel Apply topically 2 (two) times daily.   cyclobenzaprine (FLEXERIL) 10 MG tablet Take 1 tablet (10 mg total)  by mouth 3 (three) times daily as needed for muscle spasms.   esomeprazole (NEXIUM) 40 MG capsule Take 1 capsule (40 mg total) by mouth daily.   fluticasone (FLONASE) 50 MCG/ACT nasal spray Place 2 sprays into both nostrils daily as needed. allergies   hydrOXYzine (VISTARIL) 50 MG capsule Take as prescribed as AVS.   LINZESS 72 MCG capsule Take 1 capsule (72 mcg total) by mouth daily.   methocarbamol (ROBAXIN) 500 MG tablet Take 1 tablet (500 mg total) by mouth every 6 (six) hours as needed for muscle spasms.   Multiple Vitamins-Minerals (MULTIVITAMINS THER. W/MINERALS) TABS tablet Take 1  tablet by mouth daily.   NON FORMULARY CPAP at bedtime   nystatin ointment (MYCOSTATIN) Apply 1 application. topically 2 (two) times daily.   ondansetron (ZOFRAN) 4 MG tablet Take 1 tablet (4 mg total) by mouth every 8 (eight) hours as needed for nausea or vomiting.   oxyCODONE-acetaminophen (PERCOCET) 10-325 MG tablet Take 1 tablet by mouth every 8 (eight) hours as needed for pain.   promethazine (PHENERGAN) 25 MG suppository Place 25 mg rectally every 6 (six) hours as needed for nausea or vomiting.   Semaglutide,0.25 or 0.'5MG'$ /DOS, (OZEMPIC, 0.25 OR 0.5 MG/DOSE,) 2 MG/3ML SOPN Inject 0.25 mg into the skin once a week.   [DISCONTINUED] amoxicillin-clavulanate (AUGMENTIN) 875-125 MG tablet Take 1 tablet by mouth 2 (two) times daily.   [DISCONTINUED] Vitamin D, Ergocalciferol, (DRISDOL) 1.25 MG (50000 UNIT) CAPS capsule Take 1 capsule (50,000 Units total) by mouth every 7 (seven) days. (Patient taking differently: Take 50,000 Units by mouth every Wednesday.)   amoxicillin-clavulanate (AUGMENTIN) 875-125 MG tablet Take 1 tablet by mouth 2 (two) times daily.   sucralfate (CARAFATE) 1 g tablet Take 1 tablet (1 g total) by mouth 4 (four) times daily as needed. (Patient not taking: Reported on 03/04/2022)   Vitamin D, Ergocalciferol, (DRISDOL) 1.25 MG (50000 UNIT) CAPS capsule Take 1 capsule (50,000 Units total) by mouth every 7 (seven) days.   [DISCONTINUED] ibuprofen (ADVIL) 600 MG tablet Take 1 tablet (600 mg total) by mouth every 6 (six) hours as needed for moderate pain.   No facility-administered encounter medications on file as of 03/04/2022.     Review of Systems  Review of Systems  Constitutional: Negative.   HENT: Negative.    Cardiovascular: Negative.   Gastrointestinal: Negative.   Allergic/Immunologic: Negative.   Neurological: Negative.   Psychiatric/Behavioral: Negative.         Physical Exam  BP 128/78   Pulse 80   Ht 5' 7.75" (1.721 m)   Wt 244 lb (110.7 kg)   LMP  08/24/2015 Comment: irregular periods per pt 09/12/15  SpO2 98%   BMI 37.37 kg/m   Wt Readings from Last 5 Encounters:  03/04/22 244 lb (110.7 kg)  12/22/21 248 lb 7.3 oz (112.7 kg)  09/26/21 245 lb 12.8 oz (111.5 kg)  09/13/21 242 lb (109.8 kg)  07/20/21 252 lb (114.3 kg)     Physical Exam Vitals and nursing note reviewed.  Constitutional:      General: She is not in acute distress.    Appearance: She is well-developed.  Cardiovascular:     Rate and Rhythm: Normal rate and regular rhythm.  Pulmonary:     Effort: Pulmonary effort is normal.     Breath sounds: Normal breath sounds.  Neurological:     Mental Status: She is alert and oriented to person, place, and time.      Lab Results:  CBC  Component Value Date/Time   WBC 9.4 12/23/2021 0147   RBC 3.92 12/23/2021 0147   HGB 10.0 (L) 12/23/2021 0147   HGB 13.4 12/15/2020 1055   HCT 28.7 (L) 12/23/2021 0147   HCT 40.4 12/15/2020 1055   PLT 216 12/23/2021 0147   PLT 239 12/15/2020 1055   MCV 73.2 (L) 12/23/2021 0147   MCV 77 (L) 12/15/2020 1055   MCH 25.5 (L) 12/23/2021 0147   MCHC 34.8 12/23/2021 0147   RDW 14.0 12/23/2021 0147   RDW 15.3 12/15/2020 1055   LYMPHSABS 2.3 12/22/2021 0335   LYMPHSABS 2.0 12/15/2020 1055   MONOABS 0.8 12/22/2021 0335   EOSABS 0.6 (H) 12/22/2021 0335   EOSABS 0.1 12/15/2020 1055   BASOSABS 0.1 12/22/2021 0335   BASOSABS 0.1 12/15/2020 1055    BMET    Component Value Date/Time   NA 137 12/23/2021 0147   NA 144 03/19/2021 1124   K 3.0 (L) 12/23/2021 0147   CL 100 12/23/2021 0147   CO2 30 12/23/2021 0147   GLUCOSE 188 (H) 12/23/2021 0147   BUN 9 12/23/2021 0147   BUN 14 03/19/2021 1124   CREATININE 1.12 (H) 12/23/2021 0147   CALCIUM 8.5 (L) 12/23/2021 0147   GFRNONAA 58 (L) 12/23/2021 0147   GFRAA >60 09/10/2019 1534    BNP No results found for: "BNP"  ProBNP    Component Value Date/Time   PROBNP 32.0 05/12/2009 1140    Imaging: No results  found.   Assessment & Plan:   Vitamin D deficiency - Vitamin D, Ergocalciferol, (DRISDOL) 1.25 MG (50000 UNIT) CAPS capsule; Take 1 capsule (50,000 Units total) by mouth every 7 (seven) days.  Dispense: 5 capsule; Refill: 6  2. Lumbar radiculopathy  - For home use only DME Hospital bed  3. Sarcoidosis  - For home use only DME Hospital bed  4. Essential hypertension  - CBC - Comprehensive metabolic panel  5. Lipid screening  - Lipid Panel  Follow up:  Follow up in 6 months     Fenton Foy, NP 03/04/2022

## 2022-03-05 LAB — LIPID PANEL
Chol/HDL Ratio: 3.6 ratio (ref 0.0–4.4)
Cholesterol, Total: 134 mg/dL (ref 100–199)
HDL: 37 mg/dL — ABNORMAL LOW (ref >39)
LDL Chol Calc (NIH): 70 mg/dL (ref 0–99)
Triglycerides: 154 mg/dL — ABNORMAL HIGH (ref 0–149)
VLDL Cholesterol Cal: 27 mg/dL (ref 5–40)

## 2022-03-05 LAB — COMPREHENSIVE METABOLIC PANEL
ALT: 17 IU/L (ref 0–32)
AST: 19 IU/L (ref 0–40)
Albumin/Globulin Ratio: 1.2 (ref 1.2–2.2)
Albumin: 4.1 g/dL (ref 3.8–4.9)
Alkaline Phosphatase: 62 IU/L (ref 44–121)
BUN/Creatinine Ratio: 10 (ref 9–23)
BUN: 12 mg/dL (ref 6–24)
Bilirubin Total: <0.2 mg/dL (ref 0.0–1.2)
CO2: 23 mmol/L (ref 20–29)
Calcium: 9.8 mg/dL (ref 8.7–10.2)
Chloride: 103 mmol/L (ref 96–106)
Creatinine, Ser: 1.17 mg/dL — ABNORMAL HIGH (ref 0.57–1.00)
Globulin, Total: 3.3 g/dL (ref 1.5–4.5)
Glucose: 87 mg/dL (ref 70–99)
Potassium: 4.1 mmol/L (ref 3.5–5.2)
Sodium: 141 mmol/L (ref 134–144)
Total Protein: 7.4 g/dL (ref 6.0–8.5)
eGFR: 55 mL/min/{1.73_m2} — ABNORMAL LOW (ref >59)

## 2022-03-05 LAB — CBC
Hematocrit: 37.1 % (ref 34.0–46.6)
Hemoglobin: 12.4 g/dL (ref 11.1–15.9)
MCH: 25.2 pg — ABNORMAL LOW (ref 26.6–33.0)
MCHC: 33.4 g/dL (ref 31.5–35.7)
MCV: 75 fL — ABNORMAL LOW (ref 79–97)
Platelets: 317 10*3/uL (ref 150–450)
RBC: 4.93 x10E6/uL (ref 3.77–5.28)
RDW: 14.8 % (ref 11.7–15.4)
WBC: 10.3 10*3/uL (ref 3.4–10.8)

## 2022-03-29 ENCOUNTER — Ambulatory Visit: Payer: Self-pay | Admitting: Nurse Practitioner

## 2022-03-29 ENCOUNTER — Telehealth: Payer: Self-pay

## 2022-03-29 ENCOUNTER — Other Ambulatory Visit (HOSPITAL_COMMUNITY): Payer: Self-pay

## 2022-04-02 NOTE — Telephone Encounter (Signed)
Pt said she is waiting adjust mattress to be sento to her

## 2022-04-08 ENCOUNTER — Other Ambulatory Visit: Payer: Self-pay | Admitting: Nurse Practitioner

## 2022-04-08 ENCOUNTER — Telehealth (INDEPENDENT_AMBULATORY_CARE_PROVIDER_SITE_OTHER): Payer: 59 | Admitting: Nurse Practitioner

## 2022-04-08 DIAGNOSIS — G4733 Obstructive sleep apnea (adult) (pediatric): Secondary | ICD-10-CM

## 2022-04-08 DIAGNOSIS — M545 Low back pain, unspecified: Secondary | ICD-10-CM | POA: Diagnosis not present

## 2022-04-08 DIAGNOSIS — Z1231 Encounter for screening mammogram for malignant neoplasm of breast: Secondary | ICD-10-CM

## 2022-04-08 NOTE — Progress Notes (Unsigned)
Virtual Visit via Video Note  I connected with Crystal Bullock on 04/08/22 at  3:20 PM EST by a video enabled telemedicine application and verified that I am speaking with the correct person using two identifiers.  Location: Patient: home Provider: office   I discussed the limitations of evaluation and management by telemedicine and the availability of in person appointments. The patient expressed understanding and agreed to proceed.   History of Present Illness:  Sleep apnea, right side sciatica.   Will be seeing ortho on wed  [facility]     Observations/Objective:     03/04/2022    1:08 PM 12/23/2021    7:25 AM 12/23/2021    4:32 AM  Vitals with BMI  Height 5' 7.75"    Weight 244 lbs    BMI 123456    Systolic 0000000 [id number]  Diastolic 78 73 58  Pulse 80 78 71      Assessment and Plan:       I discussed the assessment and treatment plan with the patient. The patient was provided an opportunity to ask questions and all were answered. The patient agreed with the plan and demonstrated an understanding of the instructions.   The patient was advised to call back or seek an in-person evaluation if the symptoms worsen or if the condition fails to improve as anticipated.  I provided 23 minutes of non-face-to-face time during this encounter.   Fenton Foy, NP

## 2022-04-10 ENCOUNTER — Encounter: Payer: Self-pay | Admitting: Nurse Practitioner

## 2022-04-10 NOTE — Therapy (Incomplete)
OUTPATIENT PHYSICAL THERAPY THORACOLUMBAR EVALUATION   Patient Name: Crystal Bullock MRN: [id number] DOB:1965-06-30, 57 y.o., female Today's Date: 04/10/2022  END OF SESSION:   Past Medical History:  Diagnosis Date   Anxiety 10/06/2015   Asthma    Atypical chest pain 10/06/2015   Chronic neck pain    Fibroid    Hypertension    Palpitations 10/06/2015   RBBB 10/06/2015   Sarcoidosis    Scoliosis    Seasonal allergies    Sickle cell trait ([place])    Vitamin D deficiency 05/2019   Past Surgical History:  Procedure Laterality Date   AXILLARY LYMPH NODE DISSECTION     CHOLECYSTECTOMY     Patient Active Problem List   Diagnosis Date Noted   Vitamin D deficiency 03/04/2022   Diverticulitis 12/22/2021   Lumbar radiculopathy 09/26/2021   Class 2 severe obesity with serious comorbidity and body mass index (BMI) of 37.0 to 37.9 in adult, unspecified obesity type ([place]) 03/19/2021   Sickle cell trait ([place]) 03/19/2021   Neck pain, chronic 06/09/2019   Tearfulness 02/22/2019   Prediabetes 02/22/2019   Hyperglycemia 02/22/2019   Hemoglobin A1c less than 7.0% 02/22/2019   Yeast infection 11/11/2018   GERD (gastroesophageal reflux disease) 08/19/2018   Mild tetrahydrocannabinol (THC) abuse 08/19/2018   Hidradenitis suppurativa 06/05/2018   Allergic rhinitis 05/08/2018   OSA (obstructive sleep apnea) 05/13/2017   Low back pain 02/24/2017   Sarcoidosis 10/06/2015   Essential hypertension 10/06/2015   Asthma 10/06/2015   Atypical chest pain 10/06/2015   Anxiety 10/06/2015   Palpitations 10/06/2015   RBBB 10/06/2015    PCP: Fenton Foy, NP  REFERRING PROVIDER: Jeanella Anton, NP   REFERRING DIAG: degenerative disc disease, lumbar (M51.36)  Rationale for Evaluation and Treatment: Rehabilitation  THERAPY DIAG:  No diagnosis found.  ONSET DATE: ***  SUBJECTIVE:                                                                                                                                                                                            SUBJECTIVE STATEMENT: ***  PERTINENT HISTORY:  Asthma, anxiety, HTN, arrhythmias, sickle cell, sarciodosis  PAIN:  Are you having pain: *** Location/description: *** Best-worst over past week: ***  Per eval -  - aggravating factors: *** - Easing factors: ***    PRECAUTIONS: {Therapy precautions:24002}  WEIGHT BEARING RESTRICTIONS: No  FALLS:  Has patient fallen in last 6 months? {fallsyesno:27318}  LIVING ENVIRONMENT: Lives with: {[facility] lives with:25569::"lives with their family"} Lives in: {Lives in:25570} Stairs: {opstairs:27293} Has following equipment at home: {Assistive devices:23999}  OCCUPATION: ***  PLOF: {PLOF:24004}  PATIENT  GOALS: ***  NEXT MD VISIT: ***  OBJECTIVE:   DIAGNOSTIC FINDINGS:  ***  PATIENT SURVEYS:  {rehab surveys:24030}  SCREENING FOR RED FLAGS: Red flag questioning reassuring  COGNITION: Overall cognitive status: Within functional limits for tasks assessed     SENSATION: {sensation:27233}  MUSCLE LENGTH: Hamstrings: Right *** deg; Left *** deg Thomas test: Right *** deg; Left *** deg  POSTURE: {posture:25561}  PALPATION: ***  LUMBAR ROM:   AROM eval  Flexion   Extension   Right lateral flexion   Left lateral flexion   Right rotation   Left rotation    (Blank rows = not tested)  LOWER EXTREMITY ROM:     {AROM/PROM:27142}  Right eval Left eval  Hip flexion    Hip extension    Hip internal rotation    Hip external rotation    Knee extension    Knee flexion    (Blank rows = not tested) (Key: WFL = within functional limits not formally assessed, * = concordant pain, s = stiffness/stretching sensation, NT = not tested)  Comments:    LOWER EXTREMITY MMT:    MMT Right eval Left eval  Hip flexion    Hip abduction (modified sitting)    Hip internal rotation    Hip external rotation    Knee flexion    Knee extension     Ankle dorsiflexion     (Blank rows = not tested) (Key: WFL = within functional limits not formally assessed, * = concordant pain, s = stiffness/stretching sensation, NT = not tested)  Comments:    LUMBAR SPECIAL TESTS:  {lumbar special test:25242}  FUNCTIONAL TESTS:  {Functional tests:24029}  GAIT: Distance walked: within clinic Assistive device utilized: {Assistive devices:23999} Level of assistance: {Levels of assistance:24026} Comments: ***  TODAY'S TREATMENT:                                                                                                                              [facility] Adult PT Treatment:                                                DATE: 04/11/22 Therapeutic Exercise: *** Manual Therapy: *** Neuromuscular re-ed: *** Therapeutic Activity: *** Modalities: *** Self Care: ***  PATIENT EDUCATION:  Education details: Pt education on PT impairments, prognosis, and POC. Informed consent. Rationale for interventions, safe/appropriate HEP performance Person educated: Patient Education method: Explanation, Demonstration, Tactile cues, Verbal cues, and Handouts Education comprehension: verbalized understanding, returned demonstration, verbal cues required, tactile cues required, and needs further education    HOME EXERCISE PROGRAM: ***  ASSESSMENT:  CLINICAL IMPRESSION: Patient is a 57 y.o. woman who was seen today for physical therapy evaluation and treatment for chronic low back pain. ***   OBJECTIVE IMPAIRMENTS: {opptimpairments:25111}.   ACTIVITY LIMITATIONS: {activitylimitations:27494}  PARTICIPATION LIMITATIONS: {participationrestrictions:25113}  PERSONAL FACTORS: {Personal factors:25162} are also affecting patient's functional outcome.   REHAB POTENTIAL: {rehabpotential:25112}  CLINICAL DECISION MAKING: {clinical decision making:25114}  EVALUATION COMPLEXITY: {Evaluation complexity:25115}   GOALS: Goals reviewed with patient?  {yes/no:20286}  SHORT TERM GOALS: Target date: ***  Pt will demonstrate appropriate understanding and performance of initially prescribed HEP in order to facilitate improved independence with management of symptoms.  Baseline: HEP provided on eval Goal status: INITIAL   2. Pt will score greater than or equal to *** on FOTO in order to demonstrate improved perception of function due to symptoms.  Baseline: ***  Goal status: INITIAL    LONG TERM GOALS: Target date: ***  Pt will score *** on FOTO in order to demonstrate improved perception of functional status due to symptoms.  Baseline: *** Goal status: INITIAL  2.  Pt will demonstrate *** lumbar AROM in order to demonstrate improved tolerance to functional movement patterns.   Baseline: *** Goal status: INITIAL  3.  Pt will demonstrate hip MMT of *** in order to demonstrate improved strength for functional movements.  Baseline: *** Goal status: INITIAL  4. Pt will perform 5xSTS in <*** sec in order to demonstrate reduced fall risk and improved functional independence. (MCID of 2.3sec)  Baseline: ***  Goal status: INITIAL   PLAN:  PT FREQUENCY: {rehab frequency:25116}  PT DURATION: {rehab duration:25117}  PLANNED INTERVENTIONS: {rehab planned interventions:25118::"Therapeutic exercises","Therapeutic activity","Neuromuscular re-education","Balance training","Gait training","Patient/Family education","Self Care","Joint mobilization"}.  PLAN FOR NEXT SESSION: Progress ROM/strengthening exercises as able/appropriate, review HEP.    Leeroy Cha PT, DPT 04/10/2022 1:58 PM

## 2022-04-10 NOTE — Patient Instructions (Signed)
1. OSA (obstructive sleep apnea)  - For home use only DME Hospital bed  2. Acute low back pain, unspecified back pain laterality, unspecified whether sciatica present  - For home use only DME Hospital bed  Follow up:  Follow up in 3 months

## 2022-04-11 ENCOUNTER — Ambulatory Visit: Payer: 59 | Admitting: Physical Therapy

## 2022-04-24 ENCOUNTER — Ambulatory Visit: Payer: 59

## 2022-04-30 ENCOUNTER — Other Ambulatory Visit: Payer: Self-pay

## 2022-05-01 ENCOUNTER — Other Ambulatory Visit: Payer: Self-pay

## 2022-05-01 MED ORDER — AMOXICILLIN-POT CLAVULANATE 875-125 MG PO TABS: 1.0000 | ORAL_TABLET | Freq: Two times a day (BID) | ORAL | 0 refills | Status: DC

## 2022-05-01 NOTE — Telephone Encounter (Signed)
From: [name] To: Office of Fenton Foy, NP Sent: 05/01/2022 8:24 AM EDT Subject: Medication Renewal Request  Refills have been requested for the following medications:   amoxicillin-clavulanate (AUGMENTIN) 875-125 MG tablet Kenney Houseman S Nichols]  [id number] [place], [place]. AT [place] Delivery method: Arlyss Gandy

## 2022-05-01 NOTE — Telephone Encounter (Signed)
Per pt for boils on thigh. Please advise. [place]

## 2022-05-01 NOTE — Telephone Encounter (Signed)
For her boils on her thigh. [place]

## 2022-05-02 ENCOUNTER — Telehealth: Payer: Self-pay

## 2022-05-02 ENCOUNTER — Telehealth (INDEPENDENT_AMBULATORY_CARE_PROVIDER_SITE_OTHER): Payer: 59 | Admitting: Nurse Practitioner

## 2022-05-02 ENCOUNTER — Encounter: Payer: Self-pay | Admitting: Nurse Practitioner

## 2022-05-02 DIAGNOSIS — G8929 Other chronic pain: Secondary | ICD-10-CM | POA: Diagnosis not present

## 2022-05-02 DIAGNOSIS — M5416 Radiculopathy, lumbar region: Secondary | ICD-10-CM

## 2022-05-02 DIAGNOSIS — M545 Low back pain, unspecified: Secondary | ICD-10-CM

## 2022-05-02 DIAGNOSIS — G4733 Obstructive sleep apnea (adult) (pediatric): Secondary | ICD-10-CM | POA: Diagnosis not present
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
## 2022-05-03 ENCOUNTER — Ambulatory Visit (INDEPENDENT_AMBULATORY_CARE_PROVIDER_SITE_OTHER): Payer: 59 | Admitting: Orthopaedic Surgery

## 2022-05-03 ENCOUNTER — Other Ambulatory Visit (INDEPENDENT_AMBULATORY_CARE_PROVIDER_SITE_OTHER): Payer: 59

## 2022-05-03 ENCOUNTER — Encounter: Payer: Self-pay | Admitting: Orthopaedic Surgery

## 2022-05-03 VITALS — BP 129/87 | HR 91 | Ht 67.0 in | Wt 242.0 lb

## 2022-05-03 DIAGNOSIS — M545 Low back pain, unspecified: Secondary | ICD-10-CM
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
## 2022-05-09 ENCOUNTER — Other Ambulatory Visit: Payer: Self-pay | Admitting: Nurse Practitioner

## 2022-05-09 DIAGNOSIS — J4520 Mild intermittent asthma, uncomplicated: Secondary | ICD-10-CM

## 2022-05-09 DIAGNOSIS — D869 Sarcoidosis, unspecified: Secondary | ICD-10-CM

## 2022-05-09 MED ORDER — ALBUTEROL SULFATE (2.5 MG/3ML) 0.083% IN NEBU: INHALATION_SOLUTION | RESPIRATORY_TRACT | 1 refills | Status: DC

## 2022-05-09 MED ORDER — ALBUTEROL SULFATE HFA 108 (90 BASE) MCG/ACT IN AERS: INHALATION_SPRAY | RESPIRATORY_TRACT | 1 refills | Status: DC

## 2022-05-20 ENCOUNTER — Ambulatory Visit: Payer: 59 | Admitting: Physical Therapy

## 2022-05-20 ENCOUNTER — Ambulatory Visit: Payer: 59

## 2022-05-30 ENCOUNTER — Other Ambulatory Visit: Payer: Self-pay | Admitting: Nurse Practitioner

## 2022-05-30 ENCOUNTER — Other Ambulatory Visit: Payer: Self-pay

## 2022-05-30 NOTE — Telephone Encounter (Signed)
Please advise KH 

## 2022-05-30 NOTE — Telephone Encounter (Signed)
From: Garlan Fillers To: Office of Ivonne Andrew, NP Sent: 05/30/2022 1:56 PM EDT Subject: Medication Renewal Request  Refills have been requested for the following medications:   amoxicillin-clavulanate (AUGMENTIN) 875-125 MG tablet Archie Patten S Nichols]  [id number] , [place] - [address] AT [facility] [address] [facility] [address] Delivery method: Daryll Drown

## 2022-05-31 ENCOUNTER — Other Ambulatory Visit: Payer: Self-pay | Admitting: Nurse Practitioner

## 2022-05-31 MED ORDER — DOXYCYCLINE HYCLATE 100 MG PO TABS: 100.0000 mg | ORAL_TABLET | Freq: Two times a day (BID) | ORAL | 0 refills | Status: AC

## 2022-06-03 ENCOUNTER — Telehealth: Payer: Self-pay | Admitting: Nurse Practitioner

## 2022-06-03 NOTE — Telephone Encounter (Signed)
Pt called stating wrong antibiotic sent in, correct medication is amoxicillian/ claz 800 mg-125

## 2022-06-05 ENCOUNTER — Other Ambulatory Visit: Payer: Self-pay | Admitting: Nurse Practitioner

## 2022-06-05 DIAGNOSIS — J309 Allergic rhinitis, unspecified: Secondary | ICD-10-CM

## 2022-06-07 ENCOUNTER — Ambulatory Visit: Payer: 59 | Admitting: Orthopaedic Surgery

## 2022-06-10 ENCOUNTER — Other Ambulatory Visit: Payer: Self-pay | Admitting: Nurse Practitioner

## 2022-06-10 MED ORDER — AMOXICILLIN-POT CLAVULANATE 875-125 MG PO TABS: 1.0000 | ORAL_TABLET | Freq: Two times a day (BID) | ORAL | 0 refills | Status: DC

## 2022-07-02 ENCOUNTER — Ambulatory Visit
Admission: RE | Admit: 2022-07-02 | Discharge: 2022-07-02 | Disposition: A | Discharge status: Home / Self Care | Payer: 59 | Source: Ambulatory Visit | Attending: Nurse Practitioner | Admitting: Nurse Practitioner

## 2022-07-02 DIAGNOSIS — Z1231 Encounter for screening mammogram for malignant neoplasm of breast: Secondary | ICD-10-CM

## 2022-07-04 ENCOUNTER — Other Ambulatory Visit: Payer: Self-pay

## 2022-07-04 MED ORDER — ONDANSETRON HCL 4 MG PO TABS: 4.0000 mg | ORAL_TABLET | Freq: Three times a day (TID) | ORAL | 0 refills | Status: DC | PRN

## 2022-07-04 MED ORDER — AMOXICILLIN-POT CLAVULANATE 875-125 MG PO TABS: 1.0000 | ORAL_TABLET | Freq: Two times a day (BID) | ORAL | 0 refills | Status: DC

## 2022-07-04 NOTE — Telephone Encounter (Signed)
Please advise KH 

## 2022-07-04 NOTE — Telephone Encounter (Signed)
From: Garlan Fillers To: Office of Ivonne Andrew, NP Sent: 07/04/2022 2:14 PM EDT Subject: Medication Renewal Request  Refills have been requested for the following medications:   ondansetron (ZOFRAN) 4 MG tablet Crystal Bullock]  Preferred pharmacy: [id number] - [place], [place] - [address] AT [facility] OF [facility] [address] Delivery method: Daryll Drown

## 2022-07-23 ENCOUNTER — Telehealth: Payer: Self-pay

## 2022-07-23 NOTE — Telephone Encounter (Signed)
Just FYI. Nurse call from [facility]. Pt had a home visit and her A1c was 8.0. Next apt is 08/12/22.

## 2022-07-31 ENCOUNTER — Telehealth: Payer: Self-pay

## 2022-07-31 ENCOUNTER — Other Ambulatory Visit: Payer: Self-pay | Admitting: Nurse Practitioner

## 2022-07-31 ENCOUNTER — Ambulatory Visit: Payer: 59 | Admitting: Nurse Practitioner

## 2022-07-31 MED ORDER — AMOXICILLIN-POT CLAVULANATE 875-125 MG PO TABS: 1.0000 | ORAL_TABLET | Freq: Two times a day (BID) | ORAL | 0 refills | Status: DC

## 2022-07-31 NOTE — Telephone Encounter (Signed)
Pt was advised KH 

## 2022-07-31 NOTE — Telephone Encounter (Signed)
Pt was called to cancel her pap and she advised that she needs abx boils on her left leg. Please advise if you will send them in.

## 2022-08-05 ENCOUNTER — Ambulatory Visit: Payer: Self-pay | Admitting: Nurse Practitioner

## 2022-08-12 ENCOUNTER — Encounter: Payer: Self-pay | Admitting: Nurse Practitioner

## 2022-08-12 ENCOUNTER — Ambulatory Visit (INDEPENDENT_AMBULATORY_CARE_PROVIDER_SITE_OTHER): Payer: 59 | Admitting: Nurse Practitioner

## 2022-08-12 VITALS — BP 138/83 | HR 90 | Temp 97.1°F | Wt 249.0 lb

## 2022-08-12 DIAGNOSIS — Z13220 Encounter for screening for lipoid disorders: Secondary | ICD-10-CM

## 2022-08-12 DIAGNOSIS — J4520 Mild intermittent asthma, uncomplicated: Secondary | ICD-10-CM

## 2022-08-12 DIAGNOSIS — E119 Type 2 diabetes mellitus without complications: Secondary | ICD-10-CM | POA: Diagnosis not present

## 2022-08-12 DIAGNOSIS — B379 Candidiasis, unspecified: Secondary | ICD-10-CM

## 2022-08-12 DIAGNOSIS — D869 Sarcoidosis, unspecified: Secondary | ICD-10-CM

## 2022-08-12 DIAGNOSIS — L732 Hidradenitis suppurativa: Secondary | ICD-10-CM

## 2022-08-12 LAB — POCT GLYCOSYLATED HEMOGLOBIN (HGB A1C): Hemoglobin A1C: 6.7 % — AB (ref 4.0–5.6)

## 2022-08-12 MED ORDER — FLUCONAZOLE 150 MG PO TABS
150.0000 mg | ORAL_TABLET | Freq: Every day | ORAL | 0 refills | Status: AC
Start: 2022-08-12 — End: 2022-08-13

## 2022-08-12 MED ORDER — OZEMPIC (0.25 OR 0.5 MG/DOSE) 2 MG/3ML ~~LOC~~ SOPN: 0.2500 mg | PEN_INJECTOR | SUBCUTANEOUS | 1 refills | Status: DC

## 2022-08-12 MED ORDER — OZEMPIC (0.25 OR 0.5 MG/DOSE) 2 MG/3ML ~~LOC~~ SOPN
0.5000 mg | PEN_INJECTOR | SUBCUTANEOUS | 1 refills | Status: DC
Start: 2022-08-12 — End: 2022-10-09

## 2022-08-12 MED ORDER — ALBUTEROL SULFATE HFA 108 (90 BASE) MCG/ACT IN AERS
INHALATION_SPRAY | RESPIRATORY_TRACT | 1 refills | Status: DC
Start: 2022-08-12 — End: 2023-07-14

## 2022-08-12 NOTE — Progress Notes (Signed)
@Patient  ID: Crystal Bullock, female    DOB: 07-31-1965, 57 y.o.   MRN: [id number]  Chief Complaint  Patient presents with   Recurrent Skin Infections    Referring provider: Ivonne Andrew, NP   HPI  Patient presents today for follow-up visit.  She is currently followed by dermatology for hidradenitis.  We discussed that she does need to follow-up with them for this issue.  Patient does need a form completed today for social services for daycare for her grandchildren.  She is out of work currently due to sarcoidosis and lumbar radiculopathy.  Pain management (Dr. Lucretia Field) is advising physical therapy but patient cannot go to physical therapy sessions due to caring for her grandkids.  Social services will get her established with daycare.  Patient has been going to [facility] for weight loss and was recently on Ozempic.  This was discontinued.  A1c in office today 6.7.  We will restart Ozempic today.  Patient is requesting Diflucan for yeast infection.  Denies f/c/s, n/v/d, hemoptysis, PND, leg swelling Denies chest pain or edema       Allergies  Allergen Reactions   Bee Venom Anaphylaxis   Iodinated Contrast Media Rash    Hives on hands and feet per pt 08/16/15 pt had scan w/ contrast and had a 4 hr premedication protocol done     There is no immunization history on file for this patient.  Past Medical History:  Diagnosis Date   Anxiety 10/06/2015   Asthma    Atypical chest pain 10/06/2015   Chronic neck pain    Fibroid    Hypertension    Palpitations 10/06/2015   RBBB 10/06/2015   Sarcoidosis    Scoliosis    Seasonal allergies    Sickle cell trait (HCC)    Vitamin D deficiency 05/2019    Tobacco History: Social History   Tobacco Use  Smoking Status Never  Smokeless Tobacco Never   Counseling given: Not Answered   Outpatient Encounter Medications as of 08/12/2022  Medication Sig   albuterol (PROVENTIL) (2.5 MG/3ML) 0.083% nebulizer solution USE 1 VIAL VIA  NEBULIZER EVERY 6 HOURS AS NEEDED FOR WHEEZING OR SHORTNESS OF BREATH   amLODipine (NORVASC) 10 MG tablet TAKE 1 TABLET(10 MG) BY MOUTH DAILY (Patient taking differently: Take 10 mg by mouth daily.)   amoxicillin-clavulanate (AUGMENTIN) 875-125 MG tablet Take 1 tablet by mouth 2 (two) times daily.   ascorbic acid (VITAMIN C) 500 MG tablet Take 1 tablet (500 mg total) by mouth daily.   baclofen (LIORESAL) 10 MG tablet Take 10 mg by mouth 2 (two) times daily as needed for muscle spasms.   busPIRone (BUSPAR) 7.5 MG tablet Take 1 tablet (7.5 mg total) by mouth 3 (three) times daily. (Patient taking differently: Take 7.5 mg by mouth 2 (two) times daily as needed (anxiety).)   cetirizine (ZYRTEC) 10 MG tablet TAKE 1 TABLET BY MOUTH DAILY AS NEEDED FOR ALLERGIES   clindamycin (CLINDAGEL) 1 % gel Apply topically 2 (two) times daily.   cyclobenzaprine (FLEXERIL) 10 MG tablet Take 1 tablet (10 mg total) by mouth 3 (three) times daily as needed for muscle spasms.   esomeprazole (NEXIUM) 40 MG capsule Take 1 capsule (40 mg total) by mouth daily.   fluconazole (DIFLUCAN) 150 MG tablet Take 1 tablet (150 mg total) by mouth daily for 1 day.   fluticasone (FLONASE) 50 MCG/ACT nasal spray Place 2 sprays into both nostrils daily as needed. allergies   hydrOXYzine (VISTARIL) 50  MG capsule Take as prescribed as AVS.   LINZESS 72 MCG capsule Take 1 capsule (72 mcg total) by mouth daily.   meloxicam (MOBIC) 15 MG tablet Take 15 mg by mouth daily.   methocarbamol (ROBAXIN) 500 MG tablet Take 1 tablet (500 mg total) by mouth every 6 (six) hours as needed for muscle spasms.   Multiple Vitamins-Minerals (MULTIVITAMINS THER. W/MINERALS) TABS tablet Take 1 tablet by mouth daily.   NON FORMULARY CPAP at bedtime   nystatin ointment (MYCOSTATIN) Apply 1 application. topically 2 (two) times daily.   ondansetron (ZOFRAN) 4 MG tablet Take 1 tablet (4 mg total) by mouth every 8 (eight) hours as needed for nausea or vomiting.    oxyCODONE-acetaminophen (PERCOCET) 10-325 MG tablet Take 1 tablet by mouth every 8 (eight) hours as needed for pain.   promethazine (PHENERGAN) 25 MG suppository Place 25 mg rectally every 6 (six) hours as needed for nausea or vomiting.   [DISCONTINUED] albuterol (VENTOLIN HFA) 108 (90 Base) MCG/ACT inhaler INHALE 2 PUFFS INTO THE LUNGS EVERY 6 HOURS FOR UP TO 90 DOSES AS NEEDED FOR WHEEZING   albuterol (VENTOLIN HFA) 108 (90 Base) MCG/ACT inhaler INHALE 2 PUFFS INTO THE LUNGS EVERY 6 HOURS FOR UP TO 90 DOSES AS NEEDED FOR WHEEZING   Semaglutide,0.25 or 0.5MG /DOS, (OZEMPIC, 0.25 OR 0.5 MG/DOSE,) 2 MG/3ML SOPN Inject 0.5 mg into the skin once a week.   sucralfate (CARAFATE) 1 g tablet Take 1 tablet (1 g total) by mouth 4 (four) times daily as needed. (Patient not taking: Reported on 03/04/2022)   Vitamin D, Ergocalciferol, (DRISDOL) 1.25 MG (50000 UNIT) CAPS capsule Take 1 capsule (50,000 Units total) by mouth every 7 (seven) days. (Patient not taking: Reported on 08/12/2022)   [DISCONTINUED] Semaglutide,0.25 or 0.5MG /DOS, (OZEMPIC, 0.25 OR 0.5 MG/DOSE,) 2 MG/3ML SOPN Inject 0.25 mg into the skin once a week. (Patient not taking: Reported on 08/12/2022)   [DISCONTINUED] Semaglutide,0.25 or 0.5MG /DOS, (OZEMPIC, 0.25 OR 0.5 MG/DOSE,) 2 MG/3ML SOPN Inject 0.25 mg into the skin once a week.   No facility-administered encounter medications on file as of 08/12/2022.     Review of Systems  Review of Systems  Constitutional: Negative.   HENT: Negative.    Cardiovascular: Negative.   Gastrointestinal: Negative.   Allergic/Immunologic: Negative.   Neurological: Negative.   Psychiatric/Behavioral: Negative.         Physical Exam  BP 138/83   Pulse 90   Temp (!) 97.1 F (36.2 C)   Wt 249 lb (112.9 kg)   LMP 08/24/2015 Comment: irregular periods per pt 09/12/15  SpO2 99%   BMI 39.00 kg/m   Wt Readings from Last 5 Encounters:  08/12/22 249 lb (112.9 kg)  05/03/22 242 lb (109.8 kg)  03/04/22 244 lb  (110.7 kg)  12/22/21 248 lb 7.3 oz (112.7 kg)  09/26/21 245 lb 12.8 oz (111.5 kg)     Physical Exam Vitals and nursing note reviewed.  Constitutional:      General: She is not in acute distress.    Appearance: She is well-developed.  Cardiovascular:     Rate and Rhythm: Normal rate and regular rhythm.  Pulmonary:     Effort: Pulmonary effort is normal.     Breath sounds: Normal breath sounds.  Neurological:     Mental Status: She is alert and oriented to person, place, and time.      Lab Results:  CBC    Component Value Date/Time   WBC 10.3 03/04/2022 1341   WBC  9.4 12/23/2021 0147   RBC 4.93 03/04/2022 1341   RBC 3.92 12/23/2021 0147   HGB 12.4 03/04/2022 1341   HCT 37.1 03/04/2022 1341   PLT 317 03/04/2022 1341   MCV 75 (L) 03/04/2022 1341   MCH 25.2 (L) 03/04/2022 1341   MCH 25.5 (L) 12/23/2021 0147   MCHC 33.4 03/04/2022 1341   MCHC 34.8 12/23/2021 0147   RDW 14.8 03/04/2022 1341   LYMPHSABS 2.3 12/22/2021 0335   LYMPHSABS 2.0 12/15/2020 1055   MONOABS 0.8 12/22/2021 0335   EOSABS 0.6 (H) 12/22/2021 0335   EOSABS 0.1 12/15/2020 1055   BASOSABS 0.1 12/22/2021 0335   BASOSABS 0.1 12/15/2020 1055    BMET    Component Value Date/Time   NA 141 03/04/2022 1341   K 4.1 03/04/2022 1341   CL 103 03/04/2022 1341   CO2 23 03/04/2022 1341   GLUCOSE 87 03/04/2022 1341   GLUCOSE 188 (H) 12/23/2021 0147   BUN 12 03/04/2022 1341   CREATININE 1.17 (H) 03/04/2022 1341   CALCIUM 9.8 03/04/2022 1341   GFRNONAA 58 (L) 12/23/2021 0147   GFRAA >60 09/10/2019 1534    BNP No results found for: "BNP"  ProBNP    Component Value Date/Time   PROBNP 32.0 05/12/2009 1140    Imaging: No results found.   Assessment & Plan:   Hidradenitis suppurativa Follow with dermatology  2. Yeast infection  - fluconazole (DIFLUCAN) 150 MG tablet; Take 1 tablet (150 mg total) by mouth daily for 1 day.  Dispense: 1 tablet; Refill: 0  3. Type 2 diabetes mellitus without  complication, without long-term current use of insulin (HCC)  - Semaglutide,0.25 or 0.5MG /DOS, (OZEMPIC, 0.25 OR 0.5 MG/DOSE,) 2 MG/3ML SOPN; Inject 0.5 mg into the skin once a week.  Dispense: 3 mL; Refill: 1 - CBC - Comprehensive metabolic panel  4. Lipid screening  - Lipid Panel  5. Sarcoidosis  - albuterol (VENTOLIN HFA) 108 (90 Base) MCG/ACT inhaler; INHALE 2 PUFFS INTO THE LUNGS EVERY 6 HOURS FOR UP TO 90 DOSES AS NEEDED FOR WHEEZING  Dispense: 6.7 g; Refill: 1  6. Mild intermittent asthma without complication  - albuterol (VENTOLIN HFA) 108 (90 Base) MCG/ACT inhaler; INHALE 2 PUFFS INTO THE LUNGS EVERY 6 HOURS FOR UP TO 90 DOSES AS NEEDED FOR WHEEZING  Dispense: 6.7 g; Refill: 1  Follow up:  Follow up in 3 months     Ivonne Andrew, NP 08/12/2022

## 2022-08-12 NOTE — Patient Instructions (Signed)
1. Hidradenitis suppurativa  Follow with dermatology  2. Yeast infection  - fluconazole (DIFLUCAN) 150 MG tablet; Take 1 tablet (150 mg total) by mouth daily for 1 day.  Dispense: 1 tablet; Refill: 0  3. Type 2 diabetes mellitus without complication, without long-term current use of insulin (HCC)  - Semaglutide,0.25 or 0.5MG /DOS, (OZEMPIC, 0.25 OR 0.5 MG/DOSE,) 2 MG/3ML SOPN; Inject 0.5 mg into the skin once a week.  Dispense: 3 mL; Refill: 1 - CBC - Comprehensive metabolic panel  4. Lipid screening  - Lipid Panel  5. Sarcoidosis  - albuterol (VENTOLIN HFA) 108 (90 Base) MCG/ACT inhaler; INHALE 2 PUFFS INTO THE LUNGS EVERY 6 HOURS FOR UP TO 90 DOSES AS NEEDED FOR WHEEZING  Dispense: 6.7 g; Refill: 1  6. Mild intermittent asthma without complication  - albuterol (VENTOLIN HFA) 108 (90 Base) MCG/ACT inhaler; INHALE 2 PUFFS INTO THE LUNGS EVERY 6 HOURS FOR UP TO 90 DOSES AS NEEDED FOR WHEEZING  Dispense: 6.7 g; Refill: 1  Follow up:  Follow up in 3 months   Hidradenitis Suppurativa Hidradenitis suppurativa is a long-term (chronic) skin disease. It is similar to a severe form of acne, but it affects areas of the body where acne would be unusual, especially areas of the body where skin rubs against skin and becomes moist. These include: Underarms. Groin. Genital area. Buttocks. Upper thighs. Breasts. Hidradenitis suppurativa may start out as small lumps or pimples caused by blocked skin pores, sweat glands, or hair follicles. Pimples may develop into deep sores that break open (rupture) and drain pus. Over time, affected areas of skin may thicken and become scarred. This condition is rare and does not spread from person to person (non-contagious). What are the causes? The exact cause of this condition is not known. It may be related to: Female and female hormones. An overactive disease-fighting system (immune system). The immune system may over-react to blocked hair follicles or  sweat glands and cause swelling and pus-filled sores. What increases the risk? You are more likely to develop this condition if you: Are female. Are 55-55 years old. Have a family history of hidradenitis suppurativa. Have a personal history of acne. Are overweight. Smoke. Take the medicine lithium. What are the signs or symptoms? The first symptoms are usually painful bumps in the skin, similar to pimples. The condition may get worse over time (progress), or it may only cause mild symptoms. If the disease progresses, symptoms may include: Skin bumps getting bigger and growing deeper into the skin. Bumps rupturing and draining pus. Itchy, infected skin. Skin getting thicker and scarred. Tunnels under the skin (fistulas) where pus drains from a bump. Pain during daily activities, such as pain during walking if your groin area is affected. Emotional problems, such as stress or depression. This condition may affect your appearance and your ability or willingness to wear certain clothes or do certain activities. How is this diagnosed? This condition is diagnosed by a health care provider who specializes in skin conditions (dermatologist). You may be diagnosed based on: Your symptoms and medical history. A physical exam. Testing a pus sample for infection. Blood tests. How is this treated? Your treatment will depend on how severe your symptoms are. The same treatment will not work for everybody with this condition. You may need to try several treatments to find what works best for you. Treatment may include: Cleaning and bandaging (dressing) your wounds as needed. Lifestyle changes, such as new skin care routines. Taking medicines, such as: Antibiotics.  Acne medicines. Medicines to reduce the activity of the immune system. A diabetes medicine (metformin). Birth control pills, for women. Steroids to reduce swelling and pain. Working with a mental health care provider, if you experience  emotional distress due to this condition. If you have severe symptoms that do not get better with medicine, you may need surgery. Surgery may involve: Using a laser to clear the skin and remove hair follicles. Opening and draining deep sores. Removing the areas of skin that are diseased and scarred. Follow these instructions at home: Medicines  Take over-the-counter and prescription medicines only as told by your health care provider. If you were prescribed antibiotics, take them as told by your health care provider. Do not stop using the antibiotic even if your condition improves. Skin care If you have open wounds, cover them with a clean dressing as told by your health care provider. Keep wounds clean by washing them gently with soap and water when you bathe. Do not shave the areas where you get hidradenitis suppurativa. Wear loose-fitting clothes. Try to avoid getting overheated or sweaty. If you get sweaty or wet, change into clean, dry clothes as soon as you can. To help relieve pain and itchiness, cover sore areas with a warm, clean washcloth (warm compress) for 5-10 minutes as often as needed. Your healthcare provider may recommend an antiperspirant deodorant that may be gentle on your skin. A daily antiseptic wash to cleanse affected areas may be suggested by your healthcare provider. General instructions Learn as much as you can about your disease so that you have an active role in your treatment. Work closely with your health care provider to find treatments that work for you. If you are overweight, work with your health care provider to lose weight as recommended. Do not use any products that contain nicotine or tobacco. These products include cigarettes, chewing tobacco, and vaping devices, such as e-cigarettes. If you need help quitting, ask your health care provider. If you struggle with living with this condition, talk with your health care provider or work with a mental health  care provider as recommended. Keep all follow-up visits. Where to find more information Hidradenitis Suppurativa Foundation, Inc.: www.hs-foundation.org [employer] Academy [employer] Dermatology: InfoExam.si Contact a health care provider if: You have a flare-up of hidradenitis suppurativa. You have a fever or chills. You have trouble controlling your symptoms at home. You have trouble doing your daily activities because of your symptoms. You have trouble dealing with emotional problems related to your condition. Summary Hidradenitis suppurativa is a long-term (chronic) skin disease. It is similar to a severe form of acne, but it affects areas of the body where acne would be unusual. The first symptoms are usually painful bumps in the skin, similar to pimples. The condition may only cause mild symptoms, or it may get worse over time (progress). If you have open wounds, cover them with a clean dressing as told by your health care provider. Keep wounds clean by washing them gently with soap and water when you bathe. Besides skin care, treatment may include medicines, laser treatment, and surgery. This information is not intended to replace advice given to you by your health care provider. Make sure you discuss any questions you have with your health care provider. Document Revised: 03/21/2021 Document Reviewed: 03/21/2021 Elsevier Patient Education  2024 [employer].

## 2022-08-12 NOTE — Addendum Note (Signed)
Addended by: Renelda Loma on: 08/12/2022 10:03 AM   Modules accepted: Orders

## 2022-08-12 NOTE — Assessment & Plan Note (Signed)
Follow with dermatology  2. Yeast infection  - fluconazole (DIFLUCAN) 150 MG tablet; Take 1 tablet (150 mg total) by mouth daily for 1 day.  Dispense: 1 tablet; Refill: 0  3. Type 2 diabetes mellitus without complication, without long-term current use of insulin (HCC)  - Semaglutide,0.25 or 0.5MG /DOS, (OZEMPIC, 0.25 OR 0.5 MG/DOSE,) 2 MG/3ML SOPN; Inject 0.5 mg into the skin once a week.  Dispense: 3 mL; Refill: 1 - CBC - Comprehensive metabolic panel  4. Lipid screening  - Lipid Panel  5. Sarcoidosis  - albuterol (VENTOLIN HFA) 108 (90 Base) MCG/ACT inhaler; INHALE 2 PUFFS INTO THE LUNGS EVERY 6 HOURS FOR UP TO 90 DOSES AS NEEDED FOR WHEEZING  Dispense: 6.7 g; Refill: 1  6. Mild intermittent asthma without complication  - albuterol (VENTOLIN HFA) 108 (90 Base) MCG/ACT inhaler; INHALE 2 PUFFS INTO THE LUNGS EVERY 6 HOURS FOR UP TO 90 DOSES AS NEEDED FOR WHEEZING  Dispense: 6.7 g; Refill: 1  Follow up:  Follow up in 3 months

## 2022-08-13 LAB — CBC
Hematocrit: 37.1 % (ref 34.0–46.6)
Hemoglobin: 12.4 g/dL (ref 11.1–15.9)
MCH: 25.3 pg — ABNORMAL LOW (ref 26.6–33.0)
MCHC: 33.4 g/dL (ref 31.5–35.7)
MCV: 76 fL — ABNORMAL LOW (ref 79–97)
Platelets: 291 10*3/uL (ref 150–450)
RBC: 4.9 x10E6/uL (ref 3.77–5.28)
RDW: 15.2 % (ref 11.7–15.4)
WBC: 12.2 10*3/uL — ABNORMAL HIGH (ref 3.4–10.8)

## 2022-08-13 LAB — LIPID PANEL
Chol/HDL Ratio: 4 ratio (ref 0.0–4.4)
Cholesterol, Total: 148 mg/dL (ref 100–199)
HDL: 37 mg/dL — ABNORMAL LOW (ref >39)
LDL Chol Calc (NIH): 75 mg/dL (ref 0–99)
Triglycerides: 214 mg/dL — ABNORMAL HIGH (ref 0–149)
VLDL Cholesterol Cal: 36 mg/dL (ref 5–40)

## 2022-08-13 LAB — COMPREHENSIVE METABOLIC PANEL
ALT: 21 IU/L (ref 0–32)
AST: 18 IU/L (ref 0–40)
Albumin: 4.1 g/dL (ref 3.8–4.9)
Alkaline Phosphatase: 71 IU/L (ref 44–121)
BUN/Creatinine Ratio: 13 (ref 9–23)
BUN: 20 mg/dL (ref 6–24)
Bilirubin Total: 0.2 mg/dL (ref 0.0–1.2)
CO2: 24 mmol/L (ref 20–29)
Calcium: 9.6 mg/dL (ref 8.7–10.2)
Chloride: 102 mmol/L (ref 96–106)
Creatinine, Ser: 1.54 mg/dL — ABNORMAL HIGH (ref 0.57–1.00)
Globulin, Total: 3.4 g/dL (ref 1.5–4.5)
Glucose: 113 mg/dL — ABNORMAL HIGH (ref 70–99)
Potassium: 4 mmol/L (ref 3.5–5.2)
Sodium: 140 mmol/L (ref 134–144)
Total Protein: 7.5 g/dL (ref 6.0–8.5)
eGFR: 40 mL/min/{1.73_m2} — ABNORMAL LOW (ref >59)

## 2022-08-30 ENCOUNTER — Ambulatory Visit: Payer: Self-pay | Admitting: Nurse Practitioner

## 2022-09-04 ENCOUNTER — Other Ambulatory Visit: Payer: Self-pay

## 2022-09-04 MED ORDER — AMOXICILLIN-POT CLAVULANATE 875-125 MG PO TABS: 1.0000 | ORAL_TABLET | Freq: Two times a day (BID) | ORAL | 0 refills | Status: DC

## 2022-09-04 MED ORDER — ONDANSETRON HCL 4 MG PO TABS: 4.0000 mg | ORAL_TABLET | Freq: Three times a day (TID) | ORAL | 0 refills | Status: DC | PRN

## 2022-09-04 NOTE — Telephone Encounter (Signed)
Please advise KH 

## 2022-09-04 NOTE — Telephone Encounter (Signed)
Please advise Kh 

## 2022-09-05 ENCOUNTER — Other Ambulatory Visit: Payer: Self-pay

## 2022-09-05 DIAGNOSIS — I10 Essential (primary) hypertension: Secondary | ICD-10-CM

## 2022-09-05 MED ORDER — AMLODIPINE BESYLATE 10 MG PO TABS
ORAL_TABLET | ORAL | 0 refills | Status: AC
Start: 2022-09-05 — End: ?

## 2022-09-24 ENCOUNTER — Telehealth: Payer: Self-pay | Admitting: Nurse Practitioner

## 2022-09-24 NOTE — Telephone Encounter (Signed)
Please call pt regarding her pain management Dr. Is leaving town and suggested our office prescribe her pain meds from now on

## 2022-10-08 ENCOUNTER — Other Ambulatory Visit: Payer: Self-pay | Admitting: Nurse Practitioner

## 2022-10-08 DIAGNOSIS — E119 Type 2 diabetes mellitus without complications: Secondary | ICD-10-CM

## 2022-10-17 ENCOUNTER — Other Ambulatory Visit: Payer: Self-pay | Admitting: Nurse Practitioner

## 2022-10-17 DIAGNOSIS — G8929 Other chronic pain: Secondary | ICD-10-CM

## 2022-10-30 LAB — MICROALBUMIN / CREATININE URINE RATIO: Microalb Creat Ratio: 29.999

## 2022-10-31 ENCOUNTER — Other Ambulatory Visit: Payer: Self-pay | Admitting: Nurse Practitioner

## 2022-10-31 DIAGNOSIS — L732 Hidradenitis suppurativa: Secondary | ICD-10-CM

## 2022-10-31 MED ORDER — AMOXICILLIN-POT CLAVULANATE 875-125 MG PO TABS: 1.0000 | ORAL_TABLET | Freq: Two times a day (BID) | ORAL | 0 refills | Status: DC

## 2022-11-13 ENCOUNTER — Encounter: Payer: Self-pay | Admitting: Nurse Practitioner

## 2022-11-13 ENCOUNTER — Ambulatory Visit (INDEPENDENT_AMBULATORY_CARE_PROVIDER_SITE_OTHER): Payer: 59 | Admitting: Nurse Practitioner

## 2022-11-13 VITALS — BP 147/76 | HR 88 | Temp 97.3°F | Wt 252.0 lb

## 2022-11-13 DIAGNOSIS — L732 Hidradenitis suppurativa: Secondary | ICD-10-CM | POA: Diagnosis not present

## 2022-11-13 DIAGNOSIS — I10 Essential (primary) hypertension: Secondary | ICD-10-CM

## 2022-11-13 DIAGNOSIS — E559 Vitamin D deficiency, unspecified: Secondary | ICD-10-CM

## 2022-11-13 DIAGNOSIS — M5441 Lumbago with sciatica, right side: Secondary | ICD-10-CM | POA: Diagnosis not present

## 2022-11-13 DIAGNOSIS — E66812 Obesity, class 2: Secondary | ICD-10-CM | POA: Diagnosis not present

## 2022-11-13 DIAGNOSIS — J309 Allergic rhinitis, unspecified: Secondary | ICD-10-CM

## 2022-11-13 DIAGNOSIS — Z6837 Body mass index (BMI) 37.0-37.9, adult: Secondary | ICD-10-CM | POA: Diagnosis not present

## 2022-11-13 DIAGNOSIS — G8929 Other chronic pain: Secondary | ICD-10-CM

## 2022-11-13 LAB — POCT GLYCOSYLATED HEMOGLOBIN (HGB A1C): Hemoglobin A1C: 6.6 % — AB (ref 4.0–5.6)

## 2022-11-13 MED ORDER — CETIRIZINE HCL 10 MG PO TABS
ORAL_TABLET | ORAL | 2 refills | Status: DC
Start: 2022-11-13 — End: 2023-12-03

## 2022-11-13 MED ORDER — VITAMIN D (ERGOCALCIFEROL) 1.25 MG (50000 UNIT) PO CAPS
50000.0000 [IU] | ORAL_CAPSULE | ORAL | 6 refills | Status: DC
Start: 2022-11-13 — End: 2023-08-28

## 2022-11-13 MED ORDER — PROMETHAZINE HCL 12.5 MG PO TABS: 12.5000 mg | ORAL_TABLET | Freq: Three times a day (TID) | ORAL | 0 refills | Status: DC | PRN

## 2022-11-13 MED ORDER — AMLODIPINE BESYLATE 10 MG PO TABS
ORAL_TABLET | ORAL | 0 refills | Status: DC
Start: 2022-11-13 — End: 2022-11-27

## 2022-11-13 NOTE — Progress Notes (Signed)
Subjective   Patient ID: Crystal Bullock, female    DOB: [birth year], 57 y.o.   MRN: [id number]  Chief Complaint  Patient presents with   Hypertension   Diabetes    Referring provider: Ivonne Andrew, NP  Morton Stall Sanon is a 57 y.o. female with Past Medical History: 10/06/2015: Anxiety No date: Asthma 10/06/2015: Atypical chest pain No date: Chronic neck pain No date: Fibroid No date: Hypertension 10/06/2015: Palpitations 10/06/2015: RBBB No date: Sarcoidosis No date: Scoliosis No date: Seasonal allergies No date: Sickle cell trait (HCC) 05/2019: Vitamin D deficiency  HPI  Patient presents today for follow-up visit.  She is out of work currently due to sarcoidosis and lumbar radiculopathy.  Pain management (Dr. Lucretia Field) is advising physical therapy.  We will place referral to physical therapy today.  Patient does need a referral to dermatology as well for hidradenitis suppurative. A1c in office today 6.6.  Patient states that Ozempic is making her nauseated.  We will place a referral to pharmacy for medication management. denies f/c/s, n/v/d, hemoptysis, PND, leg swelling. Denies chest pain or edema.    Allergies  Allergen Reactions   Bee Venom Anaphylaxis   Iodinated Contrast Media Rash    Hives on hands and feet per pt 08/16/15 pt had scan w/ contrast and had a 4 hr premedication protocol done     There is no immunization history on file for this patient.  Tobacco History: Social History   Tobacco Use  Smoking Status Never  Smokeless Tobacco Never   Counseling given: Not Answered   Outpatient Encounter Medications as of 11/13/2022  Medication Sig   albuterol (PROVENTIL) (2.5 MG/3ML) 0.083% nebulizer solution USE 1 VIAL VIA NEBULIZER EVERY 6 HOURS AS NEEDED FOR WHEEZING OR SHORTNESS OF BREATH   albuterol (VENTOLIN HFA) 108 (90 Base) MCG/ACT inhaler INHALE 2 PUFFS INTO THE LUNGS EVERY 6 HOURS FOR UP TO 90 DOSES AS NEEDED FOR WHEEZING    amoxicillin-clavulanate (AUGMENTIN) 875-125 MG tablet Take 1 tablet by mouth 2 (two) times daily.   ascorbic acid (VITAMIN C) 500 MG tablet Take 1 tablet (500 mg total) by mouth daily.   baclofen (LIORESAL) 10 MG tablet Take 10 mg by mouth 2 (two) times daily as needed for muscle spasms.   busPIRone (BUSPAR) 7.5 MG tablet Take 1 tablet (7.5 mg total) by mouth 3 (three) times daily. (Patient taking differently: Take 7.5 mg by mouth 2 (two) times daily as needed (anxiety).)   clindamycin (CLINDAGEL) 1 % gel Apply topically 2 (two) times daily.   cyclobenzaprine (FLEXERIL) 10 MG tablet Take 1 tablet (10 mg total) by mouth 3 (three) times daily as needed for muscle spasms.   esomeprazole (NEXIUM) 40 MG capsule Take 1 capsule (40 mg total) by mouth daily.   fluticasone (FLONASE) 50 MCG/ACT nasal spray Place 2 sprays into both nostrils daily as needed. allergies   hydrOXYzine (VISTARIL) 50 MG capsule Take as prescribed as AVS.   LINZESS 72 MCG capsule Take 1 capsule (72 mcg total) by mouth daily.   meloxicam (MOBIC) 15 MG tablet Take 15 mg by mouth daily.   methocarbamol (ROBAXIN) 500 MG tablet Take 1 tablet (500 mg total) by mouth every 6 (six) hours as needed for muscle spasms.   Multiple Vitamins-Minerals (MULTIVITAMINS THER. W/MINERALS) TABS tablet Take 1 tablet by mouth daily.   NON FORMULARY CPAP at bedtime   nystatin ointment (MYCOSTATIN) Apply 1 application. topically 2 (two) times daily.   ondansetron (ZOFRAN) 4 MG tablet  Take 1 tablet (4 mg total) by mouth every 8 (eight) hours as needed for nausea or vomiting.   oxyCODONE-acetaminophen (PERCOCET) 10-325 MG tablet Take 1 tablet by mouth every 8 (eight) hours as needed for pain.   promethazine (PHENERGAN) 12.5 MG tablet Take 1 tablet (12.5 mg total) by mouth every 8 (eight) hours as needed for nausea or vomiting.   [DISCONTINUED] amLODipine (NORVASC) 10 MG tablet TAKE 1 TABLET(10 MG) BY MOUTH DAILY   [DISCONTINUED] cetirizine (ZYRTEC) 10 MG  tablet TAKE 1 TABLET BY MOUTH DAILY AS NEEDED FOR ALLERGIES   [DISCONTINUED] promethazine (PHENERGAN) 25 MG suppository Place 25 mg rectally every 6 (six) hours as needed for nausea or vomiting.   amLODipine (NORVASC) 10 MG tablet TAKE 1 TABLET(10 MG) BY MOUTH DAILY   cetirizine (ZYRTEC) 10 MG tablet TAKE 1 TABLET BY MOUTH DAILY AS NEEDED FOR ALLERGIES   Semaglutide,0.25 or 0.5MG /DOS, (OZEMPIC, 0.25 OR 0.5 MG/DOSE,) 2 MG/3ML SOPN INJECT 0.5 MG INTO THE SKIN ONE TIME PER WEEK (Patient not taking: Reported on 11/13/2022)   sucralfate (CARAFATE) 1 g tablet Take 1 tablet (1 g total) by mouth 4 (four) times daily as needed. (Patient not taking: Reported on 03/04/2022)   Vitamin D, Ergocalciferol, (DRISDOL) 1.25 MG (50000 UNIT) CAPS capsule Take 1 capsule (50,000 Units total) by mouth every 7 (seven) days.   [DISCONTINUED] Vitamin D, Ergocalciferol, (DRISDOL) 1.25 MG (50000 UNIT) CAPS capsule Take 1 capsule (50,000 Units total) by mouth every 7 (seven) days. (Patient not taking: Reported on 08/12/2022)   No facility-administered encounter medications on file as of 11/13/2022.    Review of Systems  Review of Systems  Constitutional: Negative.   HENT: Negative.    Cardiovascular: Negative.   Gastrointestinal: Negative.   Allergic/Immunologic: Negative.   Neurological: Negative.   Psychiatric/Behavioral: Negative.       Objective:   BP (!) 147/76   Pulse 88   Temp (!) 97.3 F (36.3 C)   Wt 252 lb (114.3 kg)   LMP 08/24/2015 Comment: irregular periods per pt 09/12/15  SpO2 95%   BMI 39.47 kg/m   Wt Readings from Last 5 Encounters:  11/13/22 252 lb (114.3 kg)  08/12/22 249 lb (112.9 kg)  05/03/22 242 lb (109.8 kg)  03/04/22 244 lb (110.7 kg)  12/22/21 248 lb 7.3 oz (112.7 kg)     Physical Exam Vitals and nursing note reviewed.  Constitutional:      General: She is not in acute distress.    Appearance: She is well-developed.  Cardiovascular:     Rate and Rhythm: Normal rate and  regular rhythm.  Pulmonary:     Effort: Pulmonary effort is normal.     Breath sounds: Normal breath sounds.  Neurological:     Mental Status: She is alert and oriented to person, place, and time.       Assessment & Plan:   Hidradenitis suppurativa -     Ambulatory referral to Dermatology  Chronic right-sided low back pain with right-sided sciatica -     Ambulatory referral to Physical Therapy  Class 2 severe obesity with serious comorbidity and body mass index (BMI) of 37.0 to 37.9 in adult, unspecified obesity type (HCC) -     Microalbumin / creatinine urine ratio -     POCT glycosylated hemoglobin (Hb A1C) -     AMB Referral to Pharmacy Medication Management  Vitamin D deficiency -     Vitamin D (Ergocalciferol); Take 1 capsule (50,000 Units total) by mouth every  7 (seven) days.  Dispense: 5 capsule; Refill: 6  Allergic rhinitis, unspecified seasonality, unspecified trigger -     Cetirizine HCl; TAKE 1 TABLET BY MOUTH DAILY AS NEEDED FOR ALLERGIES  Dispense: 30 tablet; Refill: 2  Essential hypertension -     amLODIPine Besylate; TAKE 1 TABLET(10 MG) BY MOUTH DAILY  Dispense: 90 tablet; Refill: 0  Other orders -     Promethazine HCl; Take 1 tablet (12.5 mg total) by mouth every 8 (eight) hours as needed for nausea or vomiting.  Dispense: 20 tablet; Refill: 0     Return in about 3 months (around 02/13/2023).   Ivonne Andrew, NP 11/13/2022

## 2022-11-13 NOTE — Patient Instructions (Signed)
1. Hidradenitis suppurativa  - Ambulatory referral to Dermatology  2. Chronic right-sided low back pain with right-sided sciatica  - Ambulatory referral to Physical Therapy  3. Class 2 severe obesity with serious comorbidity and body mass index (BMI) of 37.0 to 37.9 in adult, unspecified obesity type (HCC)  - Microalbumin/Creatinine Ratio, Urine - POCT glycosylated hemoglobin (Hb A1C)  4. Vitamin D deficiency  - Vitamin D, Ergocalciferol, (DRISDOL) 1.25 MG (50000 UNIT) CAPS capsule; Take 1 capsule (50,000 Units total) by mouth every 7 (seven) days.  Dispense: 5 capsule; Refill: 6  5. Allergic rhinitis, unspecified seasonality, unspecified trigger  - cetirizine (ZYRTEC) 10 MG tablet; TAKE 1 TABLET BY MOUTH DAILY AS NEEDED FOR ALLERGIES  Dispense: 30 tablet; Refill: 2  6. Essential hypertension  - amLODipine (NORVASC) 10 MG tablet; TAKE 1 TABLET(10 MG) BY MOUTH DAILY  Dispense: 90 tablet; Refill: 0  Follow up:  Follow up in 3 months

## 2022-11-14 ENCOUNTER — Telehealth: Payer: Self-pay

## 2022-11-14 NOTE — Progress Notes (Signed)
Care Guide Note  11/14/2022 Name: Crystal Bullock MRN: [id number] DOB: May 17, 1965  Referred by: Ivonne Andrew, NP Reason for referral : Care Coordination (Outreach to schedule with Pharm d )   Crystal Bullock is a 57 y.o. year old female who is a primary care patient of Ivonne Andrew, NP. Crystal Bullock was referred to the pharmacist for assistance related to DM.    An unsuccessful telephone outreach was attempted today to contact the patient who was referred to the pharmacy team for assistance with medication management. Additional attempts will be made to contact the patient.   Crystal Bullock, Crystal Bullock Care Guide [facility]  [place], [place] [address] Direct Dial: [phone] [email or web address]

## 2022-11-18 NOTE — Progress Notes (Signed)
Care Guide Note  11/18/2022 Name: Crystal Bullock MRN: [id number] DOB: 1965-06-13  Referred by: Crystal Andrew, NP Reason for referral : Care Coordination (Outreach to schedule with Pharm d )   Crystal Bullock is a 57 y.o. year old female who is a primary care patient of Crystal Andrew, NP. Crystal Bullock was referred to the pharmacist for assistance related to DM.    A second unsuccessful telephone outreach was attempted today to contact the patient who was referred to the pharmacy team for assistance with medication management. Additional attempts will be made to contact the patient.  Crystal Bullock, RMA Care Guide [facility]  [place], [place] [address] Direct Dial: [phone] [email or web address]

## 2022-11-19 ENCOUNTER — Encounter: Payer: Self-pay | Admitting: Physical Medicine & Rehabilitation

## 2022-11-21 ENCOUNTER — Other Ambulatory Visit: Payer: Self-pay | Admitting: Nurse Practitioner

## 2022-11-21 DIAGNOSIS — J309 Allergic rhinitis, unspecified: Secondary | ICD-10-CM

## 2022-11-22 NOTE — Progress Notes (Signed)
Care Guide Note  11/22/2022 Name: Crystal Bullock MRN: [id number] DOB: 1965-02-20  Referred by: Ivonne Andrew, NP Reason for referral : Care Coordination (Outreach to schedule with Pharm d )   Crystal Bullock is a 57 y.o. year old female who is a primary care patient of Ivonne Andrew, NP. Crystal Bullock was referred to the pharmacist for assistance related to DM.    A third unsuccessful telephone outreach was attempted today to contact the patient who was referred to the pharmacy team for assistance with medication management. The Population Health team is pleased to engage with this patient at any time in the future upon receipt of referral and should he/she be interested in assistance from the [facility] team.   Penne Lash, RMA Care Guide [facility]  [place], [place] [address] Direct Dial: [phone] [email or web address]

## 2022-11-27 ENCOUNTER — Other Ambulatory Visit: Payer: Self-pay

## 2022-11-27 DIAGNOSIS — I10 Essential (primary) hypertension: Secondary | ICD-10-CM

## 2022-11-27 MED ORDER — PROMETHAZINE HCL 12.5 MG PO TABS: 12.5000 mg | ORAL_TABLET | Freq: Three times a day (TID) | ORAL | 0 refills | Status: DC | PRN

## 2022-11-27 MED ORDER — AMLODIPINE BESYLATE 10 MG PO TABS: ORAL_TABLET | ORAL | 0 refills | Status: DC

## 2022-11-27 MED ORDER — AMOXICILLIN-POT CLAVULANATE 875-125 MG PO TABS: 1.0000 | ORAL_TABLET | Freq: Two times a day (BID) | ORAL | 0 refills | Status: DC

## 2022-11-27 NOTE — Telephone Encounter (Signed)
Please advise Kh 

## 2022-12-02 ENCOUNTER — Ambulatory Visit: Payer: 59 | Attending: Nurse Practitioner

## 2022-12-02 ENCOUNTER — Other Ambulatory Visit: Payer: Self-pay

## 2022-12-02 DIAGNOSIS — M6281 Muscle weakness (generalized): Secondary | ICD-10-CM

## 2022-12-02 DIAGNOSIS — M5459 Other low back pain: Secondary | ICD-10-CM | POA: Diagnosis present

## 2022-12-02 DIAGNOSIS — M25511 Pain in right shoulder: Secondary | ICD-10-CM | POA: Diagnosis present

## 2022-12-02 DIAGNOSIS — M5441 Lumbago with sciatica, right side: Secondary | ICD-10-CM | POA: Diagnosis not present

## 2022-12-02 DIAGNOSIS — G8929 Other chronic pain: Secondary | ICD-10-CM | POA: Diagnosis present

## 2022-12-02 DIAGNOSIS — R2689 Other abnormalities of gait and mobility: Secondary | ICD-10-CM

## 2022-12-02 LAB — BASIC METABOLIC PANEL: Creatinine: 0.9 (ref 0.5–1.1)

## 2022-12-02 LAB — COMPREHENSIVE METABOLIC PANEL: eGFR: 1.73
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
## 2022-12-05 ENCOUNTER — Encounter: Payer: Self-pay | Admitting: Physical Medicine & Rehabilitation

## 2022-12-05 ENCOUNTER — Encounter: Payer: 59 | Attending: Physical Medicine & Rehabilitation | Admitting: Physical Medicine & Rehabilitation

## 2022-12-05 VITALS — Ht 67.0 in | Wt 250.0 lb

## 2022-12-05 DIAGNOSIS — G8929 Other chronic pain: Secondary | ICD-10-CM | POA: Diagnosis present

## 2022-12-05 DIAGNOSIS — Z5181 Encounter for therapeutic drug level monitoring: Secondary | ICD-10-CM | POA: Insufficient documentation

## 2022-12-05 DIAGNOSIS — Z79891 Long term (current) use of opiate analgesic: Secondary | ICD-10-CM | POA: Insufficient documentation

## 2022-12-05 DIAGNOSIS — G894 Chronic pain syndrome: Secondary | ICD-10-CM | POA: Insufficient documentation

## 2022-12-05 DIAGNOSIS — M5441 Lumbago with sciatica, right side: Secondary | ICD-10-CM | POA: Diagnosis not present
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
## 2022-12-10 ENCOUNTER — Ambulatory Visit: Payer: 59

## 2022-12-10 ENCOUNTER — Telehealth: Payer: Self-pay

## 2022-12-10 ENCOUNTER — Ambulatory Visit: Payer: Medicare Other | Admitting: Dermatology

## 2022-12-10 NOTE — Telephone Encounter (Signed)
PT called and left a voicemail message regarding missed appointment.  Left reminder of attendance policy and next session.  [name]   12/10/22 9:11 AM

## 2022-12-11 LAB — TOXASSURE SELECT,+ANTIDEPR,UR

## 2022-12-13 ENCOUNTER — Emergency Department (HOSPITAL_COMMUNITY): Payer: 59

## 2022-12-13 ENCOUNTER — Encounter (HOSPITAL_COMMUNITY): Payer: Self-pay

## 2022-12-13 ENCOUNTER — Emergency Department (HOSPITAL_COMMUNITY)
Admission: EM | Admit: 2022-12-13 | Discharge: 2022-12-13 | Disposition: A | Discharge status: Home / Self Care | Payer: 59 | Attending: Emergency Medicine | Admitting: Emergency Medicine

## 2022-12-13 DIAGNOSIS — R0789 Other chest pain: Secondary | ICD-10-CM | POA: Diagnosis not present

## 2022-12-13 DIAGNOSIS — J45909 Unspecified asthma, uncomplicated: Secondary | ICD-10-CM | POA: Diagnosis not present

## 2022-12-13 DIAGNOSIS — Z79899 Other long term (current) drug therapy: Secondary | ICD-10-CM | POA: Diagnosis not present

## 2022-12-13 DIAGNOSIS — I10 Essential (primary) hypertension: Secondary | ICD-10-CM | POA: Diagnosis not present

## 2022-12-13 DIAGNOSIS — R1032 Left lower quadrant pain: Secondary | ICD-10-CM | POA: Diagnosis present

## 2022-12-13 DIAGNOSIS — N281 Cyst of kidney, acquired: Secondary | ICD-10-CM | POA: Insufficient documentation

## 2022-12-13 DIAGNOSIS — R109 Unspecified abdominal pain: Secondary | ICD-10-CM

## 2022-12-13 LAB — COMPREHENSIVE METABOLIC PANEL
ALT: 22 U/L (ref 0–44)
AST: 20 U/L (ref 15–41)
Albumin: 3.3 g/dL — ABNORMAL LOW (ref 3.5–5.0)
Alkaline Phosphatase: 51 U/L (ref 38–126)
Anion gap: 13 (ref 5–15)
BUN: 16 mg/dL (ref 6–20)
CO2: 28 mmol/L (ref 22–32)
Calcium: 9.5 mg/dL (ref 8.9–10.3)
Chloride: 97 mmol/L — ABNORMAL LOW (ref 98–111)
Creatinine, Ser: 0.97 mg/dL (ref 0.44–1.00)
GFR, Estimated: >60 mL/min (ref >60)
Glucose, Bld: 146 mg/dL — ABNORMAL HIGH (ref 70–99)
Potassium: 3 mmol/L — ABNORMAL LOW (ref 3.5–5.1)
Sodium: 138 mmol/L (ref 135–145)
Total Bilirubin: 0.5 mg/dL (ref 0.3–1.2)
Total Protein: 7.3 g/dL (ref 6.5–8.1)

## 2022-12-13 LAB — CBC WITH DIFFERENTIAL/PLATELET
Abs Immature Granulocytes: 0.03 10*3/uL (ref 0.00–0.07)
Basophils Absolute: 0.1 10*3/uL (ref 0.0–0.1)
Basophils Relative: 1 %
Eosinophils Absolute: 0.4 10*3/uL (ref 0.0–0.5)
Eosinophils Relative: 4 %
HCT: 35.8 % — ABNORMAL LOW (ref 36.0–46.0)
Hemoglobin: 12.2 g/dL (ref 12.0–15.0)
Immature Granulocytes: 0 %
Lymphocytes Relative: 34 %
Lymphs Abs: 2.9 10*3/uL (ref 0.7–4.0)
MCH: 24.4 pg — ABNORMAL LOW (ref 26.0–34.0)
MCHC: 34.1 g/dL (ref 30.0–36.0)
MCV: 71.5 fL — ABNORMAL LOW (ref 80.0–100.0)
Monocytes Absolute: 0.6 10*3/uL (ref 0.1–1.0)
Monocytes Relative: 7 %
Neutro Abs: 4.7 10*3/uL (ref 1.7–7.7)
Neutrophils Relative %: 54 %
Platelets: 270 10*3/uL (ref 150–400)
RBC: 5.01 MIL/uL (ref 3.87–5.11)
RDW: 14.3 % (ref 11.5–15.5)
WBC: 8.6 10*3/uL (ref 4.0–10.5)
nRBC: 0 % (ref 0.0–0.2)

## 2022-12-13 LAB — URINALYSIS, ROUTINE W REFLEX MICROSCOPIC
Bacteria, UA: NONE SEEN
Bilirubin Urine: NEGATIVE
Glucose, UA: NEGATIVE mg/dL
Hgb urine dipstick: NEGATIVE
Ketones, ur: NEGATIVE mg/dL
Nitrite: NEGATIVE
Protein, ur: NEGATIVE mg/dL
Specific Gravity, Urine: 1.017 (ref 1.005–1.030)
pH: 6 (ref 5.0–8.0)

## 2022-12-13 LAB — LIPASE, BLOOD: Lipase: 34 U/L (ref 11–51)

## 2022-12-13 MED ORDER — ONDANSETRON HCL 4 MG/2ML IJ SOLN
4.0000 mg | Freq: Once | INTRAMUSCULAR | Status: AC
  Administered 2022-12-13: 4 mg via INTRAVENOUS
  Filled 2022-12-13: qty 2

## 2022-12-13 MED ORDER — POTASSIUM CHLORIDE CRYS ER 20 MEQ PO TBCR
40.0000 meq | EXTENDED_RELEASE_TABLET | Freq: Once | ORAL | Status: AC
  Administered 2022-12-13: 40 meq via ORAL
  Filled 2022-12-13: qty 2

## 2022-12-13 MED ORDER — MORPHINE SULFATE (PF) 4 MG/ML IV SOLN
4.0000 mg | Freq: Once | INTRAVENOUS | Status: AC
  Administered 2022-12-13: 4 mg via INTRAVENOUS
  Filled 2022-12-13: qty 1

## 2022-12-13 NOTE — Discharge Instructions (Addendum)
The lab tests and the CT scan today were reassuring.  No signs of acute infection, kidney stone or blockage.  Follow-up with your doctor next week to be rechecked if you have any persistent symptoms.  Return to the ED for severe pain fevers or other concerns.  There was an incidental finding noted on your left kidney.  There is a cyst on the left side that they recommend having repeat imaging tests on at some point.  Follow-up with your primary care doctor to have that arranged.

## 2022-12-13 NOTE — ED Triage Notes (Signed)
Left sided abdominal pain X 6 days. Hx of fibroids and diverticulitis with same presentation. Denies nausea, vomiting or diarrhea.

## 2022-12-13 NOTE — ED Provider Notes (Signed)
[place] EMERGENCY DEPARTMENT AT [facility] Provider Note   CSN: [id number] Arrival date & time: 12/13/22  0744     History  Chief Complaint  Patient presents with   Abdominal Pain    Crystal Bullock is a 57 y.o. female.   Abdominal Pain    Patient has a history of fibroids diverticulitis hypertension asthma scoliosis atypical chest pain.  Patient has had prior cholecystectomy.  She presents to the ED with complaints of abdominal pain.  Patient has been having pain in her lower abdomen.'s ongoing for the last 6 days.  Initially patient thought it was related to her fibroid type pain.  However the pain has increased in severity.  It now goes to her lower back buttocks.  She has not had any nausea or vomiting.  No diarrhea.  No dysuria.  She has been feeling constipated.  Home Medications Prior to Admission medications   Medication Sig Start Date End Date Taking? Authorizing Provider  albuterol (PROVENTIL) (2.5 MG/3ML) 0.083% nebulizer solution USE 1 VIAL VIA NEBULIZER EVERY 6 HOURS AS NEEDED FOR WHEEZING OR SHORTNESS OF BREATH 05/09/22   Ivonne Andrew, NP  albuterol (VENTOLIN HFA) 108 (90 Base) MCG/ACT inhaler INHALE 2 PUFFS INTO THE LUNGS EVERY 6 HOURS FOR UP TO 90 DOSES AS NEEDED FOR WHEEZING 08/12/22   Ivonne Andrew, NP  amLODipine (NORVASC) 10 MG tablet TAKE 1 TABLET(10 MG) BY MOUTH DAILY 11/27/22   Ivonne Andrew, NP  amoxicillin-clavulanate (AUGMENTIN) 875-125 MG tablet Take 1 tablet by mouth 2 (two) times daily. Patient not taking: Reported on 12/05/2022 11/27/22   Ivonne Andrew, NP  ascorbic acid (VITAMIN C) 500 MG tablet Take 1 tablet (500 mg total) by mouth daily. 12/24/21   Ivonne Andrew, NP  baclofen (LIORESAL) 10 MG tablet Take 10 mg by mouth 2 (two) times daily as needed for muscle spasms. 03/02/21   [provider]  busPIRone (BUSPAR) 7.5 MG tablet Take 1 tablet (7.5 mg total) by mouth 3 (three) times daily. Patient taking  differently: Take 7.5 mg by mouth 2 (two) times daily as needed (anxiety). 08/24/18   Mike Gip, FNP  cetirizine (ZYRTEC) 10 MG tablet TAKE 1 TABLET BY MOUTH DAILY AS NEEDED FOR ALLERGIES 11/13/22   Ivonne Andrew, NP  clindamycin (CLINDAGEL) 1 % gel Apply topically 2 (two) times daily. 11/23/21   Ivonne Andrew, NP  cyclobenzaprine (FLEXERIL) 10 MG tablet Take 1 tablet (10 mg total) by mouth 3 (three) times daily as needed for muscle spasms. 02/11/21   Fayrene Helper, PA-C  esomeprazole (NEXIUM) 40 MG capsule Take 1 capsule (40 mg total) by mouth daily. 09/26/21 11/13/22  Ivonne Andrew, NP  fluticasone (FLONASE) 50 MCG/ACT nasal spray PLACE 2 SPRAYS INTO BOTH NOSTRILS DAILY AS NEEDED. ALLERGIES 11/21/22   Ivonne Andrew, NP  gabapentin (NEURONTIN) 300 MG capsule Take 300 mg by mouth 3 (three) times daily. 10/28/22   [provider]  hydrochlorothiazide (HYDRODIURIL) 25 MG tablet Take 25 mg by mouth daily. 11/26/22   [provider]  hydrOXYzine (VISTARIL) 50 MG capsule Take as prescribed as AVS. 04/07/19   Barbette Merino, NP  LINZESS 72 MCG capsule Take 1 capsule (72 mcg total) by mouth daily. 10/22/21   Ivonne Andrew, NP  meloxicam (MOBIC) 15 MG tablet Take 15 mg by mouth daily. 04/26/22   [provider]  methocarbamol (ROBAXIN) 500 MG tablet Take 1 tablet (500 mg total) by mouth every  6 (six) hours as needed for muscle spasms. 09/13/21   Arby Barrette, MD  Multiple Vitamins-Minerals (MULTIVITAMINS THER. W/MINERALS) TABS tablet Take 1 tablet by mouth daily. 12/24/21   Ivonne Andrew, NP  NON FORMULARY CPAP at bedtime    [provider]  nystatin ointment (MYCOSTATIN) Apply 1 application. topically 2 (two) times daily. 07/03/21   Passmore, Enid Derry I, NP  ondansetron (ZOFRAN) 4 MG tablet Take 1 tablet (4 mg total) by mouth every 8 (eight) hours as needed for nausea or vomiting. 09/04/22   Ivonne Andrew, NP  oxyCODONE-acetaminophen (PERCOCET) 10-325 MG  tablet Take 1 tablet by mouth every 8 (eight) hours as needed for pain.    [provider]  promethazine (PHENERGAN) 12.5 MG tablet Take 1 tablet (12.5 mg total) by mouth every 8 (eight) hours as needed for nausea or vomiting. 11/27/22   Ivonne Andrew, NP  Semaglutide,0.25 or 0.5MG /DOS, (OZEMPIC, 0.25 OR 0.5 MG/DOSE,) 2 MG/3ML SOPN INJECT 0.5 MG INTO THE SKIN ONE TIME PER WEEK Patient not taking: Reported on 11/13/2022 10/09/22   Ivonne Andrew, NP  sucralfate (CARAFATE) 1 g tablet Take 1 tablet (1 g total) by mouth 4 (four) times daily as needed. Patient not taking: Reported on 03/04/2022 07/22/21   Sabas Sous, MD  Vitamin D, Ergocalciferol, (DRISDOL) 1.25 MG (50000 UNIT) CAPS capsule Take 1 capsule (50,000 Units total) by mouth every 7 (seven) days. 11/13/22   Ivonne Andrew, NP      Allergies    Bee venom and Iodinated contrast media    Review of Systems   Review of Systems  Gastrointestinal:  Positive for abdominal pain.    Physical Exam Updated Vital Signs BP 124/78   Pulse 73   Temp 97.9 F (36.6 C) (Oral)   Resp 10   Ht 1.702 m (5\' 7" )   Wt 113.4 kg   LMP 08/24/2015 Comment: irregular periods per pt 09/12/15  SpO2 96%   BMI 39.16 kg/m  Physical Exam Vitals and nursing note reviewed.  Constitutional:      Appearance: She is well-developed. She is not diaphoretic.  HENT:     Head: Normocephalic and atraumatic.     Right Ear: External ear normal.     Left Ear: External ear normal.  Eyes:     General: No scleral icterus.       Right eye: No discharge.        Left eye: No discharge.     Conjunctiva/sclera: Conjunctivae normal.  Neck:     Trachea: No tracheal deviation.  Cardiovascular:     Rate and Rhythm: Normal rate and regular rhythm.  Pulmonary:     Effort: Pulmonary effort is normal. No respiratory distress.     Breath sounds: Normal breath sounds. No stridor. No wheezing or rales.  Abdominal:     General: Bowel sounds are normal. There is no  distension.     Palpations: Abdomen is soft.     Tenderness: There is abdominal tenderness in the left lower quadrant. There is guarding. There is no rebound.  Musculoskeletal:        General: No tenderness or deformity.     Cervical back: Neck supple.  Skin:    General: Skin is warm and dry.     Findings: No rash.  Neurological:     General: No focal deficit present.     Mental Status: She is alert.     Cranial Nerves: No cranial nerve deficit, dysarthria or  facial asymmetry.     Sensory: No sensory deficit.     Motor: No abnormal muscle tone or seizure activity.     Coordination: Coordination normal.  Psychiatric:        Mood and Affect: Mood normal.     ED Results / Procedures / Treatments   Labs (all labs ordered are listed, but only abnormal results are displayed) Labs Reviewed  COMPREHENSIVE METABOLIC PANEL - Abnormal; Notable for the following components:      Result Value   Potassium 3.0 (*)    Chloride 97 (*)    Glucose, Bld 146 (*)    Albumin 3.3 (*)    All other components within normal limits  CBC WITH DIFFERENTIAL/PLATELET - Abnormal; Notable for the following components:   HCT 35.8 (*)    MCV 71.5 (*)    MCH 24.4 (*)    All other components within normal limits  URINALYSIS, ROUTINE W REFLEX MICROSCOPIC - Abnormal; Notable for the following components:   APPearance HAZY (*)    Leukocytes,Ua TRACE (*)    All other components within normal limits  LIPASE, BLOOD    EKG None  Radiology CT ABDOMEN PELVIS WO CONTRAST  Result Date: 12/13/2022 CLINICAL DATA:  Left lower quadrant abdominal pain for 6 days. Patient has a history of previous episodes of diverticulitis. Fibroids. EXAM: CT ABDOMEN AND PELVIS WITHOUT CONTRAST TECHNIQUE: Multidetector CT imaging of the abdomen and pelvis was performed following the standard protocol without IV contrast. RADIATION DOSE REDUCTION: This exam was performed according to the departmental dose-optimization program which  includes automated exposure control, adjustment of the mA and/or kV according to patient size and/or use of iterative reconstruction technique. COMPARISON:  CT 12/22/2021.  Older exams as well. FINDINGS: Lower chest: Linear opacity lung bases likely scar or atelectasis. No pleural effusion. Hepatobiliary: Diffuse fatty liver infiltration. Slightly nodular contour. Previous cholecystectomy. Pancreas: Unremarkable. No pancreatic ductal dilatation or surrounding inflammatory changes. Spleen: Normal in size without focal abnormality. Adrenals/Urinary Tract: Adrenal glands are preserved. Kidneys once again show several cysts. Example lower pole on the right has Hounsfield unit of -5 and diameter of 25 mm on series 3, image 38. Pole medial focus on the left has a diameter of 3.0 cm and Hounsfield unit of 13. There are some hyperdense areas as well, calcifications including along the aforementioned lesion identified just now and into complexity some of the lesions. Some of the areas of calcification have increased. No renal or ureteral stones. The ureters have normal course and caliber down to the bladder which has a normal course and caliber. Stomach/Bowel: On this non oral contrast exam large bowel has a normal course and caliber with diffuse stool. Few scattered colonic diverticula. Normal appendix. Stomach and small bowel are nondilated. Vascular/Lymphatic: Aortic atherosclerosis. No enlarged abdominal or pelvic lymph nodes. Reproductive: Uterus is present. Bilateral tubal ligation clips are seen. Other: Smaller areas of free fluid dependently in the pelvis on the left near the sigmoid colon. Stomach and small bowel are nondilated. Musculoskeletal: Mild degenerative changes along the spine. Trace retrolisthesis of L4 on L5. There is multilevel disc bulging. IMPRESSION: No bowel obstruction, free air. Scattered colonic diverticula. Normal appendix. Trace free fluid in the pelvis, nonspecific. Multiple bilateral renal  cystic foci. There is 1 on the left which has a increasing calcification in the upper pole. Would recommend a follow up postcontrast study to confirm benign cyst when clinically appropriate. Electronically Signed   By: Karen Kays M.D.   On:  12/13/2022 10:23    Procedures Procedures    Medications Ordered in ED Medications  morphine (PF) 4 MG/ML injection 4 mg (4 mg Intravenous Given 12/13/22 0823)  ondansetron (ZOFRAN) injection 4 mg (4 mg Intravenous Given 12/13/22 0823)  potassium chloride SA (KLOR-CON M) CR tablet 40 mEq (40 mEq Oral Given 12/13/22 1000)    ED Course/ Medical Decision Making/ A&P Clinical Course as of 12/13/22 1139  Fri Dec 13, 2022  0918 CBC normal.  Metabolic panel shows hypokalemia [JK]  1128 Analysis without signs of infection.  CT scan without acute abnormalities [JK]    Clinical Course User Index [JK] Linwood Dibbles, MD                                 Medical Decision Making Problems Addressed: Abdominal pain, unspecified abdominal location: acute illness or injury that poses a threat to life or bodily functions  Amount and/or Complexity of Data Reviewed Labs: ordered. Decision-making details documented in ED Course. Radiology: ordered and independent interpretation performed.  Risk Prescription drug management.   Patient presented to ED with complaints of lower abdominal pain.  Was concerned about the possibility of diverticulitis, colitis ureterolithiasis, pyelonephritis.  ED workup reassuring.  No significant laboratory abnormalities.  No findings to suggest hepatitis or pancreatitis.  Urinalysis not suggestive of infection.  CT scan was performed and it does not show any evidence of diverticulitis colitis ureterolithiasis or other acute abnormality.        Final Clinical Impression(s) / ED Diagnoses Final diagnoses:  Abdominal pain, unspecified abdominal location  Renal cyst    Rx / DC Orders ED Discharge Orders     None          Linwood Dibbles, MD 12/13/22 1139

## 2022-12-18 NOTE — Therapy (Signed)
OUTPATIENT PHYSICAL THERAPY TREATMENT   Patient Name: Crystal Bullock MRN: [id number] DOB:[birth year], 57 y.o., female Today's Date: 12/19/2022   END OF SESSION:  PT End of Session - 12/19/22 1116     Visit Number 2    Number of Visits 17    Date for PT Re-Evaluation 01/27/23    Authorization Type [employer] MCR    PT Start Time 1105    PT Stop Time 1145    PT Time Calculation (min) 40 min    Activity Tolerance Patient tolerated treatment well    Behavior During Therapy WFL for tasks assessed/performed              Past Medical History:  Diagnosis Date   Anxiety 10/06/2015   Asthma    Atypical chest pain 10/06/2015   Chronic neck pain    Fibroid    Hypertension    Palpitations 10/06/2015   RBBB 10/06/2015   Sarcoidosis    Scoliosis    Seasonal allergies    Sickle cell trait (HCC)    Vitamin D deficiency 05/2019   Past Surgical History:  Procedure Laterality Date   AXILLARY LYMPH NODE DISSECTION     CHOLECYSTECTOMY     Patient Active Problem List   Diagnosis Date Noted   Vitamin D deficiency 03/04/2022   Diverticulitis 12/22/2021   Lumbar radiculopathy 09/26/2021   Class 2 severe obesity with serious comorbidity and body mass index (BMI) of 37.0 to 37.9 in adult, unspecified obesity type (HCC) 03/19/2021   Sickle cell trait (HCC) 03/19/2021   Neck pain, chronic 06/09/2019   Tearfulness 02/22/2019   Prediabetes 02/22/2019   Hyperglycemia 02/22/2019   Hemoglobin A1c less than 7.0% 02/22/2019   Yeast infection 11/11/2018   GERD (gastroesophageal reflux disease) 08/19/2018   Mild tetrahydrocannabinol (THC) abuse 08/19/2018   Hidradenitis suppurativa 06/05/2018   Allergic rhinitis 05/08/2018   OSA (obstructive sleep apnea) 05/13/2017   Low back pain 02/24/2017   Sarcoidosis 10/06/2015   Essential hypertension 10/06/2015   Asthma 10/06/2015   Atypical chest pain 10/06/2015   Anxiety 10/06/2015   Palpitations 10/06/2015   RBBB 10/06/2015    PCP: Ivonne Andrew, NP  REFERRING PROVIDER: Ivonne Andrew, NP  REFERRING DIAG: [phone] (ICD-10-CM) - Chronic right-sided low back pain with right-sided sciatica   Rationale for Evaluation and Treatment: Rehabilitation  THERAPY DIAG:  Other low back pain  Chronic right shoulder pain  Other abnormalities of gait and mobility  Muscle weakness (generalized)  ONSET DATE: Chronic   SUBJECTIVE:  SUBJECTIVE STATEMENT: Patient reports she is feeling a little achy in the lower back. She hasn't really had a problem with her right shoulder since last visit. The only time she has a problem with the shoulder is when she tries to hold it up high or sleeps on it. She reports new onset of left lower back and hip pain since she was examined by her new doctor.  PERTINENT HISTORY:  HTN, Sarcoidosis  PAIN:  Are you having pain?  Yes: NPRS scale: 5/10 Worst: 10/10 Pain location: lower back, R LE to feet Pain description: sharp, N/T Aggravating factors: prolonged sitting, lying down, walking long periods Relieving factors: movement, medication  Are you having pain?  Yes: NPRS scale: 5/10 Worst: 10/10 Pain location: R shoulder Pain description: sharp, occasional  Aggravating factors: reaching overhead, driving Relieving factors: heat, medications  PRECAUTIONS: None  RED FLAGS: None   WEIGHT BEARING RESTRICTIONS: No  FALLS:  Has patient fallen in last 6 months? No  LIVING ENVIRONMENT: Lives with: lives with their family Lives in: House/apartment Stairs: Yes: External: 23 steps; bilateral but cannot reach both Has following equipment at home: Single point cane  PATIENT GOALS: decrease pain and improving walking distance  NEXT MD VISIT: 02/23/2022   OBJECTIVE:  Note: Objective measures were completed  at Evaluation unless otherwise noted. PATIENT SURVEYS:  FOTO (lumbar): 33% function; 49% predicted (shoulder): 53% function; 64% predicted   SENSATION: Light touch: Impaired - decreased to L lateral LE  MUSCLE LENGTH: Hamstrings: Right WFL; Left WFL Thomas test: Right (+); Left (+)  POSTURE: rounded shoulders, forward head, and increased lumbar lordosis  PALPATION: TTP to L upper trap, L infraspinauts  UPPER EXTREMITY ROM:     MMT Right eval Left eval Right 12/19/2022  Shoulder flexion 78 WFL 140  Shoulder extension     Shoulder abduction 55 WFL   Shoulder adduction     Shoulder internal rotation 20 WFL   Shoulder external rotation 35 WFL    (Blank rows = not tested)  LOWER EXTREMITY MMT:    MMT Right eval Left eval  Hip flexion 4/5 5/5  Hip extension    Hip abduction 4/5 5/5  Hip adduction    Hip internal rotation    Hip external rotation    Knee flexion 5/5 5/5  Knee extension 5/5 5/5  Ankle dorsiflexion    Ankle plantarflexion    Ankle inversion    Ankle eversion     (Blank rows = not tested)  LUMBAR SPECIAL TESTS:  Straight leg raise test: Positive and Slump test: Positive  FUNCTIONAL TESTS:  30 Second Sit to Stand: 8 reps  GAIT: Distance walked: 29ft Assistive device utilized: None Level of assistance: Complete Independence Comments: antalgic gait to R   TREATMENT: [facility] Adult PT Treatment:                                                DATE: 12/19/2022 Therapeutic Exercise: NuStep L4 x 5 min with UE/LE while taking subjective LTR x 10 Piriformis stretch 2 x 20 sec each Supine PPT 10 x 5 sec Supine clamshell unilateral with red 2 x 10 each Supine shoulder press with dowel to overhead reach 2 x 5 Side clamshell with left 2 x 10 Seated hamstring stretch 2 x 20 sec each Seated shoulder blade squeeze 10 x 5 sec Manual: STM  using FR to left gluteal region   [facility] Adult PT Treatment:                                                DATE:  12/02/2022 Therapeutic Exercise: R upper trap stretch x 30" R R shoulder ER iso x 5 - 5" hold Seated sciatic nerve glide x 10 R Supine PPT x 5 - 5" hold Supine clamshell x 10 GTB  PATIENT EDUCATION:  Education details: HEP update Person educated: Patient Education method: Explanation, Demonstration, Handout Education comprehension: verbalized understanding and returned demonstration  HOME EXERCISE PROGRAM: Access Code: [email or web address]   ASSESSMENT: CLINICAL IMPRESSION: Patient tolerated therapy well with no adverse effects. She did report new onset of left lower back and hip pain since seeing her doctor on 12/05/2022. Therapy focused on progressing lumbar and hip mobility, core and proximal hip muscle strength, and shoulder motion. She does exhibit an improvement in her right shoulder motion this visit and currently report her symptoms are primarily on the left lower back and hip. Updated HEP to incorporate some stretching for the hips. Patient would benefit from continued skilled PT to progress her mobility and strength in order to reduce pain and maximize functional ability.   EVAL: Patient is a 57 y.o. F who was seen today for physical therapy evaluation and treatment for chronic LBP with referral into R LE. Physical findings are consistent with referring provider impression as pt demonstrates decrease in core/hip strength and overall functional mobility. FOTO score demonstrates decrease in subjective functional ability below PLOF. Pt would benefit from skilled PT services working on decreasing pain and improving comfort.   OBJECTIVE IMPAIRMENTS: Abnormal gait, decreased activity tolerance, decreased balance, decreased mobility, difficulty walking, decreased ROM, decreased strength, impaired UE functional use, and pain  ACTIVITY LIMITATIONS: carrying, lifting, sitting, standing, squatting, sleeping, stairs, transfers, reach over head, and locomotion level  PARTICIPATION LIMITATIONS: meal  prep, cleaning, driving, shopping, community activity, and occupation  PERSONAL FACTORS: Time since onset of injury/illness/exacerbation and 1-2 comorbidities: HTN, Sarcoidosis  are also affecting patient's functional outcome.    GOALS: Goals reviewed with patient? No  SHORT TERM GOALS: Target date: 12/23/2022   Pt will be compliant and knowledgeable with initial HEP for improved comfort and carryover Baseline: initial HEP given  Goal status: INITIAL  2.  Pt will self report lower back and R shoulder pain no greater than 6/10 for improved comfort and functional ability Baseline: 10/10 at worst Goal status: INITIAL   LONG TERM GOALS: Target date: 01/27/2023   Pt will improve shoulder FOTO function score to no less than 64% as proxy for functional improvement Baseline: 53% function Goal status: INITIAL   2.  Pt will self report lower back and R shoulder pain no greater than 3/10 for improved comfort and functional ability Baseline: 10/10 at worst Goal status: INITIAL   3.  Pt will increase 30 Second Sit to Stand rep count to no less than 10 reps for improved balance, strength, and functional mobility Baseline: 8 reps  Goal status: INITIAL   4.  Pt will improve lumbar FOTO function score to no less than 49% as proxy for functional improvement Baseline: 33% function Goal status: INITIAL   5.  Pt will improve R shoulder flex to no less than 130 degrees for improved comfort and function with overhead reaching into cabinets  Baseline: see ROM chart Goal status: INITIAL  6.  Pt will walking distance time to no less than 20 minutes for improving comfort and functional ability with desired recreational activity Baseline: 5 minutes Goal status: INITIAL   PLAN: PT FREQUENCY: 1-2x/week  PT DURATION: 8 weeks  PLANNED INTERVENTIONS: 97164- PT Re-evaluation, 97110-Therapeutic exercises, 97530- Therapeutic activity, 97112- Neuromuscular re-education, 97535- Self Care, 56213- Manual  therapy, [id number]- Gait training, [id number]- Aquatic Therapy, 97014- Electrical stimulation (unattended), [id number]- Electrical stimulation (manual), 97016- Vasopneumatic device, Dry Needling, Cryotherapy, and Moist heat  PLAN FOR NEXT SESSION: assess HEP response, core/hip strengthening, periscapular/RTC strengthening   Rosana Hoes, PT, DPT, LAT, ATC 12/19/22  11:45 AM Phone: [phone] Fax: [phone]

## 2022-12-19 ENCOUNTER — Encounter: Payer: Self-pay | Admitting: Physical Therapy

## 2022-12-19 ENCOUNTER — Other Ambulatory Visit: Payer: Self-pay

## 2022-12-19 ENCOUNTER — Ambulatory Visit: Payer: 59 | Attending: Nurse Practitioner | Admitting: Physical Therapy

## 2022-12-19 ENCOUNTER — Ambulatory Visit: Payer: Self-pay | Admitting: Nurse Practitioner

## 2022-12-19 ENCOUNTER — Other Ambulatory Visit: Payer: Self-pay | Admitting: Nurse Practitioner

## 2022-12-19 DIAGNOSIS — M6281 Muscle weakness (generalized): Secondary | ICD-10-CM | POA: Insufficient documentation

## 2022-12-19 DIAGNOSIS — M25511 Pain in right shoulder: Secondary | ICD-10-CM | POA: Insufficient documentation

## 2022-12-19 DIAGNOSIS — R2689 Other abnormalities of gait and mobility: Secondary | ICD-10-CM | POA: Diagnosis present

## 2022-12-19 DIAGNOSIS — M5459 Other low back pain: Secondary | ICD-10-CM | POA: Insufficient documentation

## 2022-12-19 DIAGNOSIS — G8929 Other chronic pain: Secondary | ICD-10-CM | POA: Insufficient documentation

## 2022-12-19 MED ORDER — AMOXICILLIN-POT CLAVULANATE 875-125 MG PO TABS: 1.0000 | ORAL_TABLET | Freq: Two times a day (BID) | ORAL | 0 refills | Status: DC

## 2022-12-25 ENCOUNTER — Ambulatory Visit: Payer: 59 | Admitting: Physical Therapy

## 2022-12-25 ENCOUNTER — Telehealth: Payer: Self-pay | Admitting: Physical Therapy

## 2022-12-25 NOTE — Telephone Encounter (Signed)
Spoke with patient regarding missed PT appointment. She stated that she had her appointments mixed up and thought it was scheduled for tomorrow. Patient was reminded of her next scheduled appointment on 01/01/2023 and advised of the attendance policy. Patient expressed understanding.   Rosana Hoes, PT, DPT, LAT, ATC 12/25/22  3:48 PM Phone: [phone] Fax: [phone]

## 2022-12-26 ENCOUNTER — Ambulatory Visit: Payer: Self-pay | Admitting: Nurse Practitioner

## 2022-12-26 ENCOUNTER — Telehealth (INDEPENDENT_AMBULATORY_CARE_PROVIDER_SITE_OTHER): Payer: 59 | Admitting: Nurse Practitioner

## 2022-12-26 ENCOUNTER — Encounter: Payer: Self-pay | Admitting: Nurse Practitioner

## 2022-12-26 ENCOUNTER — Encounter: Payer: Self-pay | Admitting: Gastroenterology

## 2022-12-26 VITALS — Ht 67.0 in | Wt 250.0 lb

## 2022-12-26 DIAGNOSIS — R1032 Left lower quadrant pain: Secondary | ICD-10-CM

## 2022-12-26 DIAGNOSIS — K651 Peritoneal abscess: Secondary | ICD-10-CM | POA: Diagnosis not present

## 2022-12-26 MED ORDER — DOCUSATE SODIUM 100 MG PO CAPS: 100.0000 mg | ORAL_CAPSULE | Freq: Two times a day (BID) | ORAL | 0 refills | Status: DC

## 2022-12-26 MED ORDER — AMOXICILLIN-POT CLAVULANATE 875-125 MG PO TABS: 1.0000 | ORAL_TABLET | Freq: Two times a day (BID) | ORAL | 0 refills | Status: DC

## 2022-12-26 NOTE — Progress Notes (Signed)
Virtual Visit via Video Note  I connected with Crystal Bullock on 12/26/22 at 11:20 AM EST by a video enabled telemedicine application and verified that I am speaking with the correct person using two identifiers.  Location: Patient: home Provider: office   I discussed the limitations of evaluation and management by telemedicine and the availability of in person appointments. The patient expressed understanding and agreed to proceed.  History of Present Illness:  Patient presents today for an acute visit through video visit.  She states for the past couple weeks she has been having left lower quadrant abdominal pain.  She states that her last bowel movement was yesterday.  She has not noticed any blood in her stools.  She denies any vomiting.  She denies any significant fever.  She is try to stay well-hydrated.  We did discuss bland diet.  Patient did go to the emergency room on 12/13/2022 and overall CT scan was negative.  We will place urgent referral for patient for GI.  We will treat with Augmentin and stool softener today.  Concern for diverticulitis. Denies f/c/s, n/v/d, hemoptysis, PND, leg swelling Denies chest pain or edema     Observations/Objective:     12/26/2022   11:06 AM 12/13/2022   12:07 PM 12/13/2022    9:30 AM  Vitals with BMI  Height 5\' 7"     Weight 250 lbs    BMI 39.15    Systolic  122 124  Diastolic  71 78  Pulse  71 73      Assessment and Plan:  1. Left lower quadrant abdominal abscess (HCC)  - amoxicillin-clavulanate (AUGMENTIN) 875-125 MG tablet; Take 1 tablet by mouth 2 (two) times daily.  Dispense: 20 tablet; Refill: 0 - docusate sodium (COLACE) 100 MG capsule; Take 1 capsule (100 mg total) by mouth 2 (two) times daily.  Dispense: 10 capsule; Refill: 0  2. Left lower quadrant abdominal pain  - Ambulatory referral to Gastroenterology  Follow up:  Follow up as needed     I discussed the assessment and treatment plan with the patient. The  patient was provided an opportunity to ask questions and all were answered. The patient agreed with the plan and demonstrated an understanding of the instructions.   The patient was advised to call back or seek an in-person evaluation if the symptoms worsen or if the condition fails to improve as anticipated.  I provided 22 minutes of non-face-to-face time during this encounter.   Ivonne Andrew, NP

## 2022-12-26 NOTE — Patient Instructions (Signed)
1. Left lower quadrant abdominal abscess (HCC)  - amoxicillin-clavulanate (AUGMENTIN) 875-125 MG tablet; Take 1 tablet by mouth 2 (two) times daily.  Dispense: 20 tablet; Refill: 0 - docusate sodium (COLACE) 100 MG capsule; Take 1 capsule (100 mg total) by mouth 2 (two) times daily.  Dispense: 10 capsule; Refill: 0  2. Left lower quadrant abdominal pain  - Ambulatory referral to Gastroenterology  Follow up:  Follow up as needed

## 2022-12-31 ENCOUNTER — Encounter: Payer: 59 | Admitting: Physical Medicine & Rehabilitation

## 2023-01-01 ENCOUNTER — Ambulatory Visit: Payer: 59 | Admitting: Physical Therapy

## 2023-01-01 ENCOUNTER — Encounter: Payer: Self-pay | Admitting: Physical Therapy

## 2023-01-01 ENCOUNTER — Other Ambulatory Visit: Payer: Self-pay

## 2023-01-01 DIAGNOSIS — M5459 Other low back pain: Secondary | ICD-10-CM | POA: Diagnosis not present

## 2023-01-01 DIAGNOSIS — R2689 Other abnormalities of gait and mobility: Secondary | ICD-10-CM

## 2023-01-01 DIAGNOSIS — M6281 Muscle weakness (generalized): Secondary | ICD-10-CM

## 2023-01-01 DIAGNOSIS — G8929 Other chronic pain: Secondary | ICD-10-CM

## 2023-01-01 NOTE — Therapy (Signed)
OUTPATIENT PHYSICAL THERAPY TREATMENT   Patient Name: Crystal Bullock MRN: [id number] DOB:09-06-1965, 57 y.o., female Today's Date: 01/01/2023   END OF SESSION:  PT End of Session - 01/01/23 1110     Visit Number 3    Number of Visits 17    Date for PT Re-Evaluation 01/27/23    Authorization Type [employer] MCR    PT Start Time 1100    PT Stop Time 1140    PT Time Calculation (min) 40 min    Activity Tolerance Patient tolerated treatment well    Behavior During Therapy WFL for tasks assessed/performed               Past Medical History:  Diagnosis Date   Anxiety 10/06/2015   Asthma    Atypical chest pain 10/06/2015   Chronic neck pain    Fibroid    Hypertension    Palpitations 10/06/2015   RBBB 10/06/2015   Sarcoidosis    Scoliosis    Seasonal allergies    Sickle cell trait (HCC)    Vitamin D deficiency 05/2019   Past Surgical History:  Procedure Laterality Date   AXILLARY LYMPH NODE DISSECTION     CHOLECYSTECTOMY     Patient Active Problem List   Diagnosis Date Noted   Vitamin D deficiency 03/04/2022   Diverticulitis 12/22/2021   Lumbar radiculopathy 09/26/2021   Class 2 severe obesity with serious comorbidity and body mass index (BMI) of 37.0 to 37.9 in adult, unspecified obesity type (HCC) 03/19/2021   Sickle cell trait (HCC) 03/19/2021   Neck pain, chronic 06/09/2019   Tearfulness 02/22/2019   Prediabetes 02/22/2019   Hyperglycemia 02/22/2019   Hemoglobin A1c less than 7.0% 02/22/2019   Yeast infection 11/11/2018   GERD (gastroesophageal reflux disease) 08/19/2018   Mild tetrahydrocannabinol (THC) abuse 08/19/2018   Hidradenitis suppurativa 06/05/2018   Allergic rhinitis 05/08/2018   OSA (obstructive sleep apnea) 05/13/2017   Low back pain 02/24/2017   Sarcoidosis 10/06/2015   Essential hypertension 10/06/2015   Asthma 10/06/2015   Atypical chest pain 10/06/2015   Anxiety 10/06/2015   Palpitations 10/06/2015   RBBB 10/06/2015    PCP:  Ivonne Andrew, NP  REFERRING PROVIDER: Ivonne Andrew, NP  REFERRING DIAG: [phone] (ICD-10-CM) - Chronic right-sided low back pain with right-sided sciatica   Rationale for Evaluation and Treatment: Rehabilitation  THERAPY DIAG:  Other low back pain  Chronic right shoulder pain  Other abnormalities of gait and mobility  Muscle weakness (generalized)  ONSET DATE: Chronic   SUBJECTIVE:  SUBJECTIVE STATEMENT: Patient reports she has started having pain again. Pain mainly on the left lower back and hip still, and her right shoulder has started bothering her some again.  PERTINENT HISTORY:  HTN, Sarcoidosis  PAIN:  Are you having pain?  Yes: NPRS scale: 6/10 Worst: 10/10 Pain location: lower back, left hip Pain description: sharp, N/T Aggravating factors: prolonged sitting, lying down, walking long periods Relieving factors: movement, medication  Are you having pain?  Yes: NPRS scale: 5/10 Worst: 10/10 Pain location: R shoulder Pain description: sharp, occasional  Aggravating factors: reaching overhead, driving Relieving factors: heat, medications  PRECAUTIONS: None  RED FLAGS: None   WEIGHT BEARING RESTRICTIONS: No  FALLS:  Has patient fallen in last 6 months? No  LIVING ENVIRONMENT: Lives with: lives with their family Lives in: House/apartment Stairs: Yes: External: 23 steps; bilateral but cannot reach both Has following equipment at home: Single point cane  PATIENT GOALS: decrease pain and improving walking distance  NEXT MD VISIT: 02/23/2022   OBJECTIVE:  Note: Objective measures were completed at Evaluation unless otherwise noted. PATIENT SURVEYS:  FOTO (lumbar): 33% function; 49% predicted (shoulder): 53% function; 64% predicted   SENSATION: Light touch:  Impaired - decreased to L lateral LE  MUSCLE LENGTH: Hamstrings: Right WFL; Left WFL Thomas test: Right (+); Left (+)  POSTURE: rounded shoulders, forward head, and increased lumbar lordosis  PALPATION: TTP to L upper trap, L infraspinauts  UPPER EXTREMITY ROM:     MMT Right eval Left eval Right 12/19/2022  Shoulder flexion 78 WFL 140  Shoulder extension     Shoulder abduction 55 WFL   Shoulder adduction     Shoulder internal rotation 20 WFL   Shoulder external rotation 35 WFL    (Blank rows = not tested)  LOWER EXTREMITY MMT:    MMT Right eval Left eval  Hip flexion 4/5 5/5  Hip extension    Hip abduction 4/5 5/5  Hip adduction    Hip internal rotation    Hip external rotation    Knee flexion 5/5 5/5  Knee extension 5/5 5/5  Ankle dorsiflexion    Ankle plantarflexion    Ankle inversion    Ankle eversion     (Blank rows = not tested)  LUMBAR SPECIAL TESTS:  Straight leg raise test: Positive and Slump test: Positive  FUNCTIONAL TESTS:  30 Second Sit to Stand: 8 reps  01/01/2023: 12 reps  GAIT: Distance walked: 12ft Assistive device utilized: None Level of assistance: Complete Independence Comments: antalgic gait to R   TREATMENT: [facility] Adult PT Treatment:                                                DATE: 01/01/2023 Therapeutic Exercise: NuStep L5 x 6 min with UE/LE while taking subjective Side clamshell with left 2 x 10 Piriformis stretch 2 x 20 sec each LTR 5 x 5 sec each Supine shoulder press with dowel to overhead reach 2 x 10 Supine PPT 10 x 5 sec Bridge partial range 2 x 10 Supine shoulder horizontal abduction with red 2 x 10 SLR with physioball press 2 x 10 each Sit to stand 2 x 10 Manual: STM using FR to left gluteal region   [facility] Adult PT Treatment:  DATE: 12/19/2022 Therapeutic Exercise: NuStep L4 x 5 min with UE/LE while taking subjective LTR x 10 Piriformis stretch 2 x 20 sec  each Supine PPT 10 x 5 sec Supine clamshell unilateral with red 2 x 10 each Supine shoulder press with dowel to overhead reach 2 x 5 Side clamshell with left 2 x 10 Seated hamstring stretch 2 x 20 sec each Seated shoulder blade squeeze 10 x 5 sec Manual: STM using FR to left gluteal region  [facility] Adult PT Treatment:                                                DATE: 12/02/2022 Therapeutic Exercise: R upper trap stretch x 30" R R shoulder ER iso x 5 - 5" hold Seated sciatic nerve glide x 10 R Supine PPT x 5 - 5" hold Supine clamshell x 10 GTB  PATIENT EDUCATION:  Education details: HEP Person educated: Patient Education method: Programmer, multimedia, Demonstration Education comprehension: verbalized understanding and returned demonstration  HOME EXERCISE PROGRAM: Access Code: [email or web address]   ASSESSMENT: CLINICAL IMPRESSION: Patient tolerated therapy well with no adverse effects. Continues with some STM for the left gluteal region with good therapeutic benefit and therapy focused on progressing hip, core, and postural strengthening. She was able to progress with her hip and core strengthening this visit and improved with her 30 sec stand test indicating improved LE strength and mobility. No changes made to HEP this visit. Patient would benefit from continued skilled PT to progress her mobility and strength in order to reduce pain and maximize functional ability.   EVAL: Patient is a 57 y.o. F who was seen today for physical therapy evaluation and treatment for chronic LBP with referral into R LE. Physical findings are consistent with referring provider impression as pt demonstrates decrease in core/hip strength and overall functional mobility. FOTO score demonstrates decrease in subjective functional ability below PLOF. Pt would benefit from skilled PT services working on decreasing pain and improving comfort.   OBJECTIVE IMPAIRMENTS: Abnormal gait, decreased activity tolerance, decreased balance,  decreased mobility, difficulty walking, decreased ROM, decreased strength, impaired UE functional use, and pain  ACTIVITY LIMITATIONS: carrying, lifting, sitting, standing, squatting, sleeping, stairs, transfers, reach over head, and locomotion level  PARTICIPATION LIMITATIONS: meal prep, cleaning, driving, shopping, community activity, and occupation  PERSONAL FACTORS: Time since onset of injury/illness/exacerbation and 1-2 comorbidities: HTN, Sarcoidosis  are also affecting patient's functional outcome.    GOALS: Goals reviewed with patient? No  SHORT TERM GOALS: Target date: 12/23/2022   Pt will be compliant and knowledgeable with initial HEP for improved comfort and carryover Baseline: initial HEP given  01/01/2023: progressing Goal status: ONGOING  2.  Pt will self report lower back and R shoulder pain no greater than 6/10 for improved comfort and functional ability Baseline: 10/10 at worst 01/01/2023: continues to report 10/10 pain Goal status: ONGOING  LONG TERM GOALS: Target date: 01/27/2023   Pt will improve shoulder FOTO function score to no less than 64% as proxy for functional improvement Baseline: 53% function Goal status: INITIAL   2.  Pt will self report lower back and R shoulder pain no greater than 3/10 for improved comfort and functional ability Baseline: 10/10 at worst Goal status: INITIAL   3.  Pt will increase 30 Second Sit to Stand rep count to no less than 10 reps for  improved balance, strength, and functional mobility Baseline: 8 reps  01/01/2023: 12 reps Goal status: MET  4.  Pt will improve lumbar FOTO function score to no less than 49% as proxy for functional improvement Baseline: 33% function Goal status: INITIAL   5.  Pt will improve R shoulder flex to no less than 130 degrees for improved comfort and function with overhead reaching into cabinets Baseline: see ROM chart Goal status: INITIAL  6.  Pt will walking distance time to no less than  20 minutes for improving comfort and functional ability with desired recreational activity Baseline: 5 minutes Goal status: INITIAL   PLAN: PT FREQUENCY: 1-2x/week  PT DURATION: 8 weeks  PLANNED INTERVENTIONS: 97164- PT Re-evaluation, 97110-Therapeutic exercises, 97530- Therapeutic activity, 97112- Neuromuscular re-education, 97535- Self Care, 30865- Manual therapy, [id number]- Gait training, [id number]- Aquatic Therapy, 97014- Electrical stimulation (unattended), [id number]- Electrical stimulation (manual), 97016- Vasopneumatic device, Dry Needling, Cryotherapy, and Moist heat  PLAN FOR NEXT SESSION: assess HEP response, core/hip strengthening, periscapular/RTC strengthening   Rosana Hoes, PT, DPT, LAT, ATC 01/01/23  12:01 PM Phone: [phone] Fax: [phone]

## 2023-01-06 ENCOUNTER — Ambulatory Visit: Payer: Medicare Other | Admitting: Dermatology

## 2023-01-16 ENCOUNTER — Other Ambulatory Visit: Payer: Self-pay | Admitting: Nurse Practitioner

## 2023-01-16 DIAGNOSIS — K651 Peritoneal abscess: Secondary | ICD-10-CM

## 2023-01-16 MED ORDER — DOCUSATE SODIUM 100 MG PO CAPS: 100.0000 mg | ORAL_CAPSULE | Freq: Two times a day (BID) | ORAL | 0 refills | Status: DC

## 2023-01-21 ENCOUNTER — Encounter: Payer: Self-pay | Admitting: Nurse Practitioner

## 2023-01-21 ENCOUNTER — Ambulatory Visit (INDEPENDENT_AMBULATORY_CARE_PROVIDER_SITE_OTHER): Payer: 59 | Admitting: Nurse Practitioner

## 2023-01-21 VITALS — BP 141/88 | HR 95 | Ht 67.0 in | Wt 254.8 lb

## 2023-01-21 DIAGNOSIS — E876 Hypokalemia: Secondary | ICD-10-CM | POA: Diagnosis not present

## 2023-01-21 DIAGNOSIS — D869 Sarcoidosis, unspecified: Secondary | ICD-10-CM

## 2023-01-21 DIAGNOSIS — J4520 Mild intermittent asthma, uncomplicated: Secondary | ICD-10-CM | POA: Diagnosis not present

## 2023-01-21 DIAGNOSIS — J209 Acute bronchitis, unspecified: Secondary | ICD-10-CM | POA: Diagnosis not present

## 2023-01-21 MED ORDER — ALBUTEROL SULFATE (2.5 MG/3ML) 0.083% IN NEBU: INHALATION_SOLUTION | RESPIRATORY_TRACT | 1 refills | Status: DC

## 2023-01-21 MED ORDER — BENZONATATE 200 MG PO CAPS: 200.0000 mg | ORAL_CAPSULE | Freq: Three times a day (TID) | ORAL | 0 refills | Status: DC | PRN

## 2023-01-21 MED ORDER — METHYLPREDNISOLONE 4 MG PO TBPK: ORAL_TABLET | ORAL | 0 refills | Status: DC

## 2023-01-21 MED ORDER — AZITHROMYCIN 250 MG PO TABS: ORAL_TABLET | ORAL | 0 refills | Status: DC

## 2023-01-21 NOTE — Progress Notes (Signed)
Acute Office Visit  Subjective:     Patient ID: Crystal Bullock, female    DOB: Dec 21, 1965, 57 y.o.   MRN: [id number]  Chief Complaint  Patient presents with   Cough    COUGHING AND CHEST PAIN.    HPI Crystal Bullock  has a past medical history of Anxiety (10/06/2015), Asthma, Atypical chest pain (10/06/2015), Chronic neck pain, Fibroid, Hypertension, Palpitations (10/06/2015), RBBB (10/06/2015), Sarcoidosis, Scoliosis, Seasonal allergies, Sickle cell trait (HCC), and Vitamin D deficiency (05/2019).  Patient presents with complaints of cough, wheezing, shortness of breath, malaise ongoing for the past 2 weeks. Stated that her chest hurts when she cough, cough was nonproductive until yesterday. stated that 2 of her grandsons and her sister involved recently had pneumonia.  She has been using her albuterol nebulizer at home taking Mucinex as needed without improvement.  No fever, bloody sputum abdominal pain, nausea, vomiting.  Stated that she has not received the flu vaccine She was recently treated with Augmentin for a boil.      Review of Systems  Constitutional:  Positive for fatigue. Negative for appetite change, chills and fever.  HENT:  Positive for congestion. Negative for postnasal drip, rhinorrhea and sneezing.   Respiratory:  Positive for cough, shortness of breath and wheezing.   Cardiovascular:  Negative for chest pain, palpitations and leg swelling.  Gastrointestinal:  Negative for abdominal pain, constipation, nausea and vomiting.  Genitourinary:  Negative for difficulty urinating, dysuria, flank pain and frequency.  Musculoskeletal:  Negative for arthralgias, back pain, joint swelling and myalgias.  Skin:  Negative for color change, pallor, rash and wound.  Neurological:  Negative for dizziness, facial asymmetry, weakness, numbness and headaches.  Psychiatric/Behavioral:  Negative for behavioral problems, confusion, self-injury and suicidal ideas.         Objective:     BP (!) 141/88 (BP Location: Right Arm, Patient Position: Sitting, Cuff Size: Normal)   Pulse 95   Ht 5\' 7"  (1.702 m)   Wt 254 lb 12.8 oz (115.6 kg)   LMP 08/24/2015 Comment: irregular periods per pt 09/12/15  SpO2 98%   BMI 39.91 kg/m    Physical Exam Vitals reviewed.  Constitutional:      General: She is not in acute distress.    Appearance: Normal appearance. She is not ill-appearing, toxic-appearing or diaphoretic.  HENT:     Right Ear: Tympanic membrane, ear canal and external ear normal.     Left Ear: Tympanic membrane, ear canal and external ear normal. There is no impacted cerumen.     Nose: Congestion present. No rhinorrhea.     Right Turbinates: Enlarged.     Left Turbinates: Enlarged.     Mouth/Throat:     Mouth: Mucous membranes are moist.     Pharynx: Oropharynx is clear. No oropharyngeal exudate or posterior oropharyngeal erythema.  Eyes:     General: No scleral icterus.       Right eye: No discharge.        Left eye: No discharge.     Extraocular Movements: Extraocular movements intact.     Conjunctiva/sclera: Conjunctivae normal.  Cardiovascular:     Rate and Rhythm: Normal rate and regular rhythm.     Pulses: Normal pulses.     Heart sounds: Normal heart sounds. No murmur heard. Pulmonary:     Effort: Pulmonary effort is normal. No respiratory distress.     Breath sounds: Normal breath sounds. No stridor. No wheezing, rhonchi or rales.  Chest:  Chest wall: No tenderness.  Abdominal:     General: There is no distension.     Palpations: Abdomen is soft.     Tenderness: There is no abdominal tenderness. There is no guarding.  Musculoskeletal:        General: No tenderness.     Cervical back: Normal range of motion and neck supple. No rigidity or tenderness.     Right lower leg: No edema.     Left lower leg: No edema.  Lymphadenopathy:     Cervical: No cervical adenopathy.  Skin:    General: Skin is warm and dry.     Capillary Refill: Capillary  refill takes less than 2 seconds.  Neurological:     Mental Status: She is alert and oriented to person, place, and time.     Motor: No weakness.     Coordination: Coordination normal.     Gait: Gait normal.  Psychiatric:        Mood and Affect: Mood normal.        Behavior: Behavior normal.        Thought Content: Thought content normal.        Judgment: Judgment normal.     No results found for any visits on 01/21/23.      Assessment & Plan:   Problem List Items Addressed This Visit       Respiratory   Asthma   Relevant Medications   methylPREDNISolone (MEDROL DOSEPAK) 4 MG TBPK tablet   albuterol (PROVENTIL) (2.5 MG/3ML) 0.083% nebulizer solution   Acute bronchitis - Primary    1. Acute bronchitis, unspecified organism  - methylPREDNISolone (MEDROL DOSEPAK) 4 MG TBPK tablet; Please take as instructed on the packaging  Dispense: 1 each; Refill: 0 - azithromycin (ZITHROMAX) 250 MG tablet; Take 2 tablets on day 1, then 1 tablet daily on days 2 through 5  Dispense: 6 tablet; Refill: 0 - benzonatate (TESSALON) 200 MG capsule; Take 1 capsule (200 mg total) by mouth 3 (three) times daily as needed for cough.  Dispense: 20 capsule; Refill: 0 - DG Chest 2 View Encouraged to drink at least 64 ounces of water daily to maintain hydration Take OTC Tylenol as needed for fever, body aches Continue albuterol nebulizer as needed       Relevant Medications   methylPREDNISolone (MEDROL DOSEPAK) 4 MG TBPK tablet   azithromycin (ZITHROMAX) 250 MG tablet   benzonatate (TESSALON) 200 MG capsule   Other Relevant Orders   DG Chest 2 View     Other   Sarcoidosis   Relevant Medications   albuterol (PROVENTIL) (2.5 MG/3ML) 0.083% nebulizer solution   Hypokalemia    Lab Results  Component Value Date   NA 138 12/13/2022   K 3.0 (L) 12/13/2022   CO2 28 12/13/2022   GLUCOSE 146 (H) 12/13/2022   BUN 16 12/13/2022   CREATININE 0.97 12/13/2022   CALCIUM 9.5 12/13/2022   EGFR 1.73  12/02/2022   GFRNONAA >60 12/13/2022  Rechecking labs      Relevant Orders   Basic metabolic panel    Meds ordered this encounter  Medications   methylPREDNISolone (MEDROL DOSEPAK) 4 MG TBPK tablet    Sig: Please take as instructed on the packaging    Dispense:  1 each    Refill:  0   azithromycin (ZITHROMAX) 250 MG tablet    Sig: Take 2 tablets on day 1, then 1 tablet daily on days 2 through 5    Dispense:  6 tablet  Refill:  0   benzonatate (TESSALON) 200 MG capsule    Sig: Take 1 capsule (200 mg total) by mouth 3 (three) times daily as needed for cough.    Dispense:  20 capsule    Refill:  0   albuterol (PROVENTIL) (2.5 MG/3ML) 0.083% nebulizer solution    Sig: USE 1 VIAL VIA NEBULIZER EVERY 6 HOURS AS NEEDED FOR WHEEZING OR SHORTNESS OF BREATH    Dispense:  75 mL    Refill:  1    No follow-ups on file.  Donell Beers, FNP

## 2023-01-21 NOTE — Assessment & Plan Note (Signed)
Lab Results  Component Value Date   NA 138 12/13/2022   K 3.0 (L) 12/13/2022   CO2 28 12/13/2022   GLUCOSE 146 (H) 12/13/2022   BUN 16 12/13/2022   CREATININE 0.97 12/13/2022   CALCIUM 9.5 12/13/2022   EGFR 1.73 12/02/2022   GFRNONAA >60 12/13/2022  Rechecking labs

## 2023-01-21 NOTE — Patient Instructions (Signed)
Please take prednisone with food to help prevent stomach upset Take Tylenol 650 mg every 6 hours as needed for fever, headache Please get your chest x-ray done at the [facility] long radiology department   1. Acute bronchitis, unspecified organism  - methylPREDNISolone (MEDROL DOSEPAK) 4 MG TBPK tablet; Please take as instructed on the packaging  Dispense: 1 each; Refill: 0 - azithromycin (ZITHROMAX) 250 MG tablet; Take 2 tablets on day 1, then 1 tablet daily on days 2 through 5  Dispense: 6 tablet; Refill: 0 - benzonatate (TESSALON) 200 MG capsule; Take 1 capsule (200 mg total) by mouth 3 (three) times daily as needed for cough.  Dispense: 20 capsule; Refill: 0 - DG Chest 2 View    It is important that you exercise regularly at least 30 minutes 5 times a week as tolerated  Think about what you will eat, plan ahead. Choose " clean, green, fresh or frozen" over canned, processed or packaged foods which are more sugary, salty and fatty. 70 to 75% of food eaten should be vegetables and fruit. Three meals at set times with snacks allowed between meals, but they must be fruit or vegetables. Aim to eat over a 12 hour period , example 7 am to 7 pm, and STOP after  your last meal of the day. Drink water,generally about 64 ounces per day, no other drink is as healthy. Fruit juice is best enjoyed in a healthy way, by EATING the fruit.  Thanks for choosing [facility] we consider it a privelige to serve you.

## 2023-01-21 NOTE — Assessment & Plan Note (Addendum)
1. Acute bronchitis, unspecified organism  - methylPREDNISolone (MEDROL DOSEPAK) 4 MG TBPK tablet; Please take as instructed on the packaging  Dispense: 1 each; Refill: 0 - azithromycin (ZITHROMAX) 250 MG tablet; Take 2 tablets on day 1, then 1 tablet daily on days 2 through 5  Dispense: 6 tablet; Refill: 0 - benzonatate (TESSALON) 200 MG capsule; Take 1 capsule (200 mg total) by mouth 3 (three) times daily as needed for cough.  Dispense: 20 capsule; Refill: 0 - DG Chest 2 View Encouraged to drink at least 64 ounces of water daily to maintain hydration Take OTC Tylenol as needed for fever, body aches Continue albuterol nebulizer as needed

## 2023-01-22 ENCOUNTER — Ambulatory Visit: Payer: 59 | Attending: Nurse Practitioner

## 2023-01-22 DIAGNOSIS — M25511 Pain in right shoulder: Secondary | ICD-10-CM | POA: Diagnosis present

## 2023-01-22 DIAGNOSIS — M5459 Other low back pain: Secondary | ICD-10-CM | POA: Diagnosis present

## 2023-01-22 DIAGNOSIS — R2689 Other abnormalities of gait and mobility: Secondary | ICD-10-CM | POA: Diagnosis present

## 2023-01-22 DIAGNOSIS — G8929 Other chronic pain: Secondary | ICD-10-CM | POA: Insufficient documentation

## 2023-01-22 LAB — BASIC METABOLIC PANEL
BUN/Creatinine Ratio: 12 (ref 9–23)
BUN: 12 mg/dL (ref 6–24)
CO2: 26 mmol/L (ref 20–29)
Calcium: 9.4 mg/dL (ref 8.7–10.2)
Chloride: 101 mmol/L (ref 96–106)
Creatinine, Ser: 1 mg/dL (ref 0.57–1.00)
Glucose: 96 mg/dL (ref 70–99)
Potassium: 3.9 mmol/L (ref 3.5–5.2)
Sodium: 140 mmol/L (ref 134–144)
eGFR: 67 mL/min/{1.73_m2} (ref >59)

## 2023-01-22 NOTE — Therapy (Addendum)
 OUTPATIENT PHYSICAL THERAPY TREATMENT NOTE/DISCHARGE  PHYSICAL THERAPY DISCHARGE SUMMARY  Visits from Start of Care: 4  Current functional level related to goals / functional outcomes: See goals/objective   Remaining deficits: Unable to assess   Education / Equipment: HEP   Patient agrees to discharge. Patient goals were unable to assess. Patient is being discharged due to not returning since the last visit.     Patient Name: Crystal Bullock MRN: [id number] DOB:[birth year], 57 y.o., female Today's Date: 01/22/2023   END OF SESSION:  PT End of Session - 01/22/23 1138     Visit Number 4    Number of Visits 17    Date for PT Re-Evaluation 03/05/23    Authorization Type [employer] MCR    PT Start Time 1142    PT Stop Time 1222    PT Time Calculation (min) 40 min    Activity Tolerance Patient tolerated treatment well    Behavior During Therapy [facility] for tasks assessed/performed                Past Medical History:  Diagnosis Date   Anxiety 10/06/2015   Asthma    Atypical chest pain 10/06/2015   Chronic neck pain    Fibroid    Hypertension    Palpitations 10/06/2015   RBBB 10/06/2015   Sarcoidosis    Scoliosis    Seasonal allergies    Sickle cell trait (HCC)    Vitamin D  deficiency 05/2019   Past Surgical History:  Procedure Laterality Date   AXILLARY LYMPH NODE DISSECTION     CHOLECYSTECTOMY     Patient Active Problem List   Diagnosis Date Noted   Acute bronchitis 01/21/2023   Hypokalemia 01/21/2023   Vitamin D  deficiency 03/04/2022   Diverticulitis 12/22/2021   Lumbar radiculopathy 09/26/2021   Class 2 severe obesity with serious comorbidity and body mass index (BMI) of 37.0 to 37.9 in adult, unspecified obesity type (HCC) 03/19/2021   Sickle cell trait (HCC) 03/19/2021   Neck pain, chronic 06/09/2019   Tearfulness 02/22/2019   Prediabetes 02/22/2019   Hyperglycemia 02/22/2019   Hemoglobin A1c less than 7.0% 02/22/2019   Yeast infection 11/11/2018    GERD (gastroesophageal reflux disease) 08/19/2018   Mild tetrahydrocannabinol (THC) abuse 08/19/2018   Hidradenitis suppurativa 06/05/2018   Allergic rhinitis 05/08/2018   OSA (obstructive sleep apnea) 05/13/2017   Low back pain 02/24/2017   Sarcoidosis 10/06/2015   Essential hypertension 10/06/2015   Asthma 10/06/2015   Atypical chest pain 10/06/2015   Anxiety 10/06/2015   Palpitations 10/06/2015   RBBB 10/06/2015    PCP: Oley Bascom RAMAN, NP  REFERRING PROVIDER: Oley Bascom RAMAN, NP  REFERRING DIAG: [phone] (ICD-10-CM) - Chronic right-sided low back pain with right-sided sciatica   Rationale for Evaluation and Treatment: Rehabilitation  THERAPY DIAG:  Other low back pain - Plan: PT plan of care cert/re-cert  Chronic right shoulder pain - Plan: PT plan of care cert/re-cert  Other abnormalities of gait and mobility - Plan: PT plan of care cert/re-cert  ONSET DATE: Chronic   SUBJECTIVE:  SUBJECTIVE STATEMENT: Pt presents to PT with reports of continued lower back and L hip pain. Has been compliant with HEP with no adverse effect.   PERTINENT HISTORY:  HTN, Sarcoidosis  PAIN:  Are you having pain?  Yes: NPRS scale: 6/10 Worst: 10/10 Pain location: lower back, left hip Pain description: sharp, N/T Aggravating factors: prolonged sitting, lying down, walking long periods Relieving factors: movement, medication  Are you having pain?  Yes: NPRS scale: 5/10 Worst: 10/10 Pain location: R shoulder Pain description: sharp, occasional  Aggravating factors: reaching overhead, driving Relieving factors: heat, medications  PRECAUTIONS: None  RED FLAGS: None   WEIGHT BEARING RESTRICTIONS: No  FALLS:  Has patient fallen in last 6 months? No  LIVING ENVIRONMENT: Lives with:  lives with their family Lives in: House/apartment Stairs: Yes: External: 23 steps; bilateral but cannot reach both Has following equipment at home: Single point cane  PATIENT GOALS: decrease pain and improving walking distance  NEXT MD VISIT: 02/23/2022   OBJECTIVE:  Note: Objective measures were completed at Evaluation unless otherwise noted. PATIENT SURVEYS:  FOTO (lumbar): 33% function; 49% predicted (shoulder): 53% function; 64% predicted   SENSATION: Light touch: Impaired - decreased to L lateral LE  MUSCLE LENGTH: Hamstrings: Right WFL; Left WFL Thomas test: Right (+); Left (+)  POSTURE: rounded shoulders, forward head, and increased lumbar lordosis  PALPATION: TTP to L upper trap, L infraspinauts  UPPER EXTREMITY ROM:     MMT Right eval Left eval Right 12/19/2022  Shoulder flexion 78 WFL 140  Shoulder extension     Shoulder abduction 55 WFL   Shoulder adduction     Shoulder internal rotation 20 WFL   Shoulder external rotation 35 WFL    (Blank rows = not tested)  LOWER EXTREMITY MMT:    MMT Right eval Left eval  Hip flexion 4/5 5/5  Hip extension    Hip abduction 4/5 5/5  Hip adduction    Hip internal rotation    Hip external rotation    Knee flexion 5/5 5/5  Knee extension 5/5 5/5  Ankle dorsiflexion    Ankle plantarflexion    Ankle inversion    Ankle eversion     (Blank rows = not tested)  LUMBAR SPECIAL TESTS:  Straight leg raise test: Positive and Slump test: Positive  FUNCTIONAL TESTS:  30 Second Sit to Stand: 8 reps  01/01/2023: 12 reps  GAIT: Distance walked: 54ft Assistive device utilized: None Level of assistance: Complete Independence Comments: antalgic gait to R   TREATMENT: [facility] Adult PT Treatment:                                                DATE: 01/22/2023 Therapeutic Exercise: NuStep L5 x 5 min with UE/LE while taking subjective LTR x 10 Supine PPT x 10 - 5 hold Supine PPT with ball 2x10 Supine clamshell 2x15  blue band Supine PPT with march 2x10 blue band Supine SLR 2x10 each Bridge 2x10  Seated figure 4 stretch x 30 - L unable Seated hamstring stretch 2x30 each   [facility] Adult PT Treatment:  DATE: 01/01/2023 Therapeutic Exercise: NuStep L5 x 6 min with UE/LE while taking subjective Side clamshell with left 2 x 10 Piriformis stretch 2 x 20 sec each LTR 5 x 5 sec each Supine shoulder press with dowel to overhead reach 2 x 10 Supine PPT 10 x 5 sec Bridge partial range 2 x 10 Supine shoulder horizontal abduction with red 2 x 10 SLR with physioball press 2 x 10 each Sit to stand 2 x 10 Manual: STM using FR to left gluteal region   [facility] Adult PT Treatment:                                                DATE: 12/19/2022 Therapeutic Exercise: NuStep L4 x 5 min with UE/LE while taking subjective LTR x 10 Piriformis stretch 2 x 20 sec each Supine PPT 10 x 5 sec Supine clamshell unilateral with red 2 x 10 each Supine shoulder press with dowel to overhead reach 2 x 5 Side clamshell with left 2 x 10 Seated hamstring stretch 2 x 20 sec each Seated shoulder blade squeeze 10 x 5 sec Manual: STM using FR to left gluteal region  PATIENT EDUCATION:  Education details: HEP Person educated: Patient Education method: Programmer, multimedia, Demonstration Education comprehension: verbalized understanding and returned demonstration  HOME EXERCISE PROGRAM: Access Code: [email or web address]   ASSESSMENT: CLINICAL IMPRESSION: Pt was able to complete all prescribed exercises with no adverse effect. Therapy today focused on improving core and hip strength secondary to increased lower back pain today. She continues to benefit from skilled PT services, will continue per POC.   EVAL: Patient is a 57 y.o. F who was seen today for physical therapy evaluation and treatment for chronic LBP with referral into R LE. Physical findings are consistent with referring provider impression as  pt demonstrates decrease in core/hip strength and overall functional mobility. FOTO score demonstrates decrease in subjective functional ability below PLOF. Pt would benefit from skilled PT services working on decreasing pain and improving comfort.   OBJECTIVE IMPAIRMENTS: Abnormal gait, decreased activity tolerance, decreased balance, decreased mobility, difficulty walking, decreased ROM, decreased strength, impaired UE functional use, and pain  ACTIVITY LIMITATIONS: carrying, lifting, sitting, standing, squatting, sleeping, stairs, transfers, reach over head, and locomotion level  PARTICIPATION LIMITATIONS: meal prep, cleaning, driving, shopping, community activity, and occupation  PERSONAL FACTORS: Time since onset of injury/illness/exacerbation and 1-2 comorbidities: HTN, Sarcoidosis are also affecting patient's functional outcome.    GOALS: Goals reviewed with patient? No  SHORT TERM GOALS: Target date: 12/23/2022   Pt will be compliant and knowledgeable with initial HEP for improved comfort and carryover Baseline: initial HEP given  01/01/2023: progressing Goal status: ONGOING  2.  Pt will self report lower back and R shoulder pain no greater than 6/10 for improved comfort and functional ability Baseline: 10/10 at worst 01/01/2023: continues to report 10/10 pain Goal status: ONGOING  LONG TERM GOALS: Target date: 03/05/2023     Pt will improve shoulder FOTO function score to no less than 64% as proxy for functional improvement Baseline: 53% function Goal status: IN PROGRESS   2.  Pt will self report lower back and R shoulder pain no greater than 3/10 for improved comfort and functional ability Baseline: 10/10 at worst Goal status: IN PROGRESS   3.  Pt will increase 30 Second Sit to Stand rep count to no  less than 10 reps for improved balance, strength, and functional mobility Baseline: 8 reps  01/01/2023: 12 reps Goal status: MET  4.  Pt will improve lumbar FOTO  function score to no less than 49% as proxy for functional improvement Baseline: 33% function Goal status: INITIAL   5.  Pt will improve R shoulder flex to no less than 130 degrees for improved comfort and function with overhead reaching into cabinets Baseline: see ROM chart Goal status: INITIAL  6.  Pt will walking distance time to no less than 20 minutes for improving comfort and functional ability with desired recreational activity Baseline: 5 minutes Goal status: INITIAL   PLAN: PT FREQUENCY: 1-2x/week  PT DURATION: 8 weeks  PLANNED INTERVENTIONS: 97164- PT Re-evaluation, 97110-Therapeutic exercises, 97530- Therapeutic activity, 97112- Neuromuscular re-education, 97535- Self Care, [id number]- Manual therapy, [id number]- Gait training, [id number]- Aquatic Therapy, 97014- Electrical stimulation (unattended), [id number]- Electrical stimulation (manual), 97016- Vasopneumatic device, Dry Needling, Cryotherapy, and Moist heat  PLAN FOR NEXT SESSION: assess HEP response, core/hip strengthening, periscapular/RTC strengthening   Alm JAYSON Kingdom PT  01/22/23 12:52 PM

## 2023-01-22 NOTE — Telephone Encounter (Signed)
Pt was seen yesterday. Crystal Bullock

## 2023-01-27 ENCOUNTER — Encounter: Payer: 59 | Admitting: Physical Medicine & Rehabilitation

## 2023-01-27 ENCOUNTER — Other Ambulatory Visit: Payer: Self-pay

## 2023-01-27 DIAGNOSIS — J209 Acute bronchitis, unspecified: Secondary | ICD-10-CM

## 2023-01-27 MED ORDER — BENZONATATE 200 MG PO CAPS: 200.0000 mg | ORAL_CAPSULE | Freq: Three times a day (TID) | ORAL | 0 refills | Status: DC | PRN

## 2023-01-27 MED ORDER — AZITHROMYCIN 250 MG PO TABS
ORAL_TABLET | ORAL | 0 refills | Status: AC
Start: 2023-01-27 — End: 2023-02-01

## 2023-01-27 MED ORDER — METHYLPREDNISOLONE 4 MG PO TBPK: ORAL_TABLET | ORAL | 0 refills | Status: AC

## 2023-01-27 NOTE — Telephone Encounter (Signed)
Pt advised that she mistakenly threw them out   . Please advise if you will resend them. Kh

## 2023-01-29 ENCOUNTER — Ambulatory Visit: Payer: Medicare Other | Admitting: Dermatology

## 2023-01-29 ENCOUNTER — Other Ambulatory Visit: Payer: Self-pay | Admitting: Nurse Practitioner

## 2023-01-29 ENCOUNTER — Other Ambulatory Visit: Payer: Self-pay

## 2023-01-29 DIAGNOSIS — K651 Peritoneal abscess: Secondary | ICD-10-CM

## 2023-01-29 DIAGNOSIS — K219 Gastro-esophageal reflux disease without esophagitis: Secondary | ICD-10-CM

## 2023-01-29 MED ORDER — DOCUSATE SODIUM 100 MG PO CAPS: 100.0000 mg | ORAL_CAPSULE | Freq: Two times a day (BID) | ORAL | 0 refills | Status: DC

## 2023-01-29 MED ORDER — ESOMEPRAZOLE MAGNESIUM 40 MG PO CPDR: 40.0000 mg | DELAYED_RELEASE_CAPSULE | Freq: Every day | ORAL | 0 refills | Status: DC

## 2023-01-29 MED ORDER — ONDANSETRON HCL 4 MG PO TABS: 4.0000 mg | ORAL_TABLET | Freq: Three times a day (TID) | ORAL | 0 refills | Status: AC | PRN

## 2023-01-29 MED ORDER — VITAMIN C 500 MG PO TABS
500.0000 mg | ORAL_TABLET | Freq: Every day | ORAL | 0 refills | Status: AC
  Filled 2023-01-29 – 2023-07-09 (×3): qty 100, 100d supply, fill #0

## 2023-01-29 MED ORDER — THERA M PLUS PO TABS: 1.0000 | ORAL_TABLET | Freq: Every day | ORAL | 0 refills | Status: DC

## 2023-02-14 ENCOUNTER — Other Ambulatory Visit: Payer: Self-pay

## 2023-02-18 ENCOUNTER — Encounter: Payer: 59 | Attending: Physical Medicine & Rehabilitation | Admitting: Physical Medicine & Rehabilitation

## 2023-02-19 ENCOUNTER — Ambulatory Visit: Payer: 59 | Admitting: Physical Therapy

## 2023-02-24 ENCOUNTER — Ambulatory Visit: Payer: Self-pay | Admitting: Nurse Practitioner

## 2023-02-24 DIAGNOSIS — G894 Chronic pain syndrome: Secondary | ICD-10-CM | POA: Diagnosis not present

## 2023-02-24 DIAGNOSIS — D860 Sarcoidosis of lung: Secondary | ICD-10-CM | POA: Diagnosis not present

## 2023-02-24 DIAGNOSIS — T402X5A Adverse effect of other opioids, initial encounter: Secondary | ICD-10-CM | POA: Diagnosis not present

## 2023-02-24 DIAGNOSIS — Z0000 Encounter for general adult medical examination without abnormal findings: Secondary | ICD-10-CM | POA: Diagnosis not present

## 2023-02-24 DIAGNOSIS — M51369 Other intervertebral disc degeneration, lumbar region without mention of lumbar back pain or lower extremity pain: Secondary | ICD-10-CM | POA: Diagnosis not present

## 2023-02-24 DIAGNOSIS — E119 Type 2 diabetes mellitus without complications: Secondary | ICD-10-CM | POA: Diagnosis not present

## 2023-02-24 DIAGNOSIS — K5903 Drug induced constipation: Secondary | ICD-10-CM | POA: Diagnosis not present

## 2023-02-24 DIAGNOSIS — I10 Essential (primary) hypertension: Secondary | ICD-10-CM | POA: Diagnosis not present

## 2023-02-24 DIAGNOSIS — Z79899 Other long term (current) drug therapy: Secondary | ICD-10-CM | POA: Diagnosis not present

## 2023-02-26 ENCOUNTER — Ambulatory Visit: Payer: 59 | Admitting: Dermatology

## 2023-02-27 ENCOUNTER — Other Ambulatory Visit: Payer: Self-pay

## 2023-03-03 ENCOUNTER — Other Ambulatory Visit: Payer: Self-pay | Admitting: Nurse Practitioner

## 2023-03-03 ENCOUNTER — Other Ambulatory Visit: Payer: Self-pay

## 2023-03-03 DIAGNOSIS — I10 Essential (primary) hypertension: Secondary | ICD-10-CM

## 2023-03-03 DIAGNOSIS — J209 Acute bronchitis, unspecified: Secondary | ICD-10-CM

## 2023-03-03 DIAGNOSIS — K651 Peritoneal abscess: Secondary | ICD-10-CM

## 2023-03-03 MED ORDER — DOCUSATE SODIUM 100 MG PO CAPS: 100.0000 mg | ORAL_CAPSULE | Freq: Two times a day (BID) | ORAL | 0 refills | Status: DC

## 2023-03-03 MED ORDER — BENZONATATE 200 MG PO CAPS: 200.0000 mg | ORAL_CAPSULE | Freq: Three times a day (TID) | ORAL | 0 refills | Status: DC | PRN

## 2023-03-03 MED ORDER — AMLODIPINE BESYLATE 10 MG PO TABS: ORAL_TABLET | ORAL | 0 refills | Status: DC

## 2023-03-03 MED ORDER — HYDROCHLOROTHIAZIDE 25 MG PO TABS
25.0000 mg | ORAL_TABLET | Freq: Every day | ORAL | 2 refills | Status: DC
  Filled 2023-03-03 (×2): qty 90, 90d supply, fill #0

## 2023-03-03 MED ORDER — THERA M PLUS PO TABS: 1.0000 | ORAL_TABLET | Freq: Every day | ORAL | 0 refills | Status: DC

## 2023-03-05 ENCOUNTER — Other Ambulatory Visit: Payer: Self-pay | Admitting: Nurse Practitioner

## 2023-03-05 ENCOUNTER — Telehealth: Payer: Self-pay

## 2023-03-05 DIAGNOSIS — K651 Peritoneal abscess: Secondary | ICD-10-CM

## 2023-03-05 MED ORDER — AMOXICILLIN-POT CLAVULANATE 875-125 MG PO TABS: 1.0000 | ORAL_TABLET | Freq: Two times a day (BID) | ORAL | 0 refills | Status: DC

## 2023-03-05 NOTE — Telephone Encounter (Signed)
LM

## 2023-03-05 NOTE — Telephone Encounter (Signed)
Sent to  [facility] .

## 2023-03-09 ENCOUNTER — Other Ambulatory Visit: Payer: Self-pay

## 2023-03-09 ENCOUNTER — Emergency Department (HOSPITAL_COMMUNITY): Payer: 59

## 2023-03-09 ENCOUNTER — Emergency Department (HOSPITAL_COMMUNITY)
Admission: EM | Admit: 2023-03-09 | Discharge: 2023-03-09 | Disposition: A | Discharge status: Home / Self Care | Payer: 59 | Attending: Emergency Medicine | Admitting: Emergency Medicine

## 2023-03-09 DIAGNOSIS — E876 Hypokalemia: Secondary | ICD-10-CM | POA: Insufficient documentation

## 2023-03-09 DIAGNOSIS — J45909 Unspecified asthma, uncomplicated: Secondary | ICD-10-CM | POA: Insufficient documentation

## 2023-03-09 DIAGNOSIS — R0789 Other chest pain: Secondary | ICD-10-CM | POA: Diagnosis present

## 2023-03-09 DIAGNOSIS — Z7951 Long term (current) use of inhaled steroids: Secondary | ICD-10-CM | POA: Diagnosis not present

## 2023-03-09 DIAGNOSIS — M940 Chondrocostal junction syndrome [Tietze]: Secondary | ICD-10-CM | POA: Insufficient documentation

## 2023-03-09 DIAGNOSIS — Z743 Need for continuous supervision: Secondary | ICD-10-CM | POA: Diagnosis not present

## 2023-03-09 DIAGNOSIS — I10 Essential (primary) hypertension: Secondary | ICD-10-CM | POA: Diagnosis not present

## 2023-03-09 DIAGNOSIS — Z79899 Other long term (current) drug therapy: Secondary | ICD-10-CM | POA: Diagnosis not present

## 2023-03-09 DIAGNOSIS — R079 Chest pain, unspecified: Secondary | ICD-10-CM | POA: Diagnosis not present

## 2023-03-09 DIAGNOSIS — R6889 Other general symptoms and signs: Secondary | ICD-10-CM | POA: Diagnosis not present

## 2023-03-09 LAB — BASIC METABOLIC PANEL
Anion gap: 14 (ref 5–15)
BUN: 11 mg/dL (ref 6–20)
CO2: 25 mmol/L (ref 22–32)
Calcium: 9.2 mg/dL (ref 8.9–10.3)
Chloride: 99 mmol/L (ref 98–111)
Creatinine, Ser: 1.34 mg/dL — ABNORMAL HIGH (ref 0.44–1.00)
GFR, Estimated: 47 mL/min — ABNORMAL LOW (ref >60)
Glucose, Bld: 139 mg/dL — ABNORMAL HIGH (ref 70–99)
Potassium: 3.1 mmol/L — ABNORMAL LOW (ref 3.5–5.1)
Sodium: 138 mmol/L (ref 135–145)

## 2023-03-09 LAB — CBC
HCT: 34.3 % — ABNORMAL LOW (ref 36.0–46.0)
Hemoglobin: 11.9 g/dL — ABNORMAL LOW (ref 12.0–15.0)
MCH: 25.3 pg — ABNORMAL LOW (ref 26.0–34.0)
MCHC: 34.7 g/dL (ref 30.0–36.0)
MCV: 72.8 fL — ABNORMAL LOW (ref 80.0–100.0)
Platelets: 292 10*3/uL (ref 150–400)
RBC: 4.71 MIL/uL (ref 3.87–5.11)
RDW: 14.3 % (ref 11.5–15.5)
WBC: 10.2 10*3/uL (ref 4.0–10.5)
nRBC: 0 % (ref 0.0–0.2)

## 2023-03-09 LAB — TROPONIN I (HIGH SENSITIVITY)
Troponin I (High Sensitivity): 6 ng/L (ref <18)
Troponin I (High Sensitivity): 6 ng/L (ref <18)

## 2023-03-09 LAB — D-DIMER, QUANTITATIVE: D-Dimer, Quant: 0.42 ug{FEU}/mL (ref 0.00–0.50)

## 2023-03-09 LAB — MAGNESIUM: Magnesium: 2 mg/dL (ref 1.7–2.4)

## 2023-03-09 MED ORDER — POTASSIUM CHLORIDE CRYS ER 20 MEQ PO TBCR
40.0000 meq | EXTENDED_RELEASE_TABLET | Freq: Once | ORAL | Status: AC
Start: 2023-03-09 — End: 2023-03-09
  Administered 2023-03-09: 40 meq via ORAL
  Filled 2023-03-09: qty 2

## 2023-03-09 MED ORDER — LIDOCAINE 5 % EX PTCH
1.0000 | MEDICATED_PATCH | CUTANEOUS | Status: DC
  Administered 2023-03-09: 1 via TRANSDERMAL
  Filled 2023-03-09: qty 1

## 2023-03-09 MED ORDER — KETOROLAC TROMETHAMINE 60 MG/2ML IM SOLN
30.0000 mg | Freq: Once | INTRAMUSCULAR | Status: AC
  Administered 2023-03-09: 30 mg via INTRAMUSCULAR
  Filled 2023-03-09: qty 2

## 2023-03-09 NOTE — ED Triage Notes (Signed)
Patient BIB [facility] from home due to chest pain. Patient reports pain has been going on for 1 day.  Rates pain 7/10 on left side of chest increasing upon inspiration. Patient states pain radiates to left arm. Patient A&Ox4. VSS en route.

## 2023-03-09 NOTE — ED Provider Notes (Signed)
Crystal Bullock EMERGENCY DEPARTMENT AT [facility] Provider Note   CSN: [id number] Arrival date & time: 03/09/23  0539     History  Chief Complaint  Patient presents with   Chest Pain    Crystal Bullock is a 58 y.o. female.   Chest Pain    40 female with medical history significant for hypertension, sarcoidosis, asthma, sickle cell trait, atypical chest pain, last followed with cardiology in 2017 with a reassuring workup who presents to the emergency department with chest pain.  The patient states that she has had roughly 24 hours of sharp left-sided chest discomfort, worse with chest wall excursion and movement.  Pain is also worse when lying flat.  Pain radiates from left side of her chest to her left shoulder.  She denies any cough, fever or chills.  She denies any history of DVT or PE.  She denies any lower extremity swelling.  She had mild nausea earlier, no diaphoresis.  Pain is currently moderate in severity.  Home Medications Prior to Admission medications   Medication Sig Start Date End Date Taking? Authorizing Provider  albuterol (PROVENTIL) (2.5 MG/3ML) 0.083% nebulizer solution USE 1 VIAL VIA NEBULIZER EVERY 6 HOURS AS NEEDED FOR WHEEZING OR SHORTNESS OF BREATH 01/21/23   Paseda, Baird Kay, FNP  albuterol (VENTOLIN HFA) 108 (90 Base) MCG/ACT inhaler INHALE 2 PUFFS INTO THE LUNGS EVERY 6 HOURS FOR UP TO 90 DOSES AS NEEDED FOR WHEEZING 08/12/22   Ivonne Andrew, NP  amLODipine (NORVASC) 10 MG tablet TAKE 1 TABLET(10 MG) BY MOUTH DAILY 03/03/23   Ivonne Andrew, NP  amoxicillin-clavulanate (AUGMENTIN) 875-125 MG tablet Take 1 tablet by mouth 2 (two) times daily. 03/05/23   Ivonne Andrew, NP  ascorbic acid (VITAMIN C) 500 MG tablet Take 1 tablet (500 mg total) by mouth daily. 01/29/23   Ivonne Andrew, NP  baclofen (LIORESAL) 10 MG tablet Take 10 mg by mouth 2 (two) times daily as needed for muscle spasms. 03/02/21   [provider]  benzonatate  (TESSALON) 200 MG capsule Take 1 capsule (200 mg total) by mouth 3 (three) times daily as needed for cough. 03/03/23   Ivonne Andrew, NP  busPIRone (BUSPAR) 7.5 MG tablet Take 1 tablet (7.5 mg total) by mouth 3 (three) times daily. Patient taking differently: Take 7.5 mg by mouth 2 (two) times daily as needed (anxiety). 08/24/18   Mike Gip, FNP  cetirizine (ZYRTEC) 10 MG tablet TAKE 1 TABLET BY MOUTH DAILY AS NEEDED FOR ALLERGIES 11/13/22   Ivonne Andrew, NP  clindamycin (CLINDAGEL) 1 % gel Apply topically 2 (two) times daily. 11/23/21   Ivonne Andrew, NP  cyclobenzaprine (FLEXERIL) 10 MG tablet Take 1 tablet (10 mg total) by mouth 3 (three) times daily as needed for muscle spasms. 02/11/21   Fayrene Helper, PA-C  docusate sodium (COLACE) 100 MG capsule Take 1 capsule (100 mg total) by mouth 2 (two) times daily. 03/03/23   Ivonne Andrew, NP  esomeprazole (NEXIUM) 40 MG capsule Take 1 capsule (40 mg total) by mouth daily. 01/29/23 04/29/23  Ivonne Andrew, NP  fluticasone (FLONASE) 50 MCG/ACT nasal spray PLACE 2 SPRAYS INTO BOTH NOSTRILS DAILY AS NEEDED. ALLERGIES 11/21/22   Ivonne Andrew, NP  gabapentin (NEURONTIN) 300 MG capsule Take 300 mg by mouth 3 (three) times daily. 10/28/22   [provider]  hydrochlorothiazide (HYDRODIURIL) 25 MG tablet Take 1 tablet (25 mg total) by mouth daily. 03/03/23   Tanda Rockers,  Gildardo Pounds, NP  hydrOXYzine (VISTARIL) 50 MG capsule Take as prescribed as AVS. 04/07/19   Barbette Merino, NP  LINZESS 145 MCG CAPS capsule  12/25/22   [provider]  LINZESS 72 MCG capsule Take 1 capsule (72 mcg total) by mouth daily. 10/22/21   Ivonne Andrew, NP  meloxicam (MOBIC) 15 MG tablet Take 15 mg by mouth daily. 04/26/22   [provider]  methocarbamol (ROBAXIN) 500 MG tablet Take 1 tablet (500 mg total) by mouth every 6 (six) hours as needed for muscle spasms. 09/13/21   Arby Barrette, MD  methylPREDNISolone (MEDROL DOSEPAK) 4 MG TBPK tablet  Please take as instructed on the packaging 01/27/23   Ivonne Andrew, NP  Multiple Vitamins-Minerals (MULTIVITAMINS THER. W/MINERALS) TABS tablet Take 1 tablet by mouth daily. 03/03/23   Ivonne Andrew, NP  NON FORMULARY CPAP at bedtime    [provider]  nystatin ointment (MYCOSTATIN) Apply 1 application. topically 2 (two) times daily. 07/03/21   Passmore, Enid Derry I, NP  ondansetron (ZOFRAN) 4 MG tablet Take 1 tablet (4 mg total) by mouth every 8 (eight) hours as needed for nausea or vomiting. 01/29/23   Ivonne Andrew, NP  oxyCODONE-acetaminophen (PERCOCET) 10-325 MG tablet Take 1 tablet by mouth every 8 (eight) hours as needed for pain.    [provider]  promethazine (PHENERGAN) 12.5 MG tablet Take 1 tablet (12.5 mg total) by mouth every 8 (eight) hours as needed for nausea or vomiting. 11/27/22   Ivonne Andrew, NP  Semaglutide,0.25 or 0.5MG /DOS, (OZEMPIC, 0.25 OR 0.5 MG/DOSE,) 2 MG/3ML SOPN INJECT 0.5 MG INTO THE SKIN ONE TIME PER WEEK 10/09/22   Ivonne Andrew, NP  sucralfate (CARAFATE) 1 g tablet Take 1 tablet (1 g total) by mouth 4 (four) times daily as needed. 07/22/21   Sabas Sous, MD  Vitamin D, Ergocalciferol, (DRISDOL) 1.25 MG (50000 UNIT) CAPS capsule Take 1 capsule (50,000 Units total) by mouth every 7 (seven) days. 11/13/22   Ivonne Andrew, NP      Allergies    Bee venom and Iodinated contrast media    Review of Systems   Review of Systems  Cardiovascular:  Positive for chest pain.  All other systems reviewed and are negative.   Physical Exam Updated Vital Signs BP 131/80   Pulse 73   Temp 97.9 F (36.6 C) (Oral)   Resp 20   Ht 5\' 7"  (1.702 m)   Wt 114.3 kg   LMP 08/24/2015 Comment: irregular periods per pt 09/12/15  SpO2 96%   BMI 39.47 kg/m  Physical Exam Vitals and nursing note reviewed.  Constitutional:      General: She is not in acute distress.    Appearance: She is well-developed. She is obese.  HENT:     Head:  Normocephalic and atraumatic.  Eyes:     Conjunctiva/sclera: Conjunctivae normal.  Cardiovascular:     Rate and Rhythm: Normal rate and regular rhythm.     Heart sounds: No murmur heard. Pulmonary:     Effort: Pulmonary effort is normal. No respiratory distress.     Breath sounds: Normal breath sounds.  Chest:     Comments: Left-sided chest wall tenderness to palpation that reproduces the patient's pain and discomfort, no rash Abdominal:     Palpations: Abdomen is soft.     Tenderness: There is no abdominal tenderness.  Musculoskeletal:        General: No swelling.  Cervical back: Neck supple.  Skin:    General: Skin is warm and dry.     Capillary Refill: Capillary refill takes less than 2 seconds.  Neurological:     Mental Status: She is alert.  Psychiatric:        Mood and Affect: Mood normal.     ED Results / Procedures / Treatments   Labs (all labs ordered are listed, but only abnormal results are displayed) Labs Reviewed  BASIC METABOLIC PANEL - Abnormal; Notable for the following components:      Result Value   Potassium 3.1 (*)    Glucose, Bld 139 (*)    Creatinine, Ser 1.34 (*)    GFR, Estimated 47 (*)    All other components within normal limits  CBC - Abnormal; Notable for the following components:   Hemoglobin 11.9 (*)    HCT 34.3 (*)    MCV 72.8 (*)    MCH 25.3 (*)    All other components within normal limits  MAGNESIUM  D-DIMER, QUANTITATIVE  TROPONIN I (HIGH SENSITIVITY)  TROPONIN I (HIGH SENSITIVITY)    EKG EKG Interpretation Date/Time:  Sunday March 09 2023 05:55:32 EST Ventricular Rate:  77 PR Interval:  162 QRS Duration:  152 QT Interval:  456 QTC Calculation: 516 R Axis:   77  Text Interpretation: Normal sinus rhythm Right bundle branch block Abnormal ECG When compared with ECG of 22-Dec-2021 02:24, No significant change was found Confirmed by Dione Booze ([id number]) on 03/09/2023 6:18:55 AM  Radiology DG Chest 2 View Result Date:  03/09/2023 CLINICAL DATA:  Chest pain onset 1 day ago. EXAM: CHEST - 2 VIEW COMPARISON:  Portable chest 07/21/2021 FINDINGS: The heart size and mediastinal contours are within normal limits. Both lungs are clear. The visualized skeletal structures are unremarkable. Compare: Unchanged. IMPRESSION: No evidence of acute chest disease. Electronically Signed   By: Almira Bar M.D.   On: 03/09/2023 07:55    Procedures Procedures    Medications Ordered in ED Medications  lidocaine (LIDODERM) 5 % 1 patch (1 patch Transdermal Patch Applied 03/09/23 0849)  potassium chloride SA (KLOR-CON M) CR tablet 40 mEq (40 mEq Oral Given 03/09/23 0848)  ketorolac (TORADOL) injection 30 mg (30 mg Intramuscular Given 03/09/23 0848)    ED Course/ Medical Decision Making/ A&P             HEART Score: 3                    Medical Decision Making Amount and/or Complexity of Data Reviewed Labs: ordered.  Risk Prescription drug management.    45 female with medical history significant for hypertension, sarcoidosis, asthma, sickle cell trait, atypical chest pain, last followed with cardiology in 2017 with a reassuring workup who presents to the emergency department with chest pain.  The patient states that she has had roughly 24 hours of sharp left-sided chest discomfort, worse with chest wall excursion and movement.  Pain is also worse when lying flat.  Pain radiates from left side of her chest to her left shoulder.  She denies any cough, fever or chills.  She denies any history of DVT or PE.  She denies any lower extremity swelling.  She had mild nausea earlier, no diaphoresis.  Pain is currently moderate in severity.  Medical Decision Making: Crystal Bullock is a 58 y.o. female who presented to the ED today with chest pain, detailed above.  Based on patient's comorbidities, patient has a heart score  of 3.    Patient's presentation is complicated by their history of sarcoidosis.  Complete initial physical  exam performed, notably the patient was TTP of the left chest wall.   Reviewed and confirmed nursing documentation for past medical history, family history, social history.    Initial Assessment: With the patient's presentation of left-sided chest pain, most likely diagnosis is musculoskeletal chest pain versus GERD, although ACS remains on the differential. Other diagnoses were considered including (but not limited to) pulmonary embolism, community-acquired pneumonia, aortic dissection, pneumothorax, underlying bony abnormality, anemia. These are considered less likely due to history of present illness and physical exam findings.    In particular, concerning pulmonary embolism: Patient is PERC positive and the they deny malignancy, recent surgery, history of DVT, or calf tenderness leading to a low risk Wells score. Aortic Dissection also considered but seems less likely based on the location, quality, onset, and severity of symptoms in this case.   Initial Plan: Evaluate for ACS with delta troponin and EKG evaluated as below  Evaluate for dissection, bony abnormality, or pneumonia with chest x-ray and screening laboratory evaluation including CBC, BMP  Further evaluation for pulmonary embolism indicated at this time based on patient's PERC and Wells score.  Further evaluation for Thoracic Aortic Dissection not indicated at this time based on patient's clinical history and PE findings.   Initial Study Results: EKG was reviewed independently. Rate, rhythm, axis, intervals all examined and without medically relevant abnormality. ST segments without concerns for elevations.  RBBB present.  Laboratory  Delta  troponin demonstrated normal values  Dimer negative.   CBC and BMP revealed no leukocytosis, mild hypokalemia to 3.1, creatinine appears to be close to the patient's baseline of 1.1.  Magnesium was collected and normal.    Radiology  DG Chest 2 View Result Date: 03/09/2023 CLINICAL DATA:   Chest pain onset 1 day ago. EXAM: CHEST - 2 VIEW COMPARISON:  Portable chest 07/21/2021 FINDINGS: The heart size and mediastinal contours are within normal limits. Both lungs are clear. The visualized skeletal structures are unremarkable. Compare: Unchanged. IMPRESSION: No evidence of acute chest disease. Electronically Signed   By: Almira Bar M.D.   On: 03/09/2023 07:55    Final Assessment and Plan: Patient with a low risk presentation, heart score of 3.  Pt administered lidocaine patch and Toradol, on reassessment the patient was symptomatically improved.  She her potassium was replenished orally with 40 mEq of potassium chloride.  Suspect likely costochondritis and/or musculoskeletal chest wall pain.  Recommended NSAIDs over the next few days.  Recommended outpatient follow-up with her primary care provider, return precautions provided.  Final Clinical Impression(s) / ED Diagnoses Final diagnoses:  Musculoskeletal chest pain  Hypokalemia  Costochondral chest pain    Rx / DC Orders ED Discharge Orders     None         Ernie Avena, MD 03/09/23 1025

## 2023-03-09 NOTE — ED Notes (Signed)
Pt is on the phone cussing staff members out. Stated she will report this RN. Pt stated she has been here for 4 hrs, but in fact only 2 hours. Pt took this RN's name stated she will report this RN to customer service.

## 2023-03-09 NOTE — ED Notes (Signed)
Patient transported to CT

## 2023-03-09 NOTE — Discharge Instructions (Addendum)
Your EKG, chest x-ray and laboratory evaluation was overall reassuring.  Your symptoms are most consistent with musculoskeletal chest pain and/or costochondritis and improved with a lidocaine patch and Toradol.  Recommend NSAIDs for continued management over the next few days.  Your cardiac risk score is low risk.  Follow-up with your primary care provider to ensure resolution, return to the emergency department if you have any severe worsening symptoms to include chest pressure like an elephant sitting on your chest, ripping tearing chest pain that radiates to your back, cough and shortness of breath, lightheadedness and/or syncope.

## 2023-03-10 ENCOUNTER — Telehealth: Payer: Self-pay

## 2023-03-10 NOTE — Transitions of Care (Post Inpatient/ED Visit) (Cosign Needed)
03/10/2023  Name: Crystal Bullock MRN: [id number] DOB: 15-Dec-1965  Today's TOC FU Call Status: Today's TOC FU Call Status:: Successful TOC FU Call Completed TOC FU Call Complete Date: 03/10/23 Patient's Name and Date of Birth confirmed.  Transition Care Management Follow-up Telephone Call Date of Discharge: 03/09/23 Discharge Facility: [facility]) Type of Discharge: Emergency Department Reason for ED Visit: Other: How have you been since you were released from the hospital?: Same Any questions or concerns?: No  Items Reviewed: Did you receive and understand the discharge instructions provided?: Yes Medications obtained,verified, and reconciled?: Yes (Medications Reviewed) Any new allergies since your discharge?: No Dietary orders reviewed?: NA Do you have support at home?: Yes People in Home: child(ren), adult  Medications Reviewed Today: Medications Reviewed Today     Reviewed by Renelda Loma, RMA (Registered Medical Assistant) on 03/10/23 at 1025  Med List Status: <None>   Medication Order Taking? Sig Documenting Provider Last Dose Status Informant  albuterol (PROVENTIL) (2.5 MG/3ML) 0.083% nebulizer solution [id number] Yes USE 1 VIAL VIA NEBULIZER EVERY 6 HOURS AS NEEDED FOR WHEEZING OR SHORTNESS OF BREATH Paseda, Baird Kay, FNP Taking Active   albuterol (VENTOLIN HFA) 108 (90 Base) MCG/ACT inhaler [id number] Yes INHALE 2 PUFFS INTO THE LUNGS EVERY 6 HOURS FOR UP TO 90 DOSES AS NEEDED FOR WHEEZING Ivonne Andrew, NP Taking Active   amLODipine (NORVASC) 10 MG tablet [id number] Yes TAKE 1 TABLET(10 MG) BY MOUTH DAILY Ivonne Andrew, NP Taking Active   amoxicillin-clavulanate (AUGMENTIN) 875-125 MG tablet [id number] No Take 1 tablet by mouth 2 (two) times daily.  Patient not taking: Reported on 03/10/2023   Ivonne Andrew, NP Not Taking Active   ascorbic acid (VITAMIN C) 500 MG tablet [id number] Yes Take 1 tablet (500 mg total) by mouth daily. Ivonne Andrew, NP  Taking Active   baclofen (LIORESAL) 10 MG tablet [id number] Yes Take 10 mg by mouth 2 (two) times daily as needed for muscle spasms. [provider] Taking Active Self  benzonatate (TESSALON) 200 MG capsule [id number] No Take 1 capsule (200 mg total) by mouth 3 (three) times daily as needed for cough.  Patient not taking: Reported on 03/10/2023   Ivonne Andrew, NP Not Taking Active   busPIRone (BUSPAR) 7.5 MG tablet [id number] Yes Take 1 tablet (7.5 mg total) by mouth 3 (three) times daily.  Patient taking differently: Take 7.5 mg by mouth 2 (two) times daily as needed (anxiety).   Mike Gip, FNP Taking Active Self           Med Note Margo Aye, Tracie Harrier Jul 22, 2021  3:52 AM)    cetirizine (ZYRTEC) 10 MG tablet [id number] Yes TAKE 1 TABLET BY MOUTH DAILY AS NEEDED FOR ALLERGIES Ivonne Andrew, NP Taking Active   clindamycin (CLINDAGEL) 1 % gel [id number] Yes Apply topically 2 (two) times daily. Ivonne Andrew, NP Taking Active Self  cyclobenzaprine (FLEXERIL) 10 MG tablet [id number]  Take 1 tablet (10 mg total) by mouth 3 (three) times daily as needed for muscle spasms. Fayrene Helper, PA-C  Active Self  docusate sodium (COLACE) 100 MG capsule [id number] Yes Take 1 capsule (100 mg total) by mouth 2 (two) times daily. Ivonne Andrew, NP Taking Active   esomeprazole (NEXIUM) 40 MG capsule [id number] Yes Take 1 capsule (40 mg total) by mouth daily. Ivonne Andrew, NP Taking Active   fluticasone [facility]) 50 MCG/ACT nasal spray [id number] Yes PLACE 2 SPRAYS INTO BOTH  NOSTRILS DAILY AS NEEDED. ALLERGIES Ivonne Andrew, NP Taking Active   gabapentin (NEURONTIN) 300 MG capsule [id number] Yes Take 300 mg by mouth 3 (three) times daily. [provider] Taking Active   hydrochlorothiazide (HYDRODIURIL) 25 MG tablet [id number] Yes Take 1 tablet (25 mg total) by mouth daily. Ivonne Andrew, NP Taking Active   hydrOXYzine (VISTARIL) 50 MG capsule [id number] Yes Take as prescribed as AVS.  Barbette Merino, NP Taking Active Self  LINZESS 145 MCG CAPS capsule [id number] Yes  [provider] Taking Active   LINZESS 72 MCG capsule [id number] No Take 1 capsule (72 mcg total) by mouth daily.  Patient not taking: Reported on 03/10/2023   Ivonne Andrew, NP Not Taking Active Self  meloxicam (MOBIC) 15 MG tablet [id number] Yes Take 15 mg by mouth daily. [provider] Taking Active   methocarbamol (ROBAXIN) 500 MG tablet [id number] Yes Take 1 tablet (500 mg total) by mouth every 6 (six) hours as needed for muscle spasms. Arby Barrette, MD Taking Active Self  methylPREDNISolone (MEDROL DOSEPAK) 4 MG TBPK tablet [id number] Yes Please take as instructed on the packaging Ivonne Andrew, NP Taking Active   Multiple Vitamins-Minerals (MULTIVITAMINS THER. W/MINERALS) TABS tablet [id number] Yes Take 1 tablet by mouth daily. Ivonne Andrew, NP Taking Active   Clent Demark [id number] Yes CPAP at bedtime [provider] Taking Active Self  nystatin ointment (MYCOSTATIN) [id number] Yes Apply 1 application. topically 2 (two) times daily. Orion Crook I, NP Taking Active Self  ondansetron (ZOFRAN) 4 MG tablet [id number] Yes Take 1 tablet (4 mg total) by mouth every 8 (eight) hours as needed for nausea or vomiting. Ivonne Andrew, NP Taking Active   oxyCODONE-acetaminophen (PERCOCET) 10-325 MG tablet [id number]  Take 1 tablet by mouth every 8 (eight) hours as needed for pain. [provider]  Active Self           Med Note Margo Aye, MISTY D   Sun Jul 22, 2021  3:47 AM)    promethazine (PHENERGAN) 12.5 MG tablet [id number] Yes Take 1 tablet (12.5 mg total) by mouth every 8 (eight) hours as needed for nausea or vomiting. Ivonne Andrew, NP Taking Active   Semaglutide,0.25 or 0.5MG /DOS, (OZEMPIC, 0.25 OR 0.5 MG/DOSE,) 2 MG/3ML SOPN [id number] Yes INJECT 0.5 MG INTO THE SKIN ONE TIME PER WEEK Ivonne Andrew, NP Taking Active   sucralfate (CARAFATE) 1 g tablet [id number] Yes  Take 1 tablet (1 g total) by mouth 4 (four) times daily as needed. Sabas Sous, MD Taking Active Self  Vitamin D, Ergocalciferol, (DRISDOL) 1.25 MG (50000 UNIT) CAPS capsule [id number] Yes Take 1 capsule (50,000 Units total) by mouth every 7 (seven) days. Ivonne Andrew, NP Taking Active             Home Care and Equipment/Supplies: Were Home Health Services Ordered?: NA Any new equipment or medical supplies ordered?: NA  Functional Questionnaire: Do you need assistance with bathing/showering or dressing?: No Do you need assistance with meal preparation?: No Do you need assistance with eating?: No Do you have difficulty maintaining continence: No Do you need assistance with getting out of bed/getting out of a chair/moving?: No Do you have difficulty managing or taking your medications?: No  Follow up appointments reviewed: PCP Follow-up appointment confirmed?: Yes Date of PCP follow-up appointment?: 03/14/23 Follow-up Provider: Angus Seller Specialist [facility] Follow-up appointment confirmed?: No Do you need transportation to your follow-up appointment?: No  SDOH Interventions Today  Flowsheet Row Most Recent Value  SDOH Interventions   Food Insecurity Interventions Intervention Not Indicated  Housing Interventions Intervention Not Indicated  Transportation Interventions Intervention Not Indicated  Utilities Interventions Intervention Not Indicated       SIGNATURE Renelda Loma RMA

## 2023-03-11 ENCOUNTER — Telehealth: Payer: Self-pay | Admitting: Nurse Practitioner

## 2023-03-11 NOTE — Telephone Encounter (Signed)
Copied from CRM [id number]. Topic: Clinical - Medication Question >> Mar 11, 2023  8:06 AM Ivette P wrote: Reason for CRM: Pt is wanting to know if she still takes her potassium medication, Callback [phone]

## 2023-03-12 ENCOUNTER — Ambulatory Visit: Payer: Self-pay | Admitting: Nurse Practitioner

## 2023-03-12 ENCOUNTER — Ambulatory Visit: Payer: 59 | Admitting: Nurse Practitioner

## 2023-03-12 ENCOUNTER — Other Ambulatory Visit: Payer: Self-pay | Admitting: Nurse Practitioner

## 2023-03-12 DIAGNOSIS — E876 Hypokalemia: Secondary | ICD-10-CM

## 2023-03-12 MED ORDER — POTASSIUM CHLORIDE CRYS ER 20 MEQ PO TBCR
20.0000 meq | EXTENDED_RELEASE_TABLET | Freq: Two times a day (BID) | ORAL | 0 refills | Status: AC
Start: 2023-03-12 — End: ?

## 2023-03-12 NOTE — Telephone Encounter (Signed)
  Chief Complaint: weakness, changes in speech, lightheaded Symptoms: headaches, speech feels slow and slurred, lightheaded/dizzy, SOB at rest, fatigue Frequency: x few days, constant Pertinent Negatives: Patient denies changes in vision, chest pain Disposition: [x] ED /[] Urgent Care (no appt availability in office) / [] Appointment(In office/virtual)/ []  [place] [facility]/ [] Home Care/ [] Refused Recommended Disposition /[] [place] [facility]/ []  Follow-up with PCP Additional Notes: Recommended patient call 911, she states she is in the car picking up her grandchildren but is agreeable to go to ED. Patient concerned about her potassium levels and thinks they might be off. Patient states her changes in speech were sudden and she states yesterday she felt like people were looking at her weird while she was speaking. Advised patient to call 911 or go to ED to be evaluated for possible stroke like symptoms.   Copied from CRM [id number]. Topic: Clinical - Red Word Triage >> Mar 12, 2023  2:04 PM Phill Myron wrote: Red Word that prompted transfer to Nurse Triage: Light headiness, clammy, slurring words, feel drowsy,  hx of copd and sarcoidosis Reason for Disposition  [1] Loss of speech or garbled speech AND [2] sudden onset AND [3] present now  Answer Assessment - Initial Assessment Questions 1. SYMPTOM: "What is the main symptom you are concerned about?" (e.g., weakness, numbness)     Weakness, lightheaded, slow and slurred speech.  2. ONSET: "When did this start?" (minutes, hours, days; while sleeping)     Since Monday.  3. LAST NORMAL: "When was the last time you (the patient) were normal (no symptoms)?"     Saturday.  4. PATTERN "Does this come and go, or has it been constant since it started?"  "Is it present now?"     Constant; she states she feels a little better today than yesterday.  5. CARDIAC SYMPTOMS: "Have you had any of the following symptoms: chest pain,  difficulty breathing, palpitations?"     Difficulty breathing, feels SOB and tired after normal levels of exertion. She states "it feels like I've worked out and I just woke up".  6. NEUROLOGIC SYMPTOMS: "Have you had any of the following symptoms: headache, dizziness, vision loss, double vision, changes in speech, unsteady on your feet?"     Headache, speech feels slow and slurred (speech does not sound slurred),   7. OTHER SYMPTOMS: "Do you have any other symptoms?"     SOB, she states she just doesn't feel like herself, back pain  Protocols used: Neurologic Deficit-A-AH

## 2023-03-14 ENCOUNTER — Encounter (HOSPITAL_BASED_OUTPATIENT_CLINIC_OR_DEPARTMENT_OTHER): Payer: Self-pay

## 2023-03-14 ENCOUNTER — Encounter: Payer: Self-pay | Admitting: Nurse Practitioner

## 2023-03-14 ENCOUNTER — Other Ambulatory Visit: Payer: Self-pay

## 2023-03-14 ENCOUNTER — Ambulatory Visit (INDEPENDENT_AMBULATORY_CARE_PROVIDER_SITE_OTHER): Payer: 59 | Admitting: Nurse Practitioner

## 2023-03-14 ENCOUNTER — Other Ambulatory Visit: Payer: Self-pay | Admitting: Nurse Practitioner

## 2023-03-14 ENCOUNTER — Emergency Department (HOSPITAL_BASED_OUTPATIENT_CLINIC_OR_DEPARTMENT_OTHER)
Admission: EM | Admit: 2023-03-14 | Discharge: 2023-03-14 | Disposition: A | Discharge status: Home / Self Care | Payer: 59

## 2023-03-14 VITALS — BP 119/72 | HR 80 | Temp 97.5°F | Wt 252.0 lb

## 2023-03-14 DIAGNOSIS — D649 Anemia, unspecified: Secondary | ICD-10-CM

## 2023-03-14 DIAGNOSIS — I10 Essential (primary) hypertension: Secondary | ICD-10-CM | POA: Diagnosis not present

## 2023-03-14 DIAGNOSIS — E876 Hypokalemia: Secondary | ICD-10-CM

## 2023-03-14 DIAGNOSIS — R0789 Other chest pain: Secondary | ICD-10-CM | POA: Diagnosis not present

## 2023-03-14 DIAGNOSIS — R5383 Other fatigue: Secondary | ICD-10-CM | POA: Diagnosis not present

## 2023-03-14 DIAGNOSIS — F32A Depression, unspecified: Secondary | ICD-10-CM

## 2023-03-14 DIAGNOSIS — Z09 Encounter for follow-up examination after completed treatment for conditions other than malignant neoplasm: Secondary | ICD-10-CM | POA: Diagnosis not present

## 2023-03-14 DIAGNOSIS — R718 Other abnormality of red blood cells: Secondary | ICD-10-CM | POA: Diagnosis not present

## 2023-03-14 DIAGNOSIS — Z79899 Other long term (current) drug therapy: Secondary | ICD-10-CM | POA: Diagnosis not present

## 2023-03-14 DIAGNOSIS — E119 Type 2 diabetes mellitus without complications: Secondary | ICD-10-CM | POA: Diagnosis not present

## 2023-03-14 DIAGNOSIS — F419 Anxiety disorder, unspecified: Secondary | ICD-10-CM

## 2023-03-14 HISTORY — DX: Type 2 diabetes mellitus without complications: E11.9

## 2023-03-14 LAB — CBG MONITORING, ED: Glucose-Capillary: 121 mg/dL — ABNORMAL HIGH (ref 70–99)

## 2023-03-14 LAB — CBC
HCT: 36.6 % (ref 36.0–46.0)
Hemoglobin: 12.5 g/dL (ref 12.0–15.0)
MCH: 24.9 pg — ABNORMAL LOW (ref 26.0–34.0)
MCHC: 34.2 g/dL (ref 30.0–36.0)
MCV: 72.8 fL — ABNORMAL LOW (ref 80.0–100.0)
Platelets: 286 10*3/uL (ref 150–400)
RBC: 5.03 MIL/uL (ref 3.87–5.11)
RDW: 14.2 % (ref 11.5–15.5)
WBC: 10.3 10*3/uL (ref 4.0–10.5)
nRBC: 0 % (ref 0.0–0.2)

## 2023-03-14 LAB — BASIC METABOLIC PANEL
Anion gap: 9 (ref 5–15)
BUN: 11 mg/dL (ref 6–20)
CO2: 30 mmol/L (ref 22–32)
Calcium: 9.6 mg/dL (ref 8.9–10.3)
Chloride: 99 mmol/L (ref 98–111)
Creatinine, Ser: 1.05 mg/dL — ABNORMAL HIGH (ref 0.44–1.00)
GFR, Estimated: >60 mL/min (ref >60)
Glucose, Bld: 129 mg/dL — ABNORMAL HIGH (ref 70–99)
Potassium: 3.5 mmol/L (ref 3.5–5.1)
Sodium: 138 mmol/L (ref 135–145)

## 2023-03-14 LAB — MAGNESIUM: Magnesium: 1.7 mg/dL (ref 1.7–2.4)

## 2023-03-14 MED ORDER — CHLORTHALIDONE 25 MG PO TABS: 12.5000 mg | ORAL_TABLET | Freq: Every day | ORAL | 1 refills | Status: DC

## 2023-03-14 MED ORDER — HYDROCHLOROTHIAZIDE 25 MG PO TABS: 25.0000 mg | ORAL_TABLET | Freq: Every day | ORAL | 2 refills | Status: DC

## 2023-03-14 MED ORDER — HYDROCHLOROTHIAZIDE 25 MG PO TABS: 25.0000 mg | ORAL_TABLET | Freq: Every day | ORAL | 0 refills | Status: DC

## 2023-03-14 MED ORDER — SODIUM CHLORIDE 0.9 % IV BOLUS
1000.0000 mL | Freq: Once | INTRAVENOUS | Status: AC
  Administered 2023-03-14: 1000 mL via INTRAVENOUS

## 2023-03-14 NOTE — ED Notes (Signed)
 Discharge paperwork given and verbally understood.

## 2023-03-14 NOTE — Assessment & Plan Note (Signed)
Flowsheet Row Office Visit from 03/14/2023 in [facility] - A Dept Of [facility]  PHQ-9 Total Score 21     List of therapist in this area provided Encouraged to seek care at [facility] behavioral health care clinic if needed for crisis She currently denies SI, HI

## 2023-03-14 NOTE — Patient Instructions (Addendum)
1. Hypokalemia (Primary)  - CMP14+EGFR  2. Essential hypertension  - CMP14+EGFR  3. Type 2 diabetes mellitus without complication, without long-term current use of insulin (HCC)  - Hemoglobin A1c  4. Costochondral chest pain   5. Encounter for examination following treatment at hospital   .bhBehavioral Health Resources:    What if I or someone I know is in crisis?   If you are thinking about harming yourself or having thoughts of suicide, or if you know someone who is, seek help right away.   Call your doctor or mental health care provider.   Call 911 or go to a hospital emergency room to get immediate help, or ask a friend or family member to help you do these things; IF YOU ARE IN [place], YOU MAY GO TO WALK-IN URGENT CARE 24/7 at [facility] (see below)   Call the [place] National Suicide Prevention Lifeline's toll-free, 24-hour hotline at 1-800-273-TALK (863)699-9975) or TTY: 1-800-799-4 TTY 810-098-7455) to talk to a trained counselor.   If you are in crisis, make sure you are not left alone.    If someone else is in crisis, make sure he or she is not left alone     24 Hour :    [facility]  [address], [place], [place] [address] [phone] or [phone] WALK-IN URGENT CARE 24/7   [facility] Mobile Crisis: ([phone]   [place] National Suicide Hotline: 845 341 0337   [facility] [employer] (Domestic Violence, Rape & Victim Assistance)  3473775656   [facility]  [address], [place]  [address]   [phone] or [phone]    [facility] [employer] [facility]: [phone] (8am-4pm) or [phone] (after hours)          [facility], [address], [place], [place]  [phone] Fax: [phone] [email or web address] *Interpreters available *Accepts all insurance  and uninsured for Urgent Care needs *Accepts Medicaid and uninsured for outpatient treatment    [facility]   Mon-Fri: 8am-5pm [address], [place], [place] [phone](EXHBZ); [phone](fax) [email or web address]  *Accepts Medicare   [facility] [place], Fri: 8am-4pm [address], [place], [place] [address] [phone] (phone); [phone] (fax) ExShows.[email or web address]  *Accepts Medicare   [facility] Mon-Fri: 9am-5pm  [address] [place], [place] [phone] (phone); [phone]  [email or web address]  *Accepts Medicaid   [facility], 8:30am-12pm/1pm-2:30pm [address], [place], [place] [phone] (phone); [phone] (fax) www.[email or web address]  *Accepts Medicaid, sliding-scale*Bilingual services available   [facility] Mon-Fri, 8am-7pm [address], [place], [place]  [phone](XBDZH); [phone](fax) www.[email or web address]  *Accepts Medicaid *Bilingual services available   [facility] Mon-Fri: 8am-5pm, Saturday by appointment only [address] [place], [place], [place] [phone] (phone); [phone] (fax) [email or web address]    [facility] [address], [place], [place] [phone] [email or web address]  *Free & reduced services for uninsured and underinsured individuals *Bilingual services for Spanish-speaking clients 21 and under   [facility], [address], [place], [place] [phone](HYIFO); [phone](fax) KittenExchange.[email or web address]  *Bring your own interpreter at first visit *Accepts Medicare and [facility]   [facility] Mon-Fri: 9am-5:30pm [address], [place], [place] [phone] (phone), [phone] (fax) After  hours crisis line: [phone] [email or web address]  *Accepts Medicare and Medicaid   [employer], 8am-6pm [address], [place], [place]  [phone] (phone);  [phone] (fax) [email or web address]://[email or web address]  *Subsidized costs available   [facility] Mon-Fri: 8am-4pm [address] [place], [place] [phone](GNFAO); [phone](fax) www.[email or web address]  *Accepts Medicaid   [facility] Same day access hours: Mon-Fri, 8:30-3pm Crisis hours: Mon-Fri, 8am-5pm [address] [place], [place] [phone]   [facility] Same day access hours: Mon-Fri, 8:30-3pm Crisis hours: Mon-Fri, 8am-8pm [address], [place], [place] [phone] (phone); [phone] (fax) www.[email or web address]  *Accepts Medicaid and Medicare   [facility], [place], Fri: 9am-9pm Tues, Thurs: 9am-6pm [address] [place], [place], [place]  [phone] (phone); [phone] (fax) [email or web address]  *(Accepts Medicare and Medicaid; payment plans available)*Bilingual services available   [facility] [address] [place], [place] [phone] (phone); [phone] (fax) [email or web address]    [facility] [address], [place], [place]  [phone]  [email or web address]  *Bilingual services available   [facility]) Mon-Thurs: 8am-8pm [address], [place], [place] [phone] (phone); [phone] (fax) ScrapbookLive.[email or web address]  *Accepts Medicaid*Bilingual services available   [facility] [address] [place], [place] [phone] (phone) [email or web address]  *Accepts Medicaid *Bilingual services available   [facility] Mon-Fri, 9am-4:45pm [address], [place], [place] [phone] (phone); [phone]  (fax) [email or web address]  *Accepts Medicare   [facility] Mon-Thurs: 8:30-8pm, Fri: 8:30am-7pm [address] [place], [place] (3rd floor) [phone] (phone); [phone] (fax) [email or web address]  *Accepts Medicaid; income-based reduced rates available   [facility] Mon-Fri: 8am-5pm [address], [place], [place] [address] [phone] (phone); [phone] (fax) [email or web address]://[email or web address]  *Accepts Medicaid*Bilingual services available     [facility] [place])  [address] [place] [phone] www.[email or web address]  *Provides direct services to individuals in recovery from mental illness, including support groups, recovery skills classes, and one on one peer support   [employer] on Mental Illness) [place] helpline: 727-435-4590  [employer] [place] helpline: (509)592-3540 https://namiguilford.org  *A community hub for information relating to local resources and services for the friends and families of individuals living alongside a mental health condition, as well as the individuals themselves. Classes and support groups also provided

## 2023-03-14 NOTE — Assessment & Plan Note (Signed)
 Hospital chart reviewed, including discharge summary Medications reconciled and reviewed with the patient in detail

## 2023-03-14 NOTE — Telephone Encounter (Signed)
Was this D/C today ? Please advise [facility]

## 2023-03-14 NOTE — ED Provider Notes (Signed)
[place] EMERGENCY DEPARTMENT AT [facility] Provider Note   CSN: [id number] Arrival date & time: 03/14/23  5956     History  Chief Complaint  Patient presents with   Fatigue    Crystal Bullock is a 58 y.o. female.  58 year old female with past medical history of hypertension and sarcoidosis presenting to the emergency department today with fatigue.  The patient was seen here last week for chest pain and was noted to have a low potassium.  She states that she has had decreased appetite since she is gotten home.  She denies any chest pain currently.  Denies any shortness of breath.  States that she went to her primary care doctor today and was subsequently sent to the ER for IV fluids.  The patient states that she has had decreased oral intake but denies any abdominal pain or vomiting.  She denies any new medication changes.  The symptoms have been going on for 1 week now.        Home Medications Prior to Admission medications   Medication Sig Start Date End Date Taking? Authorizing Provider  albuterol (PROVENTIL) (2.5 MG/3ML) 0.083% nebulizer solution USE 1 VIAL VIA NEBULIZER EVERY 6 HOURS AS NEEDED FOR WHEEZING OR SHORTNESS OF BREATH 01/21/23   Paseda, Baird Kay, FNP  albuterol (VENTOLIN HFA) 108 (90 Base) MCG/ACT inhaler INHALE 2 PUFFS INTO THE LUNGS EVERY 6 HOURS FOR UP TO 90 DOSES AS NEEDED FOR WHEEZING 08/12/22   Ivonne Sully Dyment, NP  amLODipine (NORVASC) 10 MG tablet TAKE 1 TABLET(10 MG) BY MOUTH DAILY 03/03/23   Ivonne Kyrra Prada, NP  amoxicillin-clavulanate (AUGMENTIN) 875-125 MG tablet Take 1 tablet by mouth 2 (two) times daily. Patient not taking: Reported on 03/14/2023 03/05/23   Ivonne Christop Hippert, NP  ascorbic acid (VITAMIN C) 500 MG tablet Take 1 tablet (500 mg total) by mouth daily. Patient not taking: Reported on 03/14/2023 01/29/23   Ivonne Donja Tipping, NP  baclofen (LIORESAL) 10 MG tablet Take 10 mg by mouth 2 (two) times daily as needed for muscle  spasms. Patient not taking: Reported on 03/14/2023 03/02/21   [provider]  benzonatate (TESSALON) 200 MG capsule Take 1 capsule (200 mg total) by mouth 3 (three) times daily as needed for cough. Patient not taking: Reported on 03/14/2023 03/03/23   Ivonne Ercil Cassis, NP  busPIRone (BUSPAR) 7.5 MG tablet Take 1 tablet (7.5 mg total) by mouth 3 (three) times daily. Patient not taking: Reported on 03/14/2023 08/24/18   Mike Gip, FNP  cetirizine (ZYRTEC) 10 MG tablet TAKE 1 TABLET BY MOUTH DAILY AS NEEDED FOR ALLERGIES 11/13/22   Ivonne Rosene Pilling, NP  clindamycin (CLINDAGEL) 1 % gel Apply topically 2 (two) times daily. Patient not taking: Reported on 03/14/2023 11/23/21   Ivonne Jamarri Vuncannon, NP  cyclobenzaprine (FLEXERIL) 10 MG tablet Take 1 tablet (10 mg total) by mouth 3 (three) times daily as needed for muscle spasms. Patient not taking: Reported on 03/14/2023 02/11/21   Fayrene Helper, PA-C  docusate sodium (COLACE) 100 MG capsule Take 1 capsule (100 mg total) by mouth 2 (two) times daily. 03/03/23   Ivonne Preet Perrier, NP  esomeprazole (NEXIUM) 40 MG capsule Take 1 capsule (40 mg total) by mouth daily. Patient not taking: Reported on 03/14/2023 01/29/23 04/29/23  Ivonne Sharee Sturdy, NP  fluticasone (FLONASE) 50 MCG/ACT nasal spray PLACE 2 SPRAYS INTO BOTH NOSTRILS DAILY AS NEEDED. ALLERGIES 11/21/22   Ivonne Ardine Iacovelli, NP  gabapentin (NEURONTIN) 300 MG capsule  Take 300 mg by mouth 3 (three) times daily. Patient not taking: Reported on 03/14/2023 10/28/22   [provider]  hydrochlorothiazide (HYDRODIURIL) 25 MG tablet Take 1 tablet (25 mg total) by mouth daily. 03/14/23   Donell Beers, FNP  hydrOXYzine (VISTARIL) 50 MG capsule Take as prescribed as AVS. Patient not taking: Reported on 03/14/2023 04/07/19   Barbette Merino, NP  Karlene Einstein 145 MCG CAPS capsule  12/25/22   [provider]  LINZESS 72 MCG capsule Take 1 capsule (72 mcg total) by mouth daily. Patient not taking:  Reported on 03/10/2023 10/22/21   Ivonne Kang Ishida, NP  meloxicam (MOBIC) 15 MG tablet Take 15 mg by mouth daily. Patient not taking: Reported on 03/14/2023 04/26/22   [provider]  methocarbamol (ROBAXIN) 500 MG tablet Take 1 tablet (500 mg total) by mouth every 6 (six) hours as needed for muscle spasms. Patient not taking: Reported on 03/14/2023 09/13/21   Arby Barrette, MD  methylPREDNISolone (MEDROL DOSEPAK) 4 MG TBPK tablet Please take as instructed on the packaging Patient not taking: Reported on 03/14/2023 01/27/23   Ivonne Silena Wyss, NP  Multiple Vitamins-Minerals (MULTIVITAMINS THER. W/MINERALS) TABS tablet Take 1 tablet by mouth daily. Patient not taking: Reported on 03/14/2023 03/03/23   Ivonne Vinod Mikesell, NP  NON FORMULARY CPAP at bedtime Patient not taking: Reported on 03/14/2023    [provider]  nystatin ointment (MYCOSTATIN) Apply 1 application. topically 2 (two) times daily. Patient not taking: Reported on 03/14/2023 07/03/21   Orion Crook I, NP  ondansetron (ZOFRAN) 4 MG tablet Take 1 tablet (4 mg total) by mouth every 8 (eight) hours as needed for nausea or vomiting. 01/29/23   Ivonne Krystina Strieter, NP  oxyCODONE-acetaminophen (PERCOCET) 10-325 MG tablet Take 1 tablet by mouth every 8 (eight) hours as needed for pain.    [provider]  potassium chloride SA (KLOR-CON M) 20 MEQ tablet Take 1 tablet (20 mEq total) by mouth 2 (two) times daily. 03/12/23   Ivonne Ozzie Knobel, NP  promethazine (PHENERGAN) 12.5 MG tablet Take 1 tablet (12.5 mg total) by mouth every 8 (eight) hours as needed for nausea or vomiting. Patient not taking: Reported on 03/14/2023 11/27/22   Ivonne Devika Dragovich, NP  Semaglutide,0.25 or 0.5MG /DOS, (OZEMPIC, 0.25 OR 0.5 MG/DOSE,) 2 MG/3ML SOPN INJECT 0.5 MG INTO THE SKIN ONE TIME PER WEEK Patient not taking: Reported on 03/14/2023 10/09/22   Ivonne Mandrell Vangilder, NP  sucralfate (CARAFATE) 1 g tablet Take 1 tablet (1 g total) by mouth 4 (four) times  daily as needed. Patient not taking: Reported on 03/14/2023 07/22/21   Sabas Sous, MD  Vitamin D, Ergocalciferol, (DRISDOL) 1.25 MG (50000 UNIT) CAPS capsule Take 1 capsule (50,000 Units total) by mouth every 7 (seven) days. 11/13/22   Ivonne Lennox Leikam, NP      Allergies    Bee venom and Iodinated contrast media    Review of Systems   Review of Systems  Constitutional:  Positive for fatigue.  All other systems reviewed and are negative.   Physical Exam Updated Vital Signs BP 121/79 (BP Location: Left Arm)   Pulse 70   Temp 98.8 F (37.1 C) (Oral)   Resp 18   Ht 5\' 7"  (1.702 m)   Wt 114 kg   LMP 08/24/2015 Comment: irregular periods per pt 09/12/15  SpO2 96%   BMI 39.36 kg/m  Physical Exam Vitals and nursing note reviewed.   Gen: NAD Eyes: PERRL, EOMI HEENT:  no oropharyngeal swelling Neck: trachea midline Resp: clear to auscultation bilaterally Card: RRR, no murmurs, rubs, or gallops Abd: nontender, nondistended Extremities: no calf tenderness, no edema Vascular: 2+ radial pulses bilaterally, 2+ DP pulses bilaterally Neuro: No focal deficits Skin: no rashes Psyc: acting appropriately   ED Results / Procedures / Treatments   Labs (all labs ordered are listed, but only abnormal results are displayed) Labs Reviewed  CBC - Abnormal; Notable for the following components:      Result Value   MCV 72.8 (*)    MCH 24.9 (*)    All other components within normal limits  BASIC METABOLIC PANEL - Abnormal; Notable for the following components:   Glucose, Bld 129 (*)    Creatinine, Ser 1.05 (*)    All other components within normal limits  CBG MONITORING, ED - Abnormal; Notable for the following components:   Glucose-Capillary 121 (*)    All other components within normal limits  MAGNESIUM    EKG EKG Interpretation Date/Time:  Friday March 14 2023 10:32:34 EST Ventricular Rate:  76 PR Interval:  167 QRS Duration:  160 QT Interval:  458 QTC Calculation: 515 R  Axis:   83  Text Interpretation: Sinus rhythm Right bundle branch block Baseline wander in lead(s) V1 Confirmed by Beckey Downing [phone]) on 03/14/2023 10:40:45 AM  Radiology No results found.  Procedures Procedures    Medications Ordered in ED Medications  sodium chloride 0.9 % bolus 1,000 mL (1,000 mLs Intravenous New Bag/Given 03/14/23 1038)    ED Course/ Medical Decision Making/ A&P                                 Medical Decision Making 58 year old female with past medical history of hypertension and sarcoidosis presents the emergency department today with fatigue and decreased oral intake.  Given the duration of symptoms we will hold off on any viral testing.  Will obtain basic labs here to evaluate for anemia or electrolyte abnormalities.  Will obtain a magnesium level and potassium level to evaluate for hypomagnesemia or hypokalemia.  Will give the patient IV fluids and reevaluate for ultimate disposition.  The patient's EKG interpreted by me shows sinus rhythm with right bundle branch block and no significant ST-T changes.  Appears similar morphology to previous EKGs.  The patient's labs here are reviewed during.  She given IV fluids and is discharged with return precautions.  Amount and/or Complexity of Data Reviewed Labs: ordered.           Final Clinical Impression(s) / ED Diagnoses Final diagnoses:  Other fatigue    Rx / DC Orders ED Discharge Orders     None         Durwin Glaze, MD 03/14/23 1141

## 2023-03-14 NOTE — Assessment & Plan Note (Signed)
Has Ozempic ordered but she is not taking the medication Checking A1c

## 2023-03-14 NOTE — Progress Notes (Signed)
Established Patient Office Visit  Subjective:  Patient ID: Crystal Bullock, female    DOB: 02-07-1966  Age: 58 y.o. MRN: [id number]  CC:  Chief Complaint  Patient presents with   Hospitalization Follow-up    Potassium low  Pain mang gave her new b/p med. Still not feeling well    HPI Crystal Bullock is a 58 y.o. female  has a past medical history of Anxiety (10/06/2015), Asthma, Atypical chest pain (10/06/2015), Chronic neck pain, Fibroid, Hypertension, Palpitations (10/06/2015), RBBB (10/06/2015), Sarcoidosis, Scoliosis, Seasonal allergies, Sickle cell trait (HCC), Type 2 diabetes mellitus without complication, without long-term current use of insulin (HCC) (03/14/2023), and Vitamin D deficiency (05/2019).   Patient presents for ER follow-up.  Patient was at the emergency department on 03/09/2023 for costochondral chest pain.  BMP revealed mild hypokalemia, potassium was replenished orally with 40 mEq of potassium chloride while at the hospital.  She was recommended and states for her chest pain.  Patient stated that her chest pain has resolved but she feels tired and has low energy.  Hypertension.  Currently on hydrochlorothiazide 25 mg daily amlodipine 10 mg daily.  She denies chest pain shortness of breath, edema.  She would like to switch hydrochlorothiazide to Hygroton, stated that she was on this medication in the past, not sure why the medication was discontinued. . Depression.  States that she has a lot going on now her daughter is incarcerated and she has been having to raise her 3 grandkids, stated that her daughter comes home in 2 weeks.  She is not interested in medication but would like counseling.  She denies SI, HI  Patient stated she feels dehydrated and would like to be given normal saline infusion but in the hospital cannot accommodate her today.  She was encouraged to drink at least 64 ounces of water daily and go to the emergency room if needed.   Past Medical  History:  Diagnosis Date   Anxiety 10/06/2015   Asthma    Atypical chest pain 10/06/2015   Chronic neck pain    Fibroid    Hypertension    Palpitations 10/06/2015   RBBB 10/06/2015   Sarcoidosis    Scoliosis    Seasonal allergies    Sickle cell trait (HCC)    Type 2 diabetes mellitus without complication, without long-term current use of insulin (HCC) 03/14/2023   Vitamin D deficiency 05/2019    Past Surgical History:  Procedure Laterality Date   AXILLARY LYMPH NODE DISSECTION     CHOLECYSTECTOMY      Family History  Problem Relation Age of Onset   Hypertension Mother    Diabetes Mother    Breast cancer Neg Hx     Social History   Socioeconomic History   Marital status: Single    Spouse name: Not on file   Number of children: Not on file   Years of education: Not on file   Highest education level: 12th grade  Occupational History   Not on file  Tobacco Use   Smoking status: Never   Smokeless tobacco: Never  Vaping Use   Vaping status: Never Used  Substance and Sexual Activity   Alcohol use: Yes    Alcohol/week: 1.0 standard drink of alcohol    Types: 1 Glasses of wine per week    Comment: occasional/holidays   Drug use: Not Currently    Types: Marijuana    Comment: once a week   Sexual activity: Yes    Birth  control/protection: Condom  Other Topics Concern   Not on file  Social History Narrative   Not on file   Social Drivers of Health   Financial Resource Strain: Medium Risk (07/27/2022)   Overall Financial Resource Strain (CARDIA)    Difficulty of Paying Living Expenses: Somewhat hard  Food Insecurity: No Food Insecurity (03/10/2023)   Hunger Vital Sign    Worried About Running Out of Food in the Last Year: Never true    Ran Out of Food in the Last Year: Never true  Transportation Needs: No Transportation Needs (03/10/2023)   PRAPARE - Administrator, Civil Service (Medical): No    Lack of Transportation (Non-Medical): No  Physical  Activity: Insufficiently Active (07/27/2022)   Exercise Vital Sign    Days of Exercise per Week: 2 days    Minutes of Exercise per Session: 30 min  Stress: No Stress Concern Present (07/27/2022)   [employer] of Occupational Health - Occupational Stress Questionnaire    Feeling of Stress : Only a little  Social Connections: Unknown (07/27/2022)   Social Connection and Isolation Panel [NHANES]    Frequency of Communication with Friends and Family: More than three times a week    Frequency of Social Gatherings with Friends and Family: Twice a week    Attends Religious Services: More than 4 times per year    Active Member of [employer] or Organizations: Yes    Attends Banker Meetings: More than 4 times per year    Marital Status: Patient declined  Intimate Partner Violence: Not At Risk (03/10/2023)   Humiliation, Afraid, Rape, and Kick questionnaire    Fear of Current or Ex-Partner: No    Emotionally Abused: No    Physically Abused: No    Sexually Abused: No    Outpatient Medications Prior to Visit  Medication Sig Dispense Refill   albuterol (PROVENTIL) (2.5 MG/3ML) 0.083% nebulizer solution USE 1 VIAL VIA NEBULIZER EVERY 6 HOURS AS NEEDED FOR WHEEZING OR SHORTNESS OF BREATH 75 mL 1   albuterol (VENTOLIN HFA) 108 (90 Base) MCG/ACT inhaler INHALE 2 PUFFS INTO THE LUNGS EVERY 6 HOURS FOR UP TO 90 DOSES AS NEEDED FOR WHEEZING 6.7 g 1   amLODipine (NORVASC) 10 MG tablet TAKE 1 TABLET(10 MG) BY MOUTH DAILY 90 tablet 0   cetirizine (ZYRTEC) 10 MG tablet TAKE 1 TABLET BY MOUTH DAILY AS NEEDED FOR ALLERGIES 30 tablet 2   docusate sodium (COLACE) 100 MG capsule Take 1 capsule (100 mg total) by mouth 2 (two) times daily. 10 capsule 0   fluticasone (FLONASE) 50 MCG/ACT nasal spray PLACE 2 SPRAYS INTO BOTH NOSTRILS DAILY AS NEEDED. ALLERGIES 48 mL 3   ondansetron (ZOFRAN) 4 MG tablet Take 1 tablet (4 mg total) by mouth every 8 (eight) hours as needed for nausea or vomiting. 20 tablet 0    oxyCODONE-acetaminophen (PERCOCET) 10-325 MG tablet Take 1 tablet by mouth every 8 (eight) hours as needed for pain.     potassium chloride SA (KLOR-CON M) 20 MEQ tablet Take 1 tablet (20 mEq total) by mouth 2 (two) times daily. 5 tablet 0   Vitamin D, Ergocalciferol, (DRISDOL) 1.25 MG (50000 UNIT) CAPS capsule Take 1 capsule (50,000 Units total) by mouth every 7 (seven) days. 5 capsule 6   hydrochlorothiazide (HYDRODIURIL) 25 MG tablet Take 1 tablet (25 mg total) by mouth daily. 90 tablet 2   amoxicillin-clavulanate (AUGMENTIN) 875-125 MG tablet Take 1 tablet by mouth 2 (two) times daily. (Patient  not taking: Reported on 03/14/2023) 20 tablet 0   ascorbic acid (VITAMIN C) 500 MG tablet Take 1 tablet (500 mg total) by mouth daily. (Patient not taking: Reported on 03/14/2023) 100 tablet 0   baclofen (LIORESAL) 10 MG tablet Take 10 mg by mouth 2 (two) times daily as needed for muscle spasms. (Patient not taking: Reported on 03/14/2023)     benzonatate (TESSALON) 200 MG capsule Take 1 capsule (200 mg total) by mouth 3 (three) times daily as needed for cough. (Patient not taking: Reported on 03/14/2023) 20 capsule 0   busPIRone (BUSPAR) 7.5 MG tablet Take 1 tablet (7.5 mg total) by mouth 3 (three) times daily. (Patient not taking: Reported on 03/14/2023) 60 tablet 2   clindamycin (CLINDAGEL) 1 % gel Apply topically 2 (two) times daily. (Patient not taking: Reported on 03/14/2023) 30 g 0   cyclobenzaprine (FLEXERIL) 10 MG tablet Take 1 tablet (10 mg total) by mouth 3 (three) times daily as needed for muscle spasms. (Patient not taking: Reported on 03/14/2023) 20 tablet 0   esomeprazole (NEXIUM) 40 MG capsule Take 1 capsule (40 mg total) by mouth daily. (Patient not taking: Reported on 03/14/2023) 90 capsule 0   gabapentin (NEURONTIN) 300 MG capsule Take 300 mg by mouth 3 (three) times daily. (Patient not taking: Reported on 03/14/2023)     hydrOXYzine (VISTARIL) 50 MG capsule Take as prescribed as AVS. (Patient not  taking: Reported on 03/14/2023) 120 capsule 1   LINZESS 145 MCG CAPS capsule  (Patient not taking: Reported on 03/14/2023)     LINZESS 72 MCG capsule Take 1 capsule (72 mcg total) by mouth daily. (Patient not taking: Reported on 03/10/2023) 30 capsule 0   meloxicam (MOBIC) 15 MG tablet Take 15 mg by mouth daily. (Patient not taking: Reported on 03/14/2023)     methocarbamol (ROBAXIN) 500 MG tablet Take 1 tablet (500 mg total) by mouth every 6 (six) hours as needed for muscle spasms. (Patient not taking: Reported on 03/14/2023) 20 tablet 0   methylPREDNISolone (MEDROL DOSEPAK) 4 MG TBPK tablet Please take as instructed on the packaging (Patient not taking: Reported on 03/14/2023) 1 each 0   Multiple Vitamins-Minerals (MULTIVITAMINS THER. W/MINERALS) TABS tablet Take 1 tablet by mouth daily. (Patient not taking: Reported on 03/14/2023) 30 tablet 0   NON FORMULARY CPAP at bedtime (Patient not taking: Reported on 03/14/2023)     nystatin ointment (MYCOSTATIN) Apply 1 application. topically 2 (two) times daily. (Patient not taking: Reported on 03/14/2023) 30 g 1   promethazine (PHENERGAN) 12.5 MG tablet Take 1 tablet (12.5 mg total) by mouth every 8 (eight) hours as needed for nausea or vomiting. (Patient not taking: Reported on 03/14/2023) 20 tablet 0   Semaglutide,0.25 or 0.5MG /DOS, (OZEMPIC, 0.25 OR 0.5 MG/DOSE,) 2 MG/3ML SOPN INJECT 0.5 MG INTO THE SKIN ONE TIME PER WEEK (Patient not taking: Reported on 03/14/2023) 3 mL 1   sucralfate (CARAFATE) 1 g tablet Take 1 tablet (1 g total) by mouth 4 (four) times daily as needed. (Patient not taking: Reported on 03/14/2023) 30 tablet 0   No facility-administered medications prior to visit.    Allergies  Allergen Reactions   Bee Venom Anaphylaxis   Iodinated Contrast Media Rash    Hives on hands and feet per pt 08/16/15 pt had scan w/ contrast and had a 4 hr premedication protocol done    ROS Review of Systems  Constitutional:  Positive for fatigue. Negative for  appetite change, chills and fever.  HENT:  Negative for congestion, postnasal drip, rhinorrhea and sneezing.   Respiratory:  Negative for cough, shortness of breath and wheezing.   Cardiovascular:  Negative for chest pain, palpitations and leg swelling.  Gastrointestinal:  Negative for abdominal pain, constipation, nausea and vomiting.  Genitourinary:  Negative for difficulty urinating, dysuria, flank pain and frequency.  Musculoskeletal:  Negative for arthralgias, back pain, joint swelling and myalgias.  Skin:  Negative for color change, pallor, rash and wound.  Neurological:  Negative for dizziness, facial asymmetry, weakness, numbness and headaches.  Psychiatric/Behavioral:  Negative for behavioral problems, confusion, self-injury and suicidal ideas.       Objective:    Physical Exam Vitals and nursing note reviewed.  Constitutional:      General: She is not in acute distress.    Appearance: Normal appearance. She is obese. She is not ill-appearing, toxic-appearing or diaphoretic.  HENT:     Mouth/Throat:     Mouth: Mucous membranes are moist.     Pharynx: Oropharynx is clear. No oropharyngeal exudate or posterior oropharyngeal erythema.  Eyes:     General: No scleral icterus.       Right eye: No discharge.        Left eye: No discharge.     Extraocular Movements: Extraocular movements intact.     Conjunctiva/sclera: Conjunctivae normal.  Cardiovascular:     Rate and Rhythm: Normal rate and regular rhythm.     Pulses: Normal pulses.     Heart sounds: Normal heart sounds. No murmur heard.    No friction rub. No gallop.  Pulmonary:     Effort: Pulmonary effort is normal. No respiratory distress.     Breath sounds: Normal breath sounds. No stridor. No wheezing, rhonchi or rales.  Chest:     Chest wall: No tenderness.  Abdominal:     General: There is no distension.     Palpations: Abdomen is soft.     Tenderness: There is no abdominal tenderness. There is no right CVA  tenderness, left CVA tenderness or guarding.  Musculoskeletal:        General: No swelling, tenderness, deformity or signs of injury.     Right lower leg: No edema.     Left lower leg: No edema.  Skin:    General: Skin is warm and dry.     Capillary Refill: Capillary refill takes 2 to 3 seconds.     Coloration: Skin is not jaundiced or pale.     Findings: No bruising, erythema or lesion.  Neurological:     Mental Status: She is alert and oriented to person, place, and time.     Motor: No weakness.     Coordination: Coordination normal.     Gait: Gait normal.  Psychiatric:        Mood and Affect: Mood normal.        Behavior: Behavior normal.        Thought Content: Thought content normal.        Judgment: Judgment normal.     BP 119/72   Pulse 80   Temp (!) 97.5 F (36.4 C)   Wt 252 lb (114.3 kg)   LMP 08/24/2015 Comment: irregular periods per pt 09/12/15  SpO2 97%   BMI 39.47 kg/m  Wt Readings from Last 3 Encounters:  03/14/23 251 lb 5.2 oz (114 kg)  03/14/23 252 lb (114.3 kg)  03/09/23 252 lb (114.3 kg)    Lab Results  Component Value Date   TSH 0.866 06/08/2019  Lab Results  Component Value Date   WBC 10.3 03/14/2023   HGB 12.5 03/14/2023   HCT 36.6 03/14/2023   MCV 72.8 (L) 03/14/2023   PLT 286 03/14/2023   Lab Results  Component Value Date   NA 138 03/14/2023   K 3.5 03/14/2023   CO2 30 03/14/2023   GLUCOSE 129 (H) 03/14/2023   BUN 11 03/14/2023   CREATININE 1.05 (H) 03/14/2023   BILITOT 0.5 12/13/2022   ALKPHOS 51 12/13/2022   AST 20 12/13/2022   ALT 22 12/13/2022   PROT 7.3 12/13/2022   ALBUMIN 3.3 (L) 12/13/2022   CALCIUM 9.6 03/14/2023   ANIONGAP 9 03/14/2023   EGFR 67 01/21/2023   Lab Results  Component Value Date   CHOL 148 08/12/2022   Lab Results  Component Value Date   HDL 37 (L) 08/12/2022   Lab Results  Component Value Date   LDLCALC 75 08/12/2022   Lab Results  Component Value Date   TRIG 214 (H) 08/12/2022   Lab  Results  Component Value Date   CHOLHDL 4.0 08/12/2022   Lab Results  Component Value Date   HGBA1C 6.6 (A) 11/13/2022      Assessment & Plan:   Problem List Items Addressed This Visit       Cardiovascular and Mediastinum   Essential hypertension   BP Readings from Last 3 Encounters:  03/14/23 130/84  03/14/23 119/72  03/09/23 131/80  The patient would like to have hydrochlorothiazide discontinued and Hygroton ordered.  I discussed with the patient that this medication can also cause hypokalemia. I wanted to start her on spironolactone which she declined.  Her PCP had consulted the clinical pharmacist and they plan on following up with her       Relevant Medications   hydrochlorothiazide (HYDRODIURIL) 25 MG tablet   Other Relevant Orders   CMP14+EGFR     Endocrine   Type 2 diabetes mellitus without complication, without long-term current use of insulin (HCC)   Has Ozempic ordered but she is not taking the medication Checking A1c      Relevant Orders   Hemoglobin A1c     Other   Hypokalemia - Primary   Potassium 20 mEq twice daily was recently ordered by the PCP Stated that she has completed the medication Checking CMP      Relevant Orders   CMP14+EGFR   Costochondral chest pain   Now resolved      Anxiety and depression   Flowsheet Row Office Visit from 03/14/2023 in [facility]  PHQ-9 Total Score 21     List of therapist in this area provided Encouraged to seek care at [facility] behavioral health care clinic if needed for crisis She currently denies SI, HI      Encounter for examination following treatment at hospital   RBC microcytosis   Lab Results  Component Value Date   WBC 10.3 03/14/2023   HGB 12.5 03/14/2023   HCT 36.6 03/14/2023   MCV 72.8 (L) 03/14/2023   PLT 286 03/14/2023          Meds ordered this encounter  Medications   DISCONTD: chlorthalidone (HYGROTON) 25 MG  tablet    Sig: Take 0.5 tablets (12.5 mg total) by mouth daily.    Dispense:  30 tablet    Refill:  1   hydrochlorothiazide (HYDRODIURIL) 25 MG tablet    Sig: Take 1 tablet (25 mg total)  by mouth daily.    Dispense:  90 tablet    Refill:  2    Follow-up: Return in about 4 weeks (around 04/11/2023) for HTN.    Donell Beers, FNP

## 2023-03-14 NOTE — Assessment & Plan Note (Signed)
 Now resolved.

## 2023-03-14 NOTE — ED Triage Notes (Signed)
States was at [facility] and sent here as is having weakness and tiredness.  Dizziness.  Ambulatory to room without difficulty. Had blood work done.  States needs fluids. States went to [facility] ER Sunday for chest pain and had low potassium

## 2023-03-14 NOTE — Telephone Encounter (Signed)
Script loaded for authorization, routing to provider for review.  Copied from CRM [id number]. Topic: Clinical - Prescription Issue >> Mar 14, 2023 11:37 AM Louie Casa B wrote: Reason for CRM: patient is cal;ling because her blood pressure rx was canceled and she wants to speak to someone about it chlorchalivone 25 mg please call patient back [phone]

## 2023-03-14 NOTE — Assessment & Plan Note (Signed)
Lab Results  Component Value Date   WBC 10.3 03/14/2023   HGB 12.5 03/14/2023   HCT 36.6 03/14/2023   MCV 72.8 (L) 03/14/2023   PLT 286 03/14/2023

## 2023-03-14 NOTE — Assessment & Plan Note (Signed)
BP Readings from Last 3 Encounters:  03/14/23 130/84  03/14/23 119/72  03/09/23 131/80  The patient would like to have hydrochlorothiazide discontinued and Hygroton ordered.  I discussed with the patient that this medication can also cause hypokalemia. I wanted to start her on spironolactone which she declined.  Her PCP had consulted the clinical pharmacist and they plan on following up with her

## 2023-03-14 NOTE — Discharge Instructions (Addendum)
Your labs today were reassuring.  Please call and schedule a follow-up appoint with your primary care doctor to discuss further workup on an outpatient setting.  Please return to the ER for worsening symptoms.

## 2023-03-14 NOTE — Assessment & Plan Note (Signed)
Potassium 20 mEq twice daily was recently ordered by the PCP Stated that she has completed the medication Checking CMP

## 2023-03-15 LAB — CMP14+EGFR
ALT: 23 [IU]/L (ref 0–32)
AST: 21 [IU]/L (ref 0–40)
Albumin: 4 g/dL (ref 3.8–4.9)
Alkaline Phosphatase: 55 [IU]/L (ref 44–121)
BUN/Creatinine Ratio: 9 (ref 9–23)
BUN: 10 mg/dL (ref 6–24)
Bilirubin Total: 0.3 mg/dL (ref 0.0–1.2)
CO2: 27 mmol/L (ref 20–29)
Calcium: 9.6 mg/dL (ref 8.7–10.2)
Chloride: 98 mmol/L (ref 96–106)
Creatinine, Ser: 1.1 mg/dL — ABNORMAL HIGH (ref 0.57–1.00)
Globulin, Total: 3.2 g/dL (ref 1.5–4.5)
Glucose: 137 mg/dL — ABNORMAL HIGH (ref 70–99)
Potassium: 3.9 mmol/L (ref 3.5–5.2)
Sodium: 141 mmol/L (ref 134–144)
Total Protein: 7.2 g/dL (ref 6.0–8.5)
eGFR: 59 mL/min/{1.73_m2} — ABNORMAL LOW (ref >59)

## 2023-03-15 LAB — HEMOGLOBIN A1C
Est. average glucose Bld gHb Est-mCnc: 171 mg/dL
Hgb A1c MFr Bld: 7.6 % — ABNORMAL HIGH (ref 4.8–5.6)

## 2023-03-15 LAB — IRON,TIBC AND FERRITIN PANEL
Ferritin: 123 ng/mL (ref 15–150)
Iron Saturation: 19 % (ref 15–55)
Iron: 51 ug/dL (ref 27–159)
Total Iron Binding Capacity: 262 ug/dL (ref 250–450)
UIBC: 211 ug/dL (ref 131–425)

## 2023-03-15 LAB — CBC
Hematocrit: 39.5 % (ref 34.0–46.6)
Hemoglobin: 12.9 g/dL (ref 11.1–15.9)
MCH: 25.2 pg — ABNORMAL LOW (ref 26.6–33.0)
MCHC: 32.7 g/dL (ref 31.5–35.7)
MCV: 77 fL — ABNORMAL LOW (ref 79–97)
Platelets: 306 10*3/uL (ref 150–450)
RBC: 5.12 x10E6/uL (ref 3.77–5.28)
RDW: 14.7 % (ref 11.7–15.4)
WBC: 11.6 10*3/uL — ABNORMAL HIGH (ref 3.4–10.8)

## 2023-03-17 ENCOUNTER — Telehealth: Payer: Self-pay | Admitting: Nurse Practitioner

## 2023-03-17 ENCOUNTER — Telehealth: Payer: Self-pay

## 2023-03-17 ENCOUNTER — Other Ambulatory Visit: Payer: Self-pay

## 2023-03-17 DIAGNOSIS — K219 Gastro-esophageal reflux disease without esophagitis: Secondary | ICD-10-CM

## 2023-03-17 DIAGNOSIS — Z79899 Other long term (current) drug therapy: Secondary | ICD-10-CM

## 2023-03-17 MED ORDER — ESOMEPRAZOLE MAGNESIUM 40 MG PO CPDR: 40.0000 mg | DELAYED_RELEASE_CAPSULE | Freq: Every day | ORAL | 0 refills | Status: DC

## 2023-03-17 NOTE — Progress Notes (Signed)
   03/17/2023  Patient ID: Crystal Bullock, female   DOB: [birth year], 58 y.o.   MRN: [id number]  Attempted to contact patient for medication management/review. Left HIPAA compliant message for patient to return my call at their convenience.   First attempt for patient outreach. Will follow up with patient in 2-4 business days.  Thank you for allowing pharmacy to be a part of this patient's care.  Cephus Shelling, PharmD Clinical Pharmacist Cell: [phone]

## 2023-03-17 NOTE — Telephone Encounter (Signed)
Copied from CRM [id number]. Topic: Clinical - Medication Refill >> Mar 17, 2023 10:56 AM Payton Doughty wrote: Most Recent Primary Care Visit:  Provider: Donell Beers  Department: [facility]  Visit Type: OFFICE VISIT  Date: 03/14/2023  Medication: Chlorthalidone 25 mg  Has the patient contacted their pharmacy? Yes (Agent: If no, request that the patient contact the pharmacy for the refill. If patient does not wish to contact the pharmacy document the reason why and proceed with request.) (Agent: If yes, when and what did the pharmacy advise?)  Is this the correct pharmacy for this prescription? Yes If no, delete pharmacy and type the correct one.  This is the patient's preferred pharmacy:  [id number] - [place], [place] - [address] AT [address] [facility] [address] [place] [address] Phone: [phone] Fax: [phone]   Has the prescription been filled recently? No  Is the patient out of the medication? Yes  Has the patient been seen for an appointment in the last year OR does the patient have an upcoming appointment? Yes  Can we respond through MyChart? Yes  Agent: Please be advised that Rx refills may take up to 3 business days. We ask that you follow-up with your pharmacy.

## 2023-03-17 NOTE — Transitions of Care (Post Inpatient/ED Visit) (Unsigned)
03/17/2023  Name: Crystal Bullock MRN: [id number] DOB: Jul 28, 1965  Today's TOC FU Call Status:   Patient's Name and Date of Birth confirmed.  Transition Care Management Follow-up Telephone Call Discharge Facility: [facility] ([facility] Type of Discharge: Emergency Department Reason for ED Visit: Other: How have you been since you were released from the hospital?: Better Any questions or concerns?: No  Items Reviewed: Did you receive and understand the discharge instructions provided?: Yes Medications obtained,verified, and reconciled?: Yes (Medications Reviewed) Any new allergies since your discharge?: No Dietary orders reviewed?: No Do you have support at home?: Yes People in Home: child(ren), adult  Medications Reviewed Today: Medications Reviewed Today     Reviewed by Veneta Penton, CMA (Certified Medical Assistant) on 03/17/23 at 1003  Med List Status: <None>   Medication Order Taking? Sig Documenting Provider Last Dose Status Informant  albuterol (PROVENTIL) (2.5 MG/3ML) 0.083% nebulizer solution [id number] Yes USE 1 VIAL VIA NEBULIZER EVERY 6 HOURS AS NEEDED FOR WHEEZING OR SHORTNESS OF BREATH Paseda, Baird Kay, FNP Taking Active   albuterol (VENTOLIN HFA) 108 (90 Base) MCG/ACT inhaler [id number] Yes INHALE 2 PUFFS INTO THE LUNGS EVERY 6 HOURS FOR UP TO 90 DOSES AS NEEDED FOR WHEEZING Ivonne Andrew, NP Taking Active   amLODipine (NORVASC) 10 MG tablet [id number] Yes TAKE 1 TABLET(10 MG) BY MOUTH DAILY Ivonne Andrew, NP Taking Active   amoxicillin-clavulanate (AUGMENTIN) 875-125 MG tablet [id number] No Take 1 tablet by mouth 2 (two) times daily.  Patient not taking: Reported on 03/10/2023   Ivonne Andrew, NP Not Taking Active   ascorbic acid (VITAMIN C) 500 MG tablet [id number] Yes Take 1 tablet (500 mg total) by mouth daily. Ivonne Andrew, NP Taking Active   baclofen (LIORESAL) 10 MG tablet [id number] No Take 10 mg by mouth 2 (two) times daily as needed  for muscle spasms.  Patient not taking: Reported on 03/14/2023   [provider] Not Taking Active Self  benzonatate (TESSALON) 200 MG capsule [id number] No Take 1 capsule (200 mg total) by mouth 3 (three) times daily as needed for cough.  Patient not taking: Reported on 03/10/2023   Ivonne Andrew, NP Not Taking Active   busPIRone (BUSPAR) 7.5 MG tablet [id number] No Take 1 tablet (7.5 mg total) by mouth 3 (three) times daily.  Patient not taking: Reported on 03/14/2023   Mike Gip, FNP Not Taking Active Self           Med Note Margo Aye, Tracie Harrier Jul 22, 2021  3:52 AM)    cetirizine (ZYRTEC) 10 MG tablet [id number] Yes TAKE 1 TABLET BY MOUTH DAILY AS NEEDED FOR ALLERGIES Ivonne Andrew, NP Taking Active   clindamycin (CLINDAGEL) 1 % gel [id number] No Apply topically 2 (two) times daily.  Patient not taking: Reported on 03/14/2023   Ivonne Andrew, NP Not Taking Active Self  cyclobenzaprine (FLEXERIL) 10 MG tablet [id number] No Take 1 tablet (10 mg total) by mouth 3 (three) times daily as needed for muscle spasms.  Patient not taking: Reported on 03/14/2023   Fayrene Helper, PA-C Not Taking Active Self  docusate sodium (COLACE) 100 MG capsule [id number] Yes Take 1 capsule (100 mg total) by mouth 2 (two) times daily. Ivonne Andrew, NP Taking Active   esomeprazole (NEXIUM) 40 MG capsule [id number] Yes Take 1 capsule (40 mg total) by mouth daily. Ivonne Andrew, NP Taking Active   fluticasone [facility]) 50 MCG/ACT nasal spray [id number] Yes PLACE  2 SPRAYS INTO BOTH NOSTRILS DAILY AS NEEDED. ALLERGIES Ivonne Andrew, NP Taking Active   gabapentin (NEURONTIN) 300 MG capsule [id number] No Take 300 mg by mouth 3 (three) times daily.  Patient not taking: Reported on 03/17/2023   [provider] Not Taking Active   hydrochlorothiazide (HYDRODIURIL) 25 MG tablet [id number] No Take 1 tablet (25 mg total) by mouth daily.  Patient not taking: Reported on 03/17/2023   Donell Beers,  FNP Not Taking Active   hydrOXYzine (VISTARIL) 50 MG capsule [id number] No Take as prescribed as AVS.  Patient not taking: Reported on 03/14/2023   Barbette Merino, NP Not Taking Active Self  LINZESS 145 MCG CAPS capsule [id number] Yes  [provider] Taking Active   LINZESS 72 MCG capsule [id number] No Take 1 capsule (72 mcg total) by mouth daily.  Patient not taking: Reported on 03/10/2023   Ivonne Andrew, NP Not Taking Active Self  meloxicam (MOBIC) 15 MG tablet [id number] No Take 15 mg by mouth daily.  Patient not taking: Reported on 03/17/2023   [provider] Not Taking Active   methocarbamol (ROBAXIN) 500 MG tablet [id number] No Take 1 tablet (500 mg total) by mouth every 6 (six) hours as needed for muscle spasms.  Patient not taking: Reported on 03/14/2023   Arby Barrette, MD Not Taking Active Self  methylPREDNISolone (MEDROL DOSEPAK) 4 MG TBPK tablet [id number] No Please take as instructed on the packaging  Patient not taking: Reported on 03/17/2023   Ivonne Andrew, NP Not Taking Active   Multiple Vitamins-Minerals (MULTIVITAMINS THER. W/MINERALS) TABS tablet [id number] No Take 1 tablet by mouth daily.  Patient not taking: Reported on 03/17/2023   Ivonne Andrew, NP Not Taking Active   NON FORMULARY [id number] No CPAP at bedtime  Patient not taking: Reported on 03/14/2023   [provider] Not Taking Active Self  nystatin ointment (MYCOSTATIN) [id number] No Apply 1 application. topically 2 (two) times daily.  Patient not taking: Reported on 03/14/2023   Orion Crook I, NP Not Taking Active Self  ondansetron (ZOFRAN) 4 MG tablet [id number] No Take 1 tablet (4 mg total) by mouth every 8 (eight) hours as needed for nausea or vomiting.  Patient not taking: Reported on 03/17/2023   Ivonne Andrew, NP Not Taking Active   oxyCODONE-acetaminophen (PERCOCET) 10-325 MG tablet [id number] Yes Take 1 tablet by mouth every 8 (eight) hours as needed for pain. [provider] Taking Active Self           Med Note Margo Aye, Tracie Harrier Jul 22, 2021  3:47 AM)    potassium chloride SA (KLOR-CON M) 20 MEQ tablet [id number] Yes Take 1 tablet (20 mEq total) by mouth 2 (two) times daily. Ivonne Andrew, NP Taking Active   promethazine (PHENERGAN) 12.5 MG tablet [id number] No Take 1 tablet (12.5 mg total) by mouth every 8 (eight) hours as needed for nausea or vomiting.  Patient not taking: Reported on 03/17/2023   Ivonne Andrew, NP Not Taking Active   Semaglutide,0.25 or 0.5MG /DOS, (OZEMPIC, 0.25 OR 0.5 MG/DOSE,) 2 MG/3ML SOPN [id number] No INJECT 0.5 MG INTO THE SKIN ONE TIME PER WEEK  Patient not taking: Reported on 03/17/2023   Ivonne Andrew, NP Not Taking Active   sucralfate (CARAFATE) 1 g tablet [id number] No Take 1 tablet (1 g total) by mouth 4 (four) times daily as needed.  Patient not taking: Reported on 03/14/2023   Sabas Sous,  MD Not Taking Active Self  Vitamin D, Ergocalciferol, (DRISDOL) 1.25 MG (50000 UNIT) CAPS capsule [id number] Yes Take 1 capsule (50,000 Units total) by mouth every 7 (seven) days. Ivonne Andrew, NP Taking Active             Home Care and Equipment/Supplies: Were Home Health Services Ordered?: No Any new equipment or medical supplies ordered?: No  Functional Questionnaire: Do you need assistance with bathing/showering or dressing?: No Do you need assistance with meal preparation?: No Do you need assistance with eating?: No Do you have difficulty maintaining continence: No Do you need assistance with getting out of bed/getting out of a chair/moving?: No Do you have difficulty managing or taking your medications?: No  Follow up appointments reviewed: PCP Follow-up appointment confirmed?: Yes Specialist Hospital Follow-up appointment confirmed?: No Do you need transportation to your follow-up appointment?: No Do you understand care options if your condition(s) worsen?: Yes-patient verbalized  understanding    SIGNATURE Dena Esperanza, CMA

## 2023-03-19 ENCOUNTER — Other Ambulatory Visit: Payer: Self-pay | Admitting: Nurse Practitioner

## 2023-03-19 ENCOUNTER — Telehealth: Payer: Self-pay

## 2023-03-19 MED ORDER — CHLORTHALIDONE 25 MG PO TABS: 25.0000 mg | ORAL_TABLET | Freq: Every day | ORAL | 0 refills | Status: DC

## 2023-03-19 NOTE — Telephone Encounter (Signed)
 Copied from CRM [id number]. Topic: Clinical - Medication Refill >> Mar 19, 2023  1:01 PM Kinnie H wrote: Most Recent Primary Care Visit:  Provider: JUANICE THOMES SAUNDERS  Department: [facility]  Visit Type: OFFICE VISIT  Date: 03/14/2023  Medication: hydrochlorothiazide  (HYDRODIURIL ) 25 MG tablet   Has the patient contacted their pharmacy? Yes (Agent: If no, request that the patient contact the pharmacy for the refill. If patient does not wish to contact the pharmacy document the reason why and proceed with request.) (Agent: If yes, when and what did the pharmacy advise?)  Is this the correct pharmacy for this prescription? Yes If no, delete pharmacy and type the correct one.  This is the patient's preferred pharmacy:  [id number] - [place], [place] - [address] AT [address] OF [facility] [address] [place] [address] Phone: [phone] Fax: [phone]   Has the prescription been filled recently? No  Is the patient out of the medication? Yes  Has the patient been seen for an appointment in the last year OR does the patient have an upcoming appointment? Yes  Can we respond through MyChart? No  Agent: Please be advised that Rx refills may take up to 3 business days. We ask that you follow-up with your pharmacy.

## 2023-03-24 ENCOUNTER — Telehealth: Payer: Self-pay

## 2023-03-24 NOTE — Telephone Encounter (Signed)
 Copied from CRM [id number]. Topic: General - Other >> Mar 21, 2023  2:37 PM Bridgette Campus T wrote: Reason for CRM: returning call from office, no message was left >> Mar 24, 2023  8:00 AM RMA Blake Bun H wrote: I see a message in her chart maybe about medication.   Pt was advise na did not want to change medication. Pt  wa advise if she could she could stop by to have it checked here. KH

## 2023-03-26 ENCOUNTER — Ambulatory Visit (INDEPENDENT_AMBULATORY_CARE_PROVIDER_SITE_OTHER): Payer: 59 | Admitting: Gastroenterology

## 2023-03-26 ENCOUNTER — Encounter: Payer: Self-pay | Admitting: Gastroenterology

## 2023-03-26 ENCOUNTER — Telehealth: Payer: Self-pay | Admitting: Nurse Practitioner

## 2023-03-26 VITALS — BP 160/100 | HR 88 | Ht 67.0 in | Wt 252.0 lb

## 2023-03-26 DIAGNOSIS — Z8719 Personal history of other diseases of the digestive system: Secondary | ICD-10-CM | POA: Diagnosis not present

## 2023-03-26 DIAGNOSIS — K219 Gastro-esophageal reflux disease without esophagitis: Secondary | ICD-10-CM | POA: Diagnosis not present

## 2023-03-26 DIAGNOSIS — K5909 Other constipation: Secondary | ICD-10-CM | POA: Diagnosis not present

## 2023-03-26 DIAGNOSIS — Z1211 Encounter for screening for malignant neoplasm of colon: Secondary | ICD-10-CM

## 2023-03-26 DIAGNOSIS — K5903 Drug induced constipation: Secondary | ICD-10-CM

## 2023-03-26 DIAGNOSIS — I10 Essential (primary) hypertension: Secondary | ICD-10-CM | POA: Diagnosis not present

## 2023-03-26 DIAGNOSIS — Z79899 Other long term (current) drug therapy: Secondary | ICD-10-CM | POA: Diagnosis not present

## 2023-03-26 DIAGNOSIS — G894 Chronic pain syndrome: Secondary | ICD-10-CM | POA: Diagnosis not present

## 2023-03-26 DIAGNOSIS — M51369 Other intervertebral disc degeneration, lumbar region without mention of lumbar back pain or lower extremity pain: Secondary | ICD-10-CM | POA: Diagnosis not present
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
## 2023-03-26 NOTE — Patient Instructions (Signed)
Start Metamucil daily.  If your blood pressure at your visit was 140/90 or greater, please contact your primary care physician to follow up on this.  _______________________________________________________  If you are age 56 or older, your body mass index should be between 23-30. Your Body mass index is 39.47 kg/m. If this is out of the aforementioned range listed, please consider follow up with your Primary Care Provider.  If you are age 20 or younger, your body mass index should be between 19-25. Your Body mass index is 39.47 kg/m. If this is out of the aformentioned range listed, please consider follow up with your Primary Care Provider.   ________________________________________________________  The [facility] providers would like to encourage you to use [facility] to communicate with providers for non-urgent requests or questions.  Due to long hold times on the telephone, sending your provider a message by [facility] may be a faster and more efficient way to get a response.  Please allow 48 business hours for a response.  Please remember that this is for non-urgent requests.  _______________________________________________________    It was a pleasure to see you today!  Thank you for trusting me with your gastrointestinal care!    Scott E.Tomasa Rand, MD

## 2023-03-26 NOTE — Telephone Encounter (Signed)
Copied from CRM [id number]. Topic: General - Other >> Mar 26, 2023  8:47 AM Phill Myron wrote: Pt. Crystal Bullock calling. She is currently at her Pain Management appointment and she wanted to report to her PCP that they took her Blood pressure and it is 124/68.

## 2023-03-27 NOTE — Telephone Encounter (Signed)
See note

## 2023-03-28 ENCOUNTER — Telehealth: Payer: Self-pay

## 2023-03-28 NOTE — Telephone Encounter (Signed)
Copied from CRM [id number]. Topic: Clinical - Lab/Test Results >> Mar 28, 2023  3:29 PM Shelah Lewandowsky wrote: Reason for CRM: message for Neva Seat [facility] Case Management needs updated A1C for patient please call  [phone]   Info was provided to the [facility] rep and apt dest noted to obtain a referral to podiatry. KH

## 2023-03-28 NOTE — Telephone Encounter (Signed)
[employer] called to advise of pt A1c . Called back and lvm  [phone]

## 2023-03-31 ENCOUNTER — Encounter: Payer: 59 | Admitting: Physical Medicine & Rehabilitation

## 2023-03-31 ENCOUNTER — Ambulatory Visit: Payer: Self-pay | Admitting: Nurse Practitioner

## 2023-03-31 NOTE — Telephone Encounter (Signed)
  Chief Complaint: Boil Symptoms: redness and swelling Frequency: patient states she has been dealing with Boils since she was 18 Pertinent Negatives: Patient denies fever Disposition: [] ED /[] Urgent Care (no appt availability in office) / [] Appointment(In office/virtual)/ []  [facility]/ [] Home Care/ [] Refused Recommended Disposition /[] [place] [facility]/ [x]  Follow-up with PCP Additional Notes: patient was calling about pain from her boils and wanting to know an update on her Augmentin that she had requested. This RN looked through chart and there isn't a request for Augmentin. Patient was very frustrated throughout triage process. Patient is requesting for Augmentin to be sent to her pharmacy. Patient was educated that this may not happen before end of day. Options for ED or UC were given. Patient states she would rather wait for antibiotic. Patient verbalized understanding and all questions answered.    Copied from CRM [id number]. Topic: Clinical - Red Word Triage >> Mar 31, 2023  4:21 PM Geroge Baseman wrote: Red Word that prompted transfer to Nurse Triage: Boils under the arm, iin a lot of pain and still hasn't got med refill Reason for Disposition  2 or more boils  Answer Assessment - Initial Assessment Questions 1. APPEARANCE of BOIL: "What does the boil look like?"      Red and swollen 2. LOCATION: "Where is the boil located?"      Right underarm 3. NUMBER: "How many boils are there?"      several 4. SIZE: "How big is the boil?" (e.g., inches, cm; compare to size of a coin or other object)     Patient unable to tell me 5. ONSET: "When did the boil start?"     Been going on since patient was 18.  6. PAIN: "Is there any pain?" If Yes, ask: "How bad is the pain?"   (Scale 1-10; or mild, moderate, severe)     10 7. FEVER: "Do you have a fever?" If Yes, ask: "What is it, how was it measured, and when did it start?"      No 8. SOURCE: "Have you been around anyone with boils  or other Staph infections?" "Have you ever had boils before?"     No 9. OTHER SYMPTOMS: "Do you have any other symptoms?" (e.g., shaking chills, weakness, rash elsewhere on body)     No  Protocols used: Boil (Skin Abscess)-A-AH

## 2023-04-01 ENCOUNTER — Telehealth: Payer: Self-pay

## 2023-04-01 ENCOUNTER — Other Ambulatory Visit: Payer: Self-pay | Admitting: Nurse Practitioner

## 2023-04-01 ENCOUNTER — Other Ambulatory Visit: Payer: Self-pay

## 2023-04-01 DIAGNOSIS — K651 Peritoneal abscess: Secondary | ICD-10-CM

## 2023-04-01 DIAGNOSIS — Z79899 Other long term (current) drug therapy: Secondary | ICD-10-CM

## 2023-04-01 DIAGNOSIS — E119 Type 2 diabetes mellitus without complications: Secondary | ICD-10-CM

## 2023-04-01 MED ORDER — AMOXICILLIN-POT CLAVULANATE 875-125 MG PO TABS: 1.0000 | ORAL_TABLET | Freq: Two times a day (BID) | ORAL | 0 refills | Status: DC

## 2023-04-01 NOTE — Progress Notes (Addendum)
 04/01/2023 Name: Crystal Bullock MRN: [id number] DOB: [birth year]  Chief Complaint  Patient presents with   Hypertension   Diabetes    Crystal Bullock is a 58 y.o. year old female who presented for a telephone visit.   They were referred to the pharmacist by their PCP for assistance in managing diabetes and hypertension.    Subjective: [name] is a 58 year old female with a PMH significant for type 2 diabetes, hypertension, and obesity.   Care Team: Primary Care Provider: Ivonne Andrew, NP ; Next Scheduled Visit: 04/18/2023   Medication Access/Adherence  Current Pharmacy:  [id number] - [place], [place] - [address] AT CORNER OF [facility] [address] [place] [address] Phone: [phone] Fax: [phone]   Patient reports affordability concerns with their medications: No  Patient reports access/transportation concerns to their pharmacy: No  Patient reports adherence concerns with their medications:  No     Diabetes:  Current medications: None Medications tried in the past: Ozempic (weight-loss) - she reports she stopped due to a lot of things going on in her personal like as well as nausea intolerance. -- Report her PCP suggested that she resumes Ozempic, but patient is not interested. She also felt that Ozempic did not help with weight loss.   Current glucose readings: She does not have a machine to check blood glucose  Patient reports hypoglycemic s/sx including sometimes dizziness, shakiness, sweating. Patient reports hyperglycemic symptoms including polyuria (likely from CTD medication as well),  nocturia (likely from CTD medication as well).  Current meal patterns:  - Breakfast: typically skips; sausage-egg-cheese McMuffin - Lunch typically skips; salad - Supper: bake spaghetti with bread - Snacks chips (rare) - Drinks water, Gatorade (regular), Poppi Sodas(regular)  Current physical activity: 15-20  crunches with grandchildren   Hypertension:  Current medications: Amlodipine 10 mg daily; Chlorthalidone 25 mg daily Medications previously tried: hydrochlorothiazide, metoprolol succinate, olmesartan  Patient has a validated, automated, upper arm home BP cuff, but it needs batteries Current blood pressure readings readings: 124/68  (per telephone note on 03/26/2023 from pain clinic), but there is also a recent BP on 03/26/2023 160/100 documented in the chart from [facility]  Patient reports hypotensive s/sx including sometimes dizziness, lightheadedness.  Patient denies hypertensive symptoms including headache, chest pain, shortness of breath  Current meal patterns: see above  Current physical activity: see above    Objective:  Lab Results  Component Value Date   HGBA1C 7.6 (H) 03/14/2023    Lab Results  Component Value Date   CREATININE 1.05 (H) 03/14/2023   BUN 11 03/14/2023   NA 138 03/14/2023   K 3.5 03/14/2023   CL 99 03/14/2023   CO2 30 03/14/2023    Lab Results  Component Value Date   CHOL 148 08/12/2022   HDL 37 (L) 08/12/2022   LDLCALC 75 08/12/2022   TRIG 214 (H) 08/12/2022   CHOLHDL 4.0 08/12/2022    Medications Reviewed Today     Reviewed by Katha Cabal, RPH (Pharmacist) on 04/01/23 at 1612  Med List Status: <None>   Medication Order Taking? Sig Documenting Provider Last Dose Status Informant  albuterol (PROVENTIL) (2.5 MG/3ML) 0.083% nebulizer solution [id number] Yes USE 1 VIAL VIA NEBULIZER EVERY 6 HOURS AS NEEDED FOR WHEEZING OR SHORTNESS OF BREATH Paseda, Baird Kay, FNP Taking Active   albuterol (VENTOLIN HFA) 108 (90 Base) MCG/ACT inhaler [id number] Yes INHALE 2 PUFFS INTO THE LUNGS EVERY 6 HOURS FOR UP TO 90 DOSES  AS NEEDED FOR WHEEZING Ivonne Andrew, NP Taking Active   amLODipine (NORVASC) 10 MG tablet [id number] Yes TAKE 1 TABLET(10 MG) BY MOUTH DAILY Ivonne Andrew, NP Taking Active   amoxicillin-clavulanate (AUGMENTIN) 875-125 MG tablet  [id number] Yes Take 1 tablet by mouth 2 (two) times daily. Ivonne Andrew, NP Taking Active            Med Note Para March, [facility] R   Tue Apr 01, 2023  3:55 PM) Currently taking medication   ascorbic acid (VITAMIN C) 500 MG tablet [id number] Yes Take 1 tablet (500 mg total) by mouth daily. Ivonne Andrew, NP Taking Active   baclofen (LIORESAL) 10 MG tablet [id number] Yes Take 10 mg by mouth 2 (two) times daily as needed for muscle spasms. [provider] Taking Active Self           Med Note Para March, [facility] R   Tue Apr 01, 2023  3:56 PM) Taking prn  benzonatate (TESSALON) 200 MG capsule [id number] Yes Take 1 capsule (200 mg total) by mouth 3 (three) times daily as needed for cough. Ivonne Andrew, NP Taking Active   cetirizine (ZYRTEC) 10 MG tablet [id number] Yes TAKE 1 TABLET BY MOUTH DAILY AS NEEDED FOR ALLERGIES Ivonne Andrew, NP Taking Active   chlorthalidone (HYGROTON) 25 MG tablet [id number] Yes Take 1 tablet (25 mg total) by mouth daily. Ivonne Andrew, NP Taking Active   clindamycin (CLINDAGEL) 1 % gel [id number] Yes Apply topically 2 (two) times daily. Ivonne Andrew, NP Taking Active Self           Med Note Para March, [facility] R   Tue Apr 01, 2023  3:58 PM) When needed  docusate sodium (COLACE) 100 MG capsule [id number] Yes Take 1 capsule (100 mg total) by mouth 2 (two) times daily. Ivonne Andrew, NP Taking Active   esomeprazole (NEXIUM) 40 MG capsule [id number] Yes Take 1 capsule (40 mg total) by mouth daily. Ivonne Andrew, NP Taking Active            Med Note Para March, [facility] R   Tue Apr 01, 2023  3:58 PM) Taking PRN  fluticasone (FLONASE) 50 MCG/ACT nasal spray [id number] Yes PLACE 2 SPRAYS INTO BOTH NOSTRILS DAILY AS NEEDED. ALLERGIES Ivonne Andrew, NP Taking Active            Med Note Para March, [facility] R   Tue Apr 01, 2023  3:59 PM) Taking differently: 2 sprays in each nostril daily  gabapentin (NEURONTIN) 300 MG capsule [id number] Yes Take 300 mg by mouth 3 (three)  times daily. [provider] Taking Active            Med Note Para March, [facility] R   Tue Apr 01, 2023  4:00 PM) PRN sciatic pain  hydrOXYzine (VISTARIL) 50 MG capsule [id number] Yes Take as prescribed as AVS. Barbette Merino, NP Taking Active Self           Med Note Katha Cabal   Tue Apr 01, 2023  4:01 PM) Taking PRN  Karlene Einstein 145 MCG CAPS capsule [id number] Yes  [provider] Taking Active            Med Note Cephus Shelling R   Tue Apr 01, 2023  4:02 PM) Taking differently: On Sundays  meloxicam (MOBIC) 15 MG tablet [id number] Yes Take 15 mg by mouth daily. [provider] Taking Active            Med  Note Para March, [facility] R   Tue Apr 01, 2023  4:04 PM) Taking PRN  methocarbamol (ROBAXIN) 500 MG tablet [id number] Yes Take 1 tablet (500 mg total) by mouth every 6 (six) hours as needed for muscle spasms. Arby Barrette, MD Taking Active Self           Med Note Para March, [facility] R   Tue Apr 01, 2023  4:04 PM) Taking PRN   methylPREDNISolone (MEDROL DOSEPAK) 4 MG TBPK tablet [id number] No Please take as instructed on the packaging  Patient not taking: Reported on 04/01/2023   Ivonne Andrew, NP Not Taking Active   Multiple Vitamins-Minerals (MULTIVITAMINS THER. W/MINERALS) TABS tablet [id number] Yes Take 1 tablet by mouth daily. Ivonne Andrew, NP Taking Active   Clent Demark [id number] Yes CPAP at bedtime [provider] Taking Active Self           Med Note Para March, [facility] R   Tue Apr 01, 2023  4:05 PM) Reports she typically does not use CPAP but has used it in the last 24 hours  nystatin ointment (MYCOSTATIN) [id number] Yes Apply 1 application. topically 2 (two) times daily. Kathrynn Speed, NP Taking Active Self           Med Note Para March, [facility] R   Tue Apr 01, 2023  4:06 PM) Taking prn  ondansetron (ZOFRAN) 4 MG tablet [id number] Yes Take 1 tablet (4 mg total) by mouth every 8 (eight) hours as needed for nausea or vomiting. Ivonne Andrew, NP Taking  Active            Med Note Para March, [facility] R   Tue Apr 01, 2023  4:06 PM) Taking prn   oxyCODONE-acetaminophen (PERCOCET) 10-325 MG tablet [id number] Yes Take 1 tablet by mouth every 8 (eight) hours as needed for pain. [provider] Taking Active Self           Med Note Para March, [facility] R   Tue Apr 01, 2023  4:07 PM) Taking PRN  Potassium Bicarbonate 99 MG CAPS [id number] Yes Take 99 mg by mouth daily. [provider] Taking Active Self  potassium chloride SA (KLOR-CON M) 20 MEQ tablet [id number] No Take 1 tablet (20 mEq total) by mouth 2 (two) times daily.  Patient not taking: Reported on 04/01/2023   Ivonne Andrew, NP Not Taking Active            Med Note Para March, [facility] R   Tue Apr 01, 2023  4:07 PM) Completed therapy   promethazine (PHENERGAN) 12.5 MG tablet [id number] No Take 1 tablet (12.5 mg total) by mouth every 8 (eight) hours as needed for nausea or vomiting.  Patient not taking: Reported on 04/01/2023   Ivonne Andrew, NP Not Taking Active   Semaglutide,0.25 or 0.5MG /DOS, (OZEMPIC, 0.25 OR 0.5 MG/DOSE,) 2 MG/3ML SOPN [id number] No INJECT 0.5 MG INTO THE SKIN ONE TIME PER WEEK  Patient not taking: Reported on 04/01/2023   Ivonne Andrew, NP Not Taking Active            Med Note Para March [facility] R   Tue Apr 01, 2023  4:11 PM) Stopped taking; around Jan 1st   sucralfate (CARAFATE) 1 g tablet [id number] No Take 1 tablet (1 g total) by mouth 4 (four) times daily as needed.  Patient not taking: Reported on 04/01/2023   Sabas Sous, MD Not Taking Active Self  Vitamin D, Ergocalciferol, (DRISDOL) 1.25 MG (50000 UNIT) CAPS capsule [id number] Yes Take 1  capsule (50,000 Units total) by mouth every 7 (seven) days. Ivonne Andrew, NP Taking Active            Med Note Para March, [facility] R   Tue Apr 01, 2023  4:12 PM) Take on Sundays              Assessment/Plan:   Diabetes: - The patient's diabetes is currently uncontrolled, with a goal A1c of <7%, fasting glucose  <130, and 2-hour postprandial glucose <180. Patient states she was on Ozempic for weight loss and does not feel she has diabetes. I discussed the parameters for diagnosing diabetes to clarify the need for treatment. The patient expressed a reluctance to restart Ozempic but is open to trying Rybelsus. I informed the patient that Rybelsus is similar to Ozempic, but it is in an oral form, offering a more convenient option for diabetes management.10-year ASCVD risk score  is: 25.6%. - Reviewed long term cardiovascular and renal outcomes of uncontrolled blood sugar; specifically related to medication adherence - Reviewed goal A1c, goal fasting, and goal 2 hour post prandial glucose - Reviewed dietary modifications including: suggested using 1/2 pack of Gatorade Zero hydration packs (this will reduce the sodium and carbohydrate), protein rich foods (cheese, nuts, meat), fruits, and veggies - Reviewed lifestyle modifications including: encouraged moderate intensity exercise such adding 15-20 min walks with grandkids each day - Recommend to check glucose: currently does not own a glucometer to check blood glucose   - Recommendations:   START Rybelsus 3 mg orally once daily for 30 days, then increase dose to 7 mg once daily; may assess for additional glycemic control  START glucometer, lancets, and test strips   May consider a moderate intensity statin when appropriate to initiate  CHECK blood glucose two times daily (fasting and 2-hr post prandial)   Hypertension: - Currently uncontrolled Goal <130/80. She is taking over the counter potassium supplements. She has a blood pressure monitor at home but needs batteries. Reviewed switching to spironolactone and patient became hesitant about medication due to risk for breast cancer (discussed risks vs. Benefits). She reports no family history of breast cancer.  - Reviewed long term cardiovascular and renal outcomes of uncontrolled blood pressure; specifically  related to medication adherence - Reviewed appropriate blood pressure monitoring technique and reviewed goal blood pressure. Recommended to check home blood pressure and heart rate at least once daily and journal.  - Recommended to get new batteries for ambulatory blood pressure device and she was agreeable. Asked patient to bring BP device to next appointment for accuracy assessment.  - Continue Amlodipine 10 mg daily and Chlorthalidone 25 mg daily (until I hear back from patient on alternative therapy or patient discusses with PCP) - Recommend to STOP potassium supplement as K+ will need to be ordered and under provider's supervision. Additionally, there could be risks when potassium is too high. She recently completed a short course of prescription K+ supplement replacement.  -Triamterene (this does not offer as much blood pressure lowering benefits) and spironolactone were disccused and patient would like to research both medication due to side effects profile and risks.  - Future plan: Send in request for new blood pressure monitor   Follow Up Plan: 1 week   Time spent: 55 minutes  Thank you for allowing pharmacy to be a part of this patient's care. Cephus Shelling, PharmD Clinical Pharmacist Cell: [phone]

## 2023-04-01 NOTE — Progress Notes (Signed)
   04/01/2023  Patient ID: Crystal Bullock, female   DOB: [birth year], 58 y.o.   MRN: [id number]  Attempted to contact patient for medication management/review. Left HIPAA compliant message for patient to return my call at their convenience.   Second attempt for patient outreach. Will follow up with patient in 5-7 business days.  Thank you for allowing pharmacy to be a part of this patient's care.  Cephus Shelling, PharmD Clinical Pharmacist Cell: [phone]

## 2023-04-03 ENCOUNTER — Encounter: Payer: 59 | Admitting: Physical Medicine & Rehabilitation

## 2023-04-08 ENCOUNTER — Telehealth: Payer: Self-pay

## 2023-04-08 DIAGNOSIS — Z79899 Other long term (current) drug therapy: Secondary | ICD-10-CM

## 2023-04-08 NOTE — Addendum Note (Signed)
 Addended by: Cephus Shelling R on: 04/08/2023 01:20 PM   Modules accepted: Orders

## 2023-04-08 NOTE — Progress Notes (Addendum)
   04/08/2023  Patient ID: Crystal Bullock, female   DOB: [birth year], 58 y.o.   MRN: [id number]  Attempted to contact patient for medication management/review. Left HIPAA compliant message for patient to return my call at their convenience.   Third attempt for patient outreach. A third unsuccesful telephone outreach was attempted today to contact the patient who was referred to the pharmacy for assistance with medication management. The Population Health team is pleased to engage with this patient at any time in the future upon receipt of referral and should he/she be interested in assistance from the [facility] team.     Thank you for allowing pharmacy to be a part of this patient's care.  Alexandria Angel, PharmD Clinical Pharmacist Cell: [phone]

## 2023-04-09 MED ORDER — BLOOD GLUCOSE TEST VI STRP: 1.0000 | ORAL_STRIP | Freq: Two times a day (BID) | 0 refills | Status: DC

## 2023-04-09 MED ORDER — LANCET DEVICE MISC
1.0000 | Freq: Two times a day (BID) | 0 refills | Status: AC
Start: 2023-04-09 — End: ?

## 2023-04-09 MED ORDER — BLOOD GLUCOSE MONITORING SUPPL DEVI: 1.0000 | Freq: Two times a day (BID) | 0 refills | Status: AC

## 2023-04-09 MED ORDER — RYBELSUS 3 MG PO TABS: 3.0000 mg | ORAL_TABLET | Freq: Every day | ORAL | 0 refills | Status: AC

## 2023-04-09 MED ORDER — LANCETS MISC. MISC: 1.0000 | Freq: Two times a day (BID) | 0 refills | Status: AC

## 2023-04-10 ENCOUNTER — Other Ambulatory Visit: Payer: Self-pay | Admitting: Nurse Practitioner

## 2023-04-17 ENCOUNTER — Other Ambulatory Visit: Payer: Self-pay

## 2023-04-17 ENCOUNTER — Other Ambulatory Visit: Payer: Self-pay | Admitting: Nurse Practitioner

## 2023-04-17 DIAGNOSIS — L732 Hidradenitis suppurativa: Secondary | ICD-10-CM

## 2023-04-17 DIAGNOSIS — J209 Acute bronchitis, unspecified: Secondary | ICD-10-CM

## 2023-04-17 DIAGNOSIS — K651 Peritoneal abscess: Secondary | ICD-10-CM

## 2023-04-17 MED ORDER — THERA M PLUS PO TABS: 1.0000 | ORAL_TABLET | Freq: Every day | ORAL | 0 refills | Status: AC

## 2023-04-17 MED ORDER — DOCUSATE SODIUM 100 MG PO CAPS
100.0000 mg | ORAL_CAPSULE | Freq: Two times a day (BID) | ORAL | 0 refills | Status: DC
Start: 2023-04-17 — End: 2023-05-23

## 2023-04-17 NOTE — Telephone Encounter (Signed)
 Copied from CRM [id number]. Topic: Clinical - Medication Refill >> Apr 17, 2023  9:56 AM Eunice Blase wrote: Most Recent Primary Care Visit:  Provider: Donell Beers  Department: [facility]  Visit Type: OFFICE VISIT  Date: 03/14/2023  Medication: benzonatate (TESSALON) 200 MG capsule  Has the patient contacted their pharmacy? Yes (Agent: If no, request that the patient contact the pharmacy for the refill. If patient does not wish to contact the pharmacy document the reason why and proceed with request.) (Agent: If yes, when and what did the pharmacy advise?)Needs approval from PCP  Is this the correct pharmacy for this prescription? Yes If no, delete pharmacy and type the correct one.  This is the patient's preferred pharmacy:  [id number] - [place], [place] - [address] AT [address] OF [facility] [address] [place] [address] Phone: [phone] Fax: [phone]   Has the prescription been filled recently? Yes  Is the patient out of the medication? Yes  Has the patient been seen for an appointment in the last year OR does the patient have an upcoming appointment? Yes  Can we respond through MyChart? Yes  Agent: Please be advised that Rx refills may take up to 3 business days. We ask that you follow-up with your pharmacy.

## 2023-04-17 NOTE — Telephone Encounter (Signed)
 Please advise [facility]

## 2023-04-18 ENCOUNTER — Ambulatory Visit: Payer: Self-pay | Admitting: Nurse Practitioner

## 2023-04-23 DIAGNOSIS — Z79899 Other long term (current) drug therapy: Secondary | ICD-10-CM | POA: Diagnosis not present

## 2023-04-23 DIAGNOSIS — M51369 Other intervertebral disc degeneration, lumbar region without mention of lumbar back pain or lower extremity pain: Secondary | ICD-10-CM | POA: Diagnosis not present

## 2023-04-23 DIAGNOSIS — G894 Chronic pain syndrome: Secondary | ICD-10-CM | POA: Diagnosis not present

## 2023-04-25 ENCOUNTER — Ambulatory Visit (INDEPENDENT_AMBULATORY_CARE_PROVIDER_SITE_OTHER): Payer: 59 | Admitting: Nurse Practitioner

## 2023-04-25 ENCOUNTER — Encounter: Payer: Self-pay | Admitting: Nurse Practitioner

## 2023-04-25 VITALS — BP 126/78 | HR 94 | Temp 98.4°F | Wt 248.8 lb

## 2023-04-25 DIAGNOSIS — E119 Type 2 diabetes mellitus without complications: Secondary | ICD-10-CM

## 2023-04-25 LAB — POCT GLYCOSYLATED HEMOGLOBIN (HGB A1C): Hemoglobin A1C: 7.5 % — AB (ref 4.0–5.6)

## 2023-04-25 MED ORDER — HYDROCHLOROTHIAZIDE 12.5 MG PO CAPS: 12.5000 mg | ORAL_CAPSULE | Freq: Every day | ORAL | 2 refills | Status: DC

## 2023-04-25 MED ORDER — AZITHROMYCIN 250 MG PO TABS: ORAL_TABLET | ORAL | 0 refills | Status: AC

## 2023-04-25 NOTE — Progress Notes (Signed)
 Subjective   Patient ID: Crystal Bullock, female    DOB: [birth year], 58 y.o.   MRN: [id number]  Chief Complaint  Patient presents with   Follow-up    Referring provider: Ivonne Andrew, NP  Crystal Bullock is a 58 y.o. female with Past Medical History: 10/06/2015: Anxiety No date: Asthma 10/06/2015: Atypical chest pain No date: Chronic neck pain No date: Fibroid No date: Hypertension 10/06/2015: Palpitations 10/06/2015: RBBB No date: Sarcoidosis No date: Scoliosis No date: Seasonal allergies No date: Sickle cell trait (HCC) 03/14/2023: Type 2 diabetes mellitus without complication, without  long-term current use of insulin (HCC) 05/2019: Vitamin D deficiency   HPI  Patient presents today for follow-up visit.  She is out of work currently due to sarcoidosis and lumbar radiculopathy.  Pain management (Dr. Lucretia Field) is advising physical therapy.  We placed referral to physical therapy today.   Hydradenitis suppurative:   Referral was placed to dermatology alt visit for hidradenitis suppurative. Has appointment scheduled.   Diabetes:  A1c in office today 7.5.  Patient states that Ozempic is making her nauseated.  We will place a referral to pharmacy for medication management. denies f/c/s, n/v/d, hemoptysis, PND, leg swelling. Denies chest pain or edema.   URI:  Started couple of weeks ago. Has cough and fatigue. Would like to start azithromycin.   Allergies  Allergen Reactions   Bee Venom Anaphylaxis   Iodinated Contrast Media Rash    Hives on hands and feet per pt 08/16/15 pt had scan w/ contrast and had a 4 hr premedication protocol done     There is no immunization history on file for this patient.  Tobacco History: Social History   Tobacco Use  Smoking Status Never  Smokeless Tobacco Never   Counseling given: Not Answered   Outpatient Encounter Medications as of 04/25/2023  Medication Sig   albuterol (PROVENTIL) (2.5 MG/3ML) 0.083% nebulizer  solution USE 1 VIAL VIA NEBULIZER EVERY 6 HOURS AS NEEDED FOR WHEEZING OR SHORTNESS OF BREATH   albuterol (VENTOLIN HFA) 108 (90 Base) MCG/ACT inhaler INHALE 2 PUFFS INTO THE LUNGS EVERY 6 HOURS FOR UP TO 90 DOSES AS NEEDED FOR WHEEZING   amLODipine (NORVASC) 10 MG tablet TAKE 1 TABLET(10 MG) BY MOUTH DAILY   amoxicillin-clavulanate (AUGMENTIN) 875-125 MG tablet Take 1 tablet by mouth 2 (two) times daily.   ascorbic acid (VITAMIN C) 500 MG tablet Take 1 tablet (500 mg total) by mouth daily.   azithromycin (ZITHROMAX) 250 MG tablet Take 2 tablets on day 1, then 1 tablet daily on days 2 through 5   baclofen (LIORESAL) 10 MG tablet Take 10 mg by mouth 2 (two) times daily as needed for muscle spasms.   benzonatate (TESSALON) 200 MG capsule Take 1 capsule (200 mg total) by mouth 3 (three) times daily as needed for cough.   Blood Glucose Monitoring Suppl DEVI 1 each by Does not apply route 2 (two) times daily. May substitute to any manufacturer covered by patient's insurance.   cetirizine (ZYRTEC) 10 MG tablet TAKE 1 TABLET BY MOUTH DAILY AS NEEDED FOR ALLERGIES   clindamycin (CLINDAGEL) 1 % gel Apply topically 2 (two) times daily.   docusate sodium (COLACE) 100 MG capsule Take 1 capsule (100 mg total) by mouth 2 (two) times daily.   esomeprazole (NEXIUM) 40 MG capsule Take 1 capsule (40 mg total) by mouth daily.   fluticasone (FLONASE) 50 MCG/ACT nasal spray PLACE 2 SPRAYS INTO BOTH NOSTRILS DAILY AS NEEDED. ALLERGIES  gabapentin (NEURONTIN) 300 MG capsule Take 300 mg by mouth 3 (three) times daily.   Glucose Blood (BLOOD GLUCOSE TEST STRIPS) STRP 1 each by In Vitro route 2 (two) times daily. May substitute to any manufacturer covered by patient's insurance.   hydrochlorothiazide (MICROZIDE) 12.5 MG capsule Take 1 capsule (12.5 mg total) by mouth daily.   hydrOXYzine (VISTARIL) 50 MG capsule Take as prescribed as AVS.   Lancet Device MISC 1 each by Does not apply route 2 (two) times daily. May  substitute to any manufacturer covered by patient's insurance.   Lancets Misc. MISC 1 each by Does not apply route 2 (two) times daily. May substitute to any manufacturer covered by patient's insurance.   LINZESS 145 MCG CAPS capsule    meloxicam (MOBIC) 15 MG tablet Take 15 mg by mouth daily.   methocarbamol (ROBAXIN) 500 MG tablet Take 1 tablet (500 mg total) by mouth every 6 (six) hours as needed for muscle spasms.   Multiple Vitamins-Minerals (MULTIVITAMINS THER. W/MINERALS) TABS tablet Take 1 tablet by mouth daily.   NON FORMULARY CPAP at bedtime   nystatin ointment (MYCOSTATIN) Apply 1 application. topically 2 (two) times daily.   ondansetron (ZOFRAN) 4 MG tablet Take 1 tablet (4 mg total) by mouth every 8 (eight) hours as needed for nausea or vomiting.   oxyCODONE-acetaminophen (PERCOCET) 10-325 MG tablet Take 1 tablet by mouth every 8 (eight) hours as needed for pain.   Potassium Bicarbonate 99 MG CAPS Take 99 mg by mouth daily.   potassium chloride SA (KLOR-CON M) 20 MEQ tablet Take 1 tablet (20 mEq total) by mouth 2 (two) times daily.   Vitamin D, Ergocalciferol, (DRISDOL) 1.25 MG (50000 UNIT) CAPS capsule Take 1 capsule (50,000 Units total) by mouth every 7 (seven) days.   [DISCONTINUED] chlorthalidone (HYGROTON) 25 MG tablet TAKE 1 TABLET (25 MG TOTAL) BY MOUTH DAILY.   methylPREDNISolone (MEDROL DOSEPAK) 4 MG TBPK tablet Please take as instructed on the packaging (Patient not taking: Reported on 04/25/2023)   Semaglutide (RYBELSUS) 3 MG TABS Take 1 tablet (3 mg total) by mouth daily. (Patient not taking: Reported on 04/25/2023)   No facility-administered encounter medications on file as of 04/25/2023.    Review of Systems  Review of Systems  Constitutional:  Positive for fatigue.  HENT: Negative.    Respiratory:  Positive for cough.   Cardiovascular: Negative.   Gastrointestinal: Negative.   Allergic/Immunologic: Negative.   Neurological: Negative.   Psychiatric/Behavioral:  Negative.       Objective:   BP 126/78   Pulse 94   Temp 98.4 F (36.9 C) (Oral)   Wt 248 lb 12.8 oz (112.9 kg)   LMP 08/24/2015 Comment: irregular periods per pt 09/12/15  SpO2 97%   BMI 38.97 kg/m   Wt Readings from Last 5 Encounters:  04/25/23 248 lb 12.8 oz (112.9 kg)  03/26/23 252 lb (114.3 kg)  03/14/23 251 lb 5.2 oz (114 kg)  03/14/23 252 lb (114.3 kg)  03/09/23 252 lb (114.3 kg)     Physical Exam Vitals and nursing note reviewed.  Constitutional:      General: She is not in acute distress.    Appearance: She is well-developed.  Cardiovascular:     Rate and Rhythm: Normal rate and regular rhythm.  Pulmonary:     Effort: Pulmonary effort is normal.     Breath sounds: Normal breath sounds.  Neurological:     Mental Status: She is alert and oriented to person, place, and  time.       Assessment & Plan:   Type 2 diabetes mellitus without complication, without long-term current use of insulin (HCC) -     POCT glycosylated hemoglobin (Hb A1C)  Other orders -     Azithromycin; Take 2 tablets on day 1, then 1 tablet daily on days 2 through 5  Dispense: 6 tablet; Refill: 0 -     hydroCHLOROthiazide; Take 1 capsule (12.5 mg total) by mouth daily.  Dispense: 90 capsule; Refill: 2     Return in about 3 months (around 07/26/2023).   Ivonne Andrew, NP 04/25/2023

## 2023-04-25 NOTE — Patient Instructions (Signed)
 1. Type 2 diabetes mellitus without complication, without long-term current use of insulin (HCC) (Primary)  - POCT glycosylated hemoglobin (Hb A1C)  -starting life style modifications  Follow up:  Follow up in 3 months

## 2023-04-28 ENCOUNTER — Ambulatory Visit: Payer: 59 | Admitting: Dermatology

## 2023-05-02 ENCOUNTER — Ambulatory Visit: Payer: Self-pay | Admitting: Nurse Practitioner

## 2023-05-07 ENCOUNTER — Telehealth: Payer: Self-pay | Admitting: Nurse Practitioner

## 2023-05-07 NOTE — Telephone Encounter (Signed)
 Copied from CRM [id number]. Topic: General - Other >> May 07, 2023  2:16 PM Everette C wrote: Reason for CRM: Imelda Pillow with [employer] has called to request completion of a PCS form [id number] for personal care services   Please contact Davida at [phone] further if needed

## 2023-05-08 ENCOUNTER — Encounter: Payer: 59 | Admitting: Physical Medicine & Rehabilitation

## 2023-05-09 ENCOUNTER — Other Ambulatory Visit: Payer: Self-pay | Admitting: Nurse Practitioner

## 2023-05-09 DIAGNOSIS — K651 Peritoneal abscess: Secondary | ICD-10-CM

## 2023-05-09 NOTE — Telephone Encounter (Signed)
 Copied from CRM [id number]. Topic: Clinical - Medication Refill >> May 09, 2023 10:37 AM Ernst Spell wrote: Most Recent Primary Care Visit:  Provider: Ivonne Andrew  Department: [facility]  Visit Type: OFFICE VISIT  Date: 04/25/2023  Medication: amoxicillin// pt also wanted to let Archie Patten know that she is starting to have boils under arm pit again.  Has the patient contacted their pharmacy? Yes Couldn't access.   Is this the correct pharmacy for this prescription? Yes If no, delete pharmacy and type the correct one.  This is the patient's preferred pharmacy:  [id number] - [place], [place] - [address] AT [address]NER OF [facility] [address] [place] [address] Phone: [phone] Fax: [phone]   Has the prescription been filled recently? Yes  Is the patient out of the medication? Yes  Has the patient been seen for an appointment in the last year OR does the patient have an upcoming appointment? Yes  Can we respond through MyChart? Yes  Agent: Please be advised that Rx refills may take up to 3 business days. We ask that you follow-up with your pharmacy.

## 2023-05-12 MED ORDER — AMOXICILLIN-POT CLAVULANATE 875-125 MG PO TABS: 1.0000 | ORAL_TABLET | Freq: Two times a day (BID) | ORAL | 0 refills | Status: DC

## 2023-05-12 NOTE — Telephone Encounter (Signed)
 Please advise [facility]

## 2023-05-22 ENCOUNTER — Other Ambulatory Visit: Payer: Self-pay | Admitting: Nurse Practitioner

## 2023-05-22 ENCOUNTER — Telehealth: Payer: Self-pay

## 2023-05-22 DIAGNOSIS — K651 Peritoneal abscess: Secondary | ICD-10-CM

## 2023-05-22 MED ORDER — AMOXICILLIN-POT CLAVULANATE 875-125 MG PO TABS: 1.0000 | ORAL_TABLET | Freq: Two times a day (BID) | ORAL | 0 refills | Status: DC

## 2023-05-23 ENCOUNTER — Ambulatory Visit: Payer: 59 | Admitting: Cardiology

## 2023-05-23 ENCOUNTER — Other Ambulatory Visit: Payer: Self-pay

## 2023-05-23 DIAGNOSIS — M51369 Other intervertebral disc degeneration, lumbar region without mention of lumbar back pain or lower extremity pain: Secondary | ICD-10-CM | POA: Diagnosis not present

## 2023-05-23 DIAGNOSIS — K651 Peritoneal abscess: Secondary | ICD-10-CM

## 2023-05-23 DIAGNOSIS — Z79899 Other long term (current) drug therapy: Secondary | ICD-10-CM | POA: Diagnosis not present

## 2023-05-23 DIAGNOSIS — G894 Chronic pain syndrome: Secondary | ICD-10-CM | POA: Diagnosis not present

## 2023-05-23 MED ORDER — DOCUSATE SODIUM 100 MG PO CAPS: 100.0000 mg | ORAL_CAPSULE | Freq: Two times a day (BID) | ORAL | 0 refills | Status: DC

## 2023-05-23 NOTE — Telephone Encounter (Signed)
 Completed.

## 2023-06-03 ENCOUNTER — Other Ambulatory Visit: Payer: Self-pay

## 2023-06-03 DIAGNOSIS — K651 Peritoneal abscess: Secondary | ICD-10-CM

## 2023-06-03 MED ORDER — DOCUSATE SODIUM 100 MG PO CAPS
100.0000 mg | ORAL_CAPSULE | Freq: Two times a day (BID) | ORAL | 0 refills | Status: DC
Start: 2023-06-03 — End: 2023-06-12

## 2023-06-12 ENCOUNTER — Other Ambulatory Visit: Payer: Self-pay

## 2023-06-12 DIAGNOSIS — K651 Peritoneal abscess: Secondary | ICD-10-CM

## 2023-06-12 MED ORDER — DOCUSATE SODIUM 100 MG PO CAPS: 100.0000 mg | ORAL_CAPSULE | Freq: Two times a day (BID) | ORAL | 0 refills | Status: DC

## 2023-06-16 ENCOUNTER — Other Ambulatory Visit: Payer: Self-pay

## 2023-06-16 DIAGNOSIS — J209 Acute bronchitis, unspecified: Secondary | ICD-10-CM

## 2023-06-16 DIAGNOSIS — K219 Gastro-esophageal reflux disease without esophagitis: Secondary | ICD-10-CM

## 2023-06-16 MED ORDER — ESOMEPRAZOLE MAGNESIUM 40 MG PO CPDR: 40.0000 mg | DELAYED_RELEASE_CAPSULE | Freq: Every day | ORAL | 0 refills | Status: DC

## 2023-06-16 NOTE — Telephone Encounter (Signed)
 Please advise [facility]

## 2023-06-17 ENCOUNTER — Encounter: Payer: Self-pay | Admitting: Cardiovascular Disease

## 2023-06-17 ENCOUNTER — Ambulatory Visit: Payer: 59 | Attending: Cardiovascular Disease | Admitting: Cardiovascular Disease

## 2023-06-17 VITALS — BP 124/76 | HR 75 | Ht 67.5 in | Wt 249.8 lb

## 2023-06-17 DIAGNOSIS — R0789 Other chest pain: Secondary | ICD-10-CM | POA: Diagnosis not present

## 2023-06-17 DIAGNOSIS — G4733 Obstructive sleep apnea (adult) (pediatric): Secondary | ICD-10-CM | POA: Diagnosis not present

## 2023-06-17 DIAGNOSIS — R002 Palpitations: Secondary | ICD-10-CM

## 2023-06-17 DIAGNOSIS — I10 Essential (primary) hypertension: Secondary | ICD-10-CM

## 2023-06-17 NOTE — Assessment & Plan Note (Signed)
 History of obstructive sleep apnea on CPAP.

## 2023-06-17 NOTE — Progress Notes (Signed)
 06/17/2023 Crystal Bullock   05/04/1965  [id number]  Primary Physician Jerrlyn Morel, NP Primary Cardiologist: Avanell Leigh MD FACP, [place], [place], [place]  HPI:  Crystal Bullock is a 58 y.o. severely overweight single African-American female mother of 3 children, grandmother of 5 grandchildren who is currently on disability because of back pain and sarcoidosis.  She was referred by Abbey Hobby, NP because of palpitations.  She has seen Dr. Maudine Sos in the past back in 2017 for atypical chest pain.  She had a negative Myoview  11/02/2015 at that time.  Her history is also remarkable for treated hypertension.  She has sarcoidosis which has been quiescent and obstructive sleep apnea on CPAP.  She does not smoke.  There is no family history of heart disease.  She is never had a heart attack or stroke.  She denies chest pain or shortness of breath.  She was experiencing palpitations during the winter months mostly situational because of stress which have since resolved.   Current Meds  Medication Sig   Accu-Chek Softclix Lancets lancets by Other route 3 (three) times daily.   amLODipine  (NORVASC ) 10 MG tablet TAKE 1 TABLET(10 MG) BY MOUTH DAILY   amoxicillin -clavulanate (AUGMENTIN ) 875-125 MG tablet Take 1 tablet by mouth 2 (two) times daily.   ascorbic acid  (VITAMIN C ) 500 MG tablet Take 1 tablet (500 mg total) by mouth daily.   benzonatate  (TESSALON ) 200 MG capsule Take 1 capsule (200 mg total) by mouth 3 (three) times daily as needed for cough.   Blood Glucose Monitoring Suppl (ACCU-CHEK GUIDE ME) w/Device KIT 3 (three) times daily.   Blood Glucose Monitoring Suppl DEVI 1 each by Does not apply route 2 (two) times daily. May substitute to any manufacturer covered by patient's insurance.   cetirizine  (ZYRTEC ) 10 MG tablet TAKE 1 TABLET BY MOUTH DAILY AS NEEDED FOR ALLERGIES   clindamycin  (CLINDAGEL ) 1 % gel Apply topically 2 (two) times daily.   docusate sodium  (COLACE)  100 MG capsule Take 1 capsule (100 mg total) by mouth 2 (two) times daily.   esomeprazole  (NEXIUM ) 40 MG capsule Take 1 capsule (40 mg total) by mouth daily. (Patient taking differently: Take 40 mg by mouth as needed.)   fluticasone  (FLONASE ) 50 MCG/ACT nasal spray PLACE 2 SPRAYS INTO BOTH NOSTRILS DAILY AS NEEDED. ALLERGIES   gabapentin (NEURONTIN) 300 MG capsule Take 300 mg by mouth as needed.   Glucose Blood (BLOOD GLUCOSE TEST STRIPS) STRP 1 each by In Vitro route 2 (two) times daily. May substitute to any manufacturer covered by patient's insurance.   Lancet Device MISC 1 each by Does not apply route 2 (two) times daily. May substitute to any manufacturer covered by patient's insurance.   Lancets Misc. MISC 1 each by Does not apply route 2 (two) times daily. May substitute to any manufacturer covered by patient's insurance.   Multiple Vitamins-Minerals (MULTIVITAMINS THER. W/MINERALS) TABS tablet Take 1 tablet by mouth daily.   oxyCODONE -acetaminophen  (PERCOCET) 10-325 MG tablet Take 1 tablet by mouth every 8 (eight) hours as needed for pain.   Vitamin D , Ergocalciferol , (DRISDOL ) 1.25 MG (50000 UNIT) CAPS capsule Take 1 capsule (50,000 Units total) by mouth every 7 (seven) days.     Allergies  Allergen Reactions   Bee Venom Anaphylaxis   Iodinated Contrast Media Rash    Hives on hands and feet per pt 08/16/15 pt had scan w/ contrast and had a 4 hr premedication protocol done  Social History   Socioeconomic History   Marital status: Single    Spouse name: Not on file   Number of children: 3   Years of education: Not on file   Highest education level: Some college, no degree  Occupational History   Occupation: disable  Tobacco Use   Smoking status: Never   Smokeless tobacco: Never  Vaping Use   Vaping status: Never Used  Substance and Sexual Activity   Alcohol use: Yes    Alcohol/week: 1.0 standard drink of alcohol    Types: 1 Glasses of wine per week    Comment:  occasional/holidays   Drug use: Not Currently    Types: Marijuana    Comment: once a week   Sexual activity: Yes    Birth control/protection: Condom  Other Topics Concern   Not on file  Social History Narrative   Not on file   Social Drivers of Health   Financial Resource Strain: Low Risk  (04/24/2023)   Overall Financial Resource Strain (CARDIA)    Difficulty of Paying Living Expenses: Not hard at all  Food Insecurity: No Food Insecurity (04/24/2023)   Hunger Vital Sign    Worried About Running Out of Food in the Last Year: Never true    Ran Out of Food in the Last Year: Never true  Transportation Needs: No Transportation Needs (04/24/2023)   PRAPARE - Administrator, Civil Service (Medical): No    Lack of Transportation (Non-Medical): No  Physical Activity: Insufficiently Active (04/24/2023)   Exercise Vital Sign    Days of Exercise per Week: 3 days    Minutes of Exercise per Session: 30 min  Stress: No Stress Concern Present (04/24/2023)   [employer] of Occupational Health - Occupational Stress Questionnaire    Feeling of Stress : Only a little  Social Connections: Moderately Integrated (04/24/2023)   Social Connection and Isolation Panel [NHANES]    Frequency of Communication with Friends and Family: Twice a week    Frequency of Social Gatherings with Friends and Family: Twice a week    Attends Religious Services: More than 4 times per year    Active Member of [employer] or Organizations: Yes    Attends Engineer, structural: More than 4 times per year    Marital Status: Never married  Intimate Partner Violence: Not At Risk (03/10/2023)   Humiliation, Afraid, Rape, and Kick questionnaire    Fear of Current or Ex-Partner: No    Emotionally Abused: No    Physically Abused: No    Sexually Abused: No     Review of Systems: General: negative for chills, fever, night sweats or weight changes.  Cardiovascular: negative for chest pain, dyspnea on exertion,  edema, orthopnea, palpitations, paroxysmal nocturnal dyspnea or shortness of breath Dermatological: negative for rash Respiratory: negative for cough or wheezing Urologic: negative for hematuria Abdominal: negative for nausea, vomiting, diarrhea, bright red blood per rectum, melena, or hematemesis Neurologic: negative for visual changes, syncope, or dizziness All other systems reviewed and are otherwise negative except as noted above.    Blood pressure 124/76, pulse 75, height 5' 7.5" (1.715 m), weight 249 lb 12.8 oz (113.3 kg), last menstrual period 08/24/2015, SpO2 96%.  General appearance: alert and no distress Neck: no adenopathy, no carotid bruit, no JVD, supple, symmetrical, trachea midline, and thyroid  not enlarged, symmetric, no tenderness/mass/nodules Lungs: clear to auscultation bilaterally Heart: regular rate and rhythm, S1, S2 normal, no murmur, click, rub or gallop Extremities: extremities normal,  atraumatic, no cyanosis or edema Pulses: 2+ and symmetric Skin: Skin color, texture, turgor normal. No rashes or lesions Neurologic: Grossly normal  EKG EKG Interpretation Date/Time:  Tuesday Jun 17 2023 14:41:00 EDT Ventricular Rate:  75 PR Interval:  166 QRS Duration:  138 QT Interval:  450 QTC Calculation: 502 R Axis:   71  Text Interpretation: Normal sinus rhythm Right bundle branch block T wave abnormality, consider inferior ischemia When compared with ECG of 14-Mar-2023 10:32, PREVIOUS ECG IS PRESENT Confirmed by Lauro Portal [phone]) on 06/17/2023 3:08:27 PM    ASSESSMENT AND PLAN:   Essential hypertension History of essential hypertension blood pressure measured today 124/76.  She is on amlodipine  and hydrochlorothiazide .  Atypical chest pain History of atypical chest pain in the past evaluated by Dr. Theodis Fiscal with negative Myoview  stress test performed 11/02/2015.  OSA (obstructive sleep apnea) History of obstructive sleep apnea on CPAP.  Palpitations Ms.  Hymon was referred to me by Abbey Hobby, NP for palpitations.  She had these over the winter months when she was taking care of several of her grandchildren.  She was under a lot of stress at that time.  Her situation has changed and she no longer experiencing palpitations.  At this point, I do not feel compelled to evaluate this with any noninvasive tests or monitoring.     Avanell Leigh MD FACP,FACC,FAHA, [facility] 06/17/2023 3:19 PM

## 2023-06-17 NOTE — Assessment & Plan Note (Signed)
 History of essential hypertension blood pressure measured today 124/76.  She is on amlodipine  and hydrochlorothiazide .

## 2023-06-17 NOTE — Assessment & Plan Note (Signed)
 Crystal Bullock was referred to me by Abbey Hobby, NP for palpitations.  She had these over the winter months when she was taking care of several of her grandchildren.  She was under a lot of stress at that time.  Her situation has changed and she no longer experiencing palpitations.  At this point, I do not feel compelled to evaluate this with any noninvasive tests or monitoring.

## 2023-06-17 NOTE — Patient Instructions (Signed)
 Medication Instructions:  Your physician recommends that you continue on your current medications as directed. Please refer to the Current Medication list given to you today.  *If you need a refill on your cardiac medications before your next appointment, please call your pharmacy*   Follow-Up: At [facility], you and your health needs are our priority.  As part of our continuing mission to provide you with exceptional heart care, our providers are all part of one team.  This team includes your primary Cardiologist (physician) and Advanced Practice Providers or APPs (Physician Assistants and Nurse Practitioners) who all work together to provide you with the care you need, when you need it.  Your next appointment:   We will see you on an as needed basis.  Provider:   Lauro Portal, MD   We recommend signing up for the patient portal called "MyChart".  Sign up information is provided on this After Visit Summary.  MyChart is used to connect with patients for Virtual Visits (Telemedicine).  Patients are able to view lab/test results, encounter notes, upcoming appointments, etc.  Non-urgent messages can be sent to your provider as well.   To learn more about what you can do with MyChart, go to [email or web address].

## 2023-06-17 NOTE — Assessment & Plan Note (Signed)
 History of atypical chest pain in the past evaluated by Dr. Theodis Fiscal with negative Myoview  stress test performed 11/02/2015.

## 2023-06-19 ENCOUNTER — Encounter: Admitting: Physical Medicine & Rehabilitation

## 2023-06-19 ENCOUNTER — Other Ambulatory Visit: Payer: Self-pay

## 2023-06-19 DIAGNOSIS — K651 Peritoneal abscess: Secondary | ICD-10-CM

## 2023-06-19 MED ORDER — AMOXICILLIN-POT CLAVULANATE 875-125 MG PO TABS: 1.0000 | ORAL_TABLET | Freq: Two times a day (BID) | ORAL | 0 refills | Status: DC

## 2023-06-19 NOTE — Telephone Encounter (Signed)
 Please advise [facility]

## 2023-06-23 DIAGNOSIS — Z780 Asymptomatic menopausal state: Secondary | ICD-10-CM | POA: Diagnosis not present

## 2023-06-23 DIAGNOSIS — M542 Cervicalgia: Secondary | ICD-10-CM | POA: Diagnosis not present

## 2023-06-23 DIAGNOSIS — M129 Arthropathy, unspecified: Secondary | ICD-10-CM | POA: Diagnosis not present

## 2023-06-23 DIAGNOSIS — Z1159 Encounter for screening for other viral diseases: Secondary | ICD-10-CM | POA: Diagnosis not present

## 2023-06-23 DIAGNOSIS — G894 Chronic pain syndrome: Secondary | ICD-10-CM | POA: Diagnosis not present

## 2023-06-23 DIAGNOSIS — M545 Low back pain, unspecified: Secondary | ICD-10-CM | POA: Diagnosis not present

## 2023-06-23 DIAGNOSIS — Z79899 Other long term (current) drug therapy: Secondary | ICD-10-CM | POA: Diagnosis not present

## 2023-07-09 ENCOUNTER — Other Ambulatory Visit: Payer: Self-pay | Admitting: Nurse Practitioner

## 2023-07-09 ENCOUNTER — Other Ambulatory Visit: Payer: Self-pay

## 2023-07-09 ENCOUNTER — Telehealth: Payer: Self-pay

## 2023-07-09 DIAGNOSIS — K651 Peritoneal abscess: Secondary | ICD-10-CM

## 2023-07-09 MED ORDER — AMOXICILLIN-POT CLAVULANATE 875-125 MG PO TABS: 1.0000 | ORAL_TABLET | Freq: Two times a day (BID) | ORAL | 0 refills | Status: DC

## 2023-07-09 MED ORDER — DOCUSATE SODIUM 100 MG PO CAPS
100.0000 mg | ORAL_CAPSULE | Freq: Two times a day (BID) | ORAL | 0 refills | Status: DC
Start: 2023-07-09 — End: 2023-08-20

## 2023-07-09 NOTE — Telephone Encounter (Signed)
 Sent to [facility]

## 2023-07-11 ENCOUNTER — Other Ambulatory Visit: Payer: Self-pay | Admitting: Nurse Practitioner

## 2023-07-11 DIAGNOSIS — J209 Acute bronchitis, unspecified: Secondary | ICD-10-CM

## 2023-07-11 MED ORDER — BENZONATATE 200 MG PO CAPS: 200.0000 mg | ORAL_CAPSULE | Freq: Three times a day (TID) | ORAL | 0 refills | Status: DC | PRN

## 2023-07-11 NOTE — Telephone Encounter (Unsigned)
 Copied from CRM [id number]. Topic: Clinical - Medication Refill >> Jul 11, 2023  3:26 PM Carlatta H wrote: Medication: benzonatate  (TESSALON ) 200 MG capsule [[id number]]  Has the patient contacted their pharmacy? No (Agent: If no, request that the patient contact the pharmacy for the refill. If patient does not wish to contact the pharmacy document the reason why and proceed with request.) (Agent: If yes, when and what did the pharmacy advise?)  This is the patient's preferred pharmacy:  [id number] - [place], [place] - [address] AT [address] OF [facility] [address] [place] [address] Phone: [phone] Fax: [phone]  Is this the correct pharmacy for this prescription? Yes If no, delete pharmacy and type the correct one.   Has the prescription been filled recently? No  Is the patient out of the medication? Yes  Has the patient been seen for an appointment in the last year OR does the patient have an upcoming appointment? No  Can we respond through MyChart? Yes  Agent: Please be advised that Rx refills may take up to 3 business days. We ask that you follow-up with your pharmacy.

## 2023-07-13 ENCOUNTER — Other Ambulatory Visit: Payer: Self-pay | Admitting: Nurse Practitioner

## 2023-07-13 DIAGNOSIS — D869 Sarcoidosis, unspecified: Secondary | ICD-10-CM

## 2023-07-13 DIAGNOSIS — J4520 Mild intermittent asthma, uncomplicated: Secondary | ICD-10-CM

## 2023-07-14 NOTE — Telephone Encounter (Signed)
 Please advise [facility]

## 2023-07-21 ENCOUNTER — Emergency Department (HOSPITAL_COMMUNITY)
Admission: EM | Admit: 2023-07-21 | Discharge: 2023-07-21 | Discharge status: Against Medical Advice | Attending: Emergency Medicine | Admitting: Emergency Medicine

## 2023-07-21 ENCOUNTER — Other Ambulatory Visit: Payer: Self-pay

## 2023-07-21 ENCOUNTER — Encounter (HOSPITAL_COMMUNITY): Payer: Self-pay

## 2023-07-21 ENCOUNTER — Ambulatory Visit: Payer: Self-pay

## 2023-07-21 DIAGNOSIS — Z5321 Procedure and treatment not carried out due to patient leaving prior to being seen by health care provider: Secondary | ICD-10-CM | POA: Insufficient documentation

## 2023-07-21 DIAGNOSIS — R519 Headache, unspecified: Secondary | ICD-10-CM | POA: Insufficient documentation

## 2023-07-21 DIAGNOSIS — M545 Low back pain, unspecified: Secondary | ICD-10-CM | POA: Diagnosis not present

## 2023-07-21 LAB — COMPREHENSIVE METABOLIC PANEL WITH GFR
ALT: 18 U/L (ref 0–44)
AST: 23 U/L (ref 15–41)
Albumin: 3.5 g/dL (ref 3.5–5.0)
Alkaline Phosphatase: 40 U/L (ref 38–126)
Anion gap: 10 (ref 5–15)
BUN: 17 mg/dL (ref 6–20)
CO2: 25 mmol/L (ref 22–32)
Calcium: 9.3 mg/dL (ref 8.9–10.3)
Chloride: 102 mmol/L (ref 98–111)
Creatinine, Ser: 1.07 mg/dL — ABNORMAL HIGH (ref 0.44–1.00)
GFR, Estimated: >60 mL/min (ref >60)
Glucose, Bld: 93 mg/dL (ref 70–99)
Potassium: 3.6 mmol/L (ref 3.5–5.1)
Sodium: 137 mmol/L (ref 135–145)
Total Bilirubin: 0.6 mg/dL (ref 0.0–1.2)
Total Protein: 7.7 g/dL (ref 6.5–8.1)

## 2023-07-21 LAB — CBC WITH DIFFERENTIAL/PLATELET
Abs Immature Granulocytes: 0.06 10*3/uL (ref 0.00–0.07)
Basophils Absolute: 0.1 10*3/uL (ref 0.0–0.1)
Basophils Relative: 1 %
Eosinophils Absolute: 0.4 10*3/uL (ref 0.0–0.5)
Eosinophils Relative: 3 %
HCT: 36.3 % (ref 36.0–46.0)
Hemoglobin: 12.4 g/dL (ref 12.0–15.0)
Immature Granulocytes: 1 %
Lymphocytes Relative: 34 %
Lymphs Abs: 4 10*3/uL (ref 0.7–4.0)
MCH: 24.8 pg — ABNORMAL LOW (ref 26.0–34.0)
MCHC: 34.2 g/dL (ref 30.0–36.0)
MCV: 72.7 fL — ABNORMAL LOW (ref 80.0–100.0)
Monocytes Absolute: 0.7 10*3/uL (ref 0.1–1.0)
Monocytes Relative: 6 %
Neutro Abs: 6.7 10*3/uL (ref 1.7–7.7)
Neutrophils Relative %: 55 %
Platelets: 320 10*3/uL (ref 150–400)
RBC: 4.99 MIL/uL (ref 3.87–5.11)
RDW: 14.4 % (ref 11.5–15.5)
WBC: 11.9 10*3/uL — ABNORMAL HIGH (ref 4.0–10.5)
nRBC: 0 % (ref 0.0–0.2)

## 2023-07-21 LAB — C-REACTIVE PROTEIN: CRP: 3 mg/dL — ABNORMAL HIGH (ref <1.0)

## 2023-07-21 LAB — SEDIMENTATION RATE: Sed Rate: 24 mm/h — ABNORMAL HIGH (ref 0–22)

## 2023-07-21 NOTE — ED Notes (Signed)
 Pt advised staff she was leaving. Encouraged to stay. Elected to leave.

## 2023-07-21 NOTE — Telephone Encounter (Signed)
 Pt called from ED stating that she is going to LWBS because she has a backache, but wanted an appt first.. Advised pt will need to put her on hold to discuss with her PCP office. Spoke to Keno and advised of situation. Call warm transferred to office.

## 2023-07-21 NOTE — ED Triage Notes (Signed)
 Pt c/o sharp headache that started 2 days ago. Pt denies nausea, vomiting, dizziness or blurred vision. Pt denies photophobia.

## 2023-07-21 NOTE — Telephone Encounter (Signed)
 FYI Only or Action Required?: FYI only for provider  Patient was last seen in primary care on 04/25/2023 by Jerrlyn Morel, NP. Called Nurse Triage reporting Headache. Symptoms began 2 days ago. Interventions attempted: Nothing. Symptoms are: gradually worsening.  Triage Disposition: Go to ED or PCP/Alternative with Approval  Patient/caregiver understands and will follow disposition?: Yes, will follow disposition   Copied from CRM [id number]. Topic: Clinical - Red Word Triage >> Jul 21, 2023 12:10 PM Essie A wrote: Red Word that prompted transfer to Nurse Triage: Patient is suffering from sharp pains in her head on the temple areas.  The pain stops her in her tracks. Reason for Disposition  [1] SEVERE headache AND [2] sudden-onset (i.e., reaching maximum intensity within seconds to 1 hour)  Answer Assessment - Initial Assessment Questions 1. LOCATION: "Where does it hurt?"      L temple 2. ONSET: "When did the headache start?" (Minutes, hours or days)      About 2 days ago 3. PATTERN: "Does the pain come and go, or has it been constant since it started?"     Intermittent  4. SEVERITY: "How bad is the pain?" and "What does it keep you from doing?"  (e.g., Scale 1-10; mild, moderate, or severe)   - MILD (1-3): doesn't interfere with normal activities    - MODERATE (4-7): interferes with normal activities or awakens from sleep    - SEVERE (8-10): excruciating pain, unable to do any normal activities        10 5. RECURRENT SYMPTOM: "Have you ever had headaches before?" If Yes, ask: "When was the last time?" and "What happened that time?"      denies 6. CAUSE: "What do you think is causing the headache?"     denies 7. MIGRAINE: "Have you been diagnosed with migraine headaches?" If Yes, ask: "Is this headache similar?"      denies 8. HEAD INJURY: "Has there been any recent injury to the head?"      denies 9. OTHER SYMPTOMS: "Do you have any other symptoms?" (fever, stiff neck, eye pain, sore  throat, cold symptoms)     denies  Protocols used: Headache-A-AH Pt states the pain comes and goes, states having approx 9 episodes a day. Pt advised ED, pt agreeable.

## 2023-07-21 NOTE — ED Provider Triage Note (Cosign Needed Addendum)
 Emergency Medicine Provider Triage Evaluation Note  Crystal Bullock , a 58 y.o. female  was evaluated in triage.  Pt complains of sharp stabbing pain left temple on Saturday, now with both temples today. Taking oxycodone  for her chronic back pain which didn't help. No similar headaches previously, no recent head imaging.  Review of Systems  Positive:  Negative: Nausea, vomiting, changes in vision/speech/gait  Physical Exam  BP 127/84 (BP Location: Right Arm)   Pulse 89   Temp 98.8 F (37.1 C) (Oral)   Resp 15   Ht 5\' 7"  (1.702 m)   Wt 109.8 kg   LMP 08/24/2015 Comment: irregular periods per pt 09/12/15  SpO2 97%   BMI 37.90 kg/m  Gen:   Awake, no distress   Resp:  Normal effort  MSK:   Moves extremities without difficulty  Other:  No temporal tenderness   Medical Decision Making  Medically screening exam initiated at 1:31 PM.  Appropriate orders placed.  Crystal Bullock was informed that the remainder of the evaluation will be completed by another provider, this initial triage assessment does not replace that evaluation, and the importance of remaining in the ED until their evaluation is complete.     Darlis Eisenmenger, PA-C 07/21/23 1332    Darlis Eisenmenger, PA-C 07/21/23 1332

## 2023-07-23 DIAGNOSIS — M542 Cervicalgia: Secondary | ICD-10-CM | POA: Diagnosis not present

## 2023-07-23 DIAGNOSIS — M545 Low back pain, unspecified: Secondary | ICD-10-CM | POA: Diagnosis not present

## 2023-07-23 DIAGNOSIS — Z79899 Other long term (current) drug therapy: Secondary | ICD-10-CM | POA: Diagnosis not present

## 2023-07-23 DIAGNOSIS — E790 Hyperuricemia without signs of inflammatory arthritis and tophaceous disease: Secondary | ICD-10-CM | POA: Diagnosis not present

## 2023-07-23 DIAGNOSIS — G894 Chronic pain syndrome: Secondary | ICD-10-CM | POA: Diagnosis not present

## 2023-07-30 ENCOUNTER — Encounter: Payer: Self-pay | Admitting: Nurse Practitioner

## 2023-07-30 ENCOUNTER — Ambulatory Visit (INDEPENDENT_AMBULATORY_CARE_PROVIDER_SITE_OTHER): Payer: Self-pay | Admitting: Nurse Practitioner

## 2023-07-30 VITALS — BP 116/82 | HR 86 | Temp 98.0°F | Wt 249.8 lb

## 2023-07-30 DIAGNOSIS — E119 Type 2 diabetes mellitus without complications: Secondary | ICD-10-CM | POA: Diagnosis not present

## 2023-07-30 DIAGNOSIS — K651 Peritoneal abscess: Secondary | ICD-10-CM | POA: Diagnosis not present

## 2023-07-30 DIAGNOSIS — R519 Headache, unspecified: Secondary | ICD-10-CM

## 2023-07-30 LAB — POCT GLYCOSYLATED HEMOGLOBIN (HGB A1C): Hemoglobin A1C: 7 % — AB (ref 4.0–5.6)

## 2023-07-30 MED ORDER — INDOMETHACIN 50 MG PO CAPS: 50.0000 mg | ORAL_CAPSULE | Freq: Two times a day (BID) | ORAL | 0 refills | Status: DC

## 2023-07-30 MED ORDER — TRIAMCINOLONE ACETONIDE 0.1 % EX CREA
1.0000 | TOPICAL_CREAM | Freq: Two times a day (BID) | CUTANEOUS | 0 refills | Status: DC
Start: 2023-07-30 — End: 2023-09-05

## 2023-07-30 MED ORDER — AMOXICILLIN-POT CLAVULANATE 875-125 MG PO TABS: 1.0000 | ORAL_TABLET | Freq: Two times a day (BID) | ORAL | 0 refills | Status: DC

## 2023-07-30 MED ORDER — KETOROLAC TROMETHAMINE 30 MG/ML IJ SOLN
30.0000 mg | Freq: Once | INTRAMUSCULAR | Status: AC
  Administered 2023-07-30: 30 mg via INTRAMUSCULAR

## 2023-07-30 MED ORDER — DOCUSATE SODIUM 100 MG PO CAPS
100.0000 mg | ORAL_CAPSULE | Freq: Two times a day (BID) | ORAL | 0 refills | Status: DC
Start: 2023-07-30 — End: 2023-10-31

## 2023-07-30 NOTE — Progress Notes (Signed)
 Subjective   Patient ID: Crystal Bullock, female    DOB: [birth year], 58 y.o.   MRN: [id number]  Chief Complaint  Patient presents with   Headache    Patient stated that she has having headaches on both sides of her head for two weeks, sharp pains that don't last long    Referring provider: Jerrlyn Morel, NP  Crystal Bullock is a 58 y.o. female with Past Medical History: 10/06/2015: Anxiety No date: Asthma 10/06/2015: Atypical chest pain No date: Chronic neck pain No date: Fibroid No date: Hypertension 10/06/2015: Palpitations 10/06/2015: RBBB No date: Sarcoidosis No date: Scoliosis No date: Seasonal allergies No date: Sickle cell trait (HCC) 03/14/2023: Type 2 diabetes mellitus without complication, without  long-term current use of insulin (HCC) 05/2019: Vitamin D  deficiency   Headache  This is a new problem. The current episode started 1 to 4 weeks ago. The problem occurs intermittently. The problem has been gradually worsening. The pain is located in the Bilateral region. The pain does not radiate. The pain quality is not similar to prior headaches. The quality of the pain is described as sharp. The pain is moderate. Associated symptoms include ear pain. She has tried NSAIDs for the symptoms.   Patient also complains of a Cutaneous abscess right groin. These are recurrent for her.   She also complains today of wasp sting to left hand that is healing but has associated itching. Will order kenalog  cream.       Allergies  Allergen Reactions   Bee Venom Anaphylaxis   Iodinated Contrast Media Rash    Hives on hands and feet per pt 08/16/15 pt had scan w/ contrast and had a 4 hr premedication protocol done     There is no immunization history on file for this patient.  Tobacco History: Social History   Tobacco Use  Smoking Status Never  Smokeless Tobacco Never   Counseling given: Not Answered   Outpatient Encounter Medications as of 07/30/2023   Medication Sig   Accu-Chek Softclix Lancets lancets by Other route 3 (three) times daily.   albuterol  (VENTOLIN  HFA) 108 (90 Base) MCG/ACT inhaler INHALE 2 PUFFS INTO THE LUNGS EVERY 6 HOURS FOR UP TO 90 DOSES AS NEEDED FOR WHEEZING   amLODipine  (NORVASC ) 10 MG tablet TAKE 1 TABLET(10 MG) BY MOUTH DAILY   amoxicillin -clavulanate (AUGMENTIN ) 875-125 MG tablet Take 1 tablet by mouth 2 (two) times daily.   amoxicillin -clavulanate (AUGMENTIN ) 875-125 MG tablet Take 1 tablet by mouth 2 (two) times daily.   ascorbic acid  (VITAMIN C ) 500 MG tablet Take 1 tablet (500 mg total) by mouth daily.   benzonatate  (TESSALON ) 200 MG capsule Take 1 capsule (200 mg total) by mouth 3 (three) times daily as needed for cough.   Blood Glucose Monitoring Suppl (ACCU-CHEK GUIDE ME) w/Device KIT 3 (three) times daily.   Blood Glucose Monitoring Suppl DEVI 1 each by Does not apply route 2 (two) times daily. May substitute to any manufacturer covered by patient's insurance.   cetirizine  (ZYRTEC ) 10 MG tablet TAKE 1 TABLET BY MOUTH DAILY AS NEEDED FOR ALLERGIES   clindamycin  (CLINDAGEL ) 1 % gel Apply topically 2 (two) times daily.   docusate sodium  (COLACE) 100 MG capsule Take 1 capsule (100 mg total) by mouth 2 (two) times daily.   fluticasone  (FLONASE ) 50 MCG/ACT nasal spray PLACE 2 SPRAYS INTO BOTH NOSTRILS DAILY AS NEEDED. ALLERGIES   gabapentin (NEURONTIN) 300 MG capsule Take 300 mg by mouth as needed.   Glucose  Blood (BLOOD GLUCOSE TEST STRIPS) STRP 1 each by In Vitro route 2 (two) times daily. May substitute to any manufacturer covered by patient's insurance.   indomethacin (INDOCIN) 50 MG capsule Take 1 capsule (50 mg total) by mouth 2 (two) times daily with a meal for 7 days.   Lancet Device MISC 1 each by Does not apply route 2 (two) times daily. May substitute to any manufacturer covered by patient's insurance.   Lancets Misc. MISC 1 each by Does not apply route 2 (two) times daily. May substitute to any  manufacturer covered by patient's insurance.   Multiple Vitamins-Minerals (MULTIVITAMINS THER. W/MINERALS) TABS tablet Take 1 tablet by mouth daily.   oxyCODONE -acetaminophen  (PERCOCET) 10-325 MG tablet Take 1 tablet by mouth every 8 (eight) hours as needed for pain.   triamcinolone  cream (KENALOG ) 0.1 % Apply 1 Application topically 2 (two) times daily.   Vitamin D , Ergocalciferol , (DRISDOL ) 1.25 MG (50000 UNIT) CAPS capsule Take 1 capsule (50,000 Units total) by mouth every 7 (seven) days.   [DISCONTINUED] docusate sodium  (COLACE) 100 MG capsule Take 1 capsule (100 mg total) by mouth 2 (two) times daily.   albuterol  (PROVENTIL ) (2.5 MG/3ML) 0.083% nebulizer solution USE 1 VIAL VIA NEBULIZER EVERY 6 HOURS AS NEEDED FOR WHEEZING OR SHORTNESS OF BREATH (Patient not taking: Reported on 06/17/2023)   baclofen (LIORESAL) 10 MG tablet Take 10 mg by mouth 2 (two) times daily as needed for muscle spasms. (Patient not taking: Reported on 06/17/2023)   docusate sodium  (COLACE) 100 MG capsule Take 1 capsule (100 mg total) by mouth 2 (two) times daily.   esomeprazole  (NEXIUM ) 40 MG capsule Take 1 capsule (40 mg total) by mouth daily. (Patient taking differently: Take 40 mg by mouth as needed.)   hydrochlorothiazide  (MICROZIDE ) 12.5 MG capsule Take 1 capsule (12.5 mg total) by mouth daily. (Patient not taking: Reported on 06/17/2023)   hydrOXYzine  (VISTARIL ) 50 MG capsule Take as prescribed as AVS. (Patient not taking: Reported on 06/17/2023)   LINZESS  145 MCG CAPS capsule  (Patient not taking: Reported on 06/17/2023)   meloxicam (MOBIC) 15 MG tablet Take 15 mg by mouth daily. (Patient not taking: Reported on 06/17/2023)   methocarbamol  (ROBAXIN ) 500 MG tablet Take 1 tablet (500 mg total) by mouth every 6 (six) hours as needed for muscle spasms. (Patient not taking: Reported on 06/17/2023)   methylPREDNISolone  (MEDROL  DOSEPAK) 4 MG TBPK tablet Please take as instructed on the packaging (Patient not taking: Reported on  06/17/2023)   NON FORMULARY CPAP at bedtime (Patient not taking: Reported on 06/17/2023)   nystatin  ointment (MYCOSTATIN ) Apply 1 application. topically 2 (two) times daily. (Patient not taking: Reported on 06/17/2023)   ondansetron  (ZOFRAN ) 4 MG tablet Take 1 tablet (4 mg total) by mouth every 8 (eight) hours as needed for nausea or vomiting. (Patient not taking: Reported on 06/17/2023)   Potassium Bicarbonate 99 MG CAPS Take 99 mg by mouth daily. (Patient not taking: Reported on 06/17/2023)   potassium chloride  SA (KLOR-CON  M) 20 MEQ tablet Take 1 tablet (20 mEq total) by mouth 2 (two) times daily. (Patient not taking: Reported on 06/17/2023)   promethazine  (PHENERGAN ) 12.5 MG tablet Take 12.5 mg by mouth every 8 (eight) hours as needed. (Patient not taking: Reported on 06/17/2023)   Semaglutide  (RYBELSUS ) 3 MG TABS Take 1 tablet (3 mg total) by mouth daily. (Patient not taking: Reported on 06/17/2023)   [EXPIRED] ketorolac  (TORADOL ) 30 MG/ML injection 30 mg    No facility-administered encounter medications on  file as of 07/30/2023.    Review of Systems  Review of Systems  Constitutional: Negative.   HENT:  Positive for ear pain.   Cardiovascular: Negative.   Gastrointestinal: Negative.   Skin:        Abscess right groin and itching to left hand and arm after wasp sting.   Allergic/Immunologic: Negative.   Neurological:  Positive for headaches.  Psychiatric/Behavioral: Negative.       Objective:   BP 116/82   Pulse 86   Temp 98 F (36.7 C) (Oral)   Wt 249 lb 12.8 oz (113.3 kg)   LMP 08/24/2015 Comment: irregular periods per pt 09/12/15  SpO2 98%   BMI 39.12 kg/m   Wt Readings from Last 5 Encounters:  07/30/23 249 lb 12.8 oz (113.3 kg)  07/21/23 242 lb (109.8 kg)  06/17/23 249 lb 12.8 oz (113.3 kg)  04/25/23 248 lb 12.8 oz (112.9 kg)  03/26/23 252 lb (114.3 kg)     Physical Exam Vitals and nursing note reviewed.  Constitutional:      General: She is not in acute distress.     Appearance: She is well-developed.  HENT:     Right Ear: Tympanic membrane normal.   Cardiovascular:     Rate and Rhythm: Normal rate and regular rhythm.  Pulmonary:     Effort: Pulmonary effort is normal.     Breath sounds: Normal breath sounds.   Skin:        Comments: Cutaneous abscess   Neurological:     Mental Status: She is alert and oriented to person, place, and time.       Assessment & Plan:   Type 2 diabetes mellitus without complication, without long-term current use of insulin (HCC) -     POCT glycosylated hemoglobin (Hb A1C)  Left lower quadrant abdominal abscess (HCC) -     Docusate Sodium ; Take 1 capsule (100 mg total) by mouth 2 (two) times daily.  Dispense: 10 capsule; Refill: 0  Nonintractable headache, unspecified chronicity pattern, unspecified headache type -     Ketorolac  Tromethamine  -     Ambulatory referral to Neurology -     Indomethacin; Take 1 capsule (50 mg total) by mouth 2 (two) times daily with a meal for 7 days.  Dispense: 14 capsule; Refill: 0  Other orders -     Triamcinolone  Acetonide; Apply 1 Application topically 2 (two) times daily.  Dispense: 30 g; Refill: 0 -     Amoxicillin -Pot Clavulanate; Take 1 tablet by mouth 2 (two) times daily.  Dispense: 20 tablet; Refill: 0     Return if symptoms worsen or fail to improve.   Jerrlyn Morel, NP 07/30/2023

## 2023-07-30 NOTE — Patient Instructions (Signed)
 1. Type 2 diabetes mellitus without complication, without long-term current use of insulin (HCC) (Primary)  - POCT glycosylated hemoglobin (Hb A1C)  2. Left lower quadrant abdominal abscess (HCC)  - docusate sodium  (COLACE) 100 MG capsule; Take 1 capsule (100 mg total) by mouth 2 (two) times daily.  Dispense: 10 capsule; Refill: 0  3. Nonintractable headache, unspecified chronicity pattern, unspecified headache type  - ketorolac  (TORADOL ) 30 MG/ML injection 30 mg - Ambulatory referral to Neurology - indomethacin (INDOCIN) 50 MG capsule; Take 1 capsule (50 mg total) by mouth 2 (two) times daily with a meal for 7 days.  Dispense: 14 capsule; Refill: 0

## 2023-08-05 ENCOUNTER — Telehealth: Payer: Self-pay | Admitting: Nurse Practitioner

## 2023-08-05 NOTE — Telephone Encounter (Unsigned)
 Copied from CRM [id number]. Topic: Clinical - Medication Refill >> Aug 05, 2023  4:13 PM Zebedee SAUNDERS wrote: Medication: Blood Glucose Monitoring Suppl (ACCU-CHEK GUIDE ME) w/Device KIT Blood Glucose Monitoring Suppl DEVI   Has the patient contacted their pharmacy? Yes (Agent: If no, request that the patient contact the pharmacy for the refill. If patient does not wish to contact the pharmacy document the reason why and proceed with request.) (Agent: If yes, when and what did the pharmacy advise?)  This is the patient's preferred pharmacy:  [id number] - [place], [place] - [address] AT [address] OF [address] [place] [address] Phone: [phone] Fax: [phone]  Is this the correct pharmacy for this prescription? Yes If no, delete pharmacy and type the correct one.   Has the prescription been filled recently? Yes  Is the patient out of the medication? Yes  Has the patient been seen for an appointment in the last year OR does the patient have an upcoming appointment? Yes  Can we respond through MyChart? Yes  Agent: Please be advised that Rx refills may take up to 3 business days. We ask that you follow-up with your pharmacy.

## 2023-08-06 MED ORDER — ACCU-CHEK GUIDE ME W/DEVICE KIT: 1.0000 | PACK | Freq: Three times a day (TID) | 0 refills | Status: DC

## 2023-08-12 ENCOUNTER — Encounter: Payer: Self-pay | Admitting: Physical Medicine & Rehabilitation

## 2023-08-12 ENCOUNTER — Encounter: Attending: Physical Medicine & Rehabilitation | Admitting: Physical Medicine & Rehabilitation

## 2023-08-12 VITALS — BP 137/87 | HR 73 | Ht 67.0 in | Wt 250.0 lb

## 2023-08-12 DIAGNOSIS — M5441 Lumbago with sciatica, right side: Secondary | ICD-10-CM | POA: Diagnosis not present

## 2023-08-12 DIAGNOSIS — M2550 Pain in unspecified joint: Secondary | ICD-10-CM | POA: Diagnosis not present

## 2023-08-12 DIAGNOSIS — G8929 Other chronic pain: Secondary | ICD-10-CM | POA: Diagnosis not present

## 2023-08-12 DIAGNOSIS — J309 Allergic rhinitis, unspecified: Secondary | ICD-10-CM | POA: Insufficient documentation

## 2023-08-12 DIAGNOSIS — F419 Anxiety disorder, unspecified: Secondary | ICD-10-CM | POA: Insufficient documentation

## 2023-08-12 MED ORDER — HYDROXYZINE PAMOATE 50 MG PO CAPS: 50.0000 mg | ORAL_CAPSULE | Freq: Four times a day (QID) | ORAL | 1 refills | Status: DC | PRN

## 2023-08-12 MED ORDER — DULOXETINE HCL 30 MG PO CPEP: 30.0000 mg | ORAL_CAPSULE | Freq: Every day | ORAL | 3 refills | Status: DC
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
## 2023-08-19 DIAGNOSIS — G894 Chronic pain syndrome: Secondary | ICD-10-CM | POA: Diagnosis not present

## 2023-08-19 DIAGNOSIS — Z780 Asymptomatic menopausal state: Secondary | ICD-10-CM | POA: Diagnosis not present

## 2023-08-19 DIAGNOSIS — E790 Hyperuricemia without signs of inflammatory arthritis and tophaceous disease: Secondary | ICD-10-CM | POA: Diagnosis not present

## 2023-08-19 DIAGNOSIS — Z79899 Other long term (current) drug therapy: Secondary | ICD-10-CM | POA: Diagnosis not present

## 2023-08-19 DIAGNOSIS — E119 Type 2 diabetes mellitus without complications: Secondary | ICD-10-CM | POA: Diagnosis not present

## 2023-08-19 DIAGNOSIS — M545 Low back pain, unspecified: Secondary | ICD-10-CM | POA: Diagnosis not present

## 2023-08-19 DIAGNOSIS — M542 Cervicalgia: Secondary | ICD-10-CM | POA: Diagnosis not present

## 2023-08-19 DIAGNOSIS — Z1231 Encounter for screening mammogram for malignant neoplasm of breast: Secondary | ICD-10-CM | POA: Diagnosis not present

## 2023-08-20 ENCOUNTER — Other Ambulatory Visit: Payer: Self-pay | Admitting: Nurse Practitioner

## 2023-08-20 ENCOUNTER — Telehealth: Payer: Self-pay | Admitting: Nurse Practitioner

## 2023-08-20 DIAGNOSIS — K651 Peritoneal abscess: Secondary | ICD-10-CM

## 2023-08-20 DIAGNOSIS — J4520 Mild intermittent asthma, uncomplicated: Secondary | ICD-10-CM

## 2023-08-20 DIAGNOSIS — D869 Sarcoidosis, unspecified: Secondary | ICD-10-CM

## 2023-08-20 DIAGNOSIS — B379 Candidiasis, unspecified: Secondary | ICD-10-CM

## 2023-08-20 MED ORDER — DOCUSATE SODIUM 100 MG PO CAPS: 100.0000 mg | ORAL_CAPSULE | Freq: Two times a day (BID) | ORAL | 0 refills | Status: AC

## 2023-08-20 MED ORDER — ALBUTEROL SULFATE HFA 108 (90 BASE) MCG/ACT IN AERS: INHALATION_SPRAY | RESPIRATORY_TRACT | 1 refills | Status: DC

## 2023-08-20 MED ORDER — CLONAZEPAM 0.5 MG PO TABS: 0.2500 mg | ORAL_TABLET | Freq: Two times a day (BID) | ORAL | 0 refills | Status: DC | PRN

## 2023-08-20 MED ORDER — NYSTATIN 100000 UNIT/GM EX OINT: 1.0000 | TOPICAL_OINTMENT | Freq: Two times a day (BID) | CUTANEOUS | 1 refills | Status: AC

## 2023-08-20 MED ORDER — ALBUTEROL SULFATE (2.5 MG/3ML) 0.083% IN NEBU: INHALATION_SOLUTION | RESPIRATORY_TRACT | 1 refills | Status: DC

## 2023-08-20 NOTE — Telephone Encounter (Signed)
 Copied from CRM [id number]. Topic: Clinical - Medication Refill >> Aug 20, 2023 12:30 PM Carlatta H wrote: Medication: nystatin  ointment (MYCOSTATIN ) Diflucan   docusate sodium  (COLACE) 100 MG capsule amoxicillin -clavulanate (AUGMENTIN ) 875-125 MG tablet albuterol  (VENTOLIN  HFA) 108 (90 Base) MCG/ACT inhaler albuterol  (PROVENTIL ) (2.5 MG/3ML) 0.083% nebulizer solution    Has the patient contacted their pharmacy? No (Agent: If no, request that the patient contact the pharmacy for the refill. If patient does not wish to contact the pharmacy document the reason why and proceed with request.) (Agent: If yes, when and what did the pharmacy advise?)  This is the patient's preferred pharmacy:  [id number] - [place], [place] - [address] AT CORNER OF [facility] [address] [place] [address] Phone: [phone] Fax: [phone]  Is this the correct pharmacy for this prescription? Yes If no, delete pharmacy and type the correct one.   Has the prescription been filled recently? No  Is the patient out of the medication? Yes  Has the patient been seen for an appointment in the last year OR does the patient have an upcoming appointment? Yes  Can we respond through MyChart? Yes  Agent: Please be advised that Rx refills may take up to 3 business days. We ask that you follow-up with your pharmacy.

## 2023-08-26 ENCOUNTER — Telehealth: Payer: Self-pay

## 2023-08-26 NOTE — Telephone Encounter (Signed)
 Copied from CRM [id number]. Topic: Clinical - Medication Question >> Aug 26, 2023  8:55 AM Cleave MATSU wrote: Reason for CRM: pt said she going out of town and she have real bad anxiety she wants to know if Dr. Bascom can sen dher in the nausea patches that go behind ear

## 2023-08-28 ENCOUNTER — Other Ambulatory Visit: Payer: Self-pay | Admitting: Nurse Practitioner

## 2023-08-28 DIAGNOSIS — E559 Vitamin D deficiency, unspecified: Secondary | ICD-10-CM

## 2023-08-28 MED ORDER — VITAMIN D (ERGOCALCIFEROL) 1.25 MG (50000 UNIT) PO CAPS: 50000.0000 [IU] | ORAL_CAPSULE | ORAL | 6 refills | Status: DC

## 2023-08-28 NOTE — Telephone Encounter (Signed)
 Copied from CRM [id number]. Topic: Clinical - Medication Refill >> Aug 28, 2023 10:41 AM DeAngela L wrote: Medication: Vitamin D , Ergocalciferol , (DRISDOL ) 1.25 MG (50000 UNIT) CAPS capsule   Has the patient contacted their pharmacy? Yes  (Agent: If no, request that the patient contact the pharmacy for the refill. If patient does not wish to contact the pharmacy document the reason why and proceed with request.) (Agent: If yes, when and what did the pharmacy advise?)  This is the patient's preferred pharmacy:  [id number] - [place], [place] - [address] AT [address]NER OF [facility] [address] [place] [address] Phone: [phone] Fax: [phone]  Is this the correct pharmacy for this prescription? Yes  If no, delete pharmacy and type the correct one.   Has the prescription been filled recently? Yes   Is the patient out of the medication? Yes   Has the patient been seen for an appointment in the last year OR does the patient have an upcoming appointment? Yes   Can we respond through MyChart? Yes   Agent: Please be advised that Rx refills may take up to 3 business days. We ask that you follow-up with your pharmacy.

## 2023-08-29 ENCOUNTER — Telehealth: Payer: Self-pay

## 2023-08-29 DIAGNOSIS — Z780 Asymptomatic menopausal state: Secondary | ICD-10-CM | POA: Diagnosis not present

## 2023-08-29 NOTE — Telephone Encounter (Signed)
 Copied from CRM [id number]. Topic: Clinical - Medication Question >> Aug 26, 2023  8:55 AM Cleave MATSU wrote: Reason for CRM: pt said she going out of town and she have real bad anxiety she wants to know if Dr. Bascom can sen dher in the nausea patches that go behind ear >> Aug 28, 2023 10:46 AM DeAngela L wrote: Scopolaminetransdermal patch  The patient is going on a cruise and has motion sickness and anxiety and hypertension and she doesn't want to be on a cruise sick and would like to ask if her provider could prescribe her the motion sickness patch so she can take with her on the cruise (pt leaving town next week) Pt num [phone]   [id number] - [place], [place] - [address] AT [address] OF [facility] [address]  [address] [place] [address]  Phone: [phone] Fax: [phone]

## 2023-09-03 ENCOUNTER — Other Ambulatory Visit: Payer: Self-pay | Admitting: Physical Medicine & Rehabilitation

## 2023-09-03 ENCOUNTER — Other Ambulatory Visit: Payer: Self-pay | Admitting: Nurse Practitioner

## 2023-09-03 DIAGNOSIS — J309 Allergic rhinitis, unspecified: Secondary | ICD-10-CM

## 2023-09-03 DIAGNOSIS — F419 Anxiety disorder, unspecified: Secondary | ICD-10-CM

## 2023-09-03 MED ORDER — SCOPOLAMINE 1 MG/3DAYS TD PT72: 1.0000 | MEDICATED_PATCH | TRANSDERMAL | 12 refills | Status: AC

## 2023-09-05 ENCOUNTER — Telehealth: Payer: Self-pay

## 2023-09-05 ENCOUNTER — Other Ambulatory Visit: Payer: Self-pay | Admitting: Nurse Practitioner

## 2023-09-05 NOTE — Telephone Encounter (Signed)
 Please advise [facility]

## 2023-09-05 NOTE — Telephone Encounter (Signed)
 Copied from CRM [id number]. Topic: Clinical - Medication Question >> Sep 05, 2023  1:29 PM Gustabo D wrote: The patch she called in is for 3 days and she wants to know if she can get additional patches for 7 days.

## 2023-09-07 ENCOUNTER — Other Ambulatory Visit: Payer: Self-pay | Admitting: Nurse Practitioner

## 2023-09-10 ENCOUNTER — Other Ambulatory Visit: Payer: Self-pay | Admitting: Nurse Practitioner

## 2023-09-10 DIAGNOSIS — J4520 Mild intermittent asthma, uncomplicated: Secondary | ICD-10-CM

## 2023-09-10 DIAGNOSIS — D869 Sarcoidosis, unspecified: Secondary | ICD-10-CM

## 2023-09-10 MED ORDER — ALBUTEROL SULFATE HFA 108 (90 BASE) MCG/ACT IN AERS: INHALATION_SPRAY | RESPIRATORY_TRACT | 1 refills | Status: DC

## 2023-09-10 MED ORDER — FLUCONAZOLE 100 MG PO TABS: 100.0000 mg | ORAL_TABLET | Freq: Once | ORAL | 0 refills | Status: DC

## 2023-09-10 NOTE — Telephone Encounter (Signed)
 Copied from CRM [id number]. Topic: Clinical - Medication Refill >> Sep 10, 2023 10:05 AM Rosaria E wrote: Medication: albuterol  (VENTOLIN  HFA) 108 (90 Base) MCG/ACT inhaler  Also Diflucan  for yeast infection because she is taking antibiotics   Has the patient contacted their pharmacy? Yes (Agent: If no, request that the patient contact the pharmacy for the refill. If patient does not wish to contact the pharmacy document the reason why and proceed with request.) (Agent: If yes, when and what did the pharmacy advise?)  This is the patient's preferred pharmacy:  [id number] - [place], [place] - [address] AT [address]NER OF [facility] [address] [place] [address] Phone: [phone] Fax: [phone]  Is this the correct pharmacy for this prescription? Yes If no, delete pharmacy and type the correct one.   Has the prescription been filled recently? Yes  Is the patient out of the medication? Yes  Has the patient been seen for an appointment in the last year OR does the patient have an upcoming appointment? Yes  Can we respond through MyChart? Yes  Agent: Please be advised that Rx refills may take up to 3 business days. We ask that you follow-up with your pharmacy.

## 2023-09-22 DIAGNOSIS — Z79899 Other long term (current) drug therapy: Secondary | ICD-10-CM | POA: Diagnosis not present

## 2023-09-22 DIAGNOSIS — G4730 Sleep apnea, unspecified: Secondary | ICD-10-CM | POA: Diagnosis not present

## 2023-09-22 DIAGNOSIS — E790 Hyperuricemia without signs of inflammatory arthritis and tophaceous disease: Secondary | ICD-10-CM | POA: Diagnosis not present

## 2023-09-22 DIAGNOSIS — M545 Low back pain, unspecified: Secondary | ICD-10-CM | POA: Diagnosis not present

## 2023-09-22 DIAGNOSIS — G894 Chronic pain syndrome: Secondary | ICD-10-CM | POA: Diagnosis not present

## 2023-09-22 DIAGNOSIS — M542 Cervicalgia: Secondary | ICD-10-CM | POA: Diagnosis not present

## 2023-09-22 DIAGNOSIS — E119 Type 2 diabetes mellitus without complications: Secondary | ICD-10-CM | POA: Diagnosis not present

## 2023-09-25 DIAGNOSIS — Z79899 Other long term (current) drug therapy: Secondary | ICD-10-CM | POA: Diagnosis not present

## 2023-10-06 ENCOUNTER — Ambulatory Visit: Payer: Self-pay

## 2023-10-06 NOTE — Telephone Encounter (Signed)
Attempted to reach pt, "mailbox is full", unable to leave message

## 2023-10-06 NOTE — Telephone Encounter (Signed)
 FYI Only or Action Required?: FYI only for provider.  Patient was last seen in primary care on 07/30/2023 by Oley Bascom RAMAN, NP.  Called Nurse Triage reporting Recurrent Skin Infections.  Symptoms began 3 days ago.  Interventions attempted: Nothing.  Symptoms are: gradually worsening.  Triage Disposition: See PCP When Office is Open (Within 3 Days)  Patient/caregiver understands and will follow disposition?: No, wishes to speak with PCP      Reason for Triage: Patient has a boil on breast requesting antibiotics and its painful.         Reason for Disposition  [1] Boil AND [2] not improved > 3 days following Care Advice  Answer Assessment - Initial Assessment Questions Patient reports these boils are a chronic problem and has declined an appointment, requesting a refill for Augmentin  be sent to her pharmacy. Please advise.        1. APPEARANCE of BOIL: What does the boil look like?      Red and it's like a knot 2. LOCATION: Where is the boil located?      Left sided breast  3. NUMBER: How many boils are there?      1 4. SIZE: How big is the boil? (e.g., inches, cm; compare to size of a coin or other object)     Dime  5. ONSET: When did the boil start?     3 days ago  6. PAIN: Is there any pain? If Yes, ask: How bad is the pain?   (Scale 1-10; or mild, moderate, severe)     Moderate to severe  7. FEVER: Do you have a fever? If Yes, ask: What is it, how was it measured, and when did it start?      100 F 8. SOURCE: Have you been around anyone with boils or other Staph infections? Have you ever had boils before?     Unsure  9. OTHER SYMPTOMS: Do you have any other symptoms? (e.g., shaking chills, weakness, rash elsewhere on body)     Mild weakness  Protocols used: Boil (Skin Abscess)-A-AH

## 2023-10-10 ENCOUNTER — Emergency Department (HOSPITAL_BASED_OUTPATIENT_CLINIC_OR_DEPARTMENT_OTHER)
Admission: EM | Admit: 2023-10-10 | Discharge: 2023-10-10 | Disposition: A | Discharge status: Home / Self Care | Attending: Emergency Medicine | Admitting: Emergency Medicine

## 2023-10-10 ENCOUNTER — Encounter (HOSPITAL_BASED_OUTPATIENT_CLINIC_OR_DEPARTMENT_OTHER): Payer: Self-pay

## 2023-10-10 ENCOUNTER — Other Ambulatory Visit: Payer: Self-pay

## 2023-10-10 DIAGNOSIS — Z7951 Long term (current) use of inhaled steroids: Secondary | ICD-10-CM | POA: Diagnosis not present

## 2023-10-10 DIAGNOSIS — J45909 Unspecified asthma, uncomplicated: Secondary | ICD-10-CM | POA: Diagnosis not present

## 2023-10-10 DIAGNOSIS — I10 Essential (primary) hypertension: Secondary | ICD-10-CM | POA: Insufficient documentation

## 2023-10-10 DIAGNOSIS — E119 Type 2 diabetes mellitus without complications: Secondary | ICD-10-CM | POA: Insufficient documentation

## 2023-10-10 DIAGNOSIS — E860 Dehydration: Secondary | ICD-10-CM | POA: Diagnosis not present

## 2023-10-10 DIAGNOSIS — Z79899 Other long term (current) drug therapy: Secondary | ICD-10-CM | POA: Insufficient documentation

## 2023-10-10 LAB — CBC WITH DIFFERENTIAL/PLATELET
Abs Immature Granulocytes: 0.04 K/uL (ref 0.00–0.07)
Basophils Absolute: 0.1 K/uL (ref 0.0–0.1)
Basophils Relative: 1 %
Eosinophils Absolute: 0.4 K/uL (ref 0.0–0.5)
Eosinophils Relative: 3 %
HCT: 33.2 % — ABNORMAL LOW (ref 36.0–46.0)
Hemoglobin: 11.5 g/dL — ABNORMAL LOW (ref 12.0–15.0)
Immature Granulocytes: 0 %
Lymphocytes Relative: 28 %
Lymphs Abs: 3 K/uL (ref 0.7–4.0)
MCH: 24.8 pg — ABNORMAL LOW (ref 26.0–34.0)
MCHC: 34.6 g/dL (ref 30.0–36.0)
MCV: 71.6 fL — ABNORMAL LOW (ref 80.0–100.0)
Monocytes Absolute: 0.6 K/uL (ref 0.1–1.0)
Monocytes Relative: 6 %
Neutro Abs: 6.7 K/uL (ref 1.7–7.7)
Neutrophils Relative %: 62 %
Platelets: 295 K/uL (ref 150–400)
RBC: 4.64 MIL/uL (ref 3.87–5.11)
RDW: 14.3 % (ref 11.5–15.5)
WBC: 10.8 K/uL — ABNORMAL HIGH (ref 4.0–10.5)
nRBC: 0 % (ref 0.0–0.2)

## 2023-10-10 LAB — COMPREHENSIVE METABOLIC PANEL WITH GFR
ALT: 15 U/L (ref 0–44)
AST: 21 U/L (ref 15–41)
Albumin: 4 g/dL (ref 3.5–5.0)
Alkaline Phosphatase: 57 U/L (ref 38–126)
Anion gap: 13 (ref 5–15)
BUN: 15 mg/dL (ref 6–20)
CO2: 23 mmol/L (ref 22–32)
Calcium: 9.9 mg/dL (ref 8.9–10.3)
Chloride: 103 mmol/L (ref 98–111)
Creatinine, Ser: 1.09 mg/dL — ABNORMAL HIGH (ref 0.44–1.00)
GFR, Estimated: 59 mL/min — ABNORMAL LOW (ref >60)
Glucose, Bld: 130 mg/dL — ABNORMAL HIGH (ref 70–99)
Potassium: 3.6 mmol/L (ref 3.5–5.1)
Sodium: 138 mmol/L (ref 135–145)
Total Bilirubin: 0.3 mg/dL (ref 0.0–1.2)
Total Protein: 7.5 g/dL (ref 6.5–8.1)

## 2023-10-10 LAB — URINALYSIS, ROUTINE W REFLEX MICROSCOPIC
Bilirubin Urine: NEGATIVE
Glucose, UA: NEGATIVE mg/dL
Hgb urine dipstick: NEGATIVE
Ketones, ur: NEGATIVE mg/dL
Leukocytes,Ua: NEGATIVE
Nitrite: NEGATIVE
Specific Gravity, Urine: 1.027 (ref 1.005–1.030)
pH: 6 (ref 5.0–8.0)

## 2023-10-10 MED ORDER — LACTATED RINGERS IV BOLUS
250.0000 mL | Freq: Once | INTRAVENOUS | Status: AC
  Administered 2023-10-10: 250 mL via INTRAVENOUS

## 2023-10-10 NOTE — Discharge Instructions (Addendum)
 Dehydration, Adult Dehydration is a condition in which there is not enough water or other fluids in the body. This happens when a person loses more fluids than they take in. Important organs cannot work right without the right amount of fluids. Any loss of fluids from the body can cause dehydration. Dehydration can be mild, worse, or very bad. It should be treated right away to keep it from getting very bad. What are the causes? Conditions that cause loss of water in the body. They include: Watery poop (diarrhea). Vomiting. Sweating a lot. Fever. Infection. Peeing (urinating) a lot. Not drinking enough fluids. Certain medicines, such as medicines that take extra fluid out of the body (diuretics). Lack of safe drinking water. Not being able to get enough water and food. What increases the risk? Having a long-term (chronic) illness that has not been treated the right way, such as: Diabetes. Heart disease. Kidney disease. Being 25 years of age or older. Having a disability. Living in a place that is high above the ground or sea (high in altitude). The thinner, drier air causes more fluid loss. Doing exercises that put stress on your body for a long time. Being active when in hot places. What are the signs or symptoms? Symptoms of dehydration depend on how bad it is. Mild or worse dehydration Thirst. Dry lips or dry mouth. Feeling dizzy or light-headed. Muscle cramps. Passing little pee or dark pee. Pee may be the color of tea. Headache. Very bad dehydration Changes in skin. Skin may: Be cold to the touch (clammy). Be blotchy or pale. Not go back to normal right after you pinch it and let it go. Little or no tears, pee, or sweat. Fast breathing. Low blood pressure. Weak pulse. Pulse that is more than 100 beats a minute when you are sitting still. Other changes, such as: Feeling very thirsty. Eyes that look hollow (sunken). Cold hands and feet. Being confused. Being very  tired (lethargic) or having trouble waking from sleep. Losing weight. Loss of consciousness. How is this treated? Treatment for this condition depends on how bad your dehydration is. Treatment should start right away. Do not wait until your condition gets very bad. Very bad dehydration is an emergency. You will need to go to a hospital. Mild or worse dehydration can be treated at home. You may be asked to: Drink more fluids. Drink an oral rehydration solution (ORS). This drink gives you the right amount of fluids, salts, and minerals (electrolytes). Very bad dehydration can be treated: With fluids through an IV tube. By correcting low levels of electrolytes in the body. By treating the problem that caused your dehydration. Follow these instructions at home: Oral rehydration solution If told by your doctor, drink an ORS: Make an ORS. Use instructions on the package. Start by drinking small amounts, about  cup (120 mL) every 5-10 minutes. Slowly drink more until you have had the amount that your doctor said to have.  Eating and drinking  Drink enough clear fluid to keep your pee pale yellow. If you were told to drink an ORS, finish the ORS first. Then, start slowly drinking other clear fluids. Drink fluids such as: Water. Do not drink only water. Doing that can make the salt (sodium) level in your body get too low. Water from ice chips you suck on. Fruit juice that you have added water to (diluted). Low-calorie sports drinks. Eat foods that have the right amounts of salts and minerals, such as bananas, oranges, potatoes,  tomatoes, or spinach. Do not drink alcohol. Avoid drinks that have caffeine or sugar. These include:: High-calorie sports drinks. Fruit juice that you did not add water to. Soda. Coffee or energy drinks. Avoid foods that are greasy or have a lot of fat or sugar. General instructions Take over-the-counter and prescription medicines only as told by your doctor. Do  not take sodium tablets. Doing that can make the salt level in your body get too high. Return to your normal activities as told by your doctor. Ask your doctor what activities are safe for you. Keep all follow-up visits. Your doctor may check and change your treatment. Contact a doctor if: You have pain in your belly (abdomen) and the pain: Gets worse. Stays in one place. You have a rash. You have a stiff neck. You get angry or annoyed more easily than normal. You are more tired or have a harder time waking than normal. You feel weak or dizzy. You feel very thirsty. Get help right away if: You have any symptoms of very bad dehydration. You vomit every time you eat or drink. Your vomiting gets worse, does not go away, or you vomit blood or green stuff. You are getting treatment, but symptoms are getting worse. You have a fever. You have a very bad headache. You have: Diarrhea that gets worse or does not go away. Blood in your poop (stool). This may cause poop to look black and tarry. No pee in 6-8 hours. Only a small amount of pee in 6-8 hours, and the pee is very dark. You have trouble breathing. These symptoms may be an emergency. Get help right away. Call 911. Do not wait to see if the symptoms will go away. Do not drive yourself to the hospital. This information is not intended to replace advice given to you by your health care provider. Make sure you discuss any questions you have with your health care provider. Document Revised: 08/27/2021 Document Reviewed: 08/27/2021 Elsevier Patient Education  2024 [employer].

## 2023-10-10 NOTE — ED Provider Notes (Signed)
 [place] EMERGENCY DEPARTMENT AT [facility] Provider Note  CSN: [id number] Arrival date & time: 10/10/23 1505  Chief Complaint(s) Dehydration  HPI Crystal Bullock is a 58 y.o. female  presenting with self-reported dehydration and requesting intravenous fluids. Patient reports feeling "dehydrated" despite adequate oral fluid intake. Denies vomiting, diarrhea, fever, or other acute symptoms. No prior history of dehydration. Patient's primary care physician recommended evaluation in the hospital. Patient is alert, oriented, and denies other acute complaints.  Past Medical History Past Medical History:  Diagnosis Date   Anxiety 10/06/2015   Asthma    Atypical chest pain 10/06/2015   Chronic neck pain    Fibroid    Hypertension    Palpitations 10/06/2015   RBBB 10/06/2015   Sarcoidosis    Scoliosis    Seasonal allergies    Sickle cell trait (HCC)    Type 2 diabetes mellitus without complication, without long-term current use of insulin (HCC) 03/14/2023   Vitamin D  deficiency 05/2019   Patient Active Problem List   Diagnosis Date Noted   Costochondral chest pain 03/14/2023   Anxiety and depression 03/14/2023   Encounter for examination following treatment at hospital 03/14/2023   RBC microcytosis 03/14/2023   Type 2 diabetes mellitus without complication, without long-term current use of insulin (HCC) 03/14/2023   Acute bronchitis 01/21/2023   Hypokalemia 01/21/2023   Vitamin D  deficiency 03/04/2022   Diverticulitis 12/22/2021   Lumbar radiculopathy 09/26/2021   Class 2 severe obesity with serious comorbidity and body mass index (BMI) of 37.0 to 37.9 in adult, unspecified obesity type (HCC) 03/19/2021   Sickle cell trait (HCC) 03/19/2021   Neck pain, chronic 06/09/2019   Tearfulness 02/22/2019   Prediabetes 02/22/2019   Hyperglycemia 02/22/2019   Hemoglobin A1c less than 7.0% 02/22/2019   Yeast infection 11/11/2018   GERD (gastroesophageal reflux disease)  08/19/2018   Mild tetrahydrocannabinol (THC) abuse 08/19/2018   Hidradenitis suppurativa 06/05/2018   Allergic rhinitis 05/08/2018   OSA (obstructive sleep apnea) 05/13/2017   Low back pain 02/24/2017   Sarcoidosis 10/06/2015   Essential hypertension 10/06/2015   Asthma 10/06/2015   Atypical chest pain 10/06/2015   Anxiety 10/06/2015   Palpitations 10/06/2015   RBBB 10/06/2015   Home Medication(s) Prior to Admission medications   Medication Sig Start Date End Date Taking? Authorizing Provider  Accu-Chek Softclix Lancets lancets by Other route 3 (three) times daily. 05/25/23   [provider]  albuterol  (PROVENTIL ) (2.5 MG/3ML) 0.083% nebulizer solution USE 1 VIAL VIA NEBULIZER EVERY 6 HOURS AS NEEDED FOR WHEEZING OR SHORTNESS OF BREATH 08/20/23   Oley Bascom RAMAN, NP  albuterol  (VENTOLIN  HFA) 108 (90 Base) MCG/ACT inhaler INHALE 2 PUFFS INTO THE LUNGS EVERY 6 HOURS FOR UP TO 90 DOSES AS NEEDED FOR WHEEZING 09/10/23   Oley Bascom RAMAN, NP  amLODipine  (NORVASC ) 10 MG tablet TAKE 1 TABLET(10 MG) BY MOUTH DAILY 03/03/23   Nichols, Tonya S, NP  amoxicillin -clavulanate (AUGMENTIN ) 875-125 MG tablet Take 1 tablet by mouth 2 (two) times daily. 07/09/23   Oley Bascom RAMAN, NP  amoxicillin -clavulanate (AUGMENTIN ) 875-125 MG tablet Take 1 tablet by mouth 2 (two) times daily. 07/30/23   Oley Bascom RAMAN, NP  ascorbic acid  (VITAMIN C ) 500 MG tablet Take 1 tablet (500 mg total) by mouth daily. 01/29/23   Nichols, Tonya S, NP  baclofen (LIORESAL) 10 MG tablet Take 10 mg by mouth 2 (two) times daily as needed for muscle spasms. 03/02/21   [provider]  benzonatate  (TESSALON ) 200 MG  capsule Take 1 capsule (200 mg total) by mouth 3 (three) times daily as needed for cough. 07/11/23   Paseda, Folashade R, FNP  Blood Glucose Monitoring Suppl (ACCU-CHEK GUIDE ME) w/Device KIT 1 EACH BY OTHER ROUTE 3 (THREE) TIMES DAILY. 09/08/23   Oley Bascom RAMAN, NP  Blood Glucose Monitoring Suppl DEVI 1 each by  Does not apply route 2 (two) times daily. May substitute to any manufacturer covered by patient's insurance. 04/09/23   Oley Bascom RAMAN, NP  cetirizine  (ZYRTEC ) 10 MG tablet TAKE 1 TABLET BY MOUTH DAILY AS NEEDED FOR ALLERGIES 11/13/22   Nichols, Tonya S, NP  clindamycin  (CLINDAGEL ) 1 % gel Apply topically 2 (two) times daily. 11/23/21   Oley Bascom RAMAN, NP  clonazePAM  (KLONOPIN ) 0.5 MG tablet Take 0.5 tablets (0.25 mg total) by mouth 2 (two) times daily as needed for anxiety. 08/20/23   Oley Bascom RAMAN, NP  docusate sodium  (COLACE) 100 MG capsule Take 1 capsule (100 mg total) by mouth 2 (two) times daily. 07/30/23   Oley Bascom RAMAN, NP  docusate sodium  (COLACE) 100 MG capsule Take 1 capsule (100 mg total) by mouth 2 (two) times daily. 08/20/23   Oley Bascom RAMAN, NP  DULoxetine  (CYMBALTA ) 30 MG capsule TAKE 1 CAPSULE BY MOUTH EVERY DAY 09/11/23   Urbano Albright, MD  esomeprazole  (NEXIUM ) 40 MG capsule Take 1 capsule (40 mg total) by mouth daily. Patient taking differently: Take 40 mg by mouth as needed. 06/16/23 09/14/23  Nichols, Tonya S, NP  fluticasone  (FLONASE ) 50 MCG/ACT nasal spray PLACE 2 SPRAYS INTO BOTH NOSTRILS DAILY AS NEEDED. ALLERGIES 11/21/22   Nichols, Tonya S, NP  gabapentin (NEURONTIN) 300 MG capsule Take 300 mg by mouth as needed. 10/28/22   [provider]  Glucose Blood (BLOOD GLUCOSE TEST STRIPS) STRP 1 each by In Vitro route 2 (two) times daily. May substitute to any manufacturer covered by patient's insurance. 04/09/23   Oley Bascom RAMAN, NP  hydrochlorothiazide  (MICROZIDE ) 12.5 MG capsule Take 1 capsule (12.5 mg total) by mouth daily. 04/25/23   Oley Bascom RAMAN, NP  hydrOXYzine  (VISTARIL ) 50 MG capsule TAKE 1 CAPSULE (50 MG TOTAL) BY MOUTH EVERY 6 (SIX) HOURS AS NEEDED. TAKE AS PRESCRIBED AS AVS. 09/09/23   Urbano Albright, MD  Lancet Device MISC 1 each by Does not apply route 2 (two) times daily. May substitute to any manufacturer covered by patient's insurance. 04/09/23    Oley Bascom RAMAN, NP  Lancets Misc. MISC 1 each by Does not apply route 2 (two) times daily. May substitute to any manufacturer covered by patient's insurance. 04/09/23   Oley Bascom RAMAN, NP  LINZESS  145 MCG CAPS capsule  12/25/22   [provider]  meloxicam (MOBIC) 15 MG tablet Take 15 mg by mouth daily. 04/26/22   [provider]  methocarbamol  (ROBAXIN ) 500 MG tablet Take 1 tablet (500 mg total) by mouth every 6 (six) hours as needed for muscle spasms. 09/13/21   Armenta Canning, MD  methylPREDNISolone  (MEDROL  DOSEPAK) 4 MG TBPK tablet Please take as instructed on the packaging 01/27/23   Nichols, Tonya S, NP  Multiple Vitamins-Minerals (MULTIVITAMINS THER. W/MINERALS) TABS tablet Take 1 tablet by mouth daily. 04/17/23   Oley Bascom RAMAN, NP  NON FORMULARY CPAP at bedtime    [provider]  nystatin  ointment (MYCOSTATIN ) Apply 1 Application topically 2 (two) times daily. 08/20/23   Oley Bascom RAMAN, NP  ondansetron  (ZOFRAN ) 4 MG tablet Take 1 tablet (4 mg total) by mouth every  8 (eight) hours as needed for nausea or vomiting. 01/29/23   Oley Bascom RAMAN, NP  oxyCODONE -acetaminophen  (PERCOCET) 10-325 MG tablet Take 1 tablet by mouth every 8 (eight) hours as needed for pain.    [provider]  Potassium Bicarbonate 99 MG CAPS Take 99 mg by mouth daily.    [provider]  potassium chloride  SA (KLOR-CON  M) 20 MEQ tablet Take 1 tablet (20 mEq total) by mouth 2 (two) times daily. 03/12/23   Oley Bascom RAMAN, NP  promethazine  (PHENERGAN ) 12.5 MG tablet Take 12.5 mg by mouth every 8 (eight) hours as needed. 04/11/23   [provider]  scopolamine  (TRANSDERM-SCOP) 1 MG/3DAYS Place 1 patch (1.5 mg total) onto the skin every 3 (three) days. 09/03/23   Oley Bascom RAMAN, NP  Semaglutide  (RYBELSUS ) 3 MG TABS Take 1 tablet (3 mg total) by mouth daily. 04/09/23   Oley Bascom RAMAN, NP  triamcinolone  cream (KENALOG ) 0.1 % APPLY TO AFFECTED AREA TWICE A DAY 09/05/23    Oley Bascom RAMAN, NP  Vitamin D , Ergocalciferol , (DRISDOL ) 1.25 MG (50000 UNIT) CAPS capsule Take 1 capsule (50,000 Units total) by mouth every 7 (seven) days. 08/28/23   Oley Bascom RAMAN, NP                                                                                                                                    Past Surgical History Past Surgical History:  Procedure Laterality Date   AXILLARY LYMPH NODE DISSECTION     CHOLECYSTECTOMY     Family History Family History  Problem Relation Age of Onset   Hypertension Mother    Diabetes Mother    Liver disease Neg Hx    Esophageal cancer Neg Hx    Colon cancer Neg Hx     Social History Social History   Tobacco Use   Smoking status: Never   Smokeless tobacco: Never  Vaping Use   Vaping status: Never Used  Substance Use Topics   Alcohol use: Yes    Alcohol/week: 1.0 standard drink of alcohol    Types: 1 Glasses of wine per week    Comment: occasional/holidays   Drug use: Not Currently    Types: Marijuana    Comment: once a week   Allergies Bee venom and Iodinated contrast media  Review of Systems A thorough review of systems was obtained and all systems are negative except as noted in the HPI and PMH.   Physical Exam Vital Signs  I have reviewed the triage vital signs BP 126/75 (BP Location: Right Arm)   Pulse 81   Temp 98.7 F (37.1 C) (Oral)   Resp 20   LMP 08/24/2015 Comment: irregular periods per pt 09/12/15  SpO2 95%  General: Alert, no acute distress.  HEENT: Soft, supple, with full range of motion.  Cardiac: Regular rate and rhythm, normal S1 and S2, no murmurs, rubs, or gallops. Lungs: Clear  to auscultation bilaterally in all fields, with no wheezes or crackles appreciated.  Abdomen: Non-distended, soft Extremities: Warm and well-perfused Neurological: Alert and oriented 4. Skin: No rashes noted.  ED Results and Treatments Labs (all labs ordered are listed, but only abnormal results are  displayed) Labs Reviewed  COMPREHENSIVE METABOLIC PANEL WITH GFR - Abnormal; Notable for the following components:      Result Value   Glucose, Bld 130 (*)    Creatinine, Ser 1.09 (*)    GFR, Estimated 59 (*)    All other components within normal limits  CBC WITH DIFFERENTIAL/PLATELET - Abnormal; Notable for the following components:   WBC 10.8 (*)    Hemoglobin 11.5 (*)    HCT 33.2 (*)    MCV 71.6 (*)    MCH 24.8 (*)    All other components within normal limits  URINALYSIS, ROUTINE W REFLEX MICROSCOPIC                                                                                                                          Radiology No results found.  Pertinent labs & imaging results that were available during my care of the patient were reviewed by me and considered in my medical decision making (see MDM for details).  Medications Ordered in ED Medications - No data to display                                                                                                                                   Procedures Procedures  (including critical care time)  Medical Decision Making / ED Course    Medical Decision Making:    Crystal Bullock is a 58 y.o. female with history of hypertension, sarcoidosis came here with chief complaint of dehydration.  She denied vomiting, diarrhea, urinary frequency.  On exam she did not lose teeth, negative for sunken eye, normal skin turgor.  Lab Tests: -I ordered, reviewed, and interpreted labs.   The pertinent results include:   Labs Reviewed  COMPREHENSIVE METABOLIC PANEL WITH GFR - Abnormal; Notable for the following components:      Result Value   Glucose, Bld 130 (*)    Creatinine, Ser 1.09 (*)    GFR, Estimated 59 (*)    All other components within normal limits  CBC WITH DIFFERENTIAL/PLATELET - Abnormal; Notable for the following components:   WBC 10.8 (*)  Hemoglobin 11.5 (*)    HCT 33.2 (*)    MCV 71.6 (*)    MCH  24.8 (*)    All other components within normal limits  URINALYSIS, ROUTINE W REFLEX MICROSCOPIC - Abnormal; Notable for the following components:   Protein, ur TRACE (*)    All other components within normal limits     Medicines ordered and prescription drug management: No orders of the defined types were placed in this encounter.   - LR 250/1 hr  Card Reevaluation: After the interventions noted above, I reevaluated the patient and found that they have resolved  Co morbidities that complicate the patient evaluation  Past Medical History:  Diagnosis Date   Anxiety 10/06/2015   Asthma    Atypical chest pain 10/06/2015   Chronic neck pain    Fibroid    Hypertension    Palpitations 10/06/2015   RBBB 10/06/2015   Sarcoidosis    Scoliosis    Seasonal allergies    Sickle cell trait (HCC)    Type 2 diabetes mellitus without complication, without long-term current use of insulin (HCC) 03/14/2023   Vitamin D  deficiency 05/2019      Dispostion: Disposition decision including need for hospitalization was considered, and patient discharged from emergency department.    Final Clinical Impression(s) / ED Diagnoses Final diagnoses:  None        Bernadine Manos, MD 10/10/23 1744    Lenor Hollering, MD 10/10/23 [phone]

## 2023-10-10 NOTE — ED Triage Notes (Signed)
 Pt c/o dehydration- my lips are dry, I feel fatigued- I got all the symptoms. Denies CP, SHOB, fever, pain

## 2023-10-14 ENCOUNTER — Other Ambulatory Visit: Payer: Self-pay | Admitting: Nurse Practitioner

## 2023-10-14 DIAGNOSIS — Z1231 Encounter for screening mammogram for malignant neoplasm of breast: Secondary | ICD-10-CM

## 2023-10-20 ENCOUNTER — Encounter: Admitting: Physical Medicine & Rehabilitation

## 2023-10-22 ENCOUNTER — Ambulatory Visit
Admission: RE | Admit: 2023-10-22 | Discharge: 2023-10-22 | Disposition: A | Discharge status: Home / Self Care | Source: Ambulatory Visit | Attending: Nurse Practitioner | Admitting: Nurse Practitioner

## 2023-10-22 DIAGNOSIS — Z1231 Encounter for screening mammogram for malignant neoplasm of breast: Secondary | ICD-10-CM

## 2023-10-23 DIAGNOSIS — G894 Chronic pain syndrome: Secondary | ICD-10-CM | POA: Diagnosis not present

## 2023-10-23 DIAGNOSIS — M542 Cervicalgia: Secondary | ICD-10-CM | POA: Diagnosis not present

## 2023-10-23 DIAGNOSIS — M545 Low back pain, unspecified: Secondary | ICD-10-CM | POA: Diagnosis not present

## 2023-10-23 DIAGNOSIS — Z79899 Other long term (current) drug therapy: Secondary | ICD-10-CM | POA: Diagnosis not present

## 2023-10-23 DIAGNOSIS — I10 Essential (primary) hypertension: Secondary | ICD-10-CM | POA: Diagnosis not present

## 2023-10-31 ENCOUNTER — Encounter: Payer: Self-pay | Admitting: Nurse Practitioner

## 2023-10-31 ENCOUNTER — Ambulatory Visit (INDEPENDENT_AMBULATORY_CARE_PROVIDER_SITE_OTHER): Payer: Self-pay | Admitting: Nurse Practitioner

## 2023-10-31 VITALS — BP 153/90 | HR 84 | Wt 244.6 lb

## 2023-10-31 DIAGNOSIS — E119 Type 2 diabetes mellitus without complications: Secondary | ICD-10-CM | POA: Diagnosis not present

## 2023-10-31 DIAGNOSIS — K651 Peritoneal abscess: Secondary | ICD-10-CM | POA: Diagnosis not present

## 2023-10-31 DIAGNOSIS — L732 Hidradenitis suppurativa: Secondary | ICD-10-CM

## 2023-10-31 DIAGNOSIS — D219 Benign neoplasm of connective and other soft tissue, unspecified: Secondary | ICD-10-CM

## 2023-10-31 LAB — POCT GLYCOSYLATED HEMOGLOBIN (HGB A1C): Hemoglobin A1C: 6.8 % — AB (ref 4.0–5.6)

## 2023-10-31 MED ORDER — DOCUSATE SODIUM 100 MG PO CAPS: 100.0000 mg | ORAL_CAPSULE | Freq: Two times a day (BID) | ORAL | 3 refills | Status: AC

## 2023-10-31 MED ORDER — DOCUSATE SODIUM 100 MG PO CAPS: 100.0000 mg | ORAL_CAPSULE | Freq: Two times a day (BID) | ORAL | 0 refills | Status: DC

## 2023-10-31 NOTE — Progress Notes (Addendum)
 Subjective   Patient ID: Crystal Bullock, female    DOB: 01-28-1966, 58 y.o.   MRN: [id number]  Chief Complaint  Patient presents with   Diabetes    Referring provider: Oley Bascom RAMAN, NP  Crystal Bullock is a 58 y.o. female with Past Medical History: 10/06/2015: Anxiety No date: Asthma 10/06/2015: Atypical chest pain No date: Chronic neck pain No date: Fibroid No date: Hypertension 10/06/2015: Palpitations 10/06/2015: RBBB No date: Sarcoidosis No date: Scoliosis No date: Seasonal allergies No date: Sickle cell trait (HCC) 03/14/2023: Type 2 diabetes mellitus without complication, without  long-term current use of insulin (HCC) 05/2019: Vitamin D  deficiency   HPI  Hydradenitis suppurative:    Referral was placed to dermatology alt visit for hidradenitis suppurative. Has appointment scheduled.    Diabetes:   A1c in office today 6.8.  Patient states that Ozempic  is making her nauseated.  We will place a referral to pharmacy for medication management. denies f/c/s, n/v/d, hemoptysis, PND, leg swelling. Denies chest pain or edema.    Fibroids:  History of fibroids. Is requesting referral to ob/gyn   Constipation:  Does need refill on colace.   Asthma:  Patient states that she does need a new nebulizer machine ordered.  She does use this intermittently for asthma.  We will order a new machine today.    Allergies  Allergen Reactions   Bee Venom Anaphylaxis   Iodinated Contrast Media Rash    Hives on hands and feet per pt 08/16/15 pt had scan w/ contrast and had a 4 hr premedication protocol done     There is no immunization history on file for this patient.  Tobacco History: Social History   Tobacco Use  Smoking Status Never  Smokeless Tobacco Never   Counseling given: Not Answered   Outpatient Encounter Medications as of 10/31/2023  Medication Sig   Accu-Chek Softclix Lancets lancets by Other route 3 (three) times daily.   albuterol   (PROVENTIL ) (2.5 MG/3ML) 0.083% nebulizer solution USE 1 VIAL VIA NEBULIZER EVERY 6 HOURS AS NEEDED FOR WHEEZING OR SHORTNESS OF BREATH   albuterol  (VENTOLIN  HFA) 108 (90 Base) MCG/ACT inhaler INHALE 2 PUFFS INTO THE LUNGS EVERY 6 HOURS FOR UP TO 90 DOSES AS NEEDED FOR WHEEZING   amLODipine  (NORVASC ) 10 MG tablet TAKE 1 TABLET(10 MG) BY MOUTH DAILY   amoxicillin -clavulanate (AUGMENTIN ) 875-125 MG tablet Take 1 tablet by mouth 2 (two) times daily.   amoxicillin -clavulanate (AUGMENTIN ) 875-125 MG tablet Take 1 tablet by mouth 2 (two) times daily.   ascorbic acid  (VITAMIN C ) 500 MG tablet Take 1 tablet (500 mg total) by mouth daily.   baclofen (LIORESAL) 10 MG tablet Take 10 mg by mouth 2 (two) times daily as needed for muscle spasms.   benzonatate  (TESSALON ) 200 MG capsule Take 1 capsule (200 mg total) by mouth 3 (three) times daily as needed for cough.   Blood Glucose Monitoring Suppl (ACCU-CHEK GUIDE ME) w/Device KIT 1 EACH BY OTHER ROUTE 3 (THREE) TIMES DAILY.   Blood Glucose Monitoring Suppl DEVI 1 each by Does not apply route 2 (two) times daily. May substitute to any manufacturer covered by patient's insurance.   cetirizine  (ZYRTEC ) 10 MG tablet TAKE 1 TABLET BY MOUTH DAILY AS NEEDED FOR ALLERGIES   clindamycin  (CLINDAGEL ) 1 % gel Apply topically 2 (two) times daily.   clonazePAM  (KLONOPIN ) 0.5 MG tablet Take 0.5 tablets (0.25 mg total) by mouth 2 (two) times daily as needed for anxiety.   docusate sodium  (COLACE)  100 MG capsule Take 1 capsule (100 mg total) by mouth 2 (two) times daily.   DULoxetine  (CYMBALTA ) 30 MG capsule TAKE 1 CAPSULE BY MOUTH EVERY DAY   esomeprazole  (NEXIUM ) 40 MG capsule Take 1 capsule (40 mg total) by mouth daily.   fluticasone  (FLONASE ) 50 MCG/ACT nasal spray PLACE 2 SPRAYS INTO BOTH NOSTRILS DAILY AS NEEDED. ALLERGIES   gabapentin (NEURONTIN) 300 MG capsule Take 300 mg by mouth as needed.   Glucose Blood (BLOOD GLUCOSE TEST STRIPS) STRP 1 each by In Vitro route 2  (two) times daily. May substitute to any manufacturer covered by patient's insurance.   hydrochlorothiazide  (MICROZIDE ) 12.5 MG capsule Take 1 capsule (12.5 mg total) by mouth daily.   hydrOXYzine  (VISTARIL ) 50 MG capsule TAKE 1 CAPSULE (50 MG TOTAL) BY MOUTH EVERY 6 (SIX) HOURS AS NEEDED. TAKE AS PRESCRIBED AS AVS.   Lancet Device MISC 1 each by Does not apply route 2 (two) times daily. May substitute to any manufacturer covered by patient's insurance.   Lancets Misc. MISC 1 each by Does not apply route 2 (two) times daily. May substitute to any manufacturer covered by patient's insurance.   LINZESS  145 MCG CAPS capsule    meloxicam (MOBIC) 15 MG tablet Take 15 mg by mouth daily.   methocarbamol  (ROBAXIN ) 500 MG tablet Take 1 tablet (500 mg total) by mouth every 6 (six) hours as needed for muscle spasms.   Multiple Vitamins-Minerals (MULTIVITAMINS THER. W/MINERALS) TABS tablet Take 1 tablet by mouth daily.   NON FORMULARY CPAP at bedtime   nystatin  ointment (MYCOSTATIN ) Apply 1 Application topically 2 (two) times daily.   ondansetron  (ZOFRAN ) 4 MG tablet Take 1 tablet (4 mg total) by mouth every 8 (eight) hours as needed for nausea or vomiting.   oxyCODONE -acetaminophen  (PERCOCET) 10-325 MG tablet Take 1 tablet by mouth every 8 (eight) hours as needed for pain.   Potassium Bicarbonate 99 MG CAPS Take 99 mg by mouth daily.   potassium chloride  SA (KLOR-CON  M) 20 MEQ tablet Take 1 tablet (20 mEq total) by mouth 2 (two) times daily.   promethazine  (PHENERGAN ) 12.5 MG tablet Take 12.5 mg by mouth every 8 (eight) hours as needed.   scopolamine  (TRANSDERM-SCOP) 1 MG/3DAYS Place 1 patch (1.5 mg total) onto the skin every 3 (three) days.   Semaglutide  (RYBELSUS ) 3 MG TABS Take 1 tablet (3 mg total) by mouth daily.   triamcinolone  cream (KENALOG ) 0.1 % APPLY TO AFFECTED AREA TWICE A DAY   Vitamin D , Ergocalciferol , (DRISDOL ) 1.25 MG (50000 UNIT) CAPS capsule Take 1 capsule (50,000 Units total) by mouth  every 7 (seven) days.   [DISCONTINUED] docusate sodium  (COLACE) 100 MG capsule Take 1 capsule (100 mg total) by mouth 2 (two) times daily.   docusate sodium  (COLACE) 100 MG capsule Take 1 capsule (100 mg total) by mouth 2 (two) times daily.   methylPREDNISolone  (MEDROL  DOSEPAK) 4 MG TBPK tablet Please take as instructed on the packaging   [DISCONTINUED] docusate sodium  (COLACE) 100 MG capsule Take 1 capsule (100 mg total) by mouth 2 (two) times daily.   No facility-administered encounter medications on file as of 10/31/2023.    Review of Systems  Review of Systems  Constitutional: Negative.   HENT: Negative.    Cardiovascular: Negative.   Gastrointestinal: Negative.   Allergic/Immunologic: Negative.   Neurological: Negative.   Psychiatric/Behavioral: Negative.       Objective:   BP (!) 153/90   Pulse 84   Wt 244 lb 9.6 oz (  110.9 kg)   LMP 08/24/2015 Comment: irregular periods per pt 09/12/15  SpO2 97%   BMI 38.31 kg/m   Wt Readings from Last 5 Encounters:  10/31/23 244 lb 9.6 oz (110.9 kg)  08/12/23 250 lb (113.4 kg)  07/30/23 249 lb 12.8 oz (113.3 kg)  07/21/23 242 lb (109.8 kg)  06/17/23 249 lb 12.8 oz (113.3 kg)     Physical Exam Vitals and nursing note reviewed.  Constitutional:      General: She is not in acute distress.    Appearance: She is well-developed.  Cardiovascular:     Rate and Rhythm: Normal rate and regular rhythm.  Pulmonary:     Effort: Pulmonary effort is normal.     Breath sounds: Normal breath sounds.  Neurological:     Mental Status: She is alert and oriented to person, place, and time.       Assessment & Plan:   Type 2 diabetes mellitus without complication, without long-term current use of insulin (HCC) -     POCT glycosylated hemoglobin (Hb A1C)  Hidradenitis suppurativa of multiple sites -     Ambulatory referral to Dermatology  Fibroids -     Ambulatory referral to Obstetrics / Gynecology  Left lower quadrant abdominal  abscess (HCC) -     Docusate Sodium ; Take 1 capsule (100 mg total) by mouth 2 (two) times daily.  Dispense: 60 capsule; Refill: 3     Return in about 3 months (around 01/30/2024).     Bascom GORMAN Borer, NP 10/31/2023

## 2023-11-02 ENCOUNTER — Other Ambulatory Visit: Payer: Self-pay | Admitting: Physical Medicine & Rehabilitation

## 2023-11-02 DIAGNOSIS — J309 Allergic rhinitis, unspecified: Secondary | ICD-10-CM

## 2023-11-02 DIAGNOSIS — F419 Anxiety disorder, unspecified: Secondary | ICD-10-CM

## 2023-11-06 ENCOUNTER — Ambulatory Visit (INDEPENDENT_AMBULATORY_CARE_PROVIDER_SITE_OTHER): Payer: Self-pay

## 2023-11-06 DIAGNOSIS — Z0000 Encounter for general adult medical examination without abnormal findings: Secondary | ICD-10-CM | POA: Diagnosis not present

## 2023-11-06 NOTE — Progress Notes (Signed)
 Subjective:   Crystal Bullock is a 58 y.o. who presents for a Medicare Wellness preventive visit.  As a reminder, Annual Wellness Visits don't include a physical exam, and some assessments may be limited, especially if this visit is performed virtually. We may recommend an in-person follow-up visit with your provider if needed.  Visit Complete: Virtual I connected with  Crystal Bullock on 11/06/23 by a video and audio enabled telemedicine application and verified that I am speaking with the correct person using two identifiers.  Patient Location: Home  Provider Location: Home Office  I discussed the limitations of evaluation and management by telemedicine. The patient expressed understanding and agreed to proceed.  Vital Signs: Because this visit was a virtual/telehealth visit, some criteria may be missing or patient reported. Any vitals not documented were not able to be obtained and vitals that have been documented are patient reported.    Persons Participating in Visit: Patient.  AWV Questionnaire: No: Patient Medicare AWV questionnaire was not completed prior to this visit.  Cardiac Risk Factors include: advanced age (>6men, >31 women);diabetes mellitus;hypertension     Objective:    Today's Vitals   11/06/23 0828  PainSc: 6    There is no height or weight on file to calculate BMI.     11/06/2023    8:42 AM 07/21/2023    1:28 PM 03/09/2023    5:53 AM 12/13/2022    8:13 AM 12/13/2022    7:56 AM 12/02/2022   11:02 AM 12/22/2021   11:16 PM  Advanced Directives  Does Patient Have a Medical Advance Directive? No No Yes No No No No  Would patient like information on creating a medical advance directive? No - Patient declined      No - Patient declined    Current Medications (verified) Outpatient Encounter Medications as of 11/06/2023  Medication Sig   Accu-Chek Softclix Lancets lancets by Other route 3 (three) times daily.   albuterol  (PROVENTIL ) (2.5 MG/3ML)  0.083% nebulizer solution USE 1 VIAL VIA NEBULIZER EVERY 6 HOURS AS NEEDED FOR WHEEZING OR SHORTNESS OF BREATH   albuterol  (VENTOLIN  HFA) 108 (90 Base) MCG/ACT inhaler INHALE 2 PUFFS INTO THE LUNGS EVERY 6 HOURS FOR UP TO 90 DOSES AS NEEDED FOR WHEEZING   amLODipine  (NORVASC ) 10 MG tablet TAKE 1 TABLET(10 MG) BY MOUTH DAILY   ascorbic acid  (VITAMIN C ) 500 MG tablet Take 1 tablet (500 mg total) by mouth daily.   baclofen (LIORESAL) 10 MG tablet Take 10 mg by mouth 2 (two) times daily as needed for muscle spasms.   benzonatate  (TESSALON ) 200 MG capsule Take 1 capsule (200 mg total) by mouth 3 (three) times daily as needed for cough.   Blood Glucose Monitoring Suppl (ACCU-CHEK GUIDE ME) w/Device KIT 1 EACH BY OTHER ROUTE 3 (THREE) TIMES DAILY.   Blood Glucose Monitoring Suppl DEVI 1 each by Does not apply route 2 (two) times daily. May substitute to any manufacturer covered by patient's insurance.   cetirizine  (ZYRTEC ) 10 MG tablet TAKE 1 TABLET BY MOUTH DAILY AS NEEDED FOR ALLERGIES   clindamycin  (CLINDAGEL ) 1 % gel Apply topically 2 (two) times daily.   clonazePAM  (KLONOPIN ) 0.5 MG tablet Take 0.5 tablets (0.25 mg total) by mouth 2 (two) times daily as needed for anxiety.   docusate sodium  (COLACE) 100 MG capsule Take 1 capsule (100 mg total) by mouth 2 (two) times daily.   docusate sodium  (COLACE) 100 MG capsule Take 1 capsule (100 mg total) by mouth  2 (two) times daily.   DULoxetine  (CYMBALTA ) 30 MG capsule TAKE 1 CAPSULE BY MOUTH EVERY DAY   esomeprazole  (NEXIUM ) 40 MG capsule Take 1 capsule (40 mg total) by mouth daily.   fluticasone  (FLONASE ) 50 MCG/ACT nasal spray PLACE 2 SPRAYS INTO BOTH NOSTRILS DAILY AS NEEDED. ALLERGIES (Patient taking differently: Place 2 sprays into both nostrils daily. allergies)   gabapentin (NEURONTIN) 300 MG capsule Take 300 mg by mouth as needed.   Glucose Blood (BLOOD GLUCOSE TEST STRIPS) STRP 1 each by In Vitro route 2 (two) times daily. May substitute to any  manufacturer covered by patient's insurance.   hydrOXYzine  (VISTARIL ) 50 MG capsule TAKE 1 CAPSULE (50 MG TOTAL) BY MOUTH EVERY 6 (SIX) HOURS AS NEEDED. TAKE AS PRESCRIBED AS AVS.   Lancet Device MISC 1 each by Does not apply route 2 (two) times daily. May substitute to any manufacturer covered by patient's insurance.   Lancets Misc. MISC 1 each by Does not apply route 2 (two) times daily. May substitute to any manufacturer covered by patient's insurance.   LINZESS  145 MCG CAPS capsule    meloxicam (MOBIC) 15 MG tablet Take 15 mg by mouth daily.   methocarbamol  (ROBAXIN ) 500 MG tablet Take 1 tablet (500 mg total) by mouth every 6 (six) hours as needed for muscle spasms.   Multiple Vitamins-Minerals (MULTIVITAMINS THER. W/MINERALS) TABS tablet Take 1 tablet by mouth daily.   NON FORMULARY CPAP at bedtime   nystatin  ointment (MYCOSTATIN ) Apply 1 Application topically 2 (two) times daily.   ondansetron  (ZOFRAN ) 4 MG tablet Take 1 tablet (4 mg total) by mouth every 8 (eight) hours as needed for nausea or vomiting.   oxyCODONE -acetaminophen  (PERCOCET) 10-325 MG tablet Take 1 tablet by mouth every 8 (eight) hours as needed for pain.   promethazine  (PHENERGAN ) 12.5 MG tablet Take 12.5 mg by mouth every 8 (eight) hours as needed.   triamcinolone  cream (KENALOG ) 0.1 % APPLY TO AFFECTED AREA TWICE A DAY   Vitamin D , Ergocalciferol , (DRISDOL ) 1.25 MG (50000 UNIT) CAPS capsule Take 1 capsule (50,000 Units total) by mouth every 7 (seven) days.   amoxicillin -clavulanate (AUGMENTIN ) 875-125 MG tablet Take 1 tablet by mouth 2 (two) times daily. (Patient not taking: Reported on 11/06/2023)   amoxicillin -clavulanate (AUGMENTIN ) 875-125 MG tablet Take 1 tablet by mouth 2 (two) times daily. (Patient not taking: Reported on 11/06/2023)   hydrochlorothiazide  (MICROZIDE ) 12.5 MG capsule Take 1 capsule (12.5 mg total) by mouth daily. (Patient not taking: Reported on 11/06/2023)   methylPREDNISolone  (MEDROL  DOSEPAK) 4 MG TBPK  tablet Please take as instructed on the packaging (Patient not taking: Reported on 11/06/2023)   Potassium Bicarbonate 99 MG CAPS Take 99 mg by mouth daily. (Patient not taking: Reported on 11/06/2023)   potassium chloride  SA (KLOR-CON  M) 20 MEQ tablet Take 1 tablet (20 mEq total) by mouth 2 (two) times daily. (Patient not taking: Reported on 11/06/2023)   scopolamine  (TRANSDERM-SCOP) 1 MG/3DAYS Place 1 patch (1.5 mg total) onto the skin every 3 (three) days. (Patient not taking: Reported on 11/06/2023)   Semaglutide  (RYBELSUS ) 3 MG TABS Take 1 tablet (3 mg total) by mouth daily. (Patient not taking: Reported on 11/06/2023)   No facility-administered encounter medications on file as of 11/06/2023.    Allergies (verified) Bee venom and Iodinated contrast media   History: Past Medical History:  Diagnosis Date   Anxiety 10/06/2015   Asthma    Atypical chest pain 10/06/2015   Chronic neck pain    Fibroid  Hypertension    Palpitations 10/06/2015   RBBB 10/06/2015   Sarcoidosis    Scoliosis    Seasonal allergies    Sickle cell trait    Type 2 diabetes mellitus without complication, without long-term current use of insulin (HCC) 03/14/2023   Vitamin D  deficiency 05/2019   Past Surgical History:  Procedure Laterality Date   AXILLARY LYMPH NODE DISSECTION     CHOLECYSTECTOMY     Family History  Problem Relation Age of Onset   Hypertension Mother    Diabetes Mother    Liver disease Neg Hx    Esophageal cancer Neg Hx    Colon cancer Neg Hx    Breast cancer Neg Hx    Social History   Socioeconomic History   Marital status: Single    Spouse name: Not on file   Number of children: 3   Years of education: Not on file   Highest education level: Some college, no degree  Occupational History   Occupation: disable  Tobacco Use   Smoking status: Never   Smokeless tobacco: Never  Vaping Use   Vaping status: Never Used  Substance and Sexual Activity   Alcohol use: Not Currently     Alcohol/week: 1.0 standard drink of alcohol    Types: 1 Glasses of wine per week    Comment: occasional/holidays   Drug use: Yes    Types: Oxycodone    Sexual activity: Yes    Birth control/protection: Condom  Other Topics Concern   Not on file  Social History Narrative   Not on file   Social Drivers of Health   Financial Resource Strain: Low Risk  (11/06/2023)   Overall Financial Resource Strain (CARDIA)    Difficulty of Paying Living Expenses: Not hard at all  Food Insecurity: No Food Insecurity (11/06/2023)   Hunger Vital Sign    Worried About Running Out of Food in the Last Year: Never true    Ran Out of Food in the Last Year: Never true  Transportation Needs: No Transportation Needs (11/06/2023)   PRAPARE - Administrator, Civil Service (Medical): No    Lack of Transportation (Non-Medical): No  Physical Activity: Sufficiently Active (11/06/2023)   Exercise Vital Sign    Days of Exercise per Week: 5 days    Minutes of Exercise per Session: 30 min  Stress: No Stress Concern Present (11/06/2023)   [employer] of Occupational Health - Occupational Stress Questionnaire    Feeling of Stress: Only a little  Social Connections: Moderately Integrated (11/06/2023)   Social Connection and Isolation Panel    Frequency of Communication with Friends and Family: More than three times a week    Frequency of Social Gatherings with Friends and Family: Three times a week    Attends Religious Services: More than 4 times per year    Active Member of Clubs or Organizations: Yes    Attends Engineer, structural: More than 4 times per year    Marital Status: Never married    Tobacco Counseling Counseling given: Not Answered    Clinical Intake:  Pre-visit preparation completed: Yes  Pain : 0-10 Pain Score: 6  Pain Type: Chronic pain (stomach ache this morning) Pain Location: Back Pain Orientation: Lower Pain Descriptors / Indicators: Aching Pain Onset: More than  a month ago Pain Frequency: Constant     Nutritional Risks: None Diabetes: Yes CBG done?: No Did pt. bring in CBG monitor from home?: No  Lab Results  Component Value  Date   HGBA1C 6.8 (A) 10/31/2023   HGBA1C 7.0 (A) 07/30/2023   HGBA1C 7.5 (A) 04/25/2023     How often do you need to have someone help you when you read instructions, pamphlets, or other written materials from your doctor or pharmacy?: 1 - Never  Interpreter Needed?: No  Information entered by :: NAllen LPN   Activities of Daily Living     11/06/2023    8:32 AM  In your present state of health, do you have any difficulty performing the following activities:  Hearing? 0  Vision? 1  Comment wears reading glasses  Difficulty concentrating or making decisions? 1  Comment memory sometimes  Walking or climbing stairs? 0  Dressing or bathing? 0  Doing errands, shopping? 0  Preparing Food and eating ? N  Using the Toilet? N  In the past six months, have you accidently leaked urine? N  Do you have problems with loss of bowel control? N  Managing your Medications? N  Managing your Finances? N  Housekeeping or managing your Housekeeping? N    Patient Care Team: Oley Bascom RAMAN, NP as PCP - General (Pulmonary Disease)  I have updated your Care Teams any recent Medical Services you may have received from other providers in the past year.     Assessment:   This is a routine wellness examination for [place].  Hearing/Vision screen Hearing Screening - Comments:: Denies hearing issues Vision Screening - Comments:: Regular eye exams,    Goals Addressed             This Visit's Progress    Patient Stated       11/06/2023, trying not to get diabetes, eat healthier and get of BP medicine       Depression Screen     11/06/2023    8:44 AM 08/12/2023   11:21 AM 07/30/2023   10:18 AM 03/14/2023    8:19 AM 12/05/2022    9:16 AM 08/12/2022    8:56 AM 08/02/2021    1:17 PM  PHQ 2/9 Scores  PHQ - 2 Score 0 0  0 6 3 2  0  PHQ- 9 Score 1 0 3 21 11 9      Fall Risk     11/06/2023    8:44 AM 08/12/2023   11:21 AM 12/05/2022    9:16 AM 05/28/2021    4:13 PM 03/19/2021   10:13 AM  Fall Risk   Falls in the past year? 0 0 0 0 1  Number falls in past yr: 0   0 0  Injury with Fall? 0   0 1  Risk for fall due to : Medication side effect   No Fall Risks   Follow up Falls evaluation completed;Falls prevention discussed   Falls evaluation completed       Data saved with a previous flowsheet row definition    MEDICARE RISK AT HOME:  Medicare Risk at Home Any stairs in or around the home?: Yes If so, are there any without handrails?: No Home free of loose throw rugs in walkways, pet beds, electrical cords, etc?: Yes Adequate lighting in your home to reduce risk of falls?: Yes Life alert?: No Use of a cane, walker or w/c?: No Grab bars in the bathroom?: No Shower chair or bench in shower?: No Elevated toilet seat or a handicapped toilet?: Yes  TIMED UP AND GO:  Was the test performed?  No  Cognitive Function: 6CIT completed  11/06/2023    8:46 AM 08/02/2021    1:12 PM  6CIT Screen  What Year? 0 points 0 points  What month? 0 points 0 points  What time? 0 points 0 points  Count back from 20 0 points 2 points  Months in reverse 0 points 2 points  Repeat phrase 0 points 0 points  Total Score 0 points 4 points    Immunizations  There is no immunization history on file for this patient.  Screening Tests Health Maintenance  Topic Date Due   FOOT EXAM  Never done   OPHTHALMOLOGY EXAM  Never done   Hepatitis C Screening  Never done   Pneumococcal Vaccine: 50+ Years (1 of 2 - PCV) Never done   Hepatitis B Vaccines 19-59 Average Risk (1 of 3 - 19+ 3-dose series) Never done   Zoster Vaccines- Shingrix (1 of 2) Never done   Colonoscopy  Never done   Cervical Cancer Screening (HPV/Pap Cotest)  10/04/2018   Influenza Vaccine  Never done   Diabetic kidney evaluation - Urine ACR   10/30/2023   DTaP/Tdap/Td (1 - Tdap) 07/29/2024 (Originally 10/08/1986)   HEMOGLOBIN A1C  04/29/2024   Diabetic kidney evaluation - eGFR measurement  10/09/2024   Medicare Annual Wellness (AWV)  11/05/2024   Mammogram  10/21/2025   HIV Screening  Completed   HPV VACCINES  Aged Out   Meningococcal B Vaccine  Aged Out   COVID-19 Vaccine  Discontinued    Health Maintenance Items Addressed: Patient states she is prediabetic. States not due for colonoscopy.  Additional Screening:  Vision Screening: Recommended annual ophthalmology exams for early detection of glaucoma and other disorders of the eye. Is the patient up to date with their annual eye exam?  No  Who is the provider or what is the name of the office in which the patient attends annual eye exams? Can't remember name  Dental Screening: Recommended annual dental exams for proper oral hygiene  Community Resource Referral / Chronic Care Management: CRR required this visit?  No   CCM required this visit?  No   Plan:    I have personally reviewed and noted the following in the patient's chart:   Medical and social history Use of alcohol, tobacco or illicit drugs  Current medications and supplements including opioid prescriptions. Patient is currently taking opioid prescriptions. Information provided to patient regarding non-opioid alternatives. Patient advised to discuss non-opioid treatment plan with their provider. Functional ability and status Nutritional status Physical activity Advanced directives List of other physicians Hospitalizations, surgeries, and ER visits in previous 12 months Vitals Screenings to include cognitive, depression, and falls Referrals and appointments  In addition, I have reviewed and discussed with patient certain preventive protocols, quality metrics, and best practice recommendations. A written personalized care plan for preventive services as well as general preventive health recommendations  were provided to patient.   Ardella FORBES Dawn, LPN   [id number]   After Visit Summary: (MyChart) Due to this being a telephonic visit, the after visit summary with patients personalized plan was offered to patient via MyChart   Notes: Nothing significant to report at this time.

## 2023-11-06 NOTE — Patient Instructions (Signed)
 Crystal Bullock,  Thank you for taking the time for your Medicare Wellness Visit. I appreciate your continued commitment to your health goals. Please review the care plan we discussed, and feel free to reach out if I can assist you further.  Medicare recommends these wellness visits once per year to help you and your care team stay ahead of potential health issues. These visits are designed to focus on prevention, allowing your provider to concentrate on managing your acute and chronic conditions during your regular appointments.  Please note that Annual Wellness Visits do not include a physical exam. Some assessments may be limited, especially if the visit was conducted virtually. If needed, we may recommend a separate in-person follow-up with your provider.  Ongoing Care Seeing your primary care provider every 3 to 6 months helps us  monitor your health and provide consistent, personalized care.   Referrals If a referral was made during today's visit and you haven't received any updates within two weeks, please contact the referred provider directly to check on the status.  Recommended Screenings:  Health Maintenance  Topic Date Due   Complete foot exam   Never done   Eye exam for diabetics  Never done   Hepatitis C Screening  Never done   Pneumococcal Vaccine for age over 19 (1 of 2 - PCV) Never done   Hepatitis B Vaccine (1 of 3 - 19+ 3-dose series) Never done   Zoster (Shingles) Vaccine (1 of 2) Never done   Colon Cancer Screening  Never done   Pap with HPV screening  10/04/2018   Flu Shot  Never done   Yearly kidney health urinalysis for diabetes  10/30/2023   DTaP/Tdap/Td vaccine (1 - Tdap) 07/29/2024*   Hemoglobin A1C  04/29/2024   Yearly kidney function blood test for diabetes  10/09/2024   Medicare Annual Wellness Visit  11/05/2024   Breast Cancer Screening  10/21/2025   HIV Screening  Completed   HPV Vaccine  Aged Out   Meningitis B Vaccine  Aged Out   COVID-19 Vaccine   Discontinued  *Topic was postponed. The date shown is not the original due date.       11/06/2023    8:42 AM  Advanced Directives  Does Patient Have a Medical Advance Directive? No  Would patient like information on creating a medical advance directive? No - Patient declined   Advance Care Planning is important because it: Ensures you receive medical care that aligns with your values, goals, and preferences. Provides guidance to your family and loved ones, reducing the emotional burden of decision-making during critical moments.  Vision: Annual vision screenings are recommended for early detection of glaucoma, cataracts, and diabetic retinopathy. These exams can also reveal signs of chronic conditions such as diabetes and high blood pressure.  Dental: Annual dental screenings help detect early signs of oral cancer, gum disease, and other conditions linked to overall health, including heart disease and diabetes.  Please see the attached documents for additional preventive care recommendations.

## 2023-11-10 ENCOUNTER — Other Ambulatory Visit: Payer: Self-pay | Admitting: Nurse Practitioner

## 2023-11-10 DIAGNOSIS — K651 Peritoneal abscess: Secondary | ICD-10-CM

## 2023-11-10 MED ORDER — AMOXICILLIN-POT CLAVULANATE 875-125 MG PO TABS: 1.0000 | ORAL_TABLET | Freq: Two times a day (BID) | ORAL | 0 refills | Status: DC

## 2023-11-10 NOTE — Telephone Encounter (Signed)
 Please advise [facility]

## 2023-11-10 NOTE — Telephone Encounter (Signed)
 Copied from CRM [id number]. Topic: Clinical - Medication Refill >> Nov 10, 2023  1:31 PM Thliyah D wrote: Medication: amoxicillin -clavulanate (AUGMENTIN ) 875-125 MG tablet  Has the patient contacted their pharmacy? No (Agent: If no, request that the patient contact the pharmacy for the refill. If patient does not wish to contact the pharmacy document the reason why and proceed with request.) (Agent: If yes, when and what did the pharmacy advise?)  This is the patient's preferred pharmacy:  [id number] - [place], [place] - [address] AT [address] OF [facility] [address] [place] [address] Phone: [phone] Fax: [phone]  Is this the correct pharmacy for this prescription? Yes If no, delete pharmacy and type the correct one.   Has the prescription been filled recently? No  Is the patient out of the medication? Yes  Has the patient been seen for an appointment in the last year OR does the patient have an upcoming appointment? Yes  Can we respond through MyChart? No  Agent: Please be advised that Rx refills may take up to 3 business days. We ask that you follow-up with your pharmacy.

## 2023-11-17 ENCOUNTER — Other Ambulatory Visit: Payer: Self-pay

## 2023-11-17 ENCOUNTER — Telehealth: Payer: Self-pay | Admitting: Nurse Practitioner

## 2023-11-17 DIAGNOSIS — J45909 Unspecified asthma, uncomplicated: Secondary | ICD-10-CM

## 2023-11-17 NOTE — Telephone Encounter (Signed)
 Done [facility]

## 2023-11-17 NOTE — Telephone Encounter (Unsigned)
 Copied from CRM [id number]. Topic: Clinical - Order For Equipment >> Nov 17, 2023  7:54 AM Tiffini S wrote: Reason for CRM: Patient called stating that she needs a new breathing- called the number on the back of the machine- company is no longer in service. [employer] [phone] Did her last treatment two weeks ago. She is starting to wheeze and she cannot sleep.   Please call the patient at [phone]- needs to know if breathing treatment can be done in office and recommendation for a new machine.

## 2023-11-20 DIAGNOSIS — E119 Type 2 diabetes mellitus without complications: Secondary | ICD-10-CM | POA: Diagnosis not present

## 2023-11-20 DIAGNOSIS — M542 Cervicalgia: Secondary | ICD-10-CM | POA: Diagnosis not present

## 2023-11-20 DIAGNOSIS — Z79899 Other long term (current) drug therapy: Secondary | ICD-10-CM | POA: Diagnosis not present

## 2023-11-20 DIAGNOSIS — E790 Hyperuricemia without signs of inflammatory arthritis and tophaceous disease: Secondary | ICD-10-CM | POA: Diagnosis not present

## 2023-11-20 DIAGNOSIS — G894 Chronic pain syndrome: Secondary | ICD-10-CM | POA: Diagnosis not present

## 2023-11-20 DIAGNOSIS — M545 Low back pain, unspecified: Secondary | ICD-10-CM | POA: Diagnosis not present

## 2023-11-20 DIAGNOSIS — G4730 Sleep apnea, unspecified: Secondary | ICD-10-CM | POA: Diagnosis not present

## 2023-11-25 ENCOUNTER — Other Ambulatory Visit: Payer: Self-pay | Admitting: Nurse Practitioner

## 2023-11-25 ENCOUNTER — Ambulatory Visit: Payer: Self-pay

## 2023-11-25 NOTE — Telephone Encounter (Unsigned)
 Copied from CRM [id number]. Topic: Clinical - Medication Refill >> Nov 25, 2023 10:30 AM Fonda T wrote: Medication:triamcinolone  cream (KENALOG ) 0.1 % clonazePAM  (KLONOPIN ) 0.5 MG tablet    Has the patient contacted their pharmacy? Yes, advised to contact office   This is the patient's preferred pharmacy:  [id number] - [place], [place] - [address] AT [address] OF [facility] [address] [place] [address] Phone: [phone] Fax: [phone]  Is this the correct pharmacy for this prescription? Yes If no, delete pharmacy and type the correct one.   Has the prescription been filled recently? Yes  Is the patient out of the medication? Yes  Has the patient been seen for an appointment in the last year OR does the patient have an upcoming appointment? Yes  Can we respond through MyChart? Yes  Agent: Please be advised that Rx refills may take up to 3 business days. We ask that you follow-up with your pharmacy.

## 2023-11-25 NOTE — Telephone Encounter (Signed)
 FYI Only or Action Required?: FYI only for provider.  Patient was last seen in primary care on 10/31/2023 by Crystal Bascom RAMAN, Crystal Bullock.  Called Nurse Triage reporting Anxiety.  Symptoms began yesterday.  Interventions attempted: Nothing.  Symptoms are: stable.  Triage Disposition: Home Care  Patient/caregiver understands and will follow disposition?: Yes                             Copied from CRM [id number]. Topic: Clinical - Red Word Triage >> Nov 25, 2023 10:33 AM Crystal Bullock wrote: [facility] that prompted transfer to Nurse Triage: Patient calling, states she has increased anxiety and losing sleep due to being without medication. Reason for Disposition  MILD anxiety symptoms (e.g., anxiety symptoms are mild and intermittent; symptoms do not interfere with daily activities)  Answer Assessment - Initial Assessment Questions 1. CONCERN: Did anything happen that prompted you to call today?      States she typically takes a Klonopin  whenever she does a breathing treatment to help with jitteriness and anxiety, states she did not have access to Klonopin  last night and was up all night with anxiety 2. ANXIETY SYMPTOMS: Can you describe how you (your loved one; patient) have been feeling? (e.g., tense, restless, panicky, anxious, keyed up, overwhelmed, sense of impending doom).      Restless, panicky and overwhelmed 3. ONSET: How long have you been feeling this way? (e.g., hours, days, weeks)     Yesterday  4. SEVERITY: How would you rate the level of anxiety? (e.g., 0 - 10; or mild, moderate, severe).     Mild at this time 5. FUNCTIONAL IMPAIRMENT: How have these feelings affected your ability to do daily activities? Have you had more difficulty than usual doing your normal daily activities? (e.g., getting better, same, worse; self-care, school, work, interactions)     States she has been able to perform activities as normal this morning, but had to sleep in a  little bit later today 6. HISTORY: Have you felt this way before? Have you ever been diagnosed with an anxiety problem in the past? (e.g., generalized anxiety disorder, panic attacks, PTSD). If Yes, ask: How was this problem treated? (e.g., medicines, counseling, etc.)     Yes 7. RISK OF HARM - SUICIDAL IDEATION: Do you ever have thoughts of hurting or killing yourself? If Yes, ask:  Do you have these feelings now? Do you have a plan on how you would do this?     Denies 8. TREATMENT:  What has been done so far to treat this anxiety? (e.g., medicines, relaxation strategies). What has helped?     States Klonopin  is effective in managing symptoms 10. POTENTIAL TRIGGERS: Do you drink caffeinated beverages (e.g., coffee, colas, teas), and how much daily? Do you drink alcohol or use any drugs? Have you started any new medicines recently?     Declines- states she is home with her granddaughter right now and does not feel comfortable discussing 11. PATIENT SUPPORT: Who is with you now? Who do you live with? Do you have family or friends who you can talk to?      Yes  12. OTHER SYMPTOMS: Do you have any other symptoms? (e.g., feeling depressed, trouble concentrating, trouble sleeping, trouble breathing, palpitations or fast heartbeat, chest pain, sweating, nausea, or diarrhea)     Difficulty sleeping, jitteriness, denies chest pain, denies difficulty breathing    Patient was sent over to NT because she  experienced anxiety and difficulty sleeping last night. Patient stated she did a nebulizer treatment last night. Patient stated she typically takes a Klonopin  whenever she does a breathing treatment to help with the jitteriness. Patient stated she was out of Klonopin  and was not able to take one last night. Patient stated she was up all night with anxiety. Patient stated her symptoms are tapering off this morning. Patient is safe at this time. Patient stated Klonopin  is very  effective at managing symptoms. This RN advised home care at this time and advised that I would submit a refill request for Klonopin . Patient verbalized understanding and will monitor symptoms.  Protocols used: Anxiety and Panic Attack-A-AH

## 2023-11-25 NOTE — Telephone Encounter (Signed)
 FYI Only or Action Required?: FYI only for provider.   Patient was last seen in primary care on 10/31/2023 by Crystal Bascom RAMAN, NP.   Called Nurse Triage reporting Anxiety.   Symptoms began yesterday.   Interventions attempted: Nothing.   Symptoms are: stable.   Triage Disposition: Home Care   Patient/caregiver understands and will follow disposition?: Yes                                                         Copied from CRM [id number]. Topic: Clinical - Red Word Triage >> Nov 25, 2023 10:33 AM Crystal Bullock wrote: [facility] that prompted transfer to Nurse Triage: Patient calling, states she has increased anxiety and losing sleep due to being without medication. Reason for Disposition  MILD anxiety symptoms (e.g., anxiety symptoms are mild and intermittent; symptoms do not interfere with daily activities)  Answer Assessment - Initial Assessment Questions 1. CONCERN: Did anything happen that prompted you to call today?      States she typically takes a Klonopin  whenever she does a breathing treatment to help with jitteriness and anxiety, states she did not have access to Klonopin  last night and was up all night with anxiety 2. ANXIETY SYMPTOMS: Can you describe how you (your loved one; patient) have been feeling? (e.g., tense, restless, panicky, anxious, keyed up, overwhelmed, sense of impending doom).      Restless, panicky and overwhelmed 3. ONSET: How long have you been feeling this way? (e.g., hours, days, weeks)     Yesterday  4. SEVERITY: How would you rate the level of anxiety? (e.g., 0 - 10; or mild, moderate, severe).     Mild at this time 5. FUNCTIONAL IMPAIRMENT: How have these feelings affected your ability to do daily activities? Have you had more difficulty than usual doing your normal daily activities? (e.g., getting better, same, worse; self-care, school, work, interactions)     States she has been able to perform activities as normal this  morning, but had to sleep in a little bit later today 6. HISTORY: Have you felt this way before? Have you ever been diagnosed with an anxiety problem in the past? (e.g., generalized anxiety disorder, panic attacks, PTSD). If Yes, ask: How was this problem treated? (e.g., medicines, counseling, etc.)     Yes 7. RISK OF HARM - SUICIDAL IDEATION: Do you ever have thoughts of hurting or killing yourself? If Yes, ask:  Do you have these feelings now? Do you have a plan on how you would do this?     Denies 8. TREATMENT:  What has been done so far to treat this anxiety? (e.g., medicines, relaxation strategies). What has helped?     States Klonopin  is effective in managing symptoms 10. POTENTIAL TRIGGERS: Do you drink caffeinated beverages (e.g., coffee, colas, teas), and how much daily? Do you drink alcohol or use any drugs? Have you started any new medicines recently?     Declines- states she is home with her granddaughter right now and does not feel comfortable discussing 11. PATIENT SUPPORT: Who is with you now? Who do you live with? Do you have family or friends who you can talk to?      Yes  12. OTHER SYMPTOMS: Do you have any other symptoms? (e.g., feeling depressed, trouble concentrating, trouble sleeping, trouble  breathing, palpitations or fast heartbeat, chest pain, sweating, nausea, or diarrhea)     Difficulty sleeping, jitteriness, denies chest pain, denies difficulty breathing    Patient was sent over to NT because she experienced anxiety and difficulty sleeping last night. Patient stated she did a nebulizer treatment last night. Patient stated she typically takes a Klonopin  whenever she does a breathing treatment to help with the jitteriness. Patient stated she was out of Klonopin  and was not able to take one last night. Patient stated she was up all night with anxiety. Patient stated her symptoms are tapering off this morning. Patient is safe at this time. Patient  stated Klonopin  is very effective at managing symptoms. This RN advised home care at this time and advised that I would submit a refill request for Klonopin . Patient verbalized understanding and will monitor symptoms.

## 2023-11-25 NOTE — Telephone Encounter (Signed)
 Medication was requested during NT encounter. Please see NT encounter for additional information.

## 2023-11-26 MED ORDER — TRIAMCINOLONE ACETONIDE 0.1 % EX CREA: TOPICAL_CREAM | Freq: Two times a day (BID) | CUTANEOUS | 0 refills | Status: DC

## 2023-11-26 MED ORDER — CLONAZEPAM 0.5 MG PO TABS: 0.2500 mg | ORAL_TABLET | Freq: Two times a day (BID) | ORAL | 0 refills | Status: DC | PRN

## 2023-12-02 ENCOUNTER — Telehealth: Payer: Self-pay

## 2023-12-02 ENCOUNTER — Ambulatory Visit (INDEPENDENT_AMBULATORY_CARE_PROVIDER_SITE_OTHER): Admitting: Neurology

## 2023-12-02 ENCOUNTER — Encounter: Payer: Self-pay | Admitting: Neurology

## 2023-12-02 VITALS — BP 149/96 | HR 85 | Ht 67.0 in | Wt 245.0 lb

## 2023-12-02 DIAGNOSIS — R519 Headache, unspecified: Secondary | ICD-10-CM | POA: Diagnosis not present

## 2023-12-02 NOTE — Telephone Encounter (Signed)
 Patient walked in and dropped off forms.  Patient verbalized they need a rx for amoxicillin -clavulanate (AUGMENTIN ) 875-125 MG tablet [[id number]], benzonatate  (TESSALON ) 200 MG capsule [[id number]]

## 2023-12-02 NOTE — Patient Instructions (Addendum)
 MRI brain with and without contrast to rule out involvement of the meninges.  Will premedicate patient with Benadryl  and Tylenol  Continue follow-up PCP Please contact us  if headaches frequency do get worse

## 2023-12-02 NOTE — Progress Notes (Signed)
 [facility]  PATIENT: Crystal Bullock DOB: Jul 11, 1965  REQUESTING CLINICIAN: Oley Bascom RAMAN, NP HISTORY FROM: Patient  REASON FOR VISIT: Headaches    HISTORICAL  CHIEF COMPLAINT:  Chief Complaint  Patient presents with   RM 13     Patient is here alone for headaches     HISTORY OF PRESENT ILLNESS:  Discussed the use of AI scribe software for clinical note transcription with the patient, who gave verbal consent to proceed.  Crystal Bullock is a 58 year old female with medical history including sarcoidosis, diabetes, anxiety, depression,  who presents with complaints of headaches.  She has been experiencing headaches for the past couple of months. Initially, the headaches were constant for about a week, starting on the left side and moving to the right side, characterized by sharp, stabbing pain that occurred multiple times a day. Currently, the headaches are less frequent, occurring sporadically and lasting less than five seconds, described as a sensation of 'somebody might be taking a needle or a pen and just sticking' in her temple.  Her last episode was more than a couple weeks ago. No associated symptoms such as blurred vision, tearing of the eyes, or runny nose. She has not taken the prescribed indomethacin  due to concerns about side effects and her current medication load, occasionally using ibuprofen  for pain relief.  Her past medical history includes sarcoidosis affecting her lungs. She is concerned about the potential for sarcoidosis to affect her brain. No symptoms like Bell's palsy have been experienced.  She is allergic to iodine contrast, which previously caused hives and peeling skin.     OTHER MEDICAL CONDITIONS: Sarcoidosis, diabetes, hypertension, hyperlipidemia, anxiety/depression   REVIEW OF SYSTEMS: Full 14 system review of systems performed and negative with exception of: As noted in the HPI  ALLERGIES: Allergies   Allergen Reactions   Bee Venom Anaphylaxis   Iodinated Contrast Media Rash    Hives on hands and feet per pt 08/16/15 pt had scan w/ contrast and had a 4 hr premedication protocol done    HOME MEDICATIONS: Outpatient Medications Prior to Visit  Medication Sig Dispense Refill   Accu-Chek Softclix Lancets lancets by Other route 3 (three) times daily.     albuterol  (PROVENTIL ) (2.5 MG/3ML) 0.083% nebulizer solution USE 1 VIAL VIA NEBULIZER EVERY 6 HOURS AS NEEDED FOR WHEEZING OR SHORTNESS OF BREATH 75 mL 1   albuterol  (VENTOLIN  HFA) 108 (90 Base) MCG/ACT inhaler INHALE 2 PUFFS INTO THE LUNGS EVERY 6 HOURS FOR UP TO 90 DOSES AS NEEDED FOR WHEEZING 6.7 each 1   amLODipine  (NORVASC ) 10 MG tablet TAKE 1 TABLET(10 MG) BY MOUTH DAILY 90 tablet 0   amoxicillin -clavulanate (AUGMENTIN ) 875-125 MG tablet Take 1 tablet by mouth 2 (two) times daily. 20 tablet 0   amoxicillin -clavulanate (AUGMENTIN ) 875-125 MG tablet Take 1 tablet by mouth 2 (two) times daily. 20 tablet 0   ascorbic acid  (VITAMIN C ) 500 MG tablet Take 1 tablet (500 mg total) by mouth daily. 100 tablet 0   baclofen (LIORESAL) 10 MG tablet Take 10 mg by mouth 2 (two) times daily as needed for muscle spasms.     benzonatate  (TESSALON ) 200 MG capsule Take 1 capsule (200 mg total) by mouth 3 (three) times daily as needed for cough. 20 capsule 0   Blood Glucose Monitoring Suppl (ACCU-CHEK GUIDE ME) w/Device KIT 1 EACH BY OTHER ROUTE 3 (THREE) TIMES DAILY. 1 kit EACH   Blood Glucose Monitoring Suppl DEVI 1 each  by Does not apply route 2 (two) times daily. May substitute to any manufacturer covered by patient's insurance. 1 each 0   cetirizine  (ZYRTEC ) 10 MG tablet TAKE 1 TABLET BY MOUTH DAILY AS NEEDED FOR ALLERGIES 30 tablet 2   clindamycin  (CLINDAGEL ) 1 % gel Apply topically 2 (two) times daily. 30 g 0   clonazePAM  (KLONOPIN ) 0.5 MG tablet Take 0.5 tablets (0.25 mg total) by mouth 2 (two) times daily as needed for anxiety. 10 tablet 0   docusate  sodium (COLACE) 100 MG capsule Take 1 capsule (100 mg total) by mouth 2 (two) times daily. 10 capsule 0   docusate sodium  (COLACE) 100 MG capsule Take 1 capsule (100 mg total) by mouth 2 (two) times daily. 60 capsule 3   DULoxetine  (CYMBALTA ) 30 MG capsule TAKE 1 CAPSULE BY MOUTH EVERY DAY 90 capsule 0   fluticasone  (FLONASE ) 50 MCG/ACT nasal spray PLACE 2 SPRAYS INTO BOTH NOSTRILS DAILY AS NEEDED. ALLERGIES (Patient taking differently: Place 2 sprays into both nostrils daily. allergies) 48 mL 3   gabapentin (NEURONTIN) 300 MG capsule Take 300 mg by mouth as needed.     Glucose Blood (BLOOD GLUCOSE TEST STRIPS) STRP 1 each by In Vitro route 2 (two) times daily. May substitute to any manufacturer covered by patient's insurance. 100 strip 0   hydrOXYzine  (VISTARIL ) 50 MG capsule TAKE 1 CAPSULE (50 MG TOTAL) BY MOUTH EVERY 6 (SIX) HOURS AS NEEDED. TAKE AS PRESCRIBED AS AVS. 360 capsule 0   Lancet Device MISC 1 each by Does not apply route 2 (two) times daily. May substitute to any manufacturer covered by patient's insurance. 1 each 0   Lancets Misc. MISC 1 each by Does not apply route 2 (two) times daily. May substitute to any manufacturer covered by patient's insurance. 100 each 0   LINZESS  145 MCG CAPS capsule      meloxicam (MOBIC) 15 MG tablet Take 15 mg by mouth daily.     methocarbamol  (ROBAXIN ) 500 MG tablet Take 1 tablet (500 mg total) by mouth every 6 (six) hours as needed for muscle spasms. 20 tablet 0   Multiple Vitamins-Minerals (MULTIVITAMINS THER. W/MINERALS) TABS tablet Take 1 tablet by mouth daily. 30 tablet 0   NON FORMULARY CPAP at bedtime     nystatin  ointment (MYCOSTATIN ) Apply 1 Application topically 2 (two) times daily. 30 g 1   ondansetron  (ZOFRAN ) 4 MG tablet Take 1 tablet (4 mg total) by mouth every 8 (eight) hours as needed for nausea or vomiting. 20 tablet 0   oxyCODONE -acetaminophen  (PERCOCET) 10-325 MG tablet Take 1 tablet by mouth every 8 (eight) hours as needed for pain.      promethazine  (PHENERGAN ) 12.5 MG tablet Take 12.5 mg by mouth every 8 (eight) hours as needed.     triamcinolone  cream (KENALOG ) 0.1 % Apply topically 2 (two) times daily. 30 g 0   Vitamin D , Ergocalciferol , (DRISDOL ) 1.25 MG (50000 UNIT) CAPS capsule Take 1 capsule (50,000 Units total) by mouth every 7 (seven) days. 5 capsule 6   esomeprazole  (NEXIUM ) 40 MG capsule Take 1 capsule (40 mg total) by mouth daily. 90 capsule 0   hydrochlorothiazide  (MICROZIDE ) 12.5 MG capsule Take 1 capsule (12.5 mg total) by mouth daily. (Patient not taking: Reported on 12/02/2023) 90 capsule 2   methylPREDNISolone  (MEDROL  DOSEPAK) 4 MG TBPK tablet Please take as instructed on the packaging (Patient not taking: Reported on 12/02/2023) 1 each 0   Potassium Bicarbonate 99 MG CAPS Take 99 mg by  mouth daily. (Patient not taking: Reported on 12/02/2023)     potassium chloride  SA (KLOR-CON  M) 20 MEQ tablet Take 1 tablet (20 mEq total) by mouth 2 (two) times daily. (Patient not taking: Reported on 12/02/2023) 5 tablet 0   scopolamine  (TRANSDERM-SCOP) 1 MG/3DAYS Place 1 patch (1.5 mg total) onto the skin every 3 (three) days. (Patient not taking: Reported on 12/02/2023) 10 patch 12   Semaglutide  (RYBELSUS ) 3 MG TABS Take 1 tablet (3 mg total) by mouth daily. (Patient not taking: Reported on 12/02/2023) 30 tablet 0   No facility-administered medications prior to visit.    PAST MEDICAL HISTORY: Past Medical History:  Diagnosis Date   Anxiety 10/06/2015   Asthma    Atypical chest pain 10/06/2015   Chronic neck pain    Fibroid    Hypertension    Palpitations 10/06/2015   RBBB 10/06/2015   Sarcoidosis    Scoliosis    Seasonal allergies    Sickle cell trait    Type 2 diabetes mellitus without complication, without long-term current use of insulin (HCC) 03/14/2023   Vitamin D  deficiency 05/2019    PAST SURGICAL HISTORY: Past Surgical History:  Procedure Laterality Date   AXILLARY LYMPH NODE DISSECTION      CHOLECYSTECTOMY      FAMILY HISTORY: Family History  Problem Relation Age of Onset   Hypertension Mother    Diabetes Mother    Liver disease Neg Hx    Esophageal cancer Neg Hx    Colon cancer Neg Hx    Breast cancer Neg Hx    Migraines Neg Hx     SOCIAL HISTORY: Social History   Socioeconomic History   Marital status: Single    Spouse name: Not on file   Number of children: 3   Years of education: Not on file   Highest education level: Some college, no degree  Occupational History   Occupation: disable  Tobacco Use   Smoking status: Never   Smokeless tobacco: Never  Vaping Use   Vaping status: Never Used  Substance and Sexual Activity   Alcohol use: Not Currently    Alcohol/week: 1.0 standard drink of alcohol    Types: 1 Glasses of wine per week    Comment: occasional/holidays   Drug use: Yes    Types: Oxycodone    Sexual activity: Yes    Birth control/protection: Condom  Other Topics Concern   Not on file  Social History Narrative   2 cups of caffeine daily    Social Drivers of Health   Financial Resource Strain: Low Risk  (11/06/2023)   Overall Financial Resource Strain (CARDIA)    Difficulty of Paying Living Expenses: Not hard at all  Food Insecurity: No Food Insecurity (11/06/2023)   Hunger Vital Sign    Worried About Running Out of Food in the Last Year: Never true    Ran Out of Food in the Last Year: Never true  Transportation Needs: No Transportation Needs (11/06/2023)   PRAPARE - Administrator, Civil Service (Medical): No    Lack of Transportation (Non-Medical): No  Physical Activity: Sufficiently Active (11/06/2023)   Exercise Vital Sign    Days of Exercise per Week: 5 days    Minutes of Exercise per Session: 30 min  Stress: No Stress Concern Present (11/06/2023)   [employer] of Occupational Health - Occupational Stress Questionnaire    Feeling of Stress: Only a little  Social Connections: Moderately Integrated (11/06/2023)    Social Connection and  Isolation Panel    Frequency of Communication with Friends and Family: More than three times a week    Frequency of Social Gatherings with Friends and Family: Three times a week    Attends Religious Services: More than 4 times per year    Active Member of Clubs or Organizations: Yes    Attends Banker Meetings: More than 4 times per year    Marital Status: Never married  Intimate Partner Violence: Not At Risk (11/06/2023)   Humiliation, Afraid, Rape, and Kick questionnaire    Fear of Current or Ex-Partner: No    Emotionally Abused: No    Physically Abused: No    Sexually Abused: No    PHYSICAL EXAM  GENERAL EXAM/CONSTITUTIONAL: Vitals:  Vitals:   12/02/23 0814  BP: (!) 149/96  Pulse: 85  Weight: 245 lb (111.1 kg)  Height: 5' 7 (1.702 m)   Body mass index is 38.37 kg/m. Wt Readings from Last 3 Encounters:  12/02/23 245 lb (111.1 kg)  10/31/23 244 lb 9.6 oz (110.9 kg)  08/12/23 250 lb (113.4 kg)   Patient is in no distress; well developed, nourished and groomed; neck is supple  MUSCULOSKELETAL: Gait, strength, tone, movements noted in Neurologic exam below  NEUROLOGIC: MENTAL STATUS:      No data to display         awake, alert, oriented to person, place and time recent and remote memory intact normal attention and concentration language fluent, comprehension intact, naming intact fund of knowledge appropriate  CRANIAL NERVE:  2nd, 3rd, 4th, 6th - pupils equal and reactive to light, visual fields full to confrontation, extraocular muscles intact, no nystagmus 5th - facial sensation symmetric 7th - facial strength symmetric 8th - hearing intact 9th - palate elevates symmetrically, uvula midline 11th - shoulder shrug symmetric 12th - tongue protrusion midline  MOTOR:  normal bulk and tone, full strength in the BUE, BLE  SENSORY:  normal and symmetric to light touch  COORDINATION:  finger-nose-finger, fine finger  movements normal  GAIT/STATION:  normal   DIAGNOSTIC DATA (LABS, IMAGING, TESTING) - I reviewed patient records, labs, notes, testing and imaging myself where available.  Lab Results  Component Value Date   WBC 10.8 (H) 10/10/2023   HGB 11.5 (L) 10/10/2023   HCT 33.2 (L) 10/10/2023   MCV 71.6 (L) 10/10/2023   PLT 295 10/10/2023      Component Value Date/Time   NA 138 10/10/2023 1522   NA 141 03/14/2023 0938   K 3.6 10/10/2023 1522   CL 103 10/10/2023 1522   CO2 23 10/10/2023 1522   GLUCOSE 130 (H) 10/10/2023 1522   BUN 15 10/10/2023 1522   BUN 10 03/14/2023 0938   CREATININE 1.09 (H) 10/10/2023 1522   CALCIUM 9.9 10/10/2023 1522   PROT 7.5 10/10/2023 1522   PROT 7.2 03/14/2023 0938   ALBUMIN 4.0 10/10/2023 1522   ALBUMIN 4.0 03/14/2023 0938   AST 21 10/10/2023 1522   ALT 15 10/10/2023 1522   ALKPHOS 57 10/10/2023 1522   BILITOT 0.3 10/10/2023 1522   BILITOT 0.3 03/14/2023 0938   GFRNONAA 59 (L) 10/10/2023 1522   GFRAA >60 09/10/2019 1534   Lab Results  Component Value Date   CHOL 148 08/12/2022   HDL 37 (L) 08/12/2022   LDLCALC 75 08/12/2022   TRIG 214 (H) 08/12/2022   CHOLHDL 4.0 08/12/2022   Lab Results  Component Value Date   HGBA1C 6.8 (A) 10/31/2023   Lab Results  Component  Value Date   VITAMINB12 662 06/08/2019   Lab Results  Component Value Date   TSH 0.866 06/08/2019      ASSESSMENT AND PLAN  58 y.o. year old female with medical history including sarcoidosis, hypertension, hyperlipidemia, diabetes mellitus, anxiety/depression who is present with headaches, currently improving.   Headache Reports headaches beginning a couple of months ago, initially as recurrent sharp pain lasting about a week, with episodes multiple times a day. Currently, headaches are sharp, stabbing pains lasting less than five seconds, occurring infrequently, last episode a couple weeks ago. Concern exists about potential sarcoidosis involvement of the meninges or  cranial nerves. Differential diagnosis includes other headache types such as the autonomic cephalgia. Current presentation does not warrant daily medication due to infrequency and short duration of episodes. - Order MRI of the brain with contrast to evaluate for potential neurological involvement of sarcoidosis. - Advise to report if headaches become more frequent or severe, lasting multiple days. - Discuss the use of indomethacin  for episodes lasting multiple days, as it is stronger than ibuprofen .  Allergy to iodinated contrast media Documented allergy to iodinated contrast media, resulting in hives and skin peeling. Allergy specific to CT contrast; MRI contrast is different, but premedication is advised to prevent any allergic reaction. - Premedicate with Benadryl , Tylenol , prior to MRI with contrast.      1. Nonintractable episodic headache, unspecified headache type     Patient Instructions  MRI brain with and without contrast to rule out involvement of the meninges.  Will premedicate patient with Benadryl  and Tylenol  Continue follow-up PCP Please contact us  if headaches frequency do get worse    Orders Placed This Encounter  Procedures   MR BRAIN W WO CONTRAST    No orders of the defined types were placed in this encounter.   Return if symptoms worsen or fail to improve.    Pastor Falling, MD 12/02/2023, 9:28 AM  [facility] [address] [place], [place] [address] [phone]

## 2023-12-03 ENCOUNTER — Other Ambulatory Visit: Payer: Self-pay | Admitting: Nurse Practitioner

## 2023-12-03 ENCOUNTER — Telehealth: Payer: Self-pay | Admitting: Neurology

## 2023-12-03 DIAGNOSIS — R519 Headache, unspecified: Secondary | ICD-10-CM

## 2023-12-03 DIAGNOSIS — J309 Allergic rhinitis, unspecified: Secondary | ICD-10-CM

## 2023-12-03 DIAGNOSIS — K219 Gastro-esophageal reflux disease without esophagitis: Secondary | ICD-10-CM

## 2023-12-03 DIAGNOSIS — J4520 Mild intermittent asthma, uncomplicated: Secondary | ICD-10-CM

## 2023-12-03 DIAGNOSIS — J209 Acute bronchitis, unspecified: Secondary | ICD-10-CM

## 2023-12-03 DIAGNOSIS — D869 Sarcoidosis, unspecified: Secondary | ICD-10-CM

## 2023-12-03 NOTE — Telephone Encounter (Signed)
 no auth required sent to [facility] [phone]

## 2023-12-04 ENCOUNTER — Other Ambulatory Visit: Payer: Self-pay | Admitting: Nurse Practitioner

## 2023-12-04 ENCOUNTER — Telehealth: Payer: Self-pay

## 2023-12-04 DIAGNOSIS — J309 Allergic rhinitis, unspecified: Secondary | ICD-10-CM

## 2023-12-04 NOTE — Telephone Encounter (Signed)
 Copied from CRM [id number]. Topic: General - Call Back - No Documentation >> Dec 04, 2023 10:41 AM Tinnie BROCKS wrote: Reason for CRM: Pt returning call from [place]. She asked why she was taking antibiotic amoxicillin -clavulanate (AUGMENTIN ) 875-125 MG tablet. Per pt, she takes it because she has a boil. If

## 2023-12-05 ENCOUNTER — Other Ambulatory Visit: Payer: Self-pay | Admitting: Nurse Practitioner

## 2023-12-05 DIAGNOSIS — I10 Essential (primary) hypertension: Secondary | ICD-10-CM

## 2023-12-05 MED ORDER — AMOXICILLIN-POT CLAVULANATE 875-125 MG PO TABS: 1.0000 | ORAL_TABLET | Freq: Two times a day (BID) | ORAL | 0 refills | Status: AC

## 2023-12-09 ENCOUNTER — Other Ambulatory Visit: Payer: Self-pay | Admitting: Neurology

## 2023-12-09 ENCOUNTER — Telehealth: Payer: Self-pay | Admitting: Nurse Practitioner

## 2023-12-09 MED ORDER — ALPRAZOLAM 1 MG PO TABS: 1.0000 mg | ORAL_TABLET | ORAL | 0 refills | Status: AC | PRN

## 2023-12-09 MED ORDER — PREDNISONE 50 MG PO TABS: ORAL_TABLET | ORAL | 0 refills | Status: DC

## 2023-12-09 MED ORDER — DIPHENHYDRAMINE HCL 50 MG PO CAPS: ORAL_CAPSULE | ORAL | 0 refills | Status: AC

## 2023-12-09 NOTE — Telephone Encounter (Signed)
 Copied from CRM [id number]. Topic: General - Other >> Dec 09, 2023  4:37 PM Sophia H wrote: Reason for CRM: Patient is checking in on forms that were dropped off 10/21 - wanting to know when she will be able to pick up. No notes in chart indicating they are ready. Please advise # [phone]

## 2023-12-09 NOTE — Telephone Encounter (Signed)
 She is scheduled for a MRI on 11/5 at [facility] and left a voice mail on my phone that she is allergic to contrast and will need medication to take before the MRI for that and claustrophobia.

## 2023-12-09 NOTE — Telephone Encounter (Signed)
 Dr. Gregg- can you please review? Thank you

## 2023-12-09 NOTE — Telephone Encounter (Signed)
 Steroid, Benadryl  and Xanax sent to pharmacy with specific instructions. Thanks

## 2023-12-15 ENCOUNTER — Emergency Department (HOSPITAL_BASED_OUTPATIENT_CLINIC_OR_DEPARTMENT_OTHER)
Admission: EM | Admit: 2023-12-15 | Discharge: 2023-12-15 | Disposition: A | Discharge status: Home / Self Care | Attending: Emergency Medicine | Admitting: Emergency Medicine

## 2023-12-15 ENCOUNTER — Other Ambulatory Visit: Payer: Self-pay

## 2023-12-15 ENCOUNTER — Emergency Department (HOSPITAL_BASED_OUTPATIENT_CLINIC_OR_DEPARTMENT_OTHER)

## 2023-12-15 ENCOUNTER — Emergency Department (HOSPITAL_BASED_OUTPATIENT_CLINIC_OR_DEPARTMENT_OTHER): Admitting: Radiology

## 2023-12-15 ENCOUNTER — Encounter (HOSPITAL_BASED_OUTPATIENT_CLINIC_OR_DEPARTMENT_OTHER): Payer: Self-pay

## 2023-12-15 DIAGNOSIS — W1830XA Fall on same level, unspecified, initial encounter: Secondary | ICD-10-CM | POA: Insufficient documentation

## 2023-12-15 DIAGNOSIS — M542 Cervicalgia: Secondary | ICD-10-CM | POA: Diagnosis present

## 2023-12-15 DIAGNOSIS — S161XXA Strain of muscle, fascia and tendon at neck level, initial encounter: Secondary | ICD-10-CM | POA: Insufficient documentation

## 2023-12-15 DIAGNOSIS — S0093XA Contusion of unspecified part of head, initial encounter: Secondary | ICD-10-CM | POA: Insufficient documentation

## 2023-12-15 MED ORDER — OXYCODONE-ACETAMINOPHEN 5-325 MG PO TABS
1.0000 | ORAL_TABLET | ORAL | Status: DC | PRN
  Administered 2023-12-15: 1 via ORAL
  Filled 2023-12-15: qty 1

## 2023-12-15 NOTE — ED Notes (Signed)
Patient verbalizes understanding of discharge instructions. Opportunity for questioning and answers were provided. Armband removed by staff, pt discharged from ED. Ambulated out to lobby  

## 2023-12-15 NOTE — ED Triage Notes (Signed)
 Patient reports that her and her family were in an elevator when it dropped suddenly and she was slammed into the fall where she hit her head. She now has a stiff neck, back pain, shoulder pain, and head pain.

## 2023-12-16 ENCOUNTER — Ambulatory Visit (HOSPITAL_COMMUNITY): Admission: RE | Admit: 2023-12-16 | Source: Ambulatory Visit

## 2023-12-16 NOTE — ED Provider Notes (Signed)
  EMERGENCY DEPARTMENT AT [facility] Provider Note   CSN: [id number] Arrival date & time: 12/15/23  1636     Patient presents with: Torticollis, Back Pain, and Head Injury   Crystal Bullock is a 58 y.o. female.   Patient reports she was in an elevator at the fell.  Patient states when the elevator dropped.  She fell and struck the wall with her shoulder.  Patient states she fell to the ground she is unsure if she hit her head on the wall on the ground.  Patient states that she has a headache and pain in her neck.  Patient has pain in her shoulder.  Patient does not think that she lost consciousness.  Patient reports the pain has been increasing since the time of the injury.  Patient denies any nausea or vomiting.  Patient denies any chest pain she is not having any abdominal pain.  Patient has been able to stand and walk.  Patient is here with her grandchildren who are also in the elevator.  The history is provided by the patient. No language interpreter was used.  Back Pain Associated symptoms: headaches   Head Injury Associated symptoms: headache and neck pain        Prior to Admission medications   Medication Sig Start Date End Date Taking? Authorizing Provider  Accu-Chek Softclix Lancets lancets by Other route 3 (three) times daily. 05/25/23   [provider]  albuterol  (PROVENTIL ) (2.5 MG/3ML) 0.083% nebulizer solution USE 1 VIAL VIA NEBULIZER EVERY 6 HOURS AS NEEDED FOR WHEEZING OR SHORTNESS OF BREATH 12/03/23   Oley Bascom RAMAN, NP  albuterol  (VENTOLIN  HFA) 108 (90 Base) MCG/ACT inhaler INHALE 2 PUFFS INTO THE LUNGS EVERY 6 HOURS FOR UP TO 90 DOSES AS NEEDED FOR WHEEZING 09/10/23   Nichols, Tonya S, NP  ALPRAZolam (XANAX) 1 MG tablet Take 1 tablet (1 mg total) by mouth as needed (prior to MRI). Can take a second dose if needed. You must have a driver. 12/09/23   Gregg Lek, MD  amLODipine  (NORVASC ) 10 MG tablet TAKE 1 TABLET BY MOUTH EVERY DAY  12/05/23   Nichols, Tonya S, NP  amoxicillin -clavulanate (AUGMENTIN ) 875-125 MG tablet Take 1 tablet by mouth 2 (two) times daily. 11/10/23   Oley Bascom RAMAN, NP  amoxicillin -clavulanate (AUGMENTIN ) 875-125 MG tablet Take 1 tablet by mouth 2 (two) times daily. 12/05/23   Oley Bascom RAMAN, NP  ascorbic acid  (VITAMIN C ) 500 MG tablet Take 1 tablet (500 mg total) by mouth daily. 01/29/23   Nichols, Tonya S, NP  baclofen (LIORESAL) 10 MG tablet Take 10 mg by mouth 2 (two) times daily as needed for muscle spasms. 03/02/21   [provider]  benzonatate  (TESSALON ) 200 MG capsule TAKE 1 CAPSULE (200 MG TOTAL) BY MOUTH 3 (THREE) TIMES DAILY AS NEEDED FOR COUGH. 12/03/23   Nichols, Tonya S, NP  Blood Glucose Monitoring Suppl (ACCU-CHEK GUIDE ME) w/Device KIT 1 EACH BY OTHER ROUTE 3 (THREE) TIMES DAILY. 09/08/23   Oley Bascom RAMAN, NP  Blood Glucose Monitoring Suppl DEVI 1 each by Does not apply route 2 (two) times daily. May substitute to any manufacturer covered by patient's insurance. 04/09/23   Oley Bascom RAMAN, NP  cetirizine  (ZYRTEC ) 10 MG tablet TAKE 1 TABLET BY MOUTH DAILY AS NEEDED FOR ALLERGIES. 12/03/23   Oley Bascom RAMAN, NP  clindamycin  (CLINDAGEL ) 1 % gel Apply topically 2 (two) times daily. 11/23/21   Oley Bascom RAMAN, NP  clonazePAM  (KLONOPIN ) 0.5 MG  tablet Take 0.5 tablets (0.25 mg total) by mouth 2 (two) times daily as needed for anxiety. 11/26/23   Oley Bascom RAMAN, NP  diphenhydrAMINE  (BENADRYL ) 50 MG capsule Take 1 Tablet 1 hour prior to contrast administration 12/09/23   Camara, Amadou, MD  docusate sodium  (COLACE) 100 MG capsule Take 1 capsule (100 mg total) by mouth 2 (two) times daily. 08/20/23   Oley Bascom RAMAN, NP  docusate sodium  (COLACE) 100 MG capsule Take 1 capsule (100 mg total) by mouth 2 (two) times daily. 10/31/23   Oley Bascom RAMAN, NP  DULoxetine  (CYMBALTA ) 30 MG capsule TAKE 1 CAPSULE BY MOUTH EVERY DAY 09/11/23   Urbano Albright, MD  esomeprazole  (NEXIUM ) 40 MG  capsule TAKE 1 CAPSULE (40 MG TOTAL) BY MOUTH DAILY. 12/03/23 03/02/24  Nichols, Tonya S, NP  fluticasone  (FLONASE ) 50 MCG/ACT nasal spray PLACE 2 SPRAYS INTO BOTH NOSTRILS DAILY AS NEEDED. ALLERGIES 12/04/23   Nichols, Tonya S, NP  gabapentin (NEURONTIN) 300 MG capsule Take 300 mg by mouth as needed. 10/28/22   [provider]  Glucose Blood (BLOOD GLUCOSE TEST STRIPS) STRP 1 each by In Vitro route 2 (two) times daily. May substitute to any manufacturer covered by patient's insurance. 04/09/23   Oley Bascom RAMAN, NP  hydrochlorothiazide  (MICROZIDE ) 12.5 MG capsule Take 1 capsule (12.5 mg total) by mouth daily. Patient not taking: Reported on 12/02/2023 04/25/23   Oley Bascom RAMAN, NP  hydrOXYzine  (VISTARIL ) 50 MG capsule TAKE 1 CAPSULE (50 MG TOTAL) BY MOUTH EVERY 6 (SIX) HOURS AS NEEDED. TAKE AS PRESCRIBED AS AVS. 11/08/23   Urbano Albright, MD  Lancet Device MISC 1 each by Does not apply route 2 (two) times daily. May substitute to any manufacturer covered by patient's insurance. 04/09/23   Oley Bascom RAMAN, NP  Lancets Misc. MISC 1 each by Does not apply route 2 (two) times daily. May substitute to any manufacturer covered by patient's insurance. 04/09/23   Oley Bascom RAMAN, NP  LINZESS  145 MCG CAPS capsule  12/25/22   [provider]  meloxicam (MOBIC) 15 MG tablet Take 15 mg by mouth daily. 04/26/22   [provider]  methocarbamol  (ROBAXIN ) 500 MG tablet Take 1 tablet (500 mg total) by mouth every 6 (six) hours as needed for muscle spasms. 09/13/21   Armenta Canning, MD  methylPREDNISolone  (MEDROL  DOSEPAK) 4 MG TBPK tablet Please take as instructed on the packaging Patient not taking: Reported on 12/02/2023 01/27/23   Oley Bascom RAMAN, NP  Multiple Vitamins-Minerals (MULTIVITAMINS THER. W/MINERALS) TABS tablet Take 1 tablet by mouth daily. 04/17/23   Oley Bascom RAMAN, NP  NON FORMULARY CPAP at bedtime    [provider]  nystatin  ointment (MYCOSTATIN ) Apply 1  Application topically 2 (two) times daily. 08/20/23   Oley Bascom RAMAN, NP  ondansetron  (ZOFRAN ) 4 MG tablet Take 1 tablet (4 mg total) by mouth every 8 (eight) hours as needed for nausea or vomiting. 01/29/23   Nichols, Tonya S, NP  oxyCODONE -acetaminophen  (PERCOCET) 10-325 MG tablet Take 1 tablet by mouth every 8 (eight) hours as needed for pain.    [provider]  Potassium Bicarbonate 99 MG CAPS Take 99 mg by mouth daily. Patient not taking: Reported on 12/02/2023    [provider]  potassium chloride  SA (KLOR-CON  M) 20 MEQ tablet Take 1 tablet (20 mEq total) by mouth 2 (two) times daily. Patient not taking: Reported on 12/02/2023 03/12/23   Oley Bascom RAMAN, NP  predniSONE  (DELTASONE ) 50 MG tablet Take one  tablet at 13 hours, 7 hours, and 1 hour before contrast administration 12/09/23   Camara, Amadou, MD  promethazine  (PHENERGAN ) 12.5 MG tablet Take 12.5 mg by mouth every 8 (eight) hours as needed. 04/11/23   [provider]  scopolamine  (TRANSDERM-SCOP) 1 MG/3DAYS Place 1 patch (1.5 mg total) onto the skin every 3 (three) days. Patient not taking: Reported on 12/02/2023 09/03/23   Oley Bascom RAMAN, NP  Semaglutide  (RYBELSUS ) 3 MG TABS Take 1 tablet (3 mg total) by mouth daily. Patient not taking: Reported on 12/02/2023 04/09/23   Oley Bascom RAMAN, NP  triamcinolone  cream (KENALOG ) 0.1 % Apply topically 2 (two) times daily. 11/26/23   Oley Bascom RAMAN, NP  Vitamin D , Ergocalciferol , (DRISDOL ) 1.25 MG (50000 UNIT) CAPS capsule Take 1 capsule (50,000 Units total) by mouth every 7 (seven) days. 08/28/23   Oley Bascom RAMAN, NP    Allergies: Bee venom and Iodinated contrast media    Review of Systems  Musculoskeletal:  Positive for arthralgias and neck pain.  Neurological:  Positive for headaches.  All other systems reviewed and are negative.   Updated Vital Signs BP (!) 158/85 (BP Location: Right Arm)   Pulse 84   Temp 98.5 F (36.9 C)   Resp 16   LMP 08/24/2015  Comment: irregular periods per pt 09/12/15  SpO2 92%   Physical Exam Vitals and nursing note reviewed.  Constitutional:      Appearance: She is well-developed.  HENT:     Head: Normocephalic.     Right Ear: External ear normal.     Left Ear: External ear normal.     Nose: Nose normal.     Mouth/Throat:     Mouth: Mucous membranes are moist.  Neck:     Comments: Diffusely tender cervical spine. Cardiovascular:     Rate and Rhythm: Normal rate.  Pulmonary:     Effort: Pulmonary effort is normal.  Abdominal:     General: There is no distension.  Musculoskeletal:        General: Normal range of motion.     Cervical back: Normal range of motion.     Comments: Tender left shoulder decreased range of motion, neurovascular neurosensory intact  Skin:    General: Skin is warm.  Neurological:     General: No focal deficit present.     Mental Status: She is alert and oriented to person, place, and time.     (all labs ordered are listed, but only abnormal results are displayed) Labs Reviewed - No data to display  EKG: None  Radiology: CT Cervical Spine Wo Contrast Result Date: 12/15/2023 EXAM: CT CERVICAL SPINE WITHOUT CONTRAST 12/15/2023 05:38:53 PM TECHNIQUE: CT of the cervical spine was performed without the administration of intravenous contrast. Multiplanar reformatted images are provided for review. Automated exposure control, iterative reconstruction, and/or weight based adjustment of the mA/kV was utilized to reduce the radiation dose to as low as reasonably achievable. COMPARISON: CT cervical spine 12/09/2007 CLINICAL HISTORY: Fall in engineer, structural. FINDINGS: CERVICAL SPINE: BONES AND ALIGNMENT: No acute fracture or traumatic malalignment. DEGENERATIVE CHANGES: Cervical spondylosis with moderately large anterior vertebral osteophytes from C4 to C6. At most mild disc space narrowing. Moderate right neural foraminal stenosis at C3-C4 due to asymmetric uncovertebral spurring. No evidence  of high grade spinal canal stenosis. SOFT TISSUES: No prevertebral soft tissue swelling. IMPRESSION: 1. No acute cervical spine fracture or traumatic malalignment. Electronically signed by: Dasie Hamburg MD 12/15/2023 06:43 PM EST RP Workstation: [id number]   CT  Head Wo Contrast Result Date: 12/15/2023 EXAM: CT HEAD WITHOUT CONTRAST 12/15/2023 05:38:53 PM TECHNIQUE: CT of the head was performed without the administration of intravenous contrast. Automated exposure control, iterative reconstruction, and/or weight based adjustment of the mA/kV was utilized to reduce the radiation dose to as low as reasonably achievable. COMPARISON: CT head 08/25/2015. CLINICAL HISTORY: Fall in engineer, structural. Head pain. FINDINGS: BRAIN AND VENTRICLES: There is no evidence of an acute infarct, intracranial hemorrhage, mass, midline shift, hydrocephalus, or extra-axial fluid collection. Cerebral volume is normal. A partially empty sella is unchanged. ORBITS: No acute abnormality. SINUSES: No acute abnormality. SOFT TISSUES AND SKULL: No acute soft tissue abnormality. No skull fracture. IMPRESSION: 1. No acute intracranial abnormality. Electronically signed by: Dasie Hamburg MD 12/15/2023 06:32 PM EST RP Workstation: [id number]   DG Shoulder Left Result Date: 12/15/2023 EXAM: 1 VIEW XRAY OF THE LEFT SHOULDER 12/15/2023 05:43:00 PM COMPARISON: None available. CLINICAL HISTORY: fall FINDINGS: BONES AND JOINTS: Glenohumeral joint is normally aligned with mild degenerative changes. No acute fracture or dislocation. The [facility] joint has mild degenerative changes. SOFT TISSUES: No abnormal calcifications. Visualized lung is unremarkable. IMPRESSION: 1. No acute fracture or dislocation. Electronically signed by: Morgane Naveau MD 12/15/2023 06:21 PM EST RP Workstation: [id number]     Procedures   Medications Ordered in the ED - No data to display                                  Medical Decision Making Patient was injured when an elevator  dropped.  Patient states she hit the wall and fell down.  Patient complains of a headache from striking her head pain in her shoulder from hitting the wall.  Patient states she has soreness in her neck with movement  Amount and/or Complexity of Data Reviewed Radiology: ordered.    Details: X-ray left shoulder shows no fracture or dislocation CT head no acute intracranial abnormality. CT cervical spine no acute fracture, patient does have some osteo fights.  Risk Risk Details: Patient is counseled on the results of her x-rays.  Patient is advised Tylenol  or ibuprofen  for discomfort.  Patient is discharged in stable condition.        Final diagnoses:  Contusion of head, unspecified part of head, initial encounter  Strain of neck muscle, initial encounter    ED Discharge Orders     None      An After Visit Summary was printed and given to the patient.     Flint Sonny POUR, PA-C 12/16/23 2251    Doretha Folks, MD 12/19/23 [phone]

## 2023-12-17 ENCOUNTER — Telehealth: Admitting: Nurse Practitioner

## 2023-12-17 ENCOUNTER — Encounter: Payer: Self-pay | Admitting: Nurse Practitioner

## 2023-12-17 DIAGNOSIS — M25511 Pain in right shoulder: Secondary | ICD-10-CM | POA: Diagnosis not present

## 2023-12-17 DIAGNOSIS — G8929 Other chronic pain: Secondary | ICD-10-CM

## 2023-12-17 DIAGNOSIS — M25512 Pain in left shoulder: Secondary | ICD-10-CM

## 2023-12-17 MED ORDER — PREDNISONE 20 MG PO TABS: 20.0000 mg | ORAL_TABLET | Freq: Every day | ORAL | 0 refills | Status: DC

## 2023-12-17 MED ORDER — CYCLOBENZAPRINE HCL 10 MG PO TABS: 10.0000 mg | ORAL_TABLET | Freq: Three times a day (TID) | ORAL | 0 refills | Status: AC | PRN

## 2023-12-17 NOTE — Progress Notes (Signed)
 Virtual Visit via Video Note  I connected with Crystal Bullock on 12/17/23 at 10:40 AM EST by a video enabled telemedicine application and verified that I am speaking with the correct person using two identifiers.  Location: Patient: home Provider: office   I discussed the limitations of evaluation and management by telemedicine and the availability of in person appointments. The patient expressed understanding and agreed to proceed.  History of Present Illness:  Patient presents for video visit for follow-up visit.  She states that this past week she was on an elevator here at this facility that failed with her and it.  She states that her 2 grandchildren were with her at the time.  She was seen in the emergency room.  CT scans were overall negative.  Patient states that she is still having shoulder and neck pain.  We will order prednisone  and Flexeril . Denies f/c/s, n/v/d, hemoptysis, PND, leg swelling Denies chest pain or edema     Observations/Objective:     12/15/2023    5:07 PM 12/02/2023    8:14 AM 11/06/2023    8:28 AM  Vitals with BMI  Height  5' 7 --  Weight  245 lbs --  BMI  38.36   Systolic 158 149 --  Diastolic 85 96 --  Pulse 84 85       Assessment and Plan:  1. Chronic pain of both shoulders (Primary)  - cyclobenzaprine  (FLEXERIL ) 10 MG tablet; Take 1 tablet (10 mg total) by mouth 3 (three) times daily as needed for muscle spasms.  Dispense: 30 tablet; Refill: 0 - predniSONE  (DELTASONE ) 20 MG tablet; Take 1 tablet (20 mg total) by mouth daily with breakfast.  Dispense: 5 tablet; Refill: 0  Follow up:  Follow up as needed    I discussed the assessment and treatment plan with the patient. The patient was provided an opportunity to ask questions and all were answered. The patient agreed with the plan and demonstrated an understanding of the instructions.   The patient was advised to call back or seek an in-person evaluation if the symptoms worsen or if  the condition fails to improve as anticipated.  I provided 22 minutes of non-face-to-face time during this encounter.   Bascom GORMAN Borer, NP

## 2023-12-18 ENCOUNTER — Ambulatory Visit: Payer: Self-pay

## 2023-12-18 NOTE — Telephone Encounter (Signed)
 FYI Only or Action Required?: FYI only for provider: Home care.  Patient was last seen in primary care on 12/17/2023 by Oley Bascom RAMAN, NP.  Called Nurse Triage reporting Fall.  Symptoms began several days ago.  Interventions attempted: Rest, hydration, or home remedies.  Symptoms are: stable.  Triage Disposition: Home Care  Patient/caregiver understands and will follow disposition?: Yes Reason for Disposition  [1] Recent fall AND [2] no injury    Already seen in ED and prescribed meds from PCP, just has not picked them up from pharmacy  Answer Assessment - Initial Assessment Questions Patient was prescribed Prednisone  and Flexeril , has not picked it up, states unable to leave house from pain, son will be picking it up today for her. Patient stated yesterday having trouble sleeping because feeling like she is falling. Hx of anxiety, takes ativan  prn and finds relief with that. Tried to take it before bed and states it did not work. Advised to get medications from pharmacy and to take as prescribed, if anything worsens, changes or fails to get better to give us  a call.   1. MECHANISM: How did the fall happen?     At doctors office, elevator dropped from 3rd floor to the 1st  2. ONSET: When did the fall happen? (e.g., minutes, hours, or days ago)     Monday  3. LOCATION: What part of the body hit the ground? (e.g., back, buttocks, head, hips, knees, hands, head, stomach)     Stomach hurting, muscle spasms, shoulder, neck and back  4. PAIN: Is there any pain? If Yes, ask: How bad is the pain? (e.g., Scale 0-10; or none, mild,      Felt like can not stand up straight 10/10  5. OTHER SYMPTOMS: Do you have any other symptoms? (e.g., dizziness, fever, weakness; new-onset or worsening).      Mouth watery, nausea  Protocols used: Falls and [facility]  Copied from CRM [id number]. Topic: Clinical - Red Word Triage >> Dec 18, 2023 11:48 AM Darshell M wrote: Red Word that  prompted transfer to Nurse Triage: Had an accident on the elevator at the provider's office and in extreme pain and having difficulty sleeping due to repetitive image of falling.

## 2023-12-24 ENCOUNTER — Ambulatory Visit (HOSPITAL_COMMUNITY)
Admission: RE | Admit: 2023-12-24 | Discharge: 2023-12-24 | Disposition: A | Discharge status: Home / Self Care | Source: Ambulatory Visit | Attending: Neurology | Admitting: Neurology

## 2023-12-24 DIAGNOSIS — R519 Headache, unspecified: Secondary | ICD-10-CM | POA: Diagnosis present

## 2023-12-24 MED ORDER — GADOBUTROL 1 MMOL/ML IV SOLN
10.0000 mL | Freq: Once | INTRAVENOUS | Status: AC | PRN
Start: 2023-12-24 — End: 2023-12-24
  Administered 2023-12-24: 10 mL via INTRAVENOUS

## 2023-12-29 ENCOUNTER — Telehealth: Payer: Self-pay

## 2023-12-29 ENCOUNTER — Ambulatory Visit: Payer: Self-pay | Admitting: Neurology

## 2023-12-29 NOTE — Telephone Encounter (Signed)
 Copied from CRM #[id number]. Topic: Referral - Request for Referral >> Dec 29, 2023 12:25 PM Anairis L wrote: Did the patient discuss referral with their provider in the last year? No (If No - schedule appointment) (If Yes - send message)  Appointment offered? No  Type of order/referral and detailed reason for visit: Nightmares   Preference of office, provider, location: [facility]  If referral order, have you been seen by this specialty before? No (If Yes, this issue or another issue? When? Where?  Can we respond through MyChart? Yes

## 2023-12-30 NOTE — Telephone Encounter (Signed)
 done

## 2023-12-30 NOTE — Telephone Encounter (Signed)
 Pt was made an appt.KH

## 2023-12-30 NOTE — Telephone Encounter (Signed)
 Please call to schedule. KH

## 2024-01-01 ENCOUNTER — Telehealth (INDEPENDENT_AMBULATORY_CARE_PROVIDER_SITE_OTHER): Payer: Self-pay | Admitting: Nurse Practitioner

## 2024-01-01 DIAGNOSIS — F4310 Post-traumatic stress disorder, unspecified: Secondary | ICD-10-CM

## 2024-01-01 DIAGNOSIS — M5441 Lumbago with sciatica, right side: Secondary | ICD-10-CM

## 2024-01-01 NOTE — Progress Notes (Signed)
 Virtual Visit via Video Note  I connected with Crystal Bullock on 01/05/24 at  2:20 PM EST by a video enabled telemedicine application and verified that I am speaking with the correct person using two identifiers.  Location: Patient: home Provider: office   I discussed the limitations of evaluation and management by telemedicine and the availability of in person appointments. The patient expressed understanding and agreed to proceed.  History of Present Illness:  Patient presents today for an acute visit through video visit.  She states that she has not been sleeping and is having panic attacks due to recent fall and elevator.  We will place referral for her for psychiatry for PTSD and nightmares.  Patient does have continued back pain since the fall.  We will order an x-ray. Denies f/c/s, n/v/d, hemoptysis, PND, leg swelling Denies chest pain or edema      Observations/Objective:     12/15/2023    5:07 PM 12/02/2023    8:14 AM 11/06/2023    8:28 AM  Vitals with BMI  Height  5' 7 --  Weight  245 lbs --  BMI  38.36   Systolic 158 149 --  Diastolic 85 96 --  Pulse 84 85       Assessment and Plan:  1. PTSD (post-traumatic stress disorder) (Primary)  - Ambulatory referral to Psychiatry  2. Acute bilateral low back pain with right-sided sciatica  - DG Lumbar Spine Complete; Future     I discussed the assessment and treatment plan with the patient. The patient was provided an opportunity to ask questions and all were answered. The patient agreed with the plan and demonstrated an understanding of the instructions.   The patient was advised to call back or seek an in-person evaluation if the symptoms worsen or if the condition fails to improve as anticipated.  I provided 23 minutes of non-face-to-face time during this encounter.   Bascom GORMAN Borer, NP

## 2024-01-03 ENCOUNTER — Other Ambulatory Visit: Payer: Self-pay | Admitting: Physical Medicine & Rehabilitation

## 2024-01-03 ENCOUNTER — Other Ambulatory Visit: Payer: Self-pay | Admitting: Nurse Practitioner

## 2024-01-05 ENCOUNTER — Inpatient Hospital Stay: Payer: Self-pay | Admitting: Nurse Practitioner

## 2024-01-30 ENCOUNTER — Ambulatory Visit: Payer: Self-pay | Admitting: Nurse Practitioner

## 2024-02-02 ENCOUNTER — Other Ambulatory Visit: Payer: Self-pay | Admitting: Physical Medicine & Rehabilitation

## 2024-02-02 DIAGNOSIS — F419 Anxiety disorder, unspecified: Secondary | ICD-10-CM

## 2024-02-02 DIAGNOSIS — J309 Allergic rhinitis, unspecified: Secondary | ICD-10-CM

## 2024-02-16 ENCOUNTER — Other Ambulatory Visit: Payer: Self-pay | Admitting: Nurse Practitioner

## 2024-02-18 ENCOUNTER — Other Ambulatory Visit: Payer: Self-pay | Admitting: Nurse Practitioner

## 2024-02-18 DIAGNOSIS — K651 Peritoneal abscess: Secondary | ICD-10-CM

## 2024-02-18 MED ORDER — AMOXICILLIN-POT CLAVULANATE 875-125 MG PO TABS: 1.0000 | ORAL_TABLET | Freq: Two times a day (BID) | ORAL | 0 refills | Status: AC

## 2024-02-18 MED ORDER — CLONAZEPAM 0.5 MG PO TABS: 0.2500 mg | ORAL_TABLET | Freq: Two times a day (BID) | ORAL | 0 refills | Status: AC | PRN

## 2024-02-26 ENCOUNTER — Telehealth: Admitting: Nurse Practitioner

## 2024-02-26 ENCOUNTER — Ambulatory Visit: Admitting: Nurse Practitioner

## 2024-02-26 DIAGNOSIS — J069 Acute upper respiratory infection, unspecified: Secondary | ICD-10-CM

## 2024-02-26 DIAGNOSIS — J4520 Mild intermittent asthma, uncomplicated: Secondary | ICD-10-CM

## 2024-02-26 DIAGNOSIS — J309 Allergic rhinitis, unspecified: Secondary | ICD-10-CM

## 2024-02-26 DIAGNOSIS — E559 Vitamin D deficiency, unspecified: Secondary | ICD-10-CM

## 2024-02-26 DIAGNOSIS — E119 Type 2 diabetes mellitus without complications: Secondary | ICD-10-CM

## 2024-02-26 DIAGNOSIS — J209 Acute bronchitis, unspecified: Secondary | ICD-10-CM

## 2024-02-26 DIAGNOSIS — D869 Sarcoidosis, unspecified: Secondary | ICD-10-CM

## 2024-02-26 MED ORDER — TRIAMCINOLONE ACETONIDE 0.1 % EX CREA: TOPICAL_CREAM | Freq: Two times a day (BID) | CUTANEOUS | 0 refills | Status: AC

## 2024-02-26 MED ORDER — BENZONATATE 200 MG PO CAPS: 200.0000 mg | ORAL_CAPSULE | Freq: Three times a day (TID) | ORAL | 0 refills | Status: DC | PRN

## 2024-02-26 MED ORDER — CETIRIZINE HCL 10 MG PO TABS: ORAL_TABLET | ORAL | 2 refills | Status: AC

## 2024-02-26 MED ORDER — AZITHROMYCIN 250 MG PO TABS: ORAL_TABLET | ORAL | 0 refills | Status: AC

## 2024-02-26 MED ORDER — ALBUTEROL SULFATE HFA 108 (90 BASE) MCG/ACT IN AERS: INHALATION_SPRAY | RESPIRATORY_TRACT | 1 refills | Status: AC

## 2024-02-26 MED ORDER — BLOOD GLUCOSE TEST VI STRP: 1.0000 | ORAL_STRIP | Freq: Two times a day (BID) | 0 refills | Status: AC

## 2024-02-26 MED ORDER — FLUTICASONE PROPIONATE 50 MCG/ACT NA SUSP: 2.0000 | Freq: Every day | NASAL | 3 refills | Status: AC | PRN

## 2024-02-26 MED ORDER — VITAMIN D (ERGOCALCIFEROL) 1.25 MG (50000 UNIT) PO CAPS: 50000.0000 [IU] | ORAL_CAPSULE | ORAL | 6 refills | Status: AC

## 2024-02-26 MED ORDER — ALBUTEROL SULFATE (2.5 MG/3ML) 0.083% IN NEBU: INHALATION_SOLUTION | RESPIRATORY_TRACT | 1 refills | Status: AC

## 2024-02-26 NOTE — Progress Notes (Signed)
 Virtual Visit via Video Note  I connected with Crystal Bullock on 02/26/24 at  9:40 AM EST by a video enabled telemedicine application and verified that I am speaking with the correct person using two identifiers.  Location: Patient: home Provider: office   I discussed the limitations of evaluation and management by telemedicine and the availability of in person appointments. The patient expressed understanding and agreed to proceed.  History of Present Illness:  Patient presents today through video visit for an acute visit.  She states that since Monday she has been having head congestion, chest congestion, sinus pressure and pain, postnasal drip, cough.  She has tried over-the-counter cold medications with minimal relief noted.  She states that symptoms are progressively worsening.  We will prescribe Floropryl this will also.  Patient does need refills on medications.  We discussed that she does need to return to the office once she starts feeling better for an in office follow-up visit for chronic conditions. Denies f/c/s, n/v/d, hemoptysis, PND, leg swelling Denies chest pain or edema    Observations/Objective:     12/15/2023    5:07 PM 12/02/2023    8:14 AM 11/06/2023    8:28 AM  Vitals with BMI  Height  5' 7 --  Weight  245 lbs --  BMI  38.36   Systolic 158 149 --  Diastolic 85 96 --  Pulse 84 85       Assessment and Plan:  1. Type 2 diabetes mellitus without complication, without long-term current use of insulin (HCC) (Primary)  - Glucose Blood (BLOOD GLUCOSE TEST STRIPS) STRP; 1 each by In Vitro route 2 (two) times daily. May substitute to any manufacturer covered by patient's insurance.  Dispense: 100 strip; Refill: 0  2. Sarcoidosis  - albuterol  (PROVENTIL ) (2.5 MG/3ML) 0.083% nebulizer solution; USE 1 VIAL VIA NEBULIZER EVERY 6 HOURS AS NEEDED FOR WHEEZING OR SHORTNESS OF BREATH  Dispense: 75 mL; Refill: 1 - albuterol  (VENTOLIN  HFA) 108 (90 Base) MCG/ACT  inhaler; INHALE 2 PUFFS INTO THE LUNGS EVERY 6 HOURS FOR UP TO 90 DOSES AS NEEDED FOR WHEEZING  Dispense: 6.7 each; Refill: 1  3. Mild intermittent asthma without complication  - albuterol  (PROVENTIL ) (2.5 MG/3ML) 0.083% nebulizer solution; USE 1 VIAL VIA NEBULIZER EVERY 6 HOURS AS NEEDED FOR WHEEZING OR SHORTNESS OF BREATH  Dispense: 75 mL; Refill: 1 - albuterol  (VENTOLIN  HFA) 108 (90 Base) MCG/ACT inhaler; INHALE 2 PUFFS INTO THE LUNGS EVERY 6 HOURS FOR UP TO 90 DOSES AS NEEDED FOR WHEEZING  Dispense: 6.7 each; Refill: 1  4. Allergic rhinitis, unspecified seasonality, unspecified trigger  - cetirizine  (ZYRTEC ) 10 MG tablet; TAKE 1 TABLET BY MOUTH DAILY AS NEEDED FOR ALLERGIES  Dispense: 30 tablet; Refill: 2 - fluticasone  (FLONASE ) 50 MCG/ACT nasal spray; Place 2 sprays into both nostrils daily as needed. allergies  Dispense: 48 mL; Refill: 3  5. Vitamin D  deficiency  - Vitamin D , Ergocalciferol , (DRISDOL ) 1.25 MG (50000 UNIT) CAPS capsule; Take 1 capsule (50,000 Units total) by mouth every 7 (seven) days.  Dispense: 5 capsule; Refill: 6  URI:  Azithromycin   Tessalon    Stay well hydrated  Stay active  Deep breathing exercises  May take tylenol  or fever or pain  May take mucinex DM twice daily       I discussed the assessment and treatment plan with the patient. The patient was provided an opportunity to ask questions and all were answered. The patient agreed with the plan and demonstrated an understanding  of the instructions.   The patient was advised to call back or seek an in-person evaluation if the symptoms worsen or if the condition fails to improve as anticipated.  I provided 23 minutes of non-face-to-face time during this encounter.   Bascom GORMAN Borer, NP

## 2024-03-01 ENCOUNTER — Ambulatory Visit (HOSPITAL_COMMUNITY)

## 2024-03-01 DIAGNOSIS — F4310 Post-traumatic stress disorder, unspecified: Secondary | ICD-10-CM

## 2024-03-01 DIAGNOSIS — F411 Generalized anxiety disorder: Secondary | ICD-10-CM
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
## 2024-03-02 NOTE — Patient Instructions (Signed)
 SABRA

## 2024-03-09 ENCOUNTER — Ambulatory Visit (HOSPITAL_COMMUNITY)

## 2024-03-09 ENCOUNTER — Encounter (HOSPITAL_COMMUNITY): Payer: Self-pay

## 2024-03-16 ENCOUNTER — Ambulatory Visit: Payer: Self-pay | Admitting: Nurse Practitioner

## 2024-03-16 DIAGNOSIS — J069 Acute upper respiratory infection, unspecified: Secondary | ICD-10-CM

## 2024-03-16 NOTE — Telephone Encounter (Signed)
 This RN made second attempt to triage patient. No answer, LVM. Routing for additional attempts.

## 2024-03-16 NOTE — Telephone Encounter (Signed)
 This Triage RN made 3rd attempt to reach patient, call goes to voicemail.  Left callback number and will return to call backs for final attempt.       Copied from CRM [id number]. Topic: Clinical - Medical Advice >> Mar 16, 2024  3:25 PM Sophia H wrote: Reason for CRM: Patient is wanting to know if provider can send in prednisone  20MG  tablets & azithromycin  (ZITHROMAX ) 250 MG -   has a cough, no discolored phlegm. States believes it might be an upper respiratory infection that is starting, please advise. Declined scheduling in at this time.    [id number] - [place], [place] - [address] OF [facility] [address]

## 2024-03-16 NOTE — Telephone Encounter (Signed)
 FYI Only or Action Required?: Action required by provider: clinical question for provider and update on patient condition.  Patient was last seen in primary care on 02/26/2024 by Crystal Bascom RAMAN, NP.  Called Nurse Triage reporting Cough.  Symptoms began a week ago.  Interventions attempted: Prescription medications: Zpak; prednisone .  Symptoms are: unchanged.  Triage Disposition: Call PCP Within 24 Hours  Patient/caregiver understands and will follow disposition?: Yes     Copied from CRM #[id number]. Topic: Clinical - Medical Advice >> Mar 16, 2024  3:25 PM Crystal Bullock wrote: Reason for CRM: Patient is wanting to know if provider can send in prednisone  20MG  tablets & azithromycin  (ZITHROMAX ) 250 MG - has a cough, no discolored phlegm. States believes it might be an upper respiratory infection that is starting, please advise. Declined scheduling in at this time.   [id number] - [place], [place] - [address] OF [facility] [address]   Reason for Disposition  [1] Taking antibiotic > 72 hours (3 days) AND [2] symptoms (other than fever) not improved  Answer Assessment - Initial Assessment Questions Pt called to request rx refill for prednisone , Zpak and Tessalon . Pt was seen for bronchitis via virtual telehealth 02/26/24 with PCP. Pt states her grandchildren have also been sick so she has not gotten better. Pt states she also misread directions for Zpak and took 1 pill daily x 6 days. Pt denies any new symptoms but is not getting better. Pharmacy confirmed and rx pending.      1. INFECTION: What infection is the antibiotic being given for?     Bronchitis   2. ANTIBIOTIC: What antibiotic are you taking How many times per day?     Zithromax    3. DURATION: When was the antibiotic started?     01/15  4. MAIN CONCERN OR SYMPTOM:  What is your main concern right now?     Pt states cough has continued and she missed directions on rx so she took one pill  daily x 6 days opposed to taking 2 pills on day 1  5. BETTER-SAME-WORSE: Are you getting better, staying the same, or getting worse compared to when you first started the antibiotics? If getting worse, ask: In what way?      Not getting better; cough still present without discolored phlegm, no fever  6. FEVER: Do you have a fever? If Yes, ask: What is your temperature, how was it measured, and when did it start?     No   7. SYMPTOMS: Are there any other symptoms you're concerned about? If Yes, ask: When did it start?     No new symptoms   8. FOLLOW-UP APPOINTMENT: Do you have a follow-up appointment with your doctor?     03/29/24  Protocols used: Infection on Antibiotic Follow-up Call-A-[facility]

## 2024-03-16 NOTE — Telephone Encounter (Signed)
 First attempt to contact pt (attempted call x2, LVM x1), no ringing, no answer, LVM for call back to PCP office. Placed in call back.      Copied from CRM [id number]. Topic: Clinical - Medical Advice >> Mar 16, 2024  3:25 PM Sophia H wrote: Reason for CRM: Patient is wanting to know if provider can send in prednisone  20MG  tablets & azithromycin  (ZITHROMAX ) 250 MG - has a cough, no discolored phlegm. States believes it might be an upper respiratory infection that is starting, please advise. Declined scheduling in at this time.   [id number] - [place], [place] - [address] [facility] [address]

## 2024-03-17 ENCOUNTER — Other Ambulatory Visit: Payer: Self-pay | Admitting: Nurse Practitioner

## 2024-03-17 DIAGNOSIS — G8929 Other chronic pain: Secondary | ICD-10-CM

## 2024-03-17 DIAGNOSIS — J069 Acute upper respiratory infection, unspecified: Secondary | ICD-10-CM

## 2024-03-17 MED ORDER — AZITHROMYCIN 250 MG PO TABS: ORAL_TABLET | ORAL | 0 refills | Status: AC

## 2024-03-17 MED ORDER — BENZONATATE 200 MG PO CAPS: 200.0000 mg | ORAL_CAPSULE | Freq: Three times a day (TID) | ORAL | 0 refills | Status: AC | PRN

## 2024-03-17 MED ORDER — PREDNISONE 20 MG PO TABS: 20.0000 mg | ORAL_TABLET | Freq: Every day | ORAL | 0 refills | Status: AC

## 2024-03-17 NOTE — Telephone Encounter (Signed)
 Pt has been advised that prescriptions were sent as requested. She voiced her understanding and states that she is about to go pick it up now. CB.

## 2024-03-17 NOTE — Telephone Encounter (Signed)
 Message from Deaijah H sent at 03/17/2024  3:46 PM EST  Reason for Triage: Patient called in to follow up on refills for prednisone  20MG  tablets, benzonatate  (TESSALON ) 200 MG capsule & azithromycin  (ZITHROMAX ) 250 MG to cehck if they have been sent in advised Id o not show they have.Requested nurse due to her Starting to feel worse than yesterday, cough and congestion not coming up.  Pt advised provider has not had a chance to see note from yesterday.

## 2024-03-17 NOTE — Telephone Encounter (Signed)
 Please advise on prednisone  20MG  tablets & azithromycin  (ZITHROMAX ) 250 MG refill as requested by patient. CB.

## 2024-03-17 NOTE — Telephone Encounter (Signed)
 Message from Deaijah H sent at 03/17/2024  3:46 PM EST  Reason for Triage: Patient called in to follow up on refills for prednisone  20MG  tablets, benzonatate  (TESSALON ) 200 MG capsule & azithromycin  (ZITHROMAX ) 250 MG to cehck if they have been sent in advised Id o not show they have.Requested nurse due to her Starting to feel worse than yesterday, cough and congestion not coming up.   This encounter was created in error - please disregard.

## 2024-03-17 NOTE — Telephone Encounter (Signed)
 Called [facility] and spoke with  Macario  and advised her of pt calling back to  f/u for prescription request for Z pack and prednisone  and benzonatate . Macario stated she send message to provider and assistant.

## 2024-03-18 ENCOUNTER — Encounter (HOSPITAL_COMMUNITY): Payer: Self-pay

## 2024-03-18 ENCOUNTER — Ambulatory Visit (HOSPITAL_COMMUNITY)

## 2024-03-25 ENCOUNTER — Ambulatory Visit (HOSPITAL_COMMUNITY)

## 2024-03-29 ENCOUNTER — Ambulatory Visit: Payer: Self-pay | Admitting: Nurse Practitioner

## 2024-06-03 ENCOUNTER — Ambulatory Visit: Admitting: Dermatology
